# Patient Record
Sex: Female | Born: 1950 | Race: Black or African American | Hispanic: No | Smoking: Never smoker
Health system: Southern US, Community
[De-identification: ages and names within clinical notes are randomized; demographics above are authoritative.]

## PROBLEM LIST (undated history)

## (undated) DIAGNOSIS — I639 Cerebral infarction, unspecified: Secondary | ICD-10-CM

## (undated) DIAGNOSIS — G4730 Sleep apnea, unspecified: Secondary | ICD-10-CM

## (undated) DIAGNOSIS — E119 Type 2 diabetes mellitus without complications: Secondary | ICD-10-CM

## (undated) DIAGNOSIS — L0390 Cellulitis, unspecified: Secondary | ICD-10-CM

## (undated) DIAGNOSIS — I10 Essential (primary) hypertension: Secondary | ICD-10-CM

## (undated) DIAGNOSIS — W19XXXA Unspecified fall, initial encounter: Secondary | ICD-10-CM

## (undated) DIAGNOSIS — N6019 Diffuse cystic mastopathy of unspecified breast: Secondary | ICD-10-CM

## (undated) DIAGNOSIS — I6529 Occlusion and stenosis of unspecified carotid artery: Secondary | ICD-10-CM

## (undated) HISTORY — DX: Sleep apnea, unspecified: G47.30

## (undated) HISTORY — DX: Unspecified fall, initial encounter: W19.XXXA

## (undated) HISTORY — PX: CATARACT EXTRACTION, BILATERAL: SHX1313

## (undated) HISTORY — PX: APPENDECTOMY: SHX54

## (undated) HISTORY — PX: EYE SURGERY: SHX253

## (undated) HISTORY — DX: Essential (primary) hypertension: I10

## (undated) HISTORY — DX: Diffuse cystic mastopathy of unspecified breast: N60.19

## (undated) HISTORY — PX: REFRACTIVE SURGERY: SHX103

## (undated) HISTORY — DX: Cellulitis, unspecified: L03.90

## (undated) HISTORY — PX: OTHER SURGICAL HISTORY: SHX169

## (undated) HISTORY — PX: TUBAL LIGATION: SHX77

## (undated) HISTORY — DX: Occlusion and stenosis of unspecified carotid artery: I65.29

## (undated) HISTORY — DX: Type 2 diabetes mellitus without complications: E11.9

## (undated) HISTORY — PX: BREAST EXCISIONAL BIOPSY: SUR124

---

## 2002-10-08 DIAGNOSIS — I10 Essential (primary) hypertension: Secondary | ICD-10-CM

## 2002-10-08 HISTORY — DX: Essential (primary) hypertension: I10

## 2004-10-08 HISTORY — PX: BIOPSY THYROID: PRO38

## 2005-02-20 ENCOUNTER — Ambulatory Visit: Payer: Self-pay | Admitting: Otolaryngology

## 2005-05-02 ENCOUNTER — Ambulatory Visit: Payer: Self-pay | Admitting: Otolaryngology

## 2005-11-07 ENCOUNTER — Other Ambulatory Visit: Payer: Self-pay

## 2005-11-07 ENCOUNTER — Inpatient Hospital Stay: Payer: Self-pay | Admitting: Internal Medicine

## 2005-11-19 ENCOUNTER — Ambulatory Visit: Payer: Self-pay | Admitting: Family Medicine

## 2005-12-19 ENCOUNTER — Encounter: Payer: Self-pay | Admitting: Family Medicine

## 2006-01-06 ENCOUNTER — Encounter: Payer: Self-pay | Admitting: Family Medicine

## 2006-02-05 ENCOUNTER — Encounter: Payer: Self-pay | Admitting: Family Medicine

## 2006-07-18 ENCOUNTER — Ambulatory Visit: Payer: Self-pay | Admitting: *Deleted

## 2006-10-08 HISTORY — PX: ROTATOR CUFF REPAIR: SHX139

## 2006-11-11 ENCOUNTER — Ambulatory Visit: Payer: Self-pay | Admitting: General Surgery

## 2006-12-19 ENCOUNTER — Ambulatory Visit: Payer: Self-pay | Admitting: Family Medicine

## 2007-01-02 ENCOUNTER — Inpatient Hospital Stay: Payer: Self-pay | Admitting: Internal Medicine

## 2007-01-02 ENCOUNTER — Ambulatory Visit: Payer: Self-pay | Admitting: *Deleted

## 2007-01-13 ENCOUNTER — Ambulatory Visit: Payer: Self-pay | Admitting: *Deleted

## 2007-03-09 LAB — HM COLONOSCOPY: HM Colonoscopy: NORMAL

## 2007-04-17 ENCOUNTER — Ambulatory Visit: Payer: Self-pay | Admitting: Specialist

## 2007-04-24 ENCOUNTER — Ambulatory Visit: Payer: Self-pay | Admitting: Specialist

## 2007-11-13 ENCOUNTER — Ambulatory Visit: Payer: Self-pay | Admitting: General Surgery

## 2008-02-04 ENCOUNTER — Ambulatory Visit: Payer: Self-pay | Admitting: Obstetrics and Gynecology

## 2008-03-10 ENCOUNTER — Ambulatory Visit: Payer: Self-pay | Admitting: Gastroenterology

## 2008-05-14 ENCOUNTER — Emergency Department: Payer: Self-pay | Admitting: Emergency Medicine

## 2008-07-18 ENCOUNTER — Encounter: Admission: RE | Admit: 2008-07-18 | Discharge: 2008-07-18 | Payer: Self-pay | Admitting: Orthopedic Surgery

## 2008-08-16 ENCOUNTER — Ambulatory Visit (HOSPITAL_COMMUNITY): Admission: RE | Admit: 2008-08-16 | Discharge: 2008-08-16 | Payer: Self-pay | Admitting: Orthopedic Surgery

## 2008-12-14 ENCOUNTER — Ambulatory Visit: Payer: Self-pay | Admitting: General Surgery

## 2009-04-21 ENCOUNTER — Ambulatory Visit (HOSPITAL_COMMUNITY): Admission: RE | Admit: 2009-04-21 | Discharge: 2009-04-21 | Payer: Self-pay | Admitting: Orthopedic Surgery

## 2009-06-30 ENCOUNTER — Inpatient Hospital Stay: Payer: Self-pay | Admitting: Internal Medicine

## 2009-07-12 ENCOUNTER — Encounter: Payer: Self-pay | Admitting: Internal Medicine

## 2009-07-29 ENCOUNTER — Emergency Department: Payer: Self-pay | Admitting: Emergency Medicine

## 2009-08-08 HISTORY — PX: KNEE SURGERY: SHX244

## 2009-10-08 HISTORY — PX: COLONOSCOPY: SHX174

## 2009-12-19 ENCOUNTER — Ambulatory Visit: Payer: Self-pay | Admitting: General Surgery

## 2010-04-07 HISTORY — PX: KNEE SURGERY: SHX244

## 2010-12-26 ENCOUNTER — Ambulatory Visit: Payer: Self-pay | Admitting: General Surgery

## 2011-01-14 LAB — URINALYSIS, ROUTINE W REFLEX MICROSCOPIC
Bilirubin Urine: NEGATIVE
Glucose, UA: NEGATIVE mg/dL
Nitrite: NEGATIVE
Protein, ur: NEGATIVE mg/dL
Specific Gravity, Urine: 1.012 (ref 1.005–1.030)
pH: 5 (ref 5.0–8.0)

## 2011-01-14 LAB — PROTIME-INR
INR: 1.1 (ref 0.00–1.49)
Prothrombin Time: 14.1 seconds (ref 11.6–15.2)

## 2011-01-14 LAB — GLUCOSE, CAPILLARY
Glucose-Capillary: 116 mg/dL — ABNORMAL HIGH (ref 70–99)
Glucose-Capillary: 117 mg/dL — ABNORMAL HIGH (ref 70–99)
Glucose-Capillary: 163 mg/dL — ABNORMAL HIGH (ref 70–99)

## 2011-01-14 LAB — CBC
Hemoglobin: 13.1 g/dL (ref 12.0–15.0)
MCV: 91.2 fL (ref 78.0–100.0)
RDW: 13 % (ref 11.5–15.5)

## 2011-01-14 LAB — DIFFERENTIAL
Basophils Relative: 1 % (ref 0–1)
Monocytes Relative: 7 % (ref 3–12)
Neutro Abs: 5.2 10*3/uL (ref 1.7–7.7)

## 2011-01-14 LAB — COMPREHENSIVE METABOLIC PANEL
ALT: 19 U/L (ref 0–35)
BUN: 12 mg/dL (ref 6–23)
Creatinine, Ser: 0.81 mg/dL (ref 0.4–1.2)
GFR calc Af Amer: >60 mL/min (ref >60)
GFR calc non Af Amer: >60 mL/min (ref >60)
Sodium: 139 mEq/L (ref 135–145)
Total Bilirubin: 0.9 mg/dL (ref 0.3–1.2)

## 2011-01-14 LAB — APTT: aPTT: 28 seconds (ref 24–37)

## 2011-02-20 NOTE — Op Note (Signed)
NAMECYAN, CLIPPINGER                 ACCOUNT NO.:  [id number]   MEDICAL RECORD NO.:  [id number]          PATIENT TYPE:  AMB   LOCATION:  SDS                          FACILITY:  [facility]   PHYSICIAN:  Mila Homer. Sherlean Foot, M.D. DATE OF BIRTH:  1951/02/13   DATE OF PROCEDURE:  08/16/2008  DATE OF DISCHARGE:  08/16/2008                               OPERATIVE REPORT   SURGEON:  Mila Homer. Sherlean Foot, MD   ASSISTANT:  Altamese Cabal, PA-C   ANESTHESIA:  MAC.   PREOPERATIVE DIAGNOSIS:  Left knee meniscus tear and osteoarthritis.   POSTOPERATIVE DIAGNOSIS:  Left knee meniscus tear and osteoarthritis.   PROCEDURE:  Left knee partial medial meniscectomy and chondroplasty in  the patellofemoral compartment.   INDICATIONS FOR PROCEDURE:  The patient is 60 year old white female with  mechanical symptoms MRI evidence of meniscus tearing.  Informed consent  was obtained.   DESCRIPTION OF PROCEDURE:  The patient was laid supine, administered MAC  anesthesia, and left leg prepped and draped in the usual sterile  fashion.  Inferolateral and inferomedial portals were created with #11  blade, blunt trocar, and cannula.  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  Chondroplasty was performed with a motorized  shaver.  I then went to flexion.  The medial femoral condyle had some  grade 1 chondromalacia.  There was a complex tear at the posterior horn  of the medial meniscus.  Straight basket forceps and [place] shaver  were used to perform partial medial meniscectomy.  ACL and PCL were  normal.  In the figure-of-4 position we investigated the lateral  compartment, it was normal except for just some fraying degenerative  tearing of the lateral meniscus, which was removed.  I then lavaged and  closed with 4-0 nylon sutures.  Dressed with Xeroform dressing sponges,  sterile Webril, and Ace wrap.   COMPLICATIONS:  None.   DRAINS:  None.           ______________________________  Mila Homer. Sherlean Foot, M.D.     SDL/MEDQ  D:  08/16/2008  T:  08/17/2008  Job:  [id number]

## 2011-02-20 NOTE — Op Note (Signed)
Meagan [name], Meagan [name]                 ACCOUNT NO.:  [id number]   MEDICAL RECORD NO.:  [id number]          PATIENT TYPE:  AMB   LOCATION:  SDS                          FACILITY:  [facility]   PHYSICIAN:  Mila Homer. Sherlean Foot, M.D. DATE OF BIRTH:  1951-04-15   DATE OF PROCEDURE:  04/21/2009  DATE OF DISCHARGE:                               OPERATIVE REPORT   SURGEON:  Mila Homer. Sherlean Foot, MD   ASSISTANT:  None.   ANESTHESIA:  MAC.   PREOPERATIVE DIAGNOSES:  Right knee osteoarthritis and medial and  lateral meniscus tears.   POSTOPERATIVE DIAGNOSES:  Right knee osteoarthritis and medial and  lateral meniscus tears.   INDICATIONS FOR PROCEDURE:  The patient is a 60 year old black female  with mechanical symptoms.  MRI evidence of meniscal tear and  radiographic evidence of OA.  Informed consent obtained.   DESCRIPTION OF PROCEDURE:  The patient was laid supine, administered MAC  anesthesia, right leg prepped and draped in usual sterile fashion.  Inferolateral and inferomedial portals were created with #11 blade,  blunt trocar, and cannula.  Diagnostic arthroscopy revealed  chondromalacia of the patella grade 1 went into the medial compartment,  she had 1 area in the weightbearing surface of grade 3 and 4  chondromalacia with unstable articular cartilage.  An aggressive  chondroplasty was performed with Gator Large shaver.  I then performed a  microfracture with 45-degree microfracture awl.  The bone was very very  soft, and I could actually put it an half of the drill holes just  pushing by hand.  There was tearing of the posterior horn of the  meniscus.  Straight basket forceps and Gator Large shaver were used to  perform an aggressive partial medial meniscectomy.  I then went to the  notch, ACL and PCL were normal.  Then went to the lateral compartment,  there was only some grade 1 chondromalacia on the tibia.  There was  degenerative tearing throughout the entirety of the lateral meniscus.   I  cleaned this up by performing a partial lateral meniscectomy with  straight basket forceps and Gator Large  shaver.  I then lavaged and  closed with 4-0 nylon sutures, dressed with Xeroform dressing, sponges,  sterile Webril, and Ace wrap.   COMPLICATIONS:  None.   DRAINS:  None.           ______________________________  Mila Homer. Sherlean Foot, M.D.     SDL/MEDQ  D:  04/21/2009  T:  04/22/2009  Job:  [id number]

## 2011-02-23 NOTE — Op Note (Signed)
NAMETIFANY, HIRSCH                 ACCOUNT NO.:  [id number]   MEDICAL RECORD NO.:  [id number]          PATIENT TYPE:  AMB   LOCATION:  SDS                          FACILITY:  [facility]   PHYSICIAN:  Mila Homer. Sherlean Foot, M.D. DATE OF BIRTH:  Feb 01, 1951   DATE OF PROCEDURE:  DATE OF DISCHARGE:  08/16/2008                               OPERATIVE REPORT   ADDENDUM:  In the operative report, it states in the indications for procedure that  this is a 60 year old white female and in fact this is a 60 year old  black female patient, wanted this change.  Please go into the record and  change it and everything else is same.           ______________________________  Mila Homer Sherlean Foot, M.D.     SDL/MEDQ  D:  11/09/2008  T:  11/10/2008  Job:  [id number]

## 2011-07-10 LAB — COMPREHENSIVE METABOLIC PANEL
Alkaline Phosphatase: 83
GFR calc Af Amer: >60
GFR calc non Af Amer: >60
Potassium: 3.9

## 2011-07-10 LAB — URINALYSIS, ROUTINE W REFLEX MICROSCOPIC
Bilirubin Urine: NEGATIVE
Nitrite: NEGATIVE
Protein, ur: NEGATIVE
Urobilinogen, UA: 1

## 2011-07-10 LAB — DIFFERENTIAL
Basophils Relative: 1
Eosinophils Absolute: 0.2
Eosinophils Relative: 2
Lymphocytes Relative: 26
Monocytes Relative: 7
Neutro Abs: 6.7
Neutrophils Relative %: 64

## 2011-07-10 LAB — GLUCOSE, CAPILLARY: Glucose-Capillary: 119 — ABNORMAL HIGH

## 2011-07-10 LAB — CBC
HCT: 37.5
Platelets: 384
RDW: 13.6

## 2011-10-09 DIAGNOSIS — W19XXXA Unspecified fall, initial encounter: Secondary | ICD-10-CM

## 2011-10-09 HISTORY — DX: Unspecified fall, initial encounter: W19.XXXA

## 2011-12-27 ENCOUNTER — Ambulatory Visit: Payer: Self-pay | Admitting: General Surgery

## 2012-01-02 ENCOUNTER — Ambulatory Visit: Payer: Self-pay | Admitting: Family Medicine

## 2012-04-28 LAB — HM PAP SMEAR: HM Pap smear: NORMAL

## 2012-09-28 ENCOUNTER — Emergency Department: Payer: Self-pay | Admitting: Emergency Medicine

## 2012-11-16 ENCOUNTER — Encounter: Payer: Self-pay | Admitting: *Deleted

## 2012-11-16 DIAGNOSIS — I10 Essential (primary) hypertension: Secondary | ICD-10-CM | POA: Insufficient documentation

## 2012-11-16 DIAGNOSIS — I779 Disorder of arteries and arterioles, unspecified: Secondary | ICD-10-CM | POA: Insufficient documentation

## 2012-11-16 DIAGNOSIS — I6529 Occlusion and stenosis of unspecified carotid artery: Secondary | ICD-10-CM | POA: Insufficient documentation

## 2012-11-16 DIAGNOSIS — L0390 Cellulitis, unspecified: Secondary | ICD-10-CM | POA: Insufficient documentation

## 2013-01-15 ENCOUNTER — Ambulatory Visit: Payer: Self-pay | Admitting: General Surgery

## 2013-01-22 ENCOUNTER — Ambulatory Visit: Payer: Self-pay | Admitting: General Surgery

## 2013-01-26 ENCOUNTER — Encounter: Payer: Self-pay | Admitting: *Deleted

## 2013-01-26 ENCOUNTER — Ambulatory Visit: Payer: Self-pay | Admitting: General Surgery

## 2013-01-27 ENCOUNTER — Encounter: Payer: Self-pay | Admitting: General Surgery

## 2013-01-27 NOTE — Progress Notes (Signed)
Quick Note:  Make sure the additional views are done prior to her office visit ______ 

## 2013-01-28 NOTE — Progress Notes (Signed)
Patient had additional views completed at [facility] on 01-26-13.

## 2013-01-29 ENCOUNTER — Ambulatory Visit (INDEPENDENT_AMBULATORY_CARE_PROVIDER_SITE_OTHER): Payer: BC Managed Care – PPO | Admitting: General Surgery

## 2013-01-29 ENCOUNTER — Encounter: Payer: Self-pay | Admitting: General Surgery

## 2013-01-29 ENCOUNTER — Other Ambulatory Visit: Payer: Self-pay | Admitting: *Deleted

## 2013-01-29 VITALS — BP 122/82 | HR 76 | Resp 12 | Ht 64.0 in | Wt 234.0 lb

## 2013-01-29 DIAGNOSIS — Z1231 Encounter for screening mammogram for malignant neoplasm of breast: Secondary | ICD-10-CM

## 2013-01-29 DIAGNOSIS — N6019 Diffuse cystic mastopathy of unspecified breast: Secondary | ICD-10-CM

## 2013-01-29 DIAGNOSIS — I6529 Occlusion and stenosis of unspecified carotid artery: Secondary | ICD-10-CM

## 2013-01-29 NOTE — Patient Instructions (Addendum)
Return for here carotid u/s tomorrow

## 2013-01-29 NOTE — Progress Notes (Signed)
Patient will be asked to return to the office in one year for a bilateral screening mammogram. 

## 2013-01-29 NOTE — Progress Notes (Signed)
Patient ID: Meagan [name], female   DOB: 02-Mar-1951, 62 y.o.   MRN: [id number]  Chief Complaint  Patient presents with  . Other    mammogram    HP Meagan [name] is a 62 y.o. female who presents for follow up mammogram on 01/07/13 cat 2..Patient states no new breast problems. She also has mild asymptomatic carotid stenosis. HPI  Past Medical History  Diagnosis Date  . Occlusion and stenosis of carotid artery without mention of cerebral infarction   . Hypertension 2004  . Diffuse cystic mastopathy   . Cellulitis     left leg  . 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 Knee surgery Right 2010  . Rotator cuff repair  2008  . Breast biopsy    . Carpal tunnel release    . Tubal ligation    . Ganglion cyst removal     . Appendectomy    . Colonoscopy  2011    DR.WOHL    History reviewed. No pertinent family history.  Social History History  Substance Use Topics  . Smoking status: Never Smoker   . Smokeless tobacco: Never Used  . Alcohol Use: No    Allergies  Allergen Reactions  . Keflex (Cephalexin) Rash    Current Outpatient Prescriptions  Medication Sig Dispense Refill  . aspirin 81 MG tablet Take 81 mg by mouth daily.      . pravastatin (PRAVACHOL) 20 MG tablet Take 20 mg by mouth daily.      Marland Kitchen telmisartan (MICARDIS) 20 MG tablet Take 20 mg by mouth daily.      Marland Kitchen MICARDIS HCT 80-25 MG per tablet Take 80 tablets by mouth daily.       No current facility-administered medications for this visit.    Review of Systems Review of Systems  Constitutional: Negative.   Respiratory: Negative.   Cardiovascular: Negative.   Gastrointestinal: Negative.     Blood pressure 122/82, pulse 76, resp. rate 12, height 5\' 4"  (1.626 m), weight 234 lb (106.142 kg).  Physical Exam Physical Exam  Constitutional: She is oriented to person, place, and time. She appears well-developed and well-nourished.   Neck: Normal range of motion. Neck supple. Carotid bruit is not present.  Cardiovascular: Normal rate, regular rhythm and normal heart sounds.   Pulses:      Carotid pulses are 2+ on the right side, and 2+ on the left side.      Dorsalis pedis pulses are 2+ on the right side, and 2+ on the left side.       Posterior tibial pulses are 2+ on the right side, and 2+ on the left side.  No leg edema  Pulmonary/Chest: Effort normal and breath sounds normal. Right breast exhibits no inverted nipple, no mass, no nipple discharge, no skin change and no tenderness. Left breast exhibits no inverted nipple, no mass, no nipple discharge, no skin change and no tenderness.  Abdominal: Soft. Bowel sounds are normal.  Lymphadenopathy:    She has no cervical adenopathy.    She has no axillary adenopathy.  Neurological: She is alert and oriented to person, place, and time.    Data Reviewed Mammogram reviewed-BIRADS 2  Assessment    Stable exam.     Plan    2yr f/u with mammogram.        Meagan [name] G 01/30/2013, 6:34 AM

## 2013-01-30 ENCOUNTER — Encounter: Payer: Self-pay | Admitting: General Surgery

## 2013-01-30 ENCOUNTER — Ambulatory Visit (INDEPENDENT_AMBULATORY_CARE_PROVIDER_SITE_OTHER): Payer: BC Managed Care – PPO | Admitting: General Surgery

## 2013-01-30 DIAGNOSIS — I6529 Occlusion and stenosis of unspecified carotid artery: Secondary | ICD-10-CM

## 2013-01-30 NOTE — Patient Instructions (Addendum)
Patient to return in 1 year for follow up carotid ultrasound.  

## 2013-01-30 NOTE — Progress Notes (Signed)
Patient ID: Meagan [name], female   DOB: 03-03-1951, 61 y.o.   MRN: [id number]  The patient presents today for a carotid ultrasound. Performed and reported.  Bilateral carotid Duplex study was performed.  On right side there is focal mild palquing noted in ICA origin, 1.71mm thick. No flow alterations noted.  There is also a similar plaque in origin of ICA. Left side with no abnormal findings. Both bifurcations are high. Right vertebral has antegrade flow. Left side was not clearly seen. ICA/CCA ratio less than 1 on both sides. Im[ression_ stable mild plaquing in right carotid bifurcation, no stenosis by doppler criteria, 25% or less anatomical.

## 2013-01-31 ENCOUNTER — Encounter: Payer: Self-pay | Admitting: General Surgery

## 2013-02-11 ENCOUNTER — Encounter: Payer: Self-pay | Admitting: General Surgery

## 2013-12-06 HISTORY — PX: CARPAL TUNNEL RELEASE: SHX101

## 2014-01-25 ENCOUNTER — Ambulatory Visit: Payer: Self-pay | Admitting: General Surgery

## 2014-01-25 LAB — HM MAMMOGRAPHY: HM MAMMO: NORMAL

## 2014-02-02 ENCOUNTER — Other Ambulatory Visit: Payer: BC Managed Care – PPO

## 2014-02-02 ENCOUNTER — Ambulatory Visit (INDEPENDENT_AMBULATORY_CARE_PROVIDER_SITE_OTHER): Payer: BC Managed Care – PPO | Admitting: General Surgery

## 2014-02-02 ENCOUNTER — Encounter: Payer: Self-pay | Admitting: General Surgery

## 2014-02-02 VITALS — BP 112/64 | HR 80 | Resp 12 | Ht 64.5 in | Wt 220.0 lb

## 2014-02-02 DIAGNOSIS — I6529 Occlusion and stenosis of unspecified carotid artery: Secondary | ICD-10-CM

## 2014-02-02 DIAGNOSIS — Z9889 Other specified postprocedural states: Secondary | ICD-10-CM

## 2014-02-02 NOTE — Patient Instructions (Addendum)
Continue self breast exams. Call office for any new breast issues or concerns. Follow up in one year with bilateral screening mammogram and office visit with an office carotid ultrasound.

## 2014-02-02 NOTE — Progress Notes (Signed)
Patient ID: Meagan [name], female   DOB: 12/09/1950, 63 y.o.   MRN: [id number]  Chief Complaint  Patient presents with  . Follow-up    mammogram and carotid ultrasound     HPI Meagan [name] is a 63 y.o. female.  who presents for her follow up mammogram and breast evaluation. The most recent mammogram was done on 01-25-14.  Patient does perform regular self breast checks and gets regular mammograms done.   She is also here to have a carotid ultrasound.  HPI  Past Medical History  Diagnosis Date  . Occlusion and stenosis of carotid artery without mention of cerebral infarction   . Hypertension 2004  . Diffuse cystic mastopathy   . Cellulitis     left leg  . 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 Fall 2013    Past Surgical History  Procedure Laterality Date  . Knee surgery Right 2010  . Rotator cuff repair  2008  . Breast biopsy    . Carpal tunnel release Left March 2015  . Tubal ligation    . Ganglion cyst removal     . Appendectomy    . Colonoscopy  2011    DR.WOHL  . Elbow surgery  March 2015    History reviewed. No pertinent family history.  Social History History  Substance Use Topics  . Smoking status: Never Smoker   . Smokeless tobacco: Never Used  . Alcohol Use: No    Allergies  Allergen Reactions  . Keflex [Cephalexin] Rash    Current Outpatient Prescriptions  Medication Sig Dispense Refill  . aspirin 81 MG tablet Take 81 mg by mouth daily.      Marland Kitchen FARXIGA 10 MG TABS daily.       Marland Kitchen KOMBIGLYZE XR 2.02-999 MG TB24 daily.       Marland Kitchen LEVEMIR FLEXTOUCH 100 UNIT/ML Pen Inject 15 Units into the skin daily at 10 pm.       . ONE TOUCH ULTRA TEST test strip       . oxyCODONE (OXY IR/ROXICODONE) 5 MG immediate release tablet Take 5 mg by mouth.       . pravastatin (PRAVACHOL) 20 MG tablet Take 20 mg by mouth daily.      Marland Kitchen telmisartan (MICARDIS) 20 MG tablet Take 20 mg by mouth daily.       No current facility-administered medications  for this visit.    Review of Systems Review of Systems  Constitutional: Negative.   Respiratory: Negative.   Cardiovascular: Negative.   Neurological: Negative for dizziness and light-headedness.    Blood pressure 112/64, pulse 80, resp. rate 12, height 5' 4.5" (1.638 m), weight 220 lb (99.791 kg).  Physical Exam Physical Exam  Constitutional: She is oriented to person, place, and time. She appears well-developed and well-nourished.  Eyes: Conjunctivae are normal.  Neck: Neck supple. Carotid bruit is not present.  Cardiovascular: Normal rate, regular rhythm and normal heart sounds.   Pulmonary/Chest: Effort normal and breath sounds normal. Right breast exhibits no inverted nipple, no mass, no nipple discharge, no skin change and no tenderness. Left breast exhibits no inverted nipple, no mass, no nipple discharge, no skin change and no tenderness.  Abdominal: Soft. Bowel sounds are normal. There is no hepatosplenomegaly. There is no tenderness. No hernia.  Lymphadenopathy:    She has no cervical adenopathy.    She has no axillary adenopathy.  Neurological: She is alert and oriented to person, place, and time.  Skin: Skin is warm and dry.    Data Reviewed Mammogram reviewed and stable. Carotid doppler performed today- no new findings, small plaque in right carotid.  Assessment    Stable physical exam.    Plan    Follow up in one year with bilateral screening mammogram and office visit with an office carotid ultrasound.       Meagan [name] Meagan [name] 02/03/2014, 6:24 AM

## 2014-02-03 ENCOUNTER — Encounter: Payer: Self-pay | Admitting: General Surgery

## 2014-08-09 ENCOUNTER — Encounter: Payer: Self-pay | Admitting: General Surgery

## 2015-02-02 ENCOUNTER — Encounter: Payer: Self-pay | Admitting: General Surgery

## 2015-02-09 ENCOUNTER — Ambulatory Visit (INDEPENDENT_AMBULATORY_CARE_PROVIDER_SITE_OTHER): Payer: BC Managed Care – PPO | Admitting: General Surgery

## 2015-02-09 ENCOUNTER — Encounter: Payer: Self-pay | Admitting: General Surgery

## 2015-02-09 ENCOUNTER — Ambulatory Visit: Payer: BC Managed Care – PPO

## 2015-02-09 VITALS — BP 130/72 | HR 70 | Resp 14 | Ht 64.5 in | Wt 217.0 lb

## 2015-02-09 DIAGNOSIS — Z9889 Other specified postprocedural states: Secondary | ICD-10-CM

## 2015-02-09 DIAGNOSIS — I6529 Occlusion and stenosis of unspecified carotid artery: Secondary | ICD-10-CM | POA: Diagnosis not present

## 2015-02-09 NOTE — Patient Instructions (Signed)
Patient will be asked to return to the office in one year with a bilateral screening mammogram.  Continue self breast exams. Call office for any new breast issues or concerns.  

## 2015-02-09 NOTE — Progress Notes (Signed)
Patient ID: Meagan [name], female   DOB: 1951-02-03, 64 y.o.   MRN: [id number]  Chief Complaint  Patient presents with  . Follow-up    HPI Meagan [name] is a 64 y.o. female.  who presents for her follow up mammogram, breast evaluation and carotid ultrasound . The most recent mammogram was done on 02-02-15.  Patient does perform regular self breast checks and gets regular mammograms done.  No new breast issues. Denies any dizziness.   HPI  Past Medical History  Diagnosis Date  . Occlusion and stenosis of carotid artery without mention of cerebral infarction   . Hypertension 2004  . Diffuse cystic mastopathy   . Cellulitis     left leg  . 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 Fall 2013    Past Surgical History  Procedure Laterality Date  . Knee surgery Right 2010  . Rotator cuff repair  2008  . Breast biopsy    . Carpal tunnel release Left March 2015  . Tubal ligation    . Ganglion cyst removal     . Appendectomy    . Colonoscopy  2011    DR.WOHL  . Elbow surgery  March 2015    History reviewed. No pertinent family history.  Social History History  Substance Use Topics  . Smoking status: Never Smoker   . Smokeless tobacco: Never Used  . Alcohol Use: No    Allergies  Allergen Reactions  . Keflex [Cephalexin] Rash    Current Outpatient Prescriptions  Medication Sig Dispense Refill  . aspirin 81 MG tablet Take 81 mg by mouth daily.    Marland Kitchen FARXIGA 10 MG TABS daily.     Marland Kitchen KOMBIGLYZE XR 2.02-999 MG TB24 daily.     Marland Kitchen LEVEMIR FLEXTOUCH 100 UNIT/ML Pen Inject 15 Units into the skin daily at 10 pm.     . ONE TOUCH ULTRA TEST test strip     . pravastatin (PRAVACHOL) 20 MG tablet Take 20 mg by mouth daily.    Marland Kitchen telmisartan (MICARDIS) 20 MG tablet Take 20 mg by mouth daily.     No current facility-administered medications for this visit.    Review of Systems Review of Systems  Constitutional: Negative.   Respiratory: Negative.    Cardiovascular: Negative.   Neurological: Negative for light-headedness.    Blood pressure 130/72, pulse 70, resp. rate 14, height 5' 4.5" (1.638 m), weight 98.431 kg (217 lb).  Physical Exam Physical Exam  Constitutional: She is oriented to person, place, and time. She appears well-developed and well-nourished.  Eyes: Conjunctivae are normal. No scleral icterus.  Neck: Neck supple. Carotid bruit is not present.  Cardiovascular: Normal rate, regular rhythm and normal heart sounds.   Pulmonary/Chest: Effort normal and breath sounds normal. Right breast exhibits no inverted nipple, no mass, no nipple discharge, no skin change and no tenderness. Left breast exhibits no inverted nipple, no mass, no nipple discharge, no skin change and no tenderness.  Abdominal: Soft. There is no tenderness.  Lymphadenopathy:    She has no cervical adenopathy.    She has no axillary adenopathy.  Neurological: She is alert and oriented to person, place, and time.  Skin: Skin is warm and dry.    Data Reviewed Mammogram reviewed and stable. Carotid doppler was performed and showed stable mild to moderate plaquing, right>left Assessment    Stable physical exam.    Plan    Patient will be asked to return to the  office in one year with a bilateral screening mammogram and office carotid ultrasound. Continue self breast exams. Call office for any new breast issues or concerns.      PCP:  Terance Hart 02/14/2015, 5:34 PM

## 2015-02-14 ENCOUNTER — Encounter: Payer: Self-pay | Admitting: General Surgery

## 2015-03-03 LAB — LIPID PANEL
Cholesterol: 201 mg/dL — AB (ref 0–200)
HDL: 32 mg/dL — AB (ref 35–70)
LDL Cholesterol: 131 mg/dL
Triglycerides: 191 mg/dL — AB (ref 40–160)

## 2015-03-31 ENCOUNTER — Telehealth: Payer: Self-pay | Admitting: Family Medicine

## 2015-03-31 NOTE — Telephone Encounter (Signed)
PT SAID THAT HER LAST VISIT THE DR WAS PUTTING HER ON METFORMIN BUT WHEN SHE WENT TO THE PHARM IT IS NOTHING THERE. [facility] IN [place].

## 2015-04-01 MED ORDER — METFORMIN HCL 1000 MG PO TABS: 1000.0000 mg | ORAL_TABLET | Freq: Two times a day (BID) | ORAL | Status: DC

## 2015-04-01 NOTE — Telephone Encounter (Signed)
I am sorry, sent now for 1000mg  twice daily, please remind her not to take [facility]

## 2015-09-02 ENCOUNTER — Other Ambulatory Visit: Payer: Self-pay | Admitting: Family Medicine

## 2015-09-05 NOTE — Telephone Encounter (Signed)
Appointment made for December and patient was informed of prescription being called in to pharmacy.

## 2015-09-19 ENCOUNTER — Other Ambulatory Visit: Payer: Self-pay | Admitting: Family Medicine

## 2015-09-19 NOTE — Telephone Encounter (Signed)
Patient requesting refill. 

## 2015-09-30 ENCOUNTER — Ambulatory Visit: Payer: Self-pay | Admitting: Family Medicine

## 2015-10-03 ENCOUNTER — Other Ambulatory Visit: Payer: Self-pay | Admitting: Family Medicine

## 2015-10-06 ENCOUNTER — Ambulatory Visit (INDEPENDENT_AMBULATORY_CARE_PROVIDER_SITE_OTHER): Payer: BC Managed Care – PPO | Admitting: Family Medicine

## 2015-10-06 ENCOUNTER — Encounter: Payer: Self-pay | Admitting: Family Medicine

## 2015-10-06 VITALS — BP 144/96 | HR 96 | Temp 98.1°F | Resp 18 | Ht 65.0 in | Wt 219.2 lb

## 2015-10-06 DIAGNOSIS — H6981 Other specified disorders of Eustachian tube, right ear: Secondary | ICD-10-CM | POA: Diagnosis not present

## 2015-10-06 DIAGNOSIS — E785 Hyperlipidemia, unspecified: Secondary | ICD-10-CM | POA: Insufficient documentation

## 2015-10-06 DIAGNOSIS — Z794 Long term (current) use of insulin: Secondary | ICD-10-CM

## 2015-10-06 DIAGNOSIS — E1140 Type 2 diabetes mellitus with diabetic neuropathy, unspecified: Secondary | ICD-10-CM | POA: Insufficient documentation

## 2015-10-06 DIAGNOSIS — E1121 Type 2 diabetes mellitus with diabetic nephropathy: Secondary | ICD-10-CM | POA: Diagnosis not present

## 2015-10-06 DIAGNOSIS — E041 Nontoxic single thyroid nodule: Secondary | ICD-10-CM | POA: Insufficient documentation

## 2015-10-06 DIAGNOSIS — E1165 Type 2 diabetes mellitus with hyperglycemia: Secondary | ICD-10-CM | POA: Diagnosis not present

## 2015-10-06 DIAGNOSIS — IMO0002 Reserved for concepts with insufficient information to code with codable children: Secondary | ICD-10-CM | POA: Insufficient documentation

## 2015-10-06 DIAGNOSIS — Z23 Encounter for immunization: Secondary | ICD-10-CM | POA: Diagnosis not present

## 2015-10-06 DIAGNOSIS — G4733 Obstructive sleep apnea (adult) (pediatric): Secondary | ICD-10-CM | POA: Diagnosis not present

## 2015-10-06 DIAGNOSIS — E119 Type 2 diabetes mellitus without complications: Secondary | ICD-10-CM

## 2015-10-06 DIAGNOSIS — I10 Essential (primary) hypertension: Secondary | ICD-10-CM

## 2015-10-06 DIAGNOSIS — H35039 Hypertensive retinopathy, unspecified eye: Secondary | ICD-10-CM | POA: Insufficient documentation

## 2015-10-06 DIAGNOSIS — G8929 Other chronic pain: Secondary | ICD-10-CM | POA: Insufficient documentation

## 2015-10-06 DIAGNOSIS — M542 Cervicalgia: Secondary | ICD-10-CM

## 2015-10-06 DIAGNOSIS — Z8701 Personal history of pneumonia (recurrent): Secondary | ICD-10-CM | POA: Insufficient documentation

## 2015-10-06 LAB — POCT UA - MICROALBUMIN: MICROALBUMIN (UR) POC: 100 mg/L

## 2015-10-06 LAB — POCT GLYCOSYLATED HEMOGLOBIN (HGB A1C): HEMOGLOBIN A1C: 10.9

## 2015-10-06 MED ORDER — PRAVASTATIN SODIUM 20 MG PO TABS: 20.0000 mg | ORAL_TABLET | Freq: Every day | ORAL | Status: DC

## 2015-10-06 MED ORDER — CANAGLIFLOZIN 300 MG PO TABS
300.0000 mg | ORAL_TABLET | Freq: Every day | ORAL | Status: DC
Start: 2015-10-06 — End: 2015-12-06

## 2015-10-06 MED ORDER — KOMBIGLYZE XR 2.5-1000 MG PO TB24: 2.0000 | ORAL_TABLET | Freq: Every day | ORAL | Status: DC

## 2015-10-06 MED ORDER — FLUTICASONE PROPIONATE 50 MCG/ACT NA SUSP: 2.0000 | Freq: Every day | NASAL | Status: DC

## 2015-10-06 MED ORDER — LOSARTAN POTASSIUM-HCTZ 100-25 MG PO TABS: 1.0000 | ORAL_TABLET | Freq: Every day | ORAL | Status: DC

## 2015-10-06 MED ORDER — INSULIN DETEMIR 100 UNIT/ML FLEXPEN: 15.0000 [IU] | PEN_INJECTOR | Freq: Every day | SUBCUTANEOUS | Status: DC

## 2015-10-06 NOTE — Progress Notes (Signed)
Name: Meagan [name]   MRN: [id number]    DOB: 1951-02-27   Date:10/06/2015       Progress Note  Subjective  Chief Complaint  Chief Complaint  Patient presents with  . Medication Refill    follow-up  . Diabetes    checks glucose 1-2 x day low-147, high-279  . Hypertension  . Hyperlipidemia    HPI  DMII: she has not been following a diabetic diet. She is taking her medications. She has not gained weight. Her hgbA1C is even higher than before. She has neuropathy and also proteinuria. She is on ARB but we need to change medication because of cost. Taking gabapentin for neuropathy and she states symptoms are better controlled. She denies recent yeast infections. She is due for eye exam and she will schedule it herself. She denies polyphagia, polydipsia or polyuria or nocturia.   HTN: she did not take her medications today, bp is elevated, insurance will no longer cover Telmisartan so we will change it to Losartan. No chest pain, no palpation  Hyperlipidemia: she has been taking Pravastatin and denies myalgia  Obesity: she states her daughter will have a sleeve procedure soon, and she will follow her daughter's diet, she is willing to lose weight .   OSA: she is wearing her CPAP machine every night, she denies morning headaches but still feels tired during the day, but not daily    Patient Active Problem List   Diagnosis Date Noted  . OSA (obstructive sleep apnea) 10/06/2015  . Hypertensive retinopathy 10/06/2015  . Thyroid cyst 10/06/2015  . Dyslipidemia 10/06/2015  . Type 2 diabetes, uncontrolled, with neuropathy ([place]) 10/06/2015  . Type 2 diabetes with nephropathy ([place]) 10/06/2015  . History of pneumonia 10/06/2015  . Chronic neck pain 10/06/2015  . Occlusion and stenosis of carotid artery without mention of cerebral infarction   . Hypertension, benign     Past Surgical History  Procedure Laterality Date  . Knee surgery Right 2010  . Rotator cuff repair  2008  . Breast  biopsy    . Carpal tunnel release Left March 2015  . Tubal ligation    . Ganglion cyst removal     . Appendectomy    . Colonoscopy  2011    DR.[facility]  . Elbow surgery  March 2015    History reviewed. No pertinent family history.  Social History   Social History  . Marital Status: Widowed    Spouse Name: N/A  . Number of Children: N/A  . Years of Education: N/A   Occupational History  . Not on file.   Social History Main Topics  . Smoking status: Never Smoker   . Smokeless tobacco: Never Used  . Alcohol Use: No  . Drug Use: No  . Sexual Activity: Not on file   Other Topics Concern  . Not on file   Social History Narrative     Current outpatient prescriptions:  .  aspirin 81 MG tablet, Take 81 mg by mouth daily., Disp: , Rfl:  .  canagliflozin (INVOKANA) 300 MG TABS tablet, Take 300 mg by mouth daily before breakfast., Disp: 30 tablet, Rfl: 2 .  FARXIGA 10 MG TABS tablet, TAKE 1 TABLET EVERY DAY, Disp: 30 tablet, Rfl: 0 .  Insulin Detemir (LEVEMIR FLEXTOUCH) 100 UNIT/ML Pen, Inject 15 Units into the skin daily at 10 pm., Disp: 15 mL, Rfl: 2 .  KOMBIGLYZE XR 2.02-999 MG TB24, Take 2 tablets by mouth daily., Disp: 60 tablet, Rfl: 2 .  losartan-hydrochlorothiazide (HYZAAR) 100-25 MG tablet, Take 1 tablet by mouth daily., Disp: 30 tablet, Rfl: 2 .  ONE TOUCH ULTRA TEST test strip, , Disp: , Rfl:  .  pravastatin (PRAVACHOL) 20 MG tablet, Take 1 tablet (20 mg total) by mouth daily., Disp: 30 tablet, Rfl: 2  Allergies  Allergen Reactions  . Keflex [Cephalexin] Rash     ROS  Constitutional: Negative for fever or weight change.  Respiratory: Negative for cough and shortness of breath.   Cardiovascular: Negative for chest pain or palpitations.  Gastrointestinal: Negative for abdominal pain, no bowel changes.  Musculoskeletal: Negative for gait problem or joint swelling.  Skin: Negative for rash.  Neurological: Negative for dizziness or headache.  No other specific  complaints in a complete review of systems (except as listed in HPI above).  Objective  Filed Vitals:   10/06/15 1512  BP: 144/96  Pulse: 96  Temp: 98.1 F (36.7 C)  TempSrc: Oral  Resp: 18  Height: 5\' 5"  (1.651 m)  Weight: 219 lb 3.2 oz (99.428 kg)  SpO2: 96%    Body mass index is 36.48 kg/(m^2).  Physical Exam  Constitutional: Patient appears well-developed and well-nourished. Obese  No distress.  HEENT: head atraumatic, normocephalic, pupils equal and reactive to light, neck supple, throat within normal limits Cardiovascular: Normal rate, regular rhythm and normal heart sounds.  No murmur heard. No BLE edema. Pulmonary/Chest: Effort normal and breath sounds normal. No respiratory distress. Abdominal: Soft.  There is no tenderness. Psychiatric: Patient has a normal mood and affect. behavior is normal. Judgment and thought content normal.  Recent Results (from the past 2160 hour(s))  POCT HgB A1C     Status: Abnormal   Collection Time: 10/06/15  3:18 PM  Result Value Ref Range   Hemoglobin A1C 10.9     Diabetic Foot Exam: Diabetic Foot Exam - Simple   Simple Foot Form  Diabetic Foot exam was performed with the following findings:  Yes 10/06/2015  3:45 PM  Visual Inspection  No deformities, no ulcerations, no other skin breakdown bilaterally:  Yes  Sensation Testing  Intact to touch and monofilament testing bilaterally:  Yes  Pulse Check  Posterior Tibialis and Dorsalis pulse intact bilaterally:  Yes  Comments  Some dryness      PHQ2/9: Depression screen PHQ 2/9 10/06/2015  Decreased Interest 0  Down, Depressed, Hopeless 0  PHQ - 2 Score 0     Fall Risk: Fall Risk  10/06/2015  Falls in the past year? No     Functional Status Survey: Is the patient deaf or have difficulty hearing?: No Does the patient have difficulty seeing, even when wearing glasses/contacts?: Yes (glasses) Does the patient have difficulty concentrating, remembering, or making  decisions?: No Does the patient have difficulty walking or climbing stairs?: No Does the patient have difficulty dressing or bathing?: No Does the patient have difficulty doing errands alone such as visiting a doctor's office or shopping?: No    Assessment & Plan  1. Type 2 diabetes mellitus without complication, with long-term current use of insulin ([place])  She refuses to change medication at this time, she will try diet again, advised to at least increase levemir to 30 units daily but she refuses also - POCT UA - Microalbuminemir to 30 units - POCT HgB A1C - Insulin Detemir (LEVEMIR FLEXTOUCH) 100 UNIT/ML Pen; Inject 15 Units into the skin daily at 10 pm.  Dispense: 15 mL; Refill: 2 - KOMBIGLYZE XR 2.02-999 MG TB24; Take 2 tablets  by mouth daily.  Dispense: 60 tablet; Refill: 2 - canagliflozin (INVOKANA) 300 MG TABS tablet; Take 300 mg by mouth daily before breakfast.  Dispense: 30 tablet; Refill: 2  2. Needs flu shot  - Flu Vaccine QUAD 36+ mos PF IM (Fluarix & Fluzone Quad PF) -refused  3. Need for zoster vaccination  - Varicella-zoster vaccine subcutaneous -refuses  4. OSA (obstructive sleep apnea)  Continue wearing CPAP daily   5. Dyslipidemia  - pravastatin (PRAVACHOL) 20 MG tablet; Take 1 tablet (20 mg total) by mouth daily.  Dispense: 30 tablet; Refill: 2  6. Type 2 diabetes, uncontrolled, with neuropathy ([facility])  - Insulin Detemir (LEVEMIR FLEXTOUCH) 100 UNIT/ML Pen; Inject 15 Units into the skin daily at 10 pm.  Dispense: 15 mL; Refill: 2 - KOMBIGLYZE XR 2.02-999 MG TB24; Take 2 tablets by mouth daily.  Dispense: 60 tablet; Refill: 2 - canagliflozin (INVOKANA) 300 MG TABS tablet; Take 300 mg by mouth daily before breakfast.  Dispense: 30 tablet; Refill: 2  7. Type 2 diabetes with nephropathy ([facility])  - Insulin Detemir (LEVEMIR FLEXTOUCH) 100 UNIT/ML Pen; Inject 15 Units into the skin daily at 10 pm.  Dispense: 15 mL; Refill: 2 - KOMBIGLYZE XR 2.02-999 MG TB24; Take 2  tablets by mouth daily.  Dispense: 60 tablet; Refill: 2 - canagliflozin (INVOKANA) 300 MG TABS tablet; Take 300 mg by mouth daily before breakfast.  Dispense: 30 tablet; Refill: 2  8. Hypertension, benign  - losartan-hydrochlorothiazide (HYZAAR) 100-25 MG tablet; Take 1 tablet by mouth daily.  Dispense: 30 tablet; Refill: 2  9. Need for vaccination for Strep pneumoniae  - Pneumococcal conjugate vaccine 13-valent IM -refused   10. Eustachian tube dysfunction, right  - fluticasone (FLONASE) 50 MCG/ACT nasal spray; Place 2 sprays into both nostrils daily.  Dispense: 16 g; Refill: 0

## 2015-10-14 ENCOUNTER — Telehealth: Payer: Self-pay

## 2015-10-14 DIAGNOSIS — E1165 Type 2 diabetes mellitus with hyperglycemia: Principal | ICD-10-CM

## 2015-10-14 DIAGNOSIS — E1140 Type 2 diabetes mellitus with diabetic neuropathy, unspecified: Secondary | ICD-10-CM

## 2015-10-14 DIAGNOSIS — IMO0002 Reserved for concepts with insufficient information to code with codable children: Secondary | ICD-10-CM

## 2015-10-14 MED ORDER — SITAGLIPTIN PHOS-METFORMIN HCL 50-1000 MG PO TABS: 1.0000 | ORAL_TABLET | Freq: Two times a day (BID) | ORAL | Status: DC

## 2015-10-14 MED ORDER — EMPAGLIFLOZIN 25 MG PO TABS: 25.0000 mg | ORAL_TABLET | Freq: Every day | ORAL | Status: DC

## 2015-10-14 NOTE — Telephone Encounter (Signed)
Got a fax from [facility]) stating that this patient's meds (Kombiglyze XR 2.5-1,000mg  & Invokana 300mg ) is not covered by her insurance. They recommend:  Janumet, Janumet XR,  Jentadueto  &   Minus Liberty or Jardiance  After consulting with Dr. Steele Sizer, new meds (Jardiance 25mg  & Janument 50/1000mg ) were submitted.

## 2015-11-25 ENCOUNTER — Ambulatory Visit: Payer: BC Managed Care – PPO | Admitting: Family Medicine

## 2015-11-29 ENCOUNTER — Encounter: Payer: Self-pay | Admitting: *Deleted

## 2015-12-06 ENCOUNTER — Encounter: Payer: Self-pay | Admitting: Family Medicine

## 2015-12-06 ENCOUNTER — Ambulatory Visit (INDEPENDENT_AMBULATORY_CARE_PROVIDER_SITE_OTHER): Payer: BC Managed Care – PPO | Admitting: Family Medicine

## 2015-12-06 VITALS — BP 142/88 | HR 87 | Temp 98.8°F | Resp 18 | Ht 65.0 in | Wt 222.1 lb

## 2015-12-06 DIAGNOSIS — E785 Hyperlipidemia, unspecified: Secondary | ICD-10-CM | POA: Diagnosis not present

## 2015-12-06 DIAGNOSIS — R519 Headache, unspecified: Secondary | ICD-10-CM

## 2015-12-06 DIAGNOSIS — R51 Headache: Secondary | ICD-10-CM

## 2015-12-06 DIAGNOSIS — Z9114 Patient's other noncompliance with medication regimen: Secondary | ICD-10-CM | POA: Diagnosis not present

## 2015-12-06 DIAGNOSIS — I10 Essential (primary) hypertension: Secondary | ICD-10-CM

## 2015-12-06 DIAGNOSIS — IMO0002 Reserved for concepts with insufficient information to code with codable children: Secondary | ICD-10-CM

## 2015-12-06 DIAGNOSIS — E1165 Type 2 diabetes mellitus with hyperglycemia: Secondary | ICD-10-CM

## 2015-12-06 DIAGNOSIS — E1140 Type 2 diabetes mellitus with diabetic neuropathy, unspecified: Secondary | ICD-10-CM

## 2015-12-06 NOTE — Progress Notes (Signed)
Name: Meagan [name]   MRN: [id number]    DOB: 10-Dec-1950   Date:12/06/2015       Progress Note  Subjective  Chief Complaint  Chief Complaint  Patient presents with  . Follow-up    6 week F/U   . Diabetes    Increased Levemir and patient has been checking her sugar twice daily-once in the am once in the evening, Low-123 High-196 in the evening. Patient states since starting the Jardiance it is giving her a dull headache all the time.    HPI  DMII: she has changed her diet, daughter had bariatric surgery and she has been following her diet. Eat lean protein, no sugars.  She is taking her medications. She has  gained weight since last visit.Marland Kitchen Her hgbA1C was over 10 since last visit. She has neuropathy and also proteinuria. She is on ARB. Not taking gabapentin for neuropathy as prescribed.  She states symptoms are better controlled. She denies recent yeast infections. She is due for eye exam and she will schedule it herself. She denies polyphagia, polydipsia or polyuria or nocturia.   HTN: she did not take her medications today, bp is elevated.  No chest pain, no palpation. Explained importance of compliance.   Chronic Headache: she states she has headache, dull like almost daily, different times of the day, she has taking medication intermittent and it helps with symptoms. Headache is described as frontal and dull. She skips meals, not taking bp medication daily, does not drink enough water. Explained importance of not skipping meals, drinking at least 64 ounces of water daily, and resume Gabapentin at night.     Patient Active Problem List   Diagnosis Date Noted  . OSA (obstructive sleep apnea) 10/06/2015  . Hypertensive retinopathy 10/06/2015  . Thyroid cyst 10/06/2015  . Dyslipidemia 10/06/2015  . Type 2 diabetes, uncontrolled, with neuropathy ([place]) 10/06/2015  . History of pneumonia 10/06/2015  . Chronic neck pain 10/06/2015  . Occlusion and stenosis of carotid artery without mention  of cerebral infarction   . Hypertension, benign     Past Surgical History  Procedure Laterality Date  . Knee surgery Right 2010  . Rotator cuff repair  2008  . Breast biopsy    . Carpal tunnel release Left March 2015  . Tubal ligation    . Ganglion cyst removal     . Appendectomy    . Colonoscopy  2011    DR.[facility]  . Elbow surgery  March 2015    History reviewed. No pertinent family history.  Social History   Social History  . Marital Status: Widowed    Spouse Name: N/A  . Number of Children: N/A  . Years of Education: N/A   Occupational History  . Not on file.   Social History Main Topics  . Smoking status: Never Smoker   . Smokeless tobacco: Never Used  . Alcohol Use: No  . Drug Use: No  . Sexual Activity: Not on file   Other Topics Concern  . Not on file   Social History Narrative     Current outpatient prescriptions:  .  aspirin 81 MG tablet, Take 81 mg by mouth daily., Disp: , Rfl:  .  empagliflozin (JARDIANCE) 25 MG TABS tablet, Take 25 mg by mouth daily., Disp: 30 tablet, Rfl: 2 .  fluticasone (FLONASE) 50 MCG/ACT nasal spray, Place 2 sprays into both nostrils daily., Disp: 16 g, Rfl: 0 .  Insulin Detemir (LEVEMIR FLEXTOUCH) 100 UNIT/ML Pen, Inject 15  Units into the skin daily at 10 pm., Disp: 15 mL, Rfl: 2 .  losartan-hydrochlorothiazide (HYZAAR) 100-25 MG tablet, Take 1 tablet by mouth daily., Disp: 30 tablet, Rfl: 2 .  ONE TOUCH ULTRA TEST test strip, , Disp: , Rfl:  .  pravastatin (PRAVACHOL) 20 MG tablet, Take 1 tablet (20 mg total) by mouth daily., Disp: 30 tablet, Rfl: 2 .  sitaGLIPtin-metformin (JANUMET) 50-1000 MG tablet, Take 1 tablet by mouth 2 (two) times daily with a meal., Disp: 60 tablet, Rfl: 2  Allergies  Allergen Reactions  . Keflex [Cephalexin] Rash     ROS  Constitutional: Negative for fever or significant  weight change.  Respiratory: Negative for cough and shortness of breath.   Cardiovascular: Negative for chest pain or  palpitations.  Gastrointestinal: Negative for abdominal pain, no bowel changes.  Musculoskeletal: Negative for gait problem or joint swelling.  Skin: Negative for rash.  Neurological: Negative for dizziness or headache.  No other specific complaints in a complete review of systems (except as listed in HPI above).  Objective  Filed Vitals:   12/06/15 1446 12/06/15 1500  BP: 162/88 142/88  Pulse: 87   Temp: 98.8 F (37.1 C)   TempSrc: Oral   Resp: 18   Height: 5\' 5"  (1.651 m)   Weight: 222 lb 1.6 oz (100.744 kg)   SpO2: 95%     Body mass index is 36.96 kg/(m^2).  Physical Exam  Constitutional: Patient appears well-developed and well-nourished. Obese  No distress.  HEENT: head atraumatic, normocephalic, pupils equal and reactive to light, neck supple, throat within normal limits Cardiovascular: Normal rate, regular rhythm and normal heart sounds.  No murmur heard. No BLE edema. Pulmonary/Chest: Effort normal and breath sounds normal. No respiratory distress. Abdominal: Soft.  There is no tenderness. Psychiatric: Patient has a normal mood and affect. behavior is normal. Judgment and thought content normal. Neuro: no focal findings. Cranial nerve is intact  Recent Results (from the past 2160 hour(s))  POCT HgB A1C     Status: Abnormal   Collection Time: 10/06/15  3:18 PM  Result Value Ref Range   Hemoglobin A1C 10.9   POCT UA - Microalbumin     Status: Abnormal   Collection Time: 10/06/15  4:06 PM  Result Value Ref Range   Microalbumin Ur, POC 100 mg/L   Creatinine, POC  mg/dL   Albumin/Creatinine Ratio, Urine, POC       PHQ2/9: Depression screen [facility] 2/9 12/06/2015 10/06/2015  Decreased Interest 0 0  Down, Depressed, Hopeless 0 0  PHQ - 2 Score 0 0     Fall Risk: Fall Risk  12/06/2015 10/06/2015  Falls in the past year? No No     Functional Status Survey: Is the patient deaf or have difficulty hearing?: No Does the patient have difficulty seeing, even when  wearing glasses/contacts?: No Does the patient have difficulty concentrating, remembering, or making decisions?: No Does the patient have difficulty walking or climbing stairs?: No Does the patient have difficulty dressing or bathing?: No Does the patient have difficulty doing errands alone such as visiting a doctor's office or shopping?: No    Assessment & Plan  1. Type 2 diabetes, uncontrolled, with neuropathy ([place])  Discussed importance of not skipping meals - Hemoglobin A1c  2. Hypertension, uncontrolled  bp is high today, she skipped medication  - Comprehensive metabolic panel  3. Non compliance w medication regimen  Discussed importance of taking medication as prescribed  4. Headache, chronic daily  Discussed  life style modifications for now.   5. Dyslipidemia  - Lipid panel

## 2016-01-10 ENCOUNTER — Ambulatory Visit: Payer: BC Managed Care – PPO | Admitting: Family Medicine

## 2016-02-01 ENCOUNTER — Ambulatory Visit (INDEPENDENT_AMBULATORY_CARE_PROVIDER_SITE_OTHER): Payer: BC Managed Care – PPO | Admitting: Family Medicine

## 2016-02-01 ENCOUNTER — Ambulatory Visit
Admission: RE | Admit: 2016-02-01 | Discharge: 2016-02-01 | Disposition: A | Discharge status: Home / Self Care | Payer: BC Managed Care – PPO | Source: Ambulatory Visit | Attending: Family Medicine | Admitting: Family Medicine

## 2016-02-01 ENCOUNTER — Encounter: Payer: Self-pay | Admitting: Family Medicine

## 2016-02-01 VITALS — BP 128/68 | HR 99 | Temp 98.3°F | Resp 16 | Ht 65.0 in | Wt 220.3 lb

## 2016-02-01 DIAGNOSIS — I10 Essential (primary) hypertension: Secondary | ICD-10-CM | POA: Diagnosis not present

## 2016-02-01 DIAGNOSIS — R05 Cough: Secondary | ICD-10-CM | POA: Diagnosis not present

## 2016-02-01 DIAGNOSIS — R058 Other specified cough: Secondary | ICD-10-CM

## 2016-02-01 DIAGNOSIS — J302 Other seasonal allergic rhinitis: Secondary | ICD-10-CM

## 2016-02-01 DIAGNOSIS — E1140 Type 2 diabetes mellitus with diabetic neuropathy, unspecified: Secondary | ICD-10-CM

## 2016-02-01 DIAGNOSIS — IMO0002 Reserved for concepts with insufficient information to code with codable children: Secondary | ICD-10-CM

## 2016-02-01 DIAGNOSIS — G4733 Obstructive sleep apnea (adult) (pediatric): Secondary | ICD-10-CM | POA: Diagnosis not present

## 2016-02-01 DIAGNOSIS — E785 Hyperlipidemia, unspecified: Secondary | ICD-10-CM

## 2016-02-01 DIAGNOSIS — E1165 Type 2 diabetes mellitus with hyperglycemia: Secondary | ICD-10-CM | POA: Diagnosis not present

## 2016-02-01 DIAGNOSIS — E1121 Type 2 diabetes mellitus with diabetic nephropathy: Secondary | ICD-10-CM

## 2016-02-01 LAB — LIPID PANEL
CHOL/HDL RATIO: 5.9 ratio — AB (ref 0.0–4.4)
Cholesterol, Total: 200 mg/dL — ABNORMAL HIGH (ref 100–199)
HDL: 34 mg/dL — AB (ref >39)
LDL CALC: 141 mg/dL — AB (ref 0–99)
TRIGLYCERIDES: 123 mg/dL (ref 0–149)
VLDL CHOLESTEROL CAL: 25 mg/dL (ref 5–40)

## 2016-02-01 LAB — COMPREHENSIVE METABOLIC PANEL
A/G RATIO: 1.3 (ref 1.2–2.2)
ALK PHOS: 135 IU/L — AB (ref 39–117)
ALT: 16 IU/L (ref 0–32)
AST: 15 IU/L (ref 0–40)
Albumin: 4.1 g/dL (ref 3.6–4.8)
BILIRUBIN TOTAL: 0.5 mg/dL (ref 0.0–1.2)
BUN/Creatinine Ratio: 14 (ref 12–28)
BUN: 10 mg/dL (ref 8–27)
CHLORIDE: 97 mmol/L (ref 96–106)
CO2: 21 mmol/L (ref 18–29)
Calcium: 9.1 mg/dL (ref 8.7–10.3)
Creatinine, Ser: 0.74 mg/dL (ref 0.57–1.00)
GFR calc Af Amer: 100 mL/min/{1.73_m2} (ref >59)
GFR calc non Af Amer: 87 mL/min/{1.73_m2} (ref >59)
GLUCOSE: 257 mg/dL — AB (ref 65–99)
Globulin, Total: 3.2 g/dL (ref 1.5–4.5)
POTASSIUM: 3.7 mmol/L (ref 3.5–5.2)
Sodium: 138 mmol/L (ref 134–144)
Total Protein: 7.3 g/dL (ref 6.0–8.5)

## 2016-02-01 LAB — HEMOGLOBIN A1C
ESTIMATED AVERAGE GLUCOSE: 258 mg/dL
HEMOGLOBIN A1C: 10.6 % — AB (ref 4.8–5.6)

## 2016-02-01 MED ORDER — BENZONATATE 100 MG PO CAPS: 100.0000 mg | ORAL_CAPSULE | Freq: Three times a day (TID) | ORAL | Status: DC | PRN

## 2016-02-01 MED ORDER — LEVOCETIRIZINE DIHYDROCHLORIDE 5 MG PO TABS: 5.0000 mg | ORAL_TABLET | Freq: Every evening | ORAL | Status: DC

## 2016-02-01 MED ORDER — DULAGLUTIDE 1.5 MG/0.5ML ~~LOC~~ SOAJ: 1.5000 mg | Freq: Every day | SUBCUTANEOUS | Status: DC

## 2016-02-01 MED ORDER — LOSARTAN POTASSIUM-HCTZ 100-25 MG PO TABS: 1.0000 | ORAL_TABLET | Freq: Every day | ORAL | Status: DC

## 2016-02-01 MED ORDER — EMPAGLIFLOZIN-METFORMIN HCL ER 12.5-1000 MG PO TB24: 2.0000 | ORAL_TABLET | Freq: Every day | ORAL | Status: DC

## 2016-02-01 MED ORDER — MONTELUKAST SODIUM 10 MG PO TABS: 10.0000 mg | ORAL_TABLET | Freq: Every day | ORAL | Status: DC

## 2016-02-01 MED ORDER — ROSUVASTATIN CALCIUM 10 MG PO TABS
10.0000 mg | ORAL_TABLET | Freq: Every day | ORAL | Status: DC
Start: 2016-02-01 — End: 2016-05-02

## 2016-02-01 NOTE — Progress Notes (Signed)
Name: Meagan [name]   MRN: [id number]    DOB: [birth year]   Date:02/01/2016       Progress Note  Subjective  Chief Complaint  Chief Complaint  Patient presents with  . Follow-up    6 week F/U and had blood work done yesterday morning  . Diabetes    Checking sugar once day, low-120 high-190, patient has lost 2 pounds since last visit  . Allergies    Worsen and having a hacking cough for the past 2 weeks and unable to shake it. Taking generic Claritin daily and cough drops, patient states she has a sore throat, congested.  SOB due to feeling like her glands are swollen, and having a productive green mucus with her cough.     HPI  DMII: she is doing better with a diabetic diet, but she skips oral medication and is not using Levemir.HgbA1C is still above goal. Discussed options and she is willing to start Trulicity.  She is taking her medications.  She has neuropathy and also proteinuria. She is on ARB She stopped taking  gabapentin for neuropathy, but is doing well at this time. She denies recent yeast infections. She is due for eye exam and she will schedule it herself. She denies polyphagia, polydipsia or polyuria or nocturia.   HTN: bp is at goal, taking medication daily and denies chest pain or palpitation   Hyperlipidemia: she has been taking Pravastatin and denies myalgia. Lipid panel is not at goal we will change to Crestor  Obesity: she states her daughter will have a sleeve procedure soon, and she will follow her daughter's diet, she is willing to lose weight .  OSA: she is wearing her CPAP machine every night, she denies morning headaches but still feels tired during the day, but not daily   Seasonal allergic Rhinitis: a few weeks ago she developed some itchy eyes, but denies nasal congestion or sneezing. She has been using Flonase.   Dry cough: started after allergy symptoms, cough is dry, occasionally mucus from post-nasal drainage, some SOB with coughing spells, no fever, no  chills. Normal appetites.   Patient Active Problem List   Diagnosis Date Noted  . Seasonal allergies 02/01/2016  . OSA (obstructive sleep apnea) 10/06/2015  . Hypertensive retinopathy 10/06/2015  . Thyroid cyst 10/06/2015  . Dyslipidemia 10/06/2015  . Type 2 diabetes, uncontrolled, with neuropathy ([place]) 10/06/2015  . History of pneumonia 10/06/2015  . Chronic neck pain 10/06/2015  . Occlusion and stenosis of carotid artery without mention of cerebral infarction   . Hypertension, benign     Past Surgical History  Procedure Laterality Date  . Knee surgery Right 2010  . Rotator cuff repair  2008  . Breast biopsy    . Carpal tunnel release Left March 2015  . Tubal ligation    . Ganglion cyst removal     . Appendectomy    . Colonoscopy  2011    [facility]  . Elbow surgery  March 2015    History reviewed. No pertinent family history.  Social History   Social History  . Marital Status: Widowed    Spouse Name: N/A  . Number of Children: N/A  . Years of Education: N/A   Occupational History  . Not on file.   Social History Main Topics  . Smoking status: Never Smoker   . Smokeless tobacco: Never Used  . Alcohol Use: No  . Drug Use: No  . Sexual Activity: Not on file   Other  Topics Concern  . Not on file   Social History Narrative     Current outpatient prescriptions:  .  aspirin 81 MG tablet, Take 81 mg by mouth daily., Disp: , Rfl:  .  fluticasone (FLONASE) 50 MCG/ACT nasal spray, Place 2 sprays into both nostrils daily., Disp: 16 g, Rfl: 0 .  losartan-hydrochlorothiazide (HYZAAR) 100-25 MG tablet, Take 1 tablet by mouth daily., Disp: 30 tablet, Rfl: 2 .  ONE TOUCH ULTRA TEST test strip, , Disp: , Rfl:  .  benzonatate (TESSALON) 100 MG capsule, Take 1-2 capsules (100-200 mg total) by mouth 3 (three) times daily as needed., Disp: 40 capsule, Rfl: 0 .  Dulaglutide (TRULICITY) 1.5 0000000 SOPN, Inject 1.5 mg into the skin daily., Disp: 2 mL, Rfl: 2 .   Empagliflozin-Metformin HCl ER (SYNJARDY XR) 12.02-999 MG TB24, Take 2 tablets by mouth daily before breakfast., Disp: 60 tablet, Rfl: 2 .  levocetirizine (XYZAL) 5 MG tablet, Take 1 tablet (5 mg total) by mouth every evening., Disp: 30 tablet, Rfl: 2 .  montelukast (SINGULAIR) 10 MG tablet, Take 1 tablet (10 mg total) by mouth at bedtime., Disp: 30 tablet, Rfl: 2 .  rosuvastatin (CRESTOR) 10 MG tablet, Take 1 tablet (10 mg total) by mouth daily., Disp: 30 tablet, Rfl: 2  Allergies  Allergen Reactions  . Keflex [Cephalexin] Rash     ROS  Ten systems reviewed and is negative except as mentioned in HPI   Objective  Filed Vitals:   02/01/16 1053  BP: 128/68  Pulse: 99  Temp: 98.3 F (36.8 C)  TempSrc: Oral  Resp: 16  Height: 5\' 5"  (1.651 m)  Weight: 220 lb 4.8 oz (99.927 kg)  SpO2: 94%    Body mass index is 36.66 kg/(m^2).  Physical Exam  Constitutional: Patient appears well-developed and well-nourished. Obese No distress.  HEENT: head atraumatic, normocephalic, pupils equal and reactive to light, ears normal TM bilaterally, boggy turbinates neck supple, throat within normal limits Cardiovascular: Normal rate, regular rhythm and normal heart sounds.  No murmur heard. No BLE edema. Pulmonary/Chest: Effort normal and breath sounds normal. No respiratory distress. Abdominal: Soft.  There is no tenderness. Psychiatric: Patient has a normal mood and affect. behavior is normal. Judgment and thought content normal.  Recent Results (from the past 2160 hour(s))  Lipid panel     Status: Abnormal   Collection Time: 01/31/16 11:15 AM  Result Value Ref Range   Cholesterol, Total 200 (H) 100 - 199 mg/dL   Triglycerides 123 0 - 149 mg/dL   HDL 34 (L) >39 mg/dL   VLDL Cholesterol Cal 25 5 - 40 mg/dL   LDL Calculated 141 (H) 0 - 99 mg/dL   Chol/HDL Ratio 5.9 (H) 0.0 - 4.4 ratio units    Comment:                                   T. Chol/HDL Ratio                                              Men  Women                               1/2 Avg.Risk  3.4    3.3  Avg.Risk  5.0    4.4                                2X Avg.Risk  9.6    7.1                                3X Avg.Risk 23.4   11.0   Comprehensive metabolic panel     Status: Abnormal   Collection Time: 01/31/16 11:15 AM  Result Value Ref Range   Glucose 257 (H) 65 - 99 mg/dL   BUN 10 8 - 27 mg/dL   Creatinine, Ser 0.74 0.57 - 1.00 mg/dL   GFR calc non Af Amer 87 >59 mL/min/1.73   GFR calc Af Amer 100 >59 mL/min/1.73   BUN/Creatinine Ratio 14 12 - 28   Sodium 138 134 - 144 mmol/L   Potassium 3.7 3.5 - 5.2 mmol/L   Chloride 97 96 - 106 mmol/L   CO2 21 18 - 29 mmol/L   Calcium 9.1 8.7 - 10.3 mg/dL   Total Protein 7.3 6.0 - 8.5 g/dL   Albumin 4.1 3.6 - 4.8 g/dL   Globulin, Total 3.2 1.5 - 4.5 g/dL   Albumin/Globulin Ratio 1.3 1.2 - 2.2   Bilirubin Total 0.5 0.0 - 1.2 mg/dL   Alkaline Phosphatase 135 (H) 39 - 117 IU/L   AST 15 0 - 40 IU/L   ALT 16 0 - 32 IU/L  Hemoglobin A1c     Status: Abnormal   Collection Time: 01/31/16 11:15 AM  Result Value Ref Range   Hgb A1c MFr Bld 10.6 (H) 4.8 - 5.6 %    Comment:          Pre-diabetes: 5.7 - 6.4          Diabetes: >6.4          Glycemic control for adults with diabetes: <7.0    Est. average glucose Bld gHb Est-mCnc 258 mg/dL     PHQ2/9: Depression screen [facility] 2/9 12/06/2015 10/06/2015  Decreased Interest 0 0  Down, Depressed, Hopeless 0 0  PHQ - 2 Score 0 0     Fall Risk: Fall Risk  12/06/2015 10/06/2015  Falls in the past year? No No     Assessment & Plan  1. Type 2 diabetes, uncontrolled, with neuropathy (HCC)  - Empagliflozin-Metformin HCl ER (SYNJARDY XR) 12.02-999 MG TB24; Take 2 tablets by mouth daily before breakfast.  Dispense: 60 tablet; Refill: 2 - Dulaglutide (TRULICITY) 1.5 0000000 SOPN; Inject 1.5 mg into the skin daily.  Dispense: 2 mL; Refill: 2  2. OSA (obstructive sleep apnea)  Wearing CPAP every  night  3. Type 2 diabetes with nephropathy (HCC)  - losartan-hydrochlorothiazide (HYZAAR) 100-25 MG tablet; Take 1 tablet by mouth daily.  Dispense: 30 tablet; Refill: 2 - Empagliflozin-Metformin HCl ER (SYNJARDY XR) 12.02-999 MG TB24; Take 2 tablets by mouth daily before breakfast.  Dispense: 60 tablet; Refill: 2 - Dulaglutide (TRULICITY) 1.5 0000000 SOPN; Inject 1.5 mg into the skin daily.  Dispense: 2 mL; Refill: 2  4. Dyslipidemia  - rosuvastatin (CRESTOR) 10 MG tablet; Take 1 tablet (10 mg total) by mouth daily.  Dispense: 30 tablet; Refill: 2  5. Hypertension, benign  - losartan-hydrochlorothiazide (HYZAAR) 100-25 MG tablet; Take 1 tablet by mouth daily.  Dispense: 30 tablet; Refill: 2  6. Dry cough  - DG Chest  2 View; Future - Spirometry: Pre & Post Eval - normal  - montelukast (SINGULAIR) 10 MG tablet; Take 1 tablet (10 mg total) by mouth at bedtime.  Dispense: 30 tablet; Refill: 2 - benzonatate (TESSALON) 100 MG capsule; Take 1-2 capsules (100-200 mg total) by mouth 3 (three) times daily as needed.  Dispense: 40 capsule; Refill: 0  7. Seasonal allergies  - montelukast (SINGULAIR) 10 MG tablet; Take 1 tablet (10 mg total) by mouth at bedtime.  Dispense: 30 tablet; Refill: 2 - levocetirizine (XYZAL) 5 MG tablet; Take 1 tablet (5 mg total) by mouth every evening.  Dispense: 30 tablet; Refill: 2

## 2016-02-03 ENCOUNTER — Telehealth: Payer: Self-pay

## 2016-02-03 NOTE — Telephone Encounter (Signed)
-----   Message from Steele Sizer, MD sent at 02/01/2016  9:04 PM EDT ----- Normal CXR

## 2016-02-03 NOTE — Telephone Encounter (Signed)
Was unable to leave a message with results on either the home nor the cell phone numbers due to voicemail boxes were full.

## 2016-02-09 ENCOUNTER — Other Ambulatory Visit: Payer: Self-pay

## 2016-02-09 DIAGNOSIS — Z1231 Encounter for screening mammogram for malignant neoplasm of breast: Secondary | ICD-10-CM

## 2016-02-14 ENCOUNTER — Ambulatory Visit: Payer: BC Managed Care – PPO | Admitting: General Surgery

## 2016-02-21 ENCOUNTER — Other Ambulatory Visit: Payer: Self-pay | Admitting: General Surgery

## 2016-02-21 ENCOUNTER — Ambulatory Visit
Admission: RE | Admit: 2016-02-21 | Discharge: 2016-02-21 | Disposition: A | Discharge status: Home / Self Care | Payer: BC Managed Care – PPO | Source: Ambulatory Visit | Attending: General Surgery | Admitting: General Surgery

## 2016-02-21 DIAGNOSIS — Z1231 Encounter for screening mammogram for malignant neoplasm of breast: Secondary | ICD-10-CM

## 2016-02-28 ENCOUNTER — Encounter: Payer: Self-pay | Admitting: General Surgery

## 2016-02-28 ENCOUNTER — Ambulatory Visit (INDEPENDENT_AMBULATORY_CARE_PROVIDER_SITE_OTHER): Payer: BC Managed Care – PPO | Admitting: General Surgery

## 2016-02-28 ENCOUNTER — Ambulatory Visit: Payer: Self-pay

## 2016-02-28 VITALS — BP 142/74 | HR 80 | Resp 14 | Ht 64.5 in | Wt 214.0 lb

## 2016-02-28 DIAGNOSIS — I6529 Occlusion and stenosis of unspecified carotid artery: Secondary | ICD-10-CM

## 2016-02-28 DIAGNOSIS — Z9889 Other specified postprocedural states: Secondary | ICD-10-CM

## 2016-02-28 NOTE — Patient Instructions (Addendum)
The patient is aware to call back for any questions or concerns. If swallowing difficulty continues she will ntoify our office for further evaluation.  Patient will be asked to return to the office in one year with a bilateral screening mammogram and office carotid ultrasound. Continue self breast exams. Call office for any new breast issues or concerns.

## 2016-02-28 NOTE — Progress Notes (Signed)
Patient ID: Meagan [name], female   DOB: 1951-08-18, 65 y.o.   MRN: [id number]  Chief Complaint  Patient presents with  . Follow-up    mammogram and carotid ultrasound    HPI Meagan [name] is a 65 y.o. female.  who presents for a breast evaluation. The most recent mammogram was done on 02-21-16.  Patient does perform regular self breast checks and gets regular mammograms done.   Follow up carotid ultrasound as well. She does admit to occasional difficulty swallowing (coughing) liquids but not with every meal. Denies dizziness or headaches. I have reviewed the history of present illness with the patient. HPI  Past Medical History  Diagnosis Date  . Occlusion and stenosis of carotid artery without mention of cerebral infarction   . Hypertension 2004  . Diffuse cystic mastopathy   . Cellulitis     left leg  . Diabetes mellitus without complication (HCC)     non-insulin dependent  . Fall 2013  . Sleep apnea     Past Surgical History  Procedure Laterality Date  . Knee surgery Right 2010  . Rotator cuff repair  2008  . Carpal tunnel release Left March 2015  . Tubal ligation    . Ganglion cyst removal     . Appendectomy    . Colonoscopy  2011    [facility]  . Elbow surgery  March 2015  . Breast biopsy Left 10+ yrs ago    neg  . Biopsy thyroid  2006    [facility]    Family History  Problem Relation Age of Onset  . Breast cancer Sister 25  . Breast cancer Maternal Aunt     Social History Social History  Substance Use Topics  . Smoking status: Never Smoker   . Smokeless tobacco: Never Used  . Alcohol Use: No    Allergies  Allergen Reactions  . Keflex [Cephalexin] Rash    Current Outpatient Prescriptions  Medication Sig Dispense Refill  . aspirin 81 MG tablet Take 81 mg by mouth daily.    . benzonatate (TESSALON) 100 MG capsule Take 1-2 capsules (100-200 mg total) by mouth 3 (three) times daily as needed. 40 capsule 0  . Dulaglutide (TRULICITY) 1.5 0000000 SOPN Inject 1.5  mg into the skin daily. 2 mL 2  . Empagliflozin-Metformin HCl ER (SYNJARDY XR) 12.02-999 MG TB24 Take 2 tablets by mouth daily before breakfast. 60 tablet 2  . fluticasone (FLONASE) 50 MCG/ACT nasal spray Place 2 sprays into both nostrils daily. 16 g 0  . levocetirizine (XYZAL) 5 MG tablet Take 1 tablet (5 mg total) by mouth every evening. 30 tablet 2  . losartan-hydrochlorothiazide (HYZAAR) 100-25 MG tablet Take 1 tablet by mouth daily. 30 tablet 2  . montelukast (SINGULAIR) 10 MG tablet Take 1 tablet (10 mg total) by mouth at bedtime. 30 tablet 2  . ONE TOUCH ULTRA TEST test strip     . rosuvastatin (CRESTOR) 10 MG tablet Take 1 tablet (10 mg total) by mouth daily. 30 tablet 2   No current facility-administered medications for this visit.    Review of Systems Review of Systems  Constitutional: Negative.   Respiratory: Negative.   Cardiovascular: Negative.     Blood pressure 142/74, pulse 80, resp. rate 14, height 5' 4.5" (1.638 m), weight 214 lb (97.07 kg).  Physical Exam Physical Exam  Constitutional: She is oriented to person, place, and time. She appears well-developed and well-nourished.  HENT:  Mouth/Throat: Oropharynx is clear and moist.  Eyes:  Conjunctivae are normal. No scleral icterus.  Neck: Neck supple.  Cardiovascular: Normal rate, regular rhythm and normal heart sounds.   Pulmonary/Chest: Effort normal and breath sounds normal. Right breast exhibits no inverted nipple, no mass, no nipple discharge, no skin change and no tenderness. Left breast exhibits no inverted nipple, no mass, no nipple discharge, no skin change and no tenderness.  Abdominal: Soft. There is no tenderness.  Lymphadenopathy:    She has no cervical adenopathy.    She has no axillary adenopathy.  Neurological: She is alert and oriented to person, place, and time.  Skin: Skin is warm and dry.  Psychiatric: Her behavior is normal.    Data Reviewed Prior notes. Mammogram reviewed and  stable. Carotid doppler shows mild plaque R.L no stenosis Assessment    Stable physical exam. Carotid doppler was performed and showed stable mild to moderate plaquing.     Plan    If swallowing difficulty continues she will ntoify our office for further evaluation.  Patient will be asked to return to the office in one year with a bilateral screening mammogram and office carotid ultrasound. Continue self breast exams. Call office for any new breast issues or concerns.         PCP:  Steele Sizer This information has been scribed by Karie Fetch RN, BSN,BC.   SANKAR,SEEPLAPUTHUR G 02/29/2016, 10:53 AM

## 2016-02-29 ENCOUNTER — Encounter: Payer: Self-pay | Admitting: General Surgery

## 2016-04-30 ENCOUNTER — Other Ambulatory Visit: Payer: Self-pay | Admitting: Family Medicine

## 2016-04-30 DIAGNOSIS — E1140 Type 2 diabetes mellitus with diabetic neuropathy, unspecified: Secondary | ICD-10-CM

## 2016-04-30 DIAGNOSIS — E1121 Type 2 diabetes mellitus with diabetic nephropathy: Secondary | ICD-10-CM

## 2016-04-30 DIAGNOSIS — E1165 Type 2 diabetes mellitus with hyperglycemia: Principal | ICD-10-CM

## 2016-04-30 DIAGNOSIS — IMO0002 Reserved for concepts with insufficient information to code with codable children: Secondary | ICD-10-CM

## 2016-05-02 ENCOUNTER — Other Ambulatory Visit: Payer: Self-pay | Admitting: Family Medicine

## 2016-05-02 ENCOUNTER — Ambulatory Visit: Payer: BC Managed Care – PPO | Admitting: Family Medicine

## 2016-05-02 DIAGNOSIS — E785 Hyperlipidemia, unspecified: Secondary | ICD-10-CM

## 2016-05-02 NOTE — Telephone Encounter (Signed)
Patient requesting refill. 

## 2016-05-22 ENCOUNTER — Ambulatory Visit: Payer: BC Managed Care – PPO | Admitting: Family Medicine

## 2016-06-07 ENCOUNTER — Ambulatory Visit (INDEPENDENT_AMBULATORY_CARE_PROVIDER_SITE_OTHER): Payer: BC Managed Care – PPO | Admitting: Family Medicine

## 2016-07-06 ENCOUNTER — Ambulatory Visit (INDEPENDENT_AMBULATORY_CARE_PROVIDER_SITE_OTHER): Payer: BC Managed Care – PPO | Admitting: Family Medicine

## 2016-07-06 ENCOUNTER — Encounter: Payer: Self-pay | Admitting: Family Medicine

## 2016-07-06 VITALS — BP 144/82 | HR 86 | Temp 98.3°F | Resp 16 | Ht 65.0 in | Wt 218.2 lb

## 2016-07-06 DIAGNOSIS — E785 Hyperlipidemia, unspecified: Secondary | ICD-10-CM | POA: Diagnosis not present

## 2016-07-06 DIAGNOSIS — E1121 Type 2 diabetes mellitus with diabetic nephropathy: Secondary | ICD-10-CM

## 2016-07-06 DIAGNOSIS — G4733 Obstructive sleep apnea (adult) (pediatric): Secondary | ICD-10-CM | POA: Diagnosis not present

## 2016-07-06 DIAGNOSIS — E1140 Type 2 diabetes mellitus with diabetic neuropathy, unspecified: Secondary | ICD-10-CM

## 2016-07-06 DIAGNOSIS — E1165 Type 2 diabetes mellitus with hyperglycemia: Secondary | ICD-10-CM

## 2016-07-06 DIAGNOSIS — I10 Essential (primary) hypertension: Secondary | ICD-10-CM | POA: Diagnosis not present

## 2016-07-06 DIAGNOSIS — IMO0002 Reserved for concepts with insufficient information to code with codable children: Secondary | ICD-10-CM

## 2016-07-06 LAB — POCT UA - MICROALBUMIN: MICROALBUMIN (UR) POC: 100 mg/L

## 2016-07-06 LAB — POCT GLYCOSYLATED HEMOGLOBIN (HGB A1C): HEMOGLOBIN A1C: 10.8

## 2016-07-06 MED ORDER — DULAGLUTIDE 1.5 MG/0.5ML ~~LOC~~ SOAJ: SUBCUTANEOUS | 2 refills | Status: DC

## 2016-07-06 NOTE — Progress Notes (Signed)
Name: Meagan [name]   MRN: [id number]    DOB: Sep 05, 1951   Date:07/06/2016       Progress Note  Subjective  Chief Complaint  Chief Complaint  Patient presents with  . Diabetes    3 month follow up. lows 150 high 297 avg 156 checked daily  . Hypertension    HPI  DMII: she is has not been compliant with diabetic diet. .HgbA1C is still above 10. She has been taking oral medications at most every other day. She has microalbuminuria. She is taking Losartan/hctz - seldom misses a dose. She used Trulicity for one month since last visit in April but has not filled it since - she states she forgot to pick rx from pharmacy.  She has neuropathy she stopped   gabapentin for neuropathy, but is doing well at this time. She denies recent yeast infections. She is due for eye exam and she will schedule it herself. She denies polyphagia, polydipsia or polyuria or nocturia. Discussed again importance of compliance with medication and life style modification to decrease her risk of heart attack, strokes, amputation, kidney failure or blindness.   HTN: bp is elevated today,  taking medication daily and denies chest pain or palpitation   Hyperlipidemia:she is on Crestor at this time, on our records last rx was written 01/2016 but she states she has been compliant with medication   Obesity: she was losing weight following a healthy diet with her daughter, however she is no longer eating healthy and not exercising.   OSA: she is wearing her CPAP machine every night, she denies morning headaches but still feels tired during the day, but not daily     Patient Active Problem List   Diagnosis Date Noted  . Seasonal allergies 02/01/2016  . OSA (obstructive sleep apnea) 10/06/2015  . Hypertensive retinopathy 10/06/2015  . Thyroid cyst 10/06/2015  . Dyslipidemia 10/06/2015  . Type 2 diabetes, uncontrolled, with neuropathy ([place]) 10/06/2015  . History of pneumonia 10/06/2015  . Chronic neck pain 10/06/2015  .  Occlusion and stenosis of carotid artery without mention of cerebral infarction   . Hypertension, benign     Past Surgical History:  Procedure Laterality Date  . APPENDECTOMY    . BIOPSY THYROID  2006   [facility]  . BREAST BIOPSY Left 10+ yrs ago   neg  . CARPAL TUNNEL RELEASE Left March 2015  . COLONOSCOPY  2011   [facility] [facility]  March 2015  . ganglion cyst removal     . KNEE SURGERY Right 2010  . ROTATOR CUFF REPAIR  2008  . TUBAL LIGATION      Family History  Problem Relation Age of Onset  . Breast cancer Sister 72  . Breast cancer Maternal Aunt     Social History   Social History  . Marital status: Widowed    Spouse name: N/A  . Number of children: N/A  . Years of education: N/A   Occupational History  . Not on file.   Social History Main Topics  . Smoking status: Never Smoker  . Smokeless tobacco: Never Used  . Alcohol use No  . Drug use: No  . Sexual activity: Not on file   Other Topics Concern  . Not on file   Social History Narrative  . No narrative on file     Current Outpatient Prescriptions:  .  aspirin 81 MG tablet, Take 81 mg by mouth daily., Disp: , Rfl:  .  Dulaglutide (  TRULICITY) 1.5 0000000 SOPN, INJECT 1.5 MG ( ONE PEN) INTO THE SKIN EVERY WEEK, Disp: 4 pen, Rfl: 2 .  Empagliflozin-Metformin HCl ER (SYNJARDY XR) 12.02-999 MG TB24, Take 2 tablets by mouth daily before breakfast., Disp: 60 tablet, Rfl: 2 .  fluticasone (FLONASE) 50 MCG/ACT nasal spray, Place 2 sprays into both nostrils daily., Disp: 16 g, Rfl: 0 .  levocetirizine (XYZAL) 5 MG tablet, Take 1 tablet (5 mg total) by mouth every evening., Disp: 30 tablet, Rfl: 2 .  losartan-hydrochlorothiazide (HYZAAR) 100-25 MG tablet, Take 1 tablet by mouth daily., Disp: 30 tablet, Rfl: 2 .  montelukast (SINGULAIR) 10 MG tablet, Take 1 tablet (10 mg total) by mouth at bedtime., Disp: 30 tablet, Rfl: 2 .  ONE TOUCH ULTRA TEST test strip, , Disp: , Rfl:  .  rosuvastatin (CRESTOR) 10 MG  tablet, TAKE 1 TABLET (10 MG TOTAL) BY MOUTH DAILY., Disp: 30 tablet, Rfl: 2  Allergies  Allergen Reactions  . Keflex [Cephalexin] Rash     ROS  Constitutional: Negative for fever or significant  weight change.  Respiratory: Negative for cough and shortness of breath.   Cardiovascular: Negative for chest pain or palpitations.  Gastrointestinal: Negative for abdominal pain, no bowel changes.  Musculoskeletal: Negative for gait problem or joint swelling.  Skin: Negative for rash.  Neurological: Negative for dizziness or headache.  No other specific complaints in a complete review of systems (except as listed in HPI above).  Objective  Vitals:   07/06/16 1053  BP: (!) 144/82  Pulse: 86  Resp: 16  Temp: 98.3 F (36.8 C)  SpO2: 97%  Weight: 218 lb 3 oz (99 kg)  Height: 5\' 5"  (1.651 m)    Body mass index is 36.31 kg/m.  Physical Exam  Constitutional: Patient appears well-developed and well-nourished. Obese No distress.  HEENT: head atraumatic, normocephalic, pupils equal and reactive to light, neck supple, throat within normal limits Cardiovascular: Normal rate, regular rhythm and normal heart sounds.  No murmur heard. No BLE edema. Pulmonary/Chest: Effort normal and breath sounds normal. No respiratory distress. Abdominal: Soft.  There is no tenderness. Psychiatric: Patient has a normal mood and affect. behavior is normal. Judgment and thought content normal.  Recent Results (from the past 2160 hour(s))  POCT UA - Microalbumin     Status: None   Collection Time: 07/06/16 11:07 AM  Result Value Ref Range   Microalbumin Ur, POC 100 mg/L   Creatinine, POC  mg/dL   Albumin/Creatinine Ratio, Urine, POC    POCT HgB A1C     Status: None   Collection Time: 07/06/16 11:13 AM  Result Value Ref Range   Hemoglobin A1C 10.8     Diabetic Foot Exam: Diabetic Foot Exam - Simple   Simple Foot Form Diabetic Foot exam was performed with the following findings:  Yes   Visual  Inspection No deformities, no ulcerations, no other skin breakdown bilaterally:  Yes Sensation Testing Intact to touch and monofilament testing bilaterally:  Yes Pulse Check Posterior Tibialis and Dorsalis pulse intact bilaterally:  Yes Comments      PHQ2/9: Depression screen [facility] 2/9 07/06/2016 12/06/2015 10/06/2015  Decreased Interest 0 0 0  Down, Depressed, Hopeless 0 0 0  PHQ - 2 Score 0 0 0     Fall Risk: Fall Risk  07/06/2016 12/06/2015 10/06/2015  Falls in the past year? No No No    Functional Status Survey: Is the patient deaf or have difficulty hearing?: No Does the patient have difficulty  seeing, even when wearing glasses/contacts?: No Does the patient have difficulty concentrating, remembering, or making decisions?: No Does the patient have difficulty walking or climbing stairs?: No Does the patient have difficulty dressing or bathing?: No Does the patient have difficulty doing errands alone such as visiting a doctor's office or shopping?: No    Assessment & Plan  1. Type 2 diabetes, uncontrolled, with neuropathy (HCC)  - POCT HgB A1C - POCT UA - Microalbumin - Dulaglutide (TRULICITY) 1.5 0000000 SOPN; INJECT 1.5 MG ( ONE PEN) INTO THE SKIN EVERY WEEK  Dispense: 4 pen; Refill: 2  2. OSA (obstructive sleep apnea)  Wearing CPAP every night  3. Dyslipidemia  Continue Crestor, recheck labs next visit   4. Hypertension, benign  bp is elevated today, explained importance of compliance  5. Type 2 diabetes with nephropathy ([place])  Must resume medication  - Dulaglutide (TRULICITY) 1.5 0000000 SOPN; INJECT 1.5 MG ( ONE PEN) INTO THE SKIN EVERY WEEK  Dispense: 4 pen; Refill: 2

## 2016-07-09 ENCOUNTER — Other Ambulatory Visit: Payer: Self-pay | Admitting: Family Medicine

## 2016-07-09 NOTE — Telephone Encounter (Signed)
Patient requesting refill of One Touch Test strips.

## 2016-08-02 ENCOUNTER — Other Ambulatory Visit: Payer: Self-pay | Admitting: Family Medicine

## 2016-08-02 DIAGNOSIS — I10 Essential (primary) hypertension: Secondary | ICD-10-CM

## 2016-08-02 DIAGNOSIS — E1121 Type 2 diabetes mellitus with diabetic nephropathy: Secondary | ICD-10-CM

## 2016-08-02 NOTE — Telephone Encounter (Signed)
Patient requesting refill of Losartan-HCTZ to [facility].  

## 2016-08-16 ENCOUNTER — Ambulatory Visit: Payer: BC Managed Care – PPO | Admitting: Family Medicine

## 2016-09-05 ENCOUNTER — Encounter: Payer: Self-pay | Admitting: Family Medicine

## 2016-09-05 ENCOUNTER — Ambulatory Visit (INDEPENDENT_AMBULATORY_CARE_PROVIDER_SITE_OTHER): Payer: BC Managed Care – PPO | Admitting: Family Medicine

## 2016-09-05 VITALS — BP 136/86 | HR 86 | Temp 98.1°F | Resp 16 | Ht 65.0 in | Wt 217.0 lb

## 2016-09-05 DIAGNOSIS — E1165 Type 2 diabetes mellitus with hyperglycemia: Secondary | ICD-10-CM

## 2016-09-05 DIAGNOSIS — E1140 Type 2 diabetes mellitus with diabetic neuropathy, unspecified: Secondary | ICD-10-CM

## 2016-09-05 DIAGNOSIS — IMO0002 Reserved for concepts with insufficient information to code with codable children: Secondary | ICD-10-CM

## 2016-09-05 NOTE — Progress Notes (Signed)
Name: Meagan [name]   MRN: [id number]    DOB: 1951-07-05   Date:09/05/2016       Progress Note  Subjective  Chief Complaint  Chief Complaint  Patient presents with  . Diabetes    6 week follow up 272 high 140-150 lowest    HPI  DMII: she forgot to bring her sugar log, she is tolerating medication well and has been compliant since last visit. Fasting glucose is not at goal yet, she states she wants to adjust her diet first. Make better food choices and avoid eating late at night. She denies polyphagia, polyuria or polydipsia. She works two jobs, and eats on the goal. Discussed healthy options. She has been feeling more tired lately, but she has been compliant with CPAP and denies depression   Patient Active Problem List   Diagnosis Date Noted  . Seasonal allergies 02/01/2016  . OSA (obstructive sleep apnea) 10/06/2015  . Hypertensive retinopathy 10/06/2015  . Thyroid cyst 10/06/2015  . Dyslipidemia 10/06/2015  . Type 2 diabetes, uncontrolled, with neuropathy ([place]) 10/06/2015  . History of pneumonia 10/06/2015  . Chronic neck pain 10/06/2015  . Occlusion and stenosis of carotid artery without mention of cerebral infarction   . Hypertension, benign     Past Surgical History:  Procedure Laterality Date  . APPENDECTOMY    . BIOPSY THYROID  2006   [facility]  . BREAST BIOPSY Left 10+ yrs ago   neg  . CARPAL TUNNEL RELEASE Left March 2015  . COLONOSCOPY  2011   [facility] [facility]  March 2015  . ganglion cyst removal     . KNEE SURGERY Right 2010  . ROTATOR CUFF REPAIR  2008  . TUBAL LIGATION      Family History  Problem Relation Age of Onset  . Breast cancer Sister 77  . Breast cancer Maternal Aunt     Social History   Social History  . Marital status: Widowed    Spouse name: N/A  . Number of children: N/A  . Years of education: N/A   Occupational History  . Not on file.   Social History Main Topics  . Smoking status: Never Smoker  . Smokeless tobacco: Never  Used  . Alcohol use No  . Drug use: No  . Sexual activity: Not on file   Other Topics Concern  . Not on file   Social History Narrative  . No narrative on file     Current Outpatient Prescriptions:  .  aspirin 81 MG tablet, Take 81 mg by mouth daily., Disp: , Rfl:  .  Dulaglutide (TRULICITY) 1.5 0000000 SOPN, INJECT 1.5 MG ( ONE PEN) INTO THE SKIN EVERY WEEK, Disp: 4 pen, Rfl: 2 .  Empagliflozin-Metformin HCl ER (SYNJARDY XR) 12.02-999 MG TB24, Take 2 tablets by mouth daily before breakfast., Disp: 60 tablet, Rfl: 2 .  fluticasone (FLONASE) 50 MCG/ACT nasal spray, Place 2 sprays into both nostrils daily., Disp: 16 g, Rfl: 0 .  levocetirizine (XYZAL) 5 MG tablet, Take 1 tablet (5 mg total) by mouth every evening., Disp: 30 tablet, Rfl: 2 .  losartan-hydrochlorothiazide (HYZAAR) 100-25 MG tablet, TAKE 1 TABLET BY MOUTH DAILY., Disp: 30 tablet, Rfl: 2 .  montelukast (SINGULAIR) 10 MG tablet, Take 1 tablet (10 mg total) by mouth at bedtime., Disp: 30 tablet, Rfl: 2 .  ONE TOUCH ULTRA TEST test strip, USE AS DIRECTED TWICE A DAY, Disp: 50 each, Rfl: 5 .  rosuvastatin (CRESTOR) 10 MG tablet, TAKE  1 TABLET (10 MG TOTAL) BY MOUTH DAILY., Disp: 30 tablet, Rfl: 2  Allergies  Allergen Reactions  . Keflex [Cephalexin] Rash     ROS  Ten systems reviewed and is negative except as mentioned in HPI   Objective  Vitals:   09/05/16 1137  BP: 136/86  Pulse: 86  Resp: 16  Temp: 98.1 F (36.7 C)  TempSrc: Oral  SpO2: 96%  Weight: 217 lb (98.4 kg)  Height: 5\' 5"  (1.651 m)    Body mass index is 36.11 kg/m.  Physical Exam  Constitutional: Patient appears well-developed and well-nourished. Obese No distress.  HEENT: head atraumatic, normocephalic, pupils equal and reactive to light, neck supple, throat within normal limits Cardiovascular: Normal rate, regular rhythm and normal heart sounds.  No murmur heard. No BLE edema. Pulmonary/Chest: Effort normal and breath sounds normal. No  respiratory distress. Abdominal: Soft.  There is no tenderness. Psychiatric: Patient has a normal mood and affect. behavior is normal. Judgment and thought content normal.  Recent Results (from the past 2160 hour(s))  POCT UA - Microalbumin     Status: None   Collection Time: 07/06/16 11:07 AM  Result Value Ref Range   Microalbumin Ur, POC 100 mg/L   Creatinine, POC  mg/dL   Albumin/Creatinine Ratio, Urine, POC    POCT HgB A1C     Status: None   Collection Time: 07/06/16 11:13 AM  Result Value Ref Range   Hemoglobin A1C 10.8     PHQ2/9: Depression screen [facility] 2/9 07/06/2016 12/06/2015 10/06/2015  Decreased Interest 0 0 0  Down, Depressed, Hopeless 0 0 0  PHQ - 2 Score 0 0 0     Fall Risk: Fall Risk  07/06/2016 12/06/2015 10/06/2015  Falls in the past year? No No No    Assessment & Plan  1. Type 2 diabetes, uncontrolled, with neuropathy ([place])  Discussed importance of packing her dinner, to avoid eating out. Glucose is not at goal yet. We may need to change medication if not at goal by next visit.

## 2016-09-27 ENCOUNTER — Ambulatory Visit: Payer: BC Managed Care – PPO | Admitting: Family Medicine

## 2016-10-15 ENCOUNTER — Telehealth: Payer: Self-pay | Admitting: Family Medicine

## 2016-10-15 ENCOUNTER — Other Ambulatory Visit: Payer: Self-pay | Admitting: Family Medicine

## 2016-10-15 ENCOUNTER — Other Ambulatory Visit: Payer: Self-pay

## 2016-10-15 DIAGNOSIS — E785 Hyperlipidemia, unspecified: Secondary | ICD-10-CM

## 2016-10-15 DIAGNOSIS — E041 Nontoxic single thyroid nodule: Secondary | ICD-10-CM

## 2016-10-15 DIAGNOSIS — E1140 Type 2 diabetes mellitus with diabetic neuropathy, unspecified: Secondary | ICD-10-CM

## 2016-10-15 DIAGNOSIS — IMO0002 Reserved for concepts with insufficient information to code with codable children: Secondary | ICD-10-CM

## 2016-10-15 DIAGNOSIS — I10 Essential (primary) hypertension: Secondary | ICD-10-CM

## 2016-10-15 DIAGNOSIS — E1165 Type 2 diabetes mellitus with hyperglycemia: Secondary | ICD-10-CM

## 2016-10-15 NOTE — Telephone Encounter (Signed)
Patient notified and paperwork is upfront ready for her to pick up and have done.

## 2016-10-15 NOTE — Telephone Encounter (Signed)
Pt states she is suppose to come in to get her labs done before her appt tomorrow. She R/S her appt because she has not gotten labs done as of yet. Pt is requesting her lab order to be ready so she can come by the office to pick up. Please advise as to when this will be ready.

## 2016-10-15 NOTE — Telephone Encounter (Signed)
Ready

## 2016-10-16 ENCOUNTER — Ambulatory Visit: Payer: BC Managed Care – PPO | Admitting: Family Medicine

## 2016-11-06 ENCOUNTER — Other Ambulatory Visit: Payer: Self-pay | Admitting: Family Medicine

## 2016-11-06 LAB — LIPID PANEL
Cholesterol: 198 mg/dL (ref <200)
HDL: 32 mg/dL — AB (ref >50)
LDL CALC: 139 mg/dL — AB (ref <100)
TRIGLYCERIDES: 133 mg/dL (ref <150)
Total CHOL/HDL Ratio: 6.2 Ratio — ABNORMAL HIGH (ref <5.0)
VLDL: 27 mg/dL (ref <30)

## 2016-11-06 LAB — CBC WITH DIFFERENTIAL/PLATELET
BASOS PCT: 0 %
Basophils Absolute: 0 cells/uL (ref 0–200)
EOS ABS: 168 {cells}/uL (ref 15–500)
Eosinophils Relative: 2 %
HEMATOCRIT: 43.3 % (ref 35.0–45.0)
Hemoglobin: 14.9 g/dL (ref 11.7–15.5)
LYMPHS ABS: 1932 {cells}/uL (ref 850–3900)
LYMPHS PCT: 23 %
MCH: 30.7 pg (ref 27.0–33.0)
MCHC: 34.4 g/dL (ref 32.0–36.0)
MCV: 89.3 fL (ref 80.0–100.0)
MONO ABS: 756 {cells}/uL (ref 200–950)
MPV: 10.1 fL (ref 7.5–12.5)
Monocytes Relative: 9 %
NEUTROS PCT: 66 %
Neutro Abs: 5544 cells/uL (ref 1500–7800)
Platelets: 273 10*3/uL (ref 140–400)
RBC: 4.85 MIL/uL (ref 3.80–5.10)
RDW: 13.4 % (ref 11.0–15.0)
WBC: 8.4 10*3/uL (ref 3.8–10.8)

## 2016-11-06 LAB — COMPLETE METABOLIC PANEL WITH GFR
ALT: 15 U/L (ref 6–29)
AST: 15 U/L (ref 10–35)
Albumin: 3.6 g/dL (ref 3.6–5.1)
Alkaline Phosphatase: 134 U/L — ABNORMAL HIGH (ref 33–130)
BUN: 10 mg/dL (ref 7–25)
CHLORIDE: 103 mmol/L (ref 98–110)
CO2: 24 mmol/L (ref 20–31)
CREATININE: 0.73 mg/dL (ref 0.50–0.99)
Calcium: 9.2 mg/dL (ref 8.6–10.4)
GFR, Est African American: >89 mL/min (ref >=60)
GFR, Est Non African American: 88 mL/min (ref >=60)
Glucose, Bld: 305 mg/dL — ABNORMAL HIGH (ref 65–99)
Potassium: 3.9 mmol/L (ref 3.5–5.3)
SODIUM: 138 mmol/L (ref 135–146)
Total Bilirubin: 0.5 mg/dL (ref 0.2–1.2)
Total Protein: 6.9 g/dL (ref 6.1–8.1)

## 2016-11-06 LAB — TSH: TSH: 0.92 m[IU]/L

## 2016-11-07 ENCOUNTER — Ambulatory Visit: Payer: BC Managed Care – PPO | Admitting: Family Medicine

## 2016-11-07 LAB — HEMOGLOBIN A1C
HEMOGLOBIN A1C: 10.7 % — AB (ref <5.7)
Mean Plasma Glucose: 260 mg/dL

## 2016-11-27 ENCOUNTER — Ambulatory Visit: Payer: BC Managed Care – PPO | Admitting: Family Medicine

## 2016-12-04 ENCOUNTER — Encounter: Payer: Self-pay | Admitting: Family Medicine

## 2016-12-04 ENCOUNTER — Ambulatory Visit (INDEPENDENT_AMBULATORY_CARE_PROVIDER_SITE_OTHER): Payer: BC Managed Care – PPO | Admitting: Family Medicine

## 2016-12-04 VITALS — BP 152/99 | HR 96 | Temp 98.1°F | Resp 16 | Ht 65.0 in | Wt 215.2 lb

## 2016-12-04 DIAGNOSIS — R809 Proteinuria, unspecified: Secondary | ICD-10-CM

## 2016-12-04 DIAGNOSIS — E1165 Type 2 diabetes mellitus with hyperglycemia: Secondary | ICD-10-CM | POA: Diagnosis not present

## 2016-12-04 DIAGNOSIS — E1169 Type 2 diabetes mellitus with other specified complication: Secondary | ICD-10-CM | POA: Diagnosis not present

## 2016-12-04 DIAGNOSIS — L298 Other pruritus: Secondary | ICD-10-CM | POA: Diagnosis not present

## 2016-12-04 DIAGNOSIS — I10 Essential (primary) hypertension: Secondary | ICD-10-CM | POA: Diagnosis not present

## 2016-12-04 DIAGNOSIS — E1140 Type 2 diabetes mellitus with diabetic neuropathy, unspecified: Secondary | ICD-10-CM

## 2016-12-04 DIAGNOSIS — G4733 Obstructive sleep apnea (adult) (pediatric): Secondary | ICD-10-CM | POA: Diagnosis not present

## 2016-12-04 DIAGNOSIS — IMO0002 Reserved for concepts with insufficient information to code with codable children: Secondary | ICD-10-CM

## 2016-12-04 DIAGNOSIS — E1129 Type 2 diabetes mellitus with other diabetic kidney complication: Secondary | ICD-10-CM | POA: Diagnosis not present

## 2016-12-04 DIAGNOSIS — N898 Other specified noninflammatory disorders of vagina: Secondary | ICD-10-CM

## 2016-12-04 DIAGNOSIS — E785 Hyperlipidemia, unspecified: Secondary | ICD-10-CM

## 2016-12-04 MED ORDER — FLUCONAZOLE 150 MG PO TABS: 150.0000 mg | ORAL_TABLET | ORAL | 0 refills | Status: DC

## 2016-12-04 MED ORDER — METFORMIN HCL ER (OSM) 1000 MG PO TB24: 1000.0000 mg | ORAL_TABLET | Freq: Every day | ORAL | 1 refills | Status: DC

## 2016-12-04 NOTE — Progress Notes (Signed)
Name: Meagan [name]   MRN: [id number]    DOB: 1951/04/25   Date:12/04/2016       Progress Note  Subjective  Chief Complaint  Chief Complaint  Patient presents with  . Diabetes    1 month follow up and blood work  . Hyperlipidemia  . Vaginal Itching    pt believes it was caused by medication also drying of her skin she believes is a side effect of the medication she was on. Pt has not taken medication    HPI  DMII: she is is not compliant with diet, exercise or medication. Her prescriptions usually last over 6 months. HgbA1C is still above 10. She stopped taking Synjardi because it made her skin dry.  She has microalbuminuria and is taking ARB but skips medications on a regular basis. She also has neuropathy but stopped Gabapentin because she does not like taking medication.  She has vaginal itching at this time, but no discharge.  She is due for eye exam and she will schedule it herself. She denies polyphagia, polydipsia or polyuria or nocturia. Discussed again importance of compliance with medication and life style modification to decrease her risk of heart attack, strokes, amputation, kidney failure or blindness.She agrees in seeing Endocrinologist at this time.  HTN: bp is elevated today, she is skipping medications and did not take it today, she denies chest pain or palpitation   Hyperlipidemia:she is on Crestor at this time, on our records last rx was written 01/2016 but she states she has been compliant with medication   Obesity: she was losing weight following a healthy diet with her daughter, however she is no longer eating healthy and not exercising.   OSA: she is wearing her CPAP machine every night, she denies morning headaches but still feels tired during the day, but not daily   Dyslipidemia: she has low HDL, high LDL and at one point high triglycerides. She is taking Crestor occasionally, and needs to change her diet  Patient Active Problem List   Diagnosis Date Noted   . Seasonal allergies 02/01/2016  . OSA (obstructive sleep apnea) 10/06/2015  . Hypertensive retinopathy 10/06/2015  . Thyroid cyst 10/06/2015  . Dyslipidemia 10/06/2015  . Type 2 diabetes, uncontrolled, with neuropathy ([place]) 10/06/2015  . History of pneumonia 10/06/2015  . Chronic neck pain 10/06/2015  . Occlusion and stenosis of carotid artery without mention of cerebral infarction   . Hypertension, benign     Past Surgical History:  Procedure Laterality Date  . APPENDECTOMY    . BIOPSY THYROID  2006   [facility]  . BREAST BIOPSY Left 10+ yrs ago   neg  . CARPAL TUNNEL RELEASE Left March 2015  . COLONOSCOPY  2011   [facility] [facility]  March 2015  . ganglion cyst removal     . KNEE SURGERY Right 2010  . ROTATOR CUFF REPAIR  2008  . TUBAL LIGATION      Family History  Problem Relation Age of Onset  . Breast cancer Sister 50  . Breast cancer Maternal Aunt     Social History   Social History  . Marital status: Widowed    Spouse name: N/A  . Number of children: N/A  . Years of education: N/A   Occupational History  . Not on file.   Social History Main Topics  . Smoking status: Never Smoker  . Smokeless tobacco: Never Used  . Alcohol use No  . Drug use: No  . Sexual  activity: Not on file   Other Topics Concern  . Not on file   Social History Narrative  . No narrative on file     Current Outpatient Prescriptions:  .  aspirin 81 MG tablet, Take 81 mg by mouth daily., Disp: , Rfl:  .  Dulaglutide (TRULICITY) 1.5 QJ/3.3LK SOPN, INJECT 1.5 MG ( ONE PEN) INTO THE SKIN EVERY WEEK, Disp: 4 pen, Rfl: 2 .  fluticasone (FLONASE) 50 MCG/ACT nasal spray, Place 2 sprays into both nostrils daily., Disp: 16 g, Rfl: 0 .  levocetirizine (XYZAL) 5 MG tablet, Take 1 tablet (5 mg total) by mouth every evening., Disp: 30 tablet, Rfl: 2 .  losartan-hydrochlorothiazide (HYZAAR) 100-25 MG tablet, TAKE 1 TABLET BY MOUTH DAILY., Disp: 30 tablet, Rfl: 2 .  montelukast (SINGULAIR)  10 MG tablet, Take 1 tablet (10 mg total) by mouth at bedtime., Disp: 30 tablet, Rfl: 2 .  ONE TOUCH ULTRA TEST test strip, USE AS DIRECTED TWICE A DAY, Disp: 50 each, Rfl: 5 .  rosuvastatin (CRESTOR) 10 MG tablet, TAKE 1 TABLET (10 MG TOTAL) BY MOUTH DAILY., Disp: 30 tablet, Rfl: 2  Allergies  Allergen Reactions  . Keflex [Cephalexin] Rash     ROS  Constitutional: Negative for fever or significant  weight change.  Respiratory: Negative for cough and shortness of breath.   Cardiovascular: Negative for chest pain or palpitations.  Gastrointestinal: Negative for abdominal pain, no bowel changes.  Musculoskeletal: Negative for gait problem or joint swelling.  Skin: Negative for rash. Dry skin Neurological: Negative for dizziness, positive for mild headache.  No other specific complaints in a complete review of systems (except as listed in HPI above).  Objective  Vitals:   12/04/16 1512 12/04/16 1526  BP: (!) 152/82 (!) 152/99  Pulse: 96   Resp: 16   Temp: 98.1 F (36.7 C)   SpO2: 97%   Weight: 215 lb 4 oz (97.6 kg)   Height: '5\' 5"'  (1.651 m)     Body mass index is 35.82 kg/m.  Physical Exam  Constitutional: Patient appears well-developed and well-nourished. Obese  No distress.  HEENT: head atraumatic, normocephalic, pupils equal and reactive to light,  neck supple, throat within normal limits Cardiovascular: Normal rate, regular rhythm and normal heart sounds.  No murmur heard. No BLE edema. Pulmonary/Chest: Effort normal and breath sounds normal. No respiratory distress. Abdominal: Soft.  There is no tenderness. Psychiatric: Patient has a normal mood and affect. behavior is normal. Judgment and thought content normal.  Recent Results (from the past 2160 hour(s))  COMPLETE METABOLIC PANEL WITH GFR     Status: Abnormal   Collection Time: 11/06/16 11:00 AM  Result Value Ref Range   Sodium 138 135 - 146 mmol/L   Potassium 3.9 3.5 - 5.3 mmol/L   Chloride 103 98 - 110  mmol/L   CO2 24 20 - 31 mmol/L   Glucose, Bld 305 (H) 65 - 99 mg/dL   BUN 10 7 - 25 mg/dL   Creat 0.73 0.50 - 0.99 mg/dL    Comment:   For patients > or = 64 years of age: The upper reference limit for Creatinine is approximately 13% higher for people identified as African-American.      Total Bilirubin 0.5 0.2 - 1.2 mg/dL   Alkaline Phosphatase 134 (H) 33 - 130 U/L   AST 15 10 - 35 U/L   ALT 15 6 - 29 U/L   Total Protein 6.9 6.1 - 8.1 g/dL   Albumin  3.6 3.6 - 5.1 g/dL   Calcium 9.2 8.6 - 10.4 mg/dL   GFR, Est African American >89 >=60 mL/min   GFR, Est Non African American 88 >=60 mL/min  CBC with Differential/Platelet     Status: None   Collection Time: 11/06/16 11:00 AM  Result Value Ref Range   WBC 8.4 3.8 - 10.8 K/uL   RBC 4.85 3.80 - 5.10 MIL/uL   Hemoglobin 14.9 11.7 - 15.5 g/dL   HCT 43.3 35.0 - 45.0 %   MCV 89.3 80.0 - 100.0 fL   MCH 30.7 27.0 - 33.0 pg   MCHC 34.4 32.0 - 36.0 g/dL   RDW 13.4 11.0 - 15.0 %   Platelets 273 140 - 400 K/uL   MPV 10.1 7.5 - 12.5 fL   Neutro Abs 5,544 1,500 - 7,800 cells/uL   Lymphs Abs 1,932 850 - 3,900 cells/uL   Monocytes Absolute 756 200 - 950 cells/uL   Eosinophils Absolute 168 15 - 500 cells/uL   Basophils Absolute 0 0 - 200 cells/uL   Neutrophils Relative % 66 %   Lymphocytes Relative 23 %   Monocytes Relative 9 %   Eosinophils Relative 2 %   Basophils Relative 0 %   Smear Review Criteria for review not met   Lipid panel     Status: Abnormal   Collection Time: 11/06/16 11:00 AM  Result Value Ref Range   Cholesterol 198 <200 mg/dL   Triglycerides 133 <150 mg/dL   HDL 32 (L) >50 mg/dL   Total CHOL/HDL Ratio 6.2 (H) <5.0 Ratio   VLDL 27 <30 mg/dL   LDL Cholesterol 139 (H) <100 mg/dL  TSH     Status: None   Collection Time: 11/06/16 11:00 AM  Result Value Ref Range   TSH 0.92 mIU/L    Comment:   Reference Range   > or = 20 Years  0.40-4.50   Pregnancy Range First trimester  0.26-2.66 Second trimester  0.55-2.73 Third trimester  0.43-2.91     Hemoglobin A1c     Status: Abnormal   Collection Time: 11/06/16 11:00 AM  Result Value Ref Range   Hgb A1c MFr Bld 10.7 (H) <5.7 %    Comment:   For someone without known diabetes, a hemoglobin A1c value of 6.5% or greater indicates that they may have diabetes and this should be confirmed with a follow-up test.   For someone with known diabetes, a value <7% indicates that their diabetes is well controlled and a value greater than or equal to 7% indicates suboptimal control. A1c targets should be individualized based on duration of diabetes, age, comorbid conditions, and other considerations.   Currently, no consensus exists for use of hemoglobin A1c for diagnosis of diabetes for children.      Mean Plasma Glucose 260 mg/dL     PHQ2/9: Depression screen [facility] 2/9 12/04/2016 07/06/2016 12/06/2015 10/06/2015  Decreased Interest 0 0 0 0  Down, Depressed, Hopeless 0 0 0 0  PHQ - 2 Score 0 0 0 0    Fall Risk: Fall Risk  12/04/2016 07/06/2016 12/06/2015 10/06/2015  Falls in the past year? No No No No    Functional Status Survey: Is the patient deaf or have difficulty hearing?: No Does the patient have difficulty seeing, even when wearing glasses/contacts?: No Does the patient have difficulty concentrating, remembering, or making decisions?: No Does the patient have difficulty walking or climbing stairs?: No Does the patient have difficulty dressing or bathing?: No Does the patient have  difficulty doing errands alone such as visiting a doctor's office or shopping?: No    Assessment & Plan  1. Type 2 diabetes, uncontrolled, with neuropathy ([facility])  - metformin (FORTAMET) 1000 MG (OSM) 24 hr tablet; Take 1 tablet (1,000 mg total) by mouth daily with breakfast.  Dispense: 30 tablet; Refill: 1 - Ambulatory referral to Endocrinology  Advised referral to dietician since she does not understand a diabetic diet but she wants to hold off for  now  2. Hypertension, uncontrolled  She needs to resume taking medication   3. Vaginal pruritus  - fluconazole (DIFLUCAN) 150 MG tablet; Take 1 tablet (150 mg total) by mouth every other day.  Dispense: 3 tablet; Refill: 0  4. OSA (obstructive sleep apnea)  She has been compliant with CPAP   5. Dyslipidemia associated with type 2 diabetes mellitus ([facility])  Lipid panel shows low HDL : advised her to eat tree nuts ( pecans/pistachios/almonds ) four times weekly, eat fish two times weekly  and exercise  at least 150 minutes per week She needs to take medication daily    6. Uncontrolled type 2 diabetes mellitus with microalbuminuria, without long-term current use of insulin ([facility])  Continue ARB, needs to increase compliance with medication

## 2016-12-05 ENCOUNTER — Other Ambulatory Visit: Payer: Self-pay | Admitting: Family Medicine

## 2016-12-05 NOTE — Telephone Encounter (Signed)
Was prescribed metformin 1000 extended release and it will cost her $400. The pharmacy suggested taking metformin two 500 extended release. Please send new script to [facility]

## 2016-12-06 ENCOUNTER — Other Ambulatory Visit: Payer: Self-pay | Admitting: Family Medicine

## 2016-12-06 ENCOUNTER — Other Ambulatory Visit: Payer: Self-pay

## 2016-12-06 MED ORDER — METFORMIN HCL ER 750 MG PO TB24: 1500.0000 mg | ORAL_TABLET | Freq: Every day | ORAL | 0 refills | Status: DC

## 2016-12-07 ENCOUNTER — Other Ambulatory Visit: Payer: Self-pay

## 2016-12-07 DIAGNOSIS — Z1231 Encounter for screening mammogram for malignant neoplasm of breast: Secondary | ICD-10-CM

## 2016-12-11 ENCOUNTER — Other Ambulatory Visit: Payer: Self-pay

## 2016-12-11 DIAGNOSIS — E785 Hyperlipidemia, unspecified: Secondary | ICD-10-CM

## 2016-12-11 MED ORDER — ROSUVASTATIN CALCIUM 10 MG PO TABS: 10.0000 mg | ORAL_TABLET | Freq: Every day | ORAL | 1 refills | Status: DC

## 2016-12-11 NOTE — Telephone Encounter (Signed)
Patient requesting refill of Crestor for a 90 day supply to [facility].

## 2017-02-12 ENCOUNTER — Other Ambulatory Visit: Payer: Self-pay | Admitting: Family Medicine

## 2017-02-12 DIAGNOSIS — I10 Essential (primary) hypertension: Secondary | ICD-10-CM

## 2017-02-12 DIAGNOSIS — E1121 Type 2 diabetes mellitus with diabetic nephropathy: Secondary | ICD-10-CM

## 2017-02-13 NOTE — Telephone Encounter (Signed)
Patient requesting refill if Hyzaar to [facility].

## 2017-02-18 ENCOUNTER — Ambulatory Visit: Payer: BC Managed Care – PPO | Admitting: Family Medicine

## 2017-02-22 ENCOUNTER — Ambulatory Visit
Admission: RE | Admit: 2017-02-22 | Discharge: 2017-02-22 | Disposition: A | Discharge status: Home / Self Care | Payer: BC Managed Care – PPO | Source: Ambulatory Visit | Attending: General Surgery | Admitting: General Surgery

## 2017-02-22 DIAGNOSIS — Z1231 Encounter for screening mammogram for malignant neoplasm of breast: Secondary | ICD-10-CM | POA: Insufficient documentation

## 2017-03-05 ENCOUNTER — Ambulatory Visit (INDEPENDENT_AMBULATORY_CARE_PROVIDER_SITE_OTHER): Payer: BC Managed Care – PPO | Admitting: General Surgery

## 2017-03-05 ENCOUNTER — Encounter: Payer: Self-pay | Admitting: General Surgery

## 2017-03-05 ENCOUNTER — Other Ambulatory Visit: Payer: Self-pay

## 2017-03-05 VITALS — BP 144/84 | HR 80 | Resp 12 | Ht 64.5 in | Wt 212.0 lb

## 2017-03-05 DIAGNOSIS — Z9889 Other specified postprocedural states: Secondary | ICD-10-CM

## 2017-03-05 DIAGNOSIS — I6521 Occlusion and stenosis of right carotid artery: Secondary | ICD-10-CM

## 2017-03-05 NOTE — Progress Notes (Signed)
Patient ID: Meagan Meagan [name], female   DOB: 12-15-1950, 66 y.o.   MRN: [id number]  Chief Complaint  Patient presents with  . Follow-up    HPI Meagan Meagan [name] is a 66 y.o. female.  who presents for a breast evaluation. The most recent mammogram was done on . Follow up carotid ultrasound as well. She does admit to occasional difficulty swallowing (coughing) liquids on left side but not with every meal. Denies dizziness or headaches. She denies any new breast issues. Patient does perform regular self breast checks and gets regular mammograms done.     HPI  Past Medical History:  Diagnosis Date  . Cellulitis    left leg  . 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 Diffuse cystic mastopathy   . Fall 2013  . Hypertension 2004  . Occlusion and stenosis of carotid artery without mention of cerebral infarction   . Sleep apnea     Past Surgical History:  Procedure Laterality Date  . APPENDECTOMY    . BIOPSY THYROID  2006   [facility]  . BREAST BIOPSY Left 10+ yrs ago   neg  . CARPAL TUNNEL RELEASE Left March 2015  . COLONOSCOPY  2011   [facility] [facility]  March 2015  . ganglion cyst removal     . KNEE SURGERY Right 2010  . ROTATOR CUFF REPAIR  2008  . TUBAL LIGATION      Family History  Problem Relation Age of Onset  . Breast cancer Sister 30  . Breast cancer Maternal Aunt     Social History Social History  Substance Use Topics  . Smoking status: Never Smoker  . Smokeless tobacco: Never Used  . Alcohol use No    Allergies  Allergen Reactions  . Keflex [Cephalexin] Rash    Current Outpatient Prescriptions  Medication Sig Dispense Refill  . aspirin 81 MG tablet Take 81 mg by mouth daily.    . Dulaglutide (TRULICITY) 1.5 PF/7.9KW SOPN INJECT 1.5 MG ( ONE PEN) INTO THE SKIN EVERY WEEK 4 pen 2  . fluticasone (FLONASE) 50 MCG/ACT nasal spray Place 2 sprays into both nostrils daily. 16 g 0  . levocetirizine (XYZAL) 5 MG tablet Take 5 mg by  mouth as needed for allergies.    Marland Kitchen losartan-hydrochlorothiazide (HYZAAR) 100-25 MG tablet TAKE 1 TABLET BY MOUTH DAILY. 30 tablet 0  . metFORMIN (GLUCOPHAGE XR) 750 MG 24 hr tablet Take 2 tablets (1,500 mg total) by mouth daily with breakfast. 180 tablet 0  . montelukast (SINGULAIR) 10 MG tablet Take 1 tablet (10 mg total) by mouth at bedtime. 30 tablet 2  . ONE TOUCH ULTRA TEST test strip USE AS DIRECTED TWICE A DAY 50 each 5  . rosuvastatin (CRESTOR) 10 MG tablet Take 1 tablet (10 mg total) by mouth daily. 90 tablet 1   No current facility-administered medications for this visit.     Review of Systems Review of Systems  Constitutional: Negative.   Respiratory: Negative.   Cardiovascular: Negative.     Blood pressure (!) 144/84, pulse 80, resp. rate 12, height 5' 4.5" (1.638 m), weight 212 lb (96.2 kg).  Physical Exam Physical Exam  Constitutional: She is oriented to person, place, and time. She appears well-developed and well-nourished.  HENT:  Mouth/Throat: Oropharynx is clear and moist.  Eyes: Conjunctivae are normal. No scleral icterus.  Neck: Neck supple.  Cardiovascular: Normal rate, regular rhythm and normal heart sounds.   Pulmonary/Chest: Effort normal  and breath sounds normal. Right breast exhibits no inverted nipple, no mass, no nipple discharge, no skin change and no tenderness. Left breast exhibits no inverted nipple, no mass, no nipple discharge, no skin change and no tenderness.  Abdominal: Soft. Bowel sounds are normal. There is no tenderness.  Lymphadenopathy:    She has no cervical adenopathy.    She has no axillary adenopathy.  Neurological: She is alert and oriented to person, place, and time.  Skin: Skin is warm and Meagan [name].  Psychiatric: Her behavior is normal.    Data Reviewed Mammogram reviewed and stable. Prior notes and carotid doppler  Assessment    Stable physical exam Carotid doppler, right carotid stenosis-stable for several yrs. No associated  symptoms    Plan    Patient will be asked to follow up in one year with a bilateral screening mammogram with her PCP, Dr Ancil Boozer. Follow up carotid stenosis with [facility] in 2020.      HPI, Physical Exam, Assessment and Plan have been scribed under the direction and in the presence of Mckinley Jewel, MD  Karie Fetch, RN  I have completed the exam and reviewed the above documentation for accuracy and completeness.  I agree with the above.  Haematologist has been used and any errors in dictation or transcription are unintentional.  Andrell Tallman G. Jamal Collin, M.D., F.A.C.S.  Junie Panning G 03/05/2017, 4:03 PM

## 2017-03-05 NOTE — Patient Instructions (Addendum)
The patient is aware to call back for any questions or new concerns.    Patient will be asked to follow up in one year with a bilateral screening mammogram with her PCP, Dr Ancil Boozer. Follow up carotid stenosis with [facility] in 2020.

## 2017-03-08 ENCOUNTER — Encounter: Payer: Self-pay | Admitting: Family Medicine

## 2017-03-08 ENCOUNTER — Telehealth: Payer: Self-pay | Admitting: Family Medicine

## 2017-03-08 DIAGNOSIS — IMO0002 Reserved for concepts with insufficient information to code with codable children: Secondary | ICD-10-CM

## 2017-03-08 DIAGNOSIS — E1140 Type 2 diabetes mellitus with diabetic neuropathy, unspecified: Secondary | ICD-10-CM

## 2017-03-08 DIAGNOSIS — E1165 Type 2 diabetes mellitus with hyperglycemia: Principal | ICD-10-CM

## 2017-03-08 NOTE — Telephone Encounter (Signed)
PT MADE APPT April 02 2017 AND WAS INFORMED OF HER MEDICATION REFILL

## 2017-03-08 NOTE — Telephone Encounter (Signed)
Please call pt and schedule 84mo f/u in the next 30 days. I have provided a 30 day supply of Metformin.  Thank you!

## 2017-03-29 ENCOUNTER — Other Ambulatory Visit: Payer: Self-pay | Admitting: Family Medicine

## 2017-03-29 DIAGNOSIS — E1121 Type 2 diabetes mellitus with diabetic nephropathy: Secondary | ICD-10-CM

## 2017-03-29 DIAGNOSIS — I10 Essential (primary) hypertension: Secondary | ICD-10-CM

## 2017-03-29 NOTE — Telephone Encounter (Signed)
Patient requesting refill of Losartan-HCTZ to [facility].  

## 2017-04-02 ENCOUNTER — Ambulatory Visit: Payer: BC Managed Care – PPO | Admitting: Family Medicine

## 2017-04-03 ENCOUNTER — Ambulatory Visit: Payer: BC Managed Care – PPO | Admitting: Family Medicine

## 2017-04-08 ENCOUNTER — Encounter: Payer: Self-pay | Admitting: Family Medicine

## 2017-04-08 ENCOUNTER — Other Ambulatory Visit: Payer: Self-pay | Admitting: Family Medicine

## 2017-04-08 DIAGNOSIS — E1165 Type 2 diabetes mellitus with hyperglycemia: Principal | ICD-10-CM

## 2017-04-08 DIAGNOSIS — E1140 Type 2 diabetes mellitus with diabetic neuropathy, unspecified: Secondary | ICD-10-CM

## 2017-04-08 DIAGNOSIS — IMO0002 Reserved for concepts with insufficient information to code with codable children: Secondary | ICD-10-CM

## 2017-04-08 NOTE — Telephone Encounter (Signed)
Please inform patient she needs to make and keep a follow up appt with Dr. Ancil Boozer to obtain further refills. I provided a 30-day supply to allow for this on 03/08/2017 and she canceled her appointment on 04/02/2017 and 04/03/2017.

## 2017-04-09 NOTE — Telephone Encounter (Signed)
Please schedule patient an appt

## 2017-04-09 NOTE — Telephone Encounter (Signed)
LMOM to inform pt °

## 2017-04-20 ENCOUNTER — Telehealth: Payer: Self-pay | Admitting: Family Medicine

## 2017-04-20 DIAGNOSIS — IMO0002 Reserved for concepts with insufficient information to code with codable children: Secondary | ICD-10-CM

## 2017-04-20 DIAGNOSIS — E1165 Type 2 diabetes mellitus with hyperglycemia: Principal | ICD-10-CM

## 2017-04-20 DIAGNOSIS — E1140 Type 2 diabetes mellitus with diabetic neuropathy, unspecified: Secondary | ICD-10-CM

## 2017-04-23 NOTE — Telephone Encounter (Signed)
Have tried contacting patient twice and each time it says mailbox full. Not able to leave message

## 2017-05-11 ENCOUNTER — Other Ambulatory Visit: Payer: Self-pay | Admitting: Family Medicine

## 2017-05-11 DIAGNOSIS — I10 Essential (primary) hypertension: Secondary | ICD-10-CM

## 2017-05-11 DIAGNOSIS — E1121 Type 2 diabetes mellitus with diabetic nephropathy: Secondary | ICD-10-CM

## 2017-05-13 NOTE — Telephone Encounter (Signed)
Patient requesting refill of Losartan-HCTZ to [facility].  

## 2017-06-05 ENCOUNTER — Ambulatory Visit: Payer: BC Managed Care – PPO | Admitting: Family Medicine

## 2017-06-11 ENCOUNTER — Ambulatory Visit: Payer: BC Managed Care – PPO | Admitting: Family Medicine

## 2017-06-12 ENCOUNTER — Ambulatory Visit (INDEPENDENT_AMBULATORY_CARE_PROVIDER_SITE_OTHER): Payer: BC Managed Care – PPO | Admitting: Family Medicine

## 2017-06-12 ENCOUNTER — Encounter: Payer: Self-pay | Admitting: Family Medicine

## 2017-06-12 VITALS — HR 98 | Temp 97.9°F | Resp 16 | Ht 65.0 in | Wt 208.0 lb

## 2017-06-12 DIAGNOSIS — Z1211 Encounter for screening for malignant neoplasm of colon: Secondary | ICD-10-CM

## 2017-06-12 DIAGNOSIS — R809 Proteinuria, unspecified: Secondary | ICD-10-CM

## 2017-06-12 DIAGNOSIS — IMO0002 Reserved for concepts with insufficient information to code with codable children: Secondary | ICD-10-CM

## 2017-06-12 DIAGNOSIS — E1140 Type 2 diabetes mellitus with diabetic neuropathy, unspecified: Secondary | ICD-10-CM | POA: Diagnosis not present

## 2017-06-12 DIAGNOSIS — I10 Essential (primary) hypertension: Secondary | ICD-10-CM

## 2017-06-12 DIAGNOSIS — G4733 Obstructive sleep apnea (adult) (pediatric): Secondary | ICD-10-CM | POA: Diagnosis not present

## 2017-06-12 DIAGNOSIS — Z23 Encounter for immunization: Secondary | ICD-10-CM | POA: Diagnosis not present

## 2017-06-12 DIAGNOSIS — E1169 Type 2 diabetes mellitus with other specified complication: Secondary | ICD-10-CM

## 2017-06-12 DIAGNOSIS — E1165 Type 2 diabetes mellitus with hyperglycemia: Secondary | ICD-10-CM | POA: Diagnosis not present

## 2017-06-12 DIAGNOSIS — E785 Hyperlipidemia, unspecified: Secondary | ICD-10-CM

## 2017-06-12 DIAGNOSIS — E1129 Type 2 diabetes mellitus with other diabetic kidney complication: Secondary | ICD-10-CM | POA: Diagnosis not present

## 2017-06-12 LAB — POCT UA - MICROALBUMIN: Microalbumin Ur, POC: 100 mg/L

## 2017-06-12 LAB — POCT GLYCOSYLATED HEMOGLOBIN (HGB A1C): Hemoglobin A1C: 12

## 2017-06-12 MED ORDER — METFORMIN HCL ER 750 MG PO TB24: 1500.0000 mg | ORAL_TABLET | Freq: Every day | ORAL | 0 refills | Status: DC

## 2017-06-12 MED ORDER — OLMESARTAN MEDOXOMIL-HCTZ 40-25 MG PO TABS: 1.0000 | ORAL_TABLET | Freq: Every day | ORAL | 1 refills | Status: DC

## 2017-06-12 MED ORDER — DULAGLUTIDE 1.5 MG/0.5ML ~~LOC~~ SOAJ: SUBCUTANEOUS | 0 refills | Status: DC

## 2017-06-12 NOTE — Progress Notes (Signed)
Name: Meagan [name]   MRN: [id number]    DOB: 06/05/1951   Date:06/12/2017       Progress Note  Subjective  Chief Complaint  Chief Complaint  Patient presents with  . Medication Refill    6 month F/U  . Diabetes    Checks daily, Lowest-170 is trying to actively lose weight has lost 4 pounds since last visit.  Marland Kitchen Hypertension    Denies any symptoms  . Hyperlipidemia  . Obesity    Patient is walking daily and trying to eat healthier.    HPI  DMII: she is has been more compliant with diet and exercise but not with medication. Out of Trulicity for months and takes Metformin occasionally. HgbA1C went from 10.7 to 12 today.  She has microalbuminuria and is taking ARB- states no longer skipping bp medication. . She also has a history of  neuropathy but stopped Gabapentin because symptoms improved. She is due for eye exam and tells me she will schedule an appointment every time she comes in. She denies polyphagia, polydipsia or polyuria or nocturia. Discussed again importance of compliance with medication and life style modification to decrease her risk of heart attack, strokes, amputation, kidney failure or blindness.She agrees in seeing Endocrinologist at this time, referral will be made  HTN: bp is elevated today, she states she has been taking medication daily, but bp is still elevated  Hyperlipidemia:she is on Crestor at this time, she should be out of medication based on refill request, but she states she is taking it daly   Obesity: she has been losing weight, but likely from uncontrolled DM  OSA: she is wearing her CPAP machine every night, she denies morning headaches but still feels tired during the day, but not daily     Patient Active Problem List   Diagnosis Date Noted  . Dyslipidemia associated with type 2 diabetes mellitus ([place]) 12/04/2016  . Uncontrolled type 2 diabetes mellitus with microalbuminuria, without long-term current use of insulin ([place]) 12/04/2016  . Seasonal  allergies 02/01/2016  . OSA (obstructive sleep apnea) 10/06/2015  . Hypertensive retinopathy 10/06/2015  . Thyroid cyst 10/06/2015  . Dyslipidemia 10/06/2015  . Type 2 diabetes, uncontrolled, with neuropathy ([place]) 10/06/2015  . History of pneumonia 10/06/2015  . Chronic neck pain 10/06/2015  . Occlusion and stenosis of carotid artery without mention of cerebral infarction   . Hypertension, benign     Past Surgical History:  Procedure Laterality Date  . APPENDECTOMY    . BIOPSY THYROID  2006   [facility]  . BREAST BIOPSY Left 10+ yrs ago   neg  . CARPAL TUNNEL RELEASE Left March 2015  . COLONOSCOPY  2011   [facility] [facility]GERY  March 2015  . ganglion cyst removal     . KNEE SURGERY Right 2010  . ROTATOR CUFF REPAIR  2008  . TUBAL LIGATION      Family History  Problem Relation Age of Onset  . Breast cancer Sister 60  . Breast cancer Maternal Aunt     Social History   Social History  . Marital status: Widowed    Spouse name: N/A  . Number of children: N/A  . Years of education: N/A   Occupational History  . Not on file.   Social History Main Topics  . Smoking status: Never Smoker  . Smokeless tobacco: Never Used  . Alcohol use No  . Drug use: No  . Sexual activity: Not on file  Other Topics Concern  . Not on file   Social History Narrative  . No narrative on file     Current Outpatient Prescriptions:  .  aspirin 81 MG tablet, Take 81 mg by mouth daily., Disp: , Rfl:  .  Dulaglutide (TRULICITY) 1.5 EU/2.3NT SOPN, INJECT 1.5 MG ( ONE PEN) INTO THE SKIN EVERY WEEK, Disp: 4 pen, Rfl: 0 .  fluticasone (FLONASE) 50 MCG/ACT nasal spray, Place 2 sprays into both nostrils daily., Disp: 16 g, Rfl: 0 .  levocetirizine (XYZAL) 5 MG tablet, Take 5 mg by mouth as needed for allergies., Disp: , Rfl:  .  metFORMIN (GLUCOPHAGE-XR) 750 MG 24 hr tablet, Take 2 tablets (1,500 mg total) by mouth daily with breakfast., Disp: 60 tablet, Rfl: 0 .  montelukast (SINGULAIR) 10 MG  tablet, Take 1 tablet (10 mg total) by mouth at bedtime., Disp: 30 tablet, Rfl: 2 .  ONE TOUCH ULTRA TEST test strip, USE AS DIRECTED TWICE A DAY, Disp: 50 each, Rfl: 5 .  rosuvastatin (CRESTOR) 10 MG tablet, Take 1 tablet (10 mg total) by mouth daily., Disp: 90 tablet, Rfl: 1 .  olmesartan-hydrochlorothiazide (BENICAR HCT) 40-25 MG tablet, Take 1 tablet by mouth daily., Disp: 90 tablet, Rfl: 1  Allergies  Allergen Reactions  . Keflex [Cephalexin] Rash     ROS  Constitutional: Negative for fever, positive for  weight change.  Respiratory: Negative for cough and shortness of breath.   Cardiovascular: Negative for chest pain or palpitations.  Gastrointestinal: Negative for abdominal pain, no bowel changes.  Musculoskeletal: Negative for gait problem or joint swelling.  Skin: Negative for rash.  Neurological: Negative for dizziness or headache.  No other specific complaints in a complete review of systems (except as listed in HPI above).  Objective  Vitals:   06/12/17 1537  Pulse: 98  Resp: 16  Temp: 97.9 F (36.6 C)  TempSrc: Oral  SpO2: 98%  Weight: 208 lb (94.3 kg)  Height: 5\' 5"  (1.651 m)    Body mass index is 34.61 kg/m.  Physical Exam  Constitutional: Patient appears well-developed and well-nourished. Obese No distress.  HEENT: head atraumatic, normocephalic, pupils equal and reactive to light, neck supple, throat within normal limits Cardiovascular: Normal rate, regular rhythm and normal heart sounds.  No murmur heard. No BLE edema. Pulmonary/Chest: Effort normal and breath sounds normal. No respiratory distress. Abdominal: Soft.  There is no tenderness. Psychiatric: Patient has a normal mood and affect. behavior is normal. Judgment and thought content normal.  Recent Results (from the past 2160 hour(s))  POCT HgB A1C     Status: None   Collection Time: 06/12/17  3:48 PM  Result Value Ref Range   Hemoglobin A1C 12.0   POCT UA - Microalbumin     Status: None    Collection Time: 06/12/17  3:48 PM  Result Value Ref Range   Microalbumin Ur, POC 100 mg/L   Creatinine, POC  mg/dL   Albumin/Creatinine Ratio, Urine, POC      Diabetic Foot Exam: Diabetic Foot Exam - Simple   Simple Foot Form Diabetic Foot exam was performed with the following findings:  Yes 06/12/2017  4:16 PM  Visual Inspection See comments:  Yes Sensation Testing Intact to touch and monofilament testing bilaterally:  Yes Pulse Check Posterior Tibialis and Dorsalis pulse intact bilaterally:  Yes Comments She has bunion on both feet      PHQ2/9: Depression screen [facility] 2/9 06/12/2017 12/04/2016 07/06/2016 12/06/2015 10/06/2015  Decreased Interest 0 0 0  0 0  Down, Depressed, Hopeless 0 0 0 0 0  PHQ - 2 Score 0 0 0 0 0     Fall Risk: Fall Risk  06/12/2017 12/04/2016 07/06/2016 12/06/2015 10/06/2015  Falls in the past year? No No No No No     Functional Status Survey: Is the patient deaf or have difficulty hearing?: No Does the patient have difficulty seeing, even when wearing glasses/contacts?: No Does the patient have difficulty concentrating, remembering, or making decisions?: No Does the patient have difficulty walking or climbing stairs?: No Does the patient have difficulty dressing or bathing?: No Does the patient have difficulty doing errands alone such as visiting a doctor's office or shopping?: No    Assessment & Plan  1. Type 2 diabetes, uncontrolled, with neuropathy (HCC)  - POCT HgB A1C - POCT UA - Microalbumin - Ambulatory referral to Endocrinology - Dulaglutide (TRULICITY) 1.5 ZJ/6.9CV SOPN; INJECT 1.5 MG ( ONE PEN) INTO THE SKIN EVERY WEEK  Dispense: 4 pen; Refill: 0 - metFORMIN (GLUCOPHAGE-XR) 750 MG 24 hr tablet; Take 2 tablets (1,500 mg total) by mouth daily with breakfast.  Dispense: 60 tablet; Refill: 0 She refuses to take insulin, discussed glucose toxicity, but she wants to continue Metformin ( but will try to take it daily) and will resume Trulicity  weekly   2. Hypertension, uncontrolled  - olmesartan-hydrochlorothiazide (BENICAR HCT) 40-25 MG tablet; Take 1 tablet by mouth daily.  Dispense: 90 tablet; Refill: 1 - COMPLETE METABOLIC PANEL WITH GFR  3. OSA (obstructive sleep apnea)  She has been wearing CPAP every night   4. Dyslipidemia associated with type 2 diabetes mellitus ([place])  - Ambulatory referral to Endocrinology - Lipid panel  5. Uncontrolled type 2 diabetes mellitus with microalbuminuria, without long-term current use of insulin ([place])  - Ambulatory referral to Endocrinology - Dulaglutide (TRULICITY) 1.5 EL/3.8BO SOPN; INJECT 1.5 MG ( ONE PEN) INTO THE SKIN EVERY WEEK  Dispense: 4 pen; Refill: 0  6. Dyslipidemia  Make sure takes medication daily   7. Needs flu shot  refused  8. Need for vaccination for Strep pneumoniae   refused  9. Screen for colon cancer  - Ambulatory referral to Gastroenterology

## 2017-06-21 ENCOUNTER — Telehealth: Payer: Self-pay

## 2017-06-21 NOTE — Telephone Encounter (Signed)
Gastroenterology Pre-Procedure Review  Request Date: 07/11/17 Requesting Physician: Dr. Vicente Males  PATIENT REVIEW QUESTIONS: The patient responded to the following health history questions as indicated:    1. Are you having any GI issues? no 2. Do you have a personal history of Polyps? no 3. Do you have a family history of Colon Cancer or Polyps? no 4. Diabetes Mellitus? Yes  5. Joint replacements in the past 12 months?no 6. Major health problems in the past 3 months?no 7. Any artificial heart valves, MVP, or defibrillator?no    MEDICATIONS & ALLERGIES:    Patient reports the following regarding taking any anticoagulation/antiplatelet therapy:   Plavix, Coumadin, Eliquis, Xarelto, Lovenox, Pradaxa, Brilinta, or Effient? no Aspirin? yes (81 mg)  Patient confirms/reports the following medications:  Current Outpatient Prescriptions  Medication Sig Dispense Refill  . aspirin 81 MG tablet Take 81 mg by mouth daily.    . Dulaglutide (TRULICITY) 1.5 IA/1.6PV SOPN INJECT 1.5 MG ( ONE PEN) INTO THE SKIN EVERY WEEK 4 pen 0  . fluticasone (FLONASE) 50 MCG/ACT nasal spray Place 2 sprays into both nostrils daily. 16 g 0  . levocetirizine (XYZAL) 5 MG tablet Take 5 mg by mouth as needed for allergies.    . metFORMIN (GLUCOPHAGE-XR) 750 MG 24 hr tablet Take 2 tablets (1,500 mg total) by mouth daily with breakfast. 60 tablet 0  . montelukast (SINGULAIR) 10 MG tablet Take 1 tablet (10 mg total) by mouth at bedtime. 30 tablet 2  . olmesartan-hydrochlorothiazide (BENICAR HCT) 40-25 MG tablet Take 1 tablet by mouth daily. 90 tablet 1  . ONE TOUCH ULTRA TEST test strip USE AS DIRECTED TWICE A DAY 50 each 5  . rosuvastatin (CRESTOR) 10 MG tablet Take 1 tablet (10 mg total) by mouth daily. 90 tablet 1   No current facility-administered medications for this visit.     Patient confirms/reports the following allergies:  Allergies  Allergen Reactions  . Keflex [Cephalexin] Rash    No orders of the defined  types were placed in this encounter.   AUTHORIZATION INFORMATION Primary Insurance: 1D#: Group #:  Secondary Insurance: 1D#: Group #:  SCHEDULE INFORMATION: Date: 07/11/17 Time: Location:[facility]

## 2017-06-24 ENCOUNTER — Other Ambulatory Visit: Payer: Self-pay

## 2017-06-24 DIAGNOSIS — Z1211 Encounter for screening for malignant neoplasm of colon: Secondary | ICD-10-CM

## 2017-07-10 ENCOUNTER — Other Ambulatory Visit: Payer: Self-pay | Admitting: Family Medicine

## 2017-07-11 ENCOUNTER — Ambulatory Visit: Payer: BC Managed Care – PPO | Admitting: Anesthesiology

## 2017-07-11 ENCOUNTER — Encounter
Admission: RE | Disposition: A | Discharge status: Home / Self Care | Payer: Self-pay | Source: Ambulatory Visit | Attending: Gastroenterology

## 2017-07-11 ENCOUNTER — Ambulatory Visit
Admission: RE | Admit: 2017-07-11 | Discharge: 2017-07-11 | Disposition: A | Discharge status: Home / Self Care | Payer: BC Managed Care – PPO | Source: Ambulatory Visit | Attending: Gastroenterology | Admitting: Gastroenterology

## 2017-07-11 ENCOUNTER — Encounter: Payer: Self-pay | Admitting: *Deleted

## 2017-07-11 DIAGNOSIS — Z9989 Dependence on other enabling machines and devices: Secondary | ICD-10-CM | POA: Insufficient documentation

## 2017-07-11 DIAGNOSIS — G4730 Sleep apnea, unspecified: Secondary | ICD-10-CM | POA: Diagnosis not present

## 2017-07-11 DIAGNOSIS — K640 First degree hemorrhoids: Secondary | ICD-10-CM | POA: Diagnosis not present

## 2017-07-11 DIAGNOSIS — Z79899 Other long term (current) drug therapy: Secondary | ICD-10-CM | POA: Insufficient documentation

## 2017-07-11 DIAGNOSIS — I10 Essential (primary) hypertension: Secondary | ICD-10-CM | POA: Insufficient documentation

## 2017-07-11 DIAGNOSIS — E119 Type 2 diabetes mellitus without complications: Secondary | ICD-10-CM | POA: Insufficient documentation

## 2017-07-11 DIAGNOSIS — Z7982 Long term (current) use of aspirin: Secondary | ICD-10-CM | POA: Diagnosis not present

## 2017-07-11 DIAGNOSIS — D124 Benign neoplasm of descending colon: Secondary | ICD-10-CM | POA: Diagnosis not present

## 2017-07-11 DIAGNOSIS — Z7951 Long term (current) use of inhaled steroids: Secondary | ICD-10-CM | POA: Diagnosis not present

## 2017-07-11 DIAGNOSIS — Z1211 Encounter for screening for malignant neoplasm of colon: Secondary | ICD-10-CM | POA: Diagnosis present

## 2017-07-11 DIAGNOSIS — Z7984 Long term (current) use of oral hypoglycemic drugs: Secondary | ICD-10-CM | POA: Insufficient documentation

## 2017-07-11 HISTORY — PX: COLONOSCOPY WITH PROPOFOL: SHX5780

## 2017-07-11 LAB — GLUCOSE, CAPILLARY: Glucose-Capillary: 214 mg/dL — ABNORMAL HIGH (ref 65–99)

## 2017-07-11 SURGERY — COLONOSCOPY WITH PROPOFOL
Anesthesia: General

## 2017-07-11 MED ORDER — PROPOFOL 500 MG/50ML IV EMUL
INTRAVENOUS | Status: DC | PRN
  Administered 2017-07-11: 120 ug/kg/min via INTRAVENOUS

## 2017-07-11 MED ORDER — PROPOFOL 500 MG/50ML IV EMUL
INTRAVENOUS | Status: AC
  Filled 2017-07-11: qty 50

## 2017-07-11 MED ORDER — PHENYLEPHRINE HCL 10 MG/ML IJ SOLN
INTRAMUSCULAR | Status: AC
  Filled 2017-07-11: qty 1

## 2017-07-11 MED ORDER — MIDAZOLAM HCL 2 MG/2ML IJ SOLN
INTRAMUSCULAR | Status: AC
  Filled 2017-07-11: qty 2

## 2017-07-11 MED ORDER — SODIUM CHLORIDE 0.9 % IV SOLN
INTRAVENOUS | Status: DC
  Administered 2017-07-11: 10:00:00 via INTRAVENOUS

## 2017-07-11 MED ORDER — FENTANYL CITRATE (PF) 100 MCG/2ML IJ SOLN
INTRAMUSCULAR | Status: DC | PRN
  Administered 2017-07-11 (×2): 25 ug via INTRAVENOUS
  Administered 2017-07-11: 50 ug via INTRAVENOUS

## 2017-07-11 MED ORDER — LIDOCAINE HCL (PF) 1 % IJ SOLN
INTRAMUSCULAR | Status: AC
  Administered 2017-07-11: 10:00:00
  Filled 2017-07-11: qty 2

## 2017-07-11 MED ORDER — FENTANYL CITRATE (PF) 100 MCG/2ML IJ SOLN
INTRAMUSCULAR | Status: AC
  Filled 2017-07-11: qty 2

## 2017-07-11 MED ORDER — MIDAZOLAM HCL 2 MG/2ML IJ SOLN
INTRAMUSCULAR | Status: DC | PRN
  Administered 2017-07-11: 2 mg via INTRAVENOUS

## 2017-07-11 MED ORDER — PHENYLEPHRINE HCL 10 MG/ML IJ SOLN
INTRAMUSCULAR | Status: DC | PRN
  Administered 2017-07-11: 50 ug via INTRAVENOUS

## 2017-07-11 NOTE — Anesthesia Postprocedure Evaluation (Signed)
Anesthesia Post Note  Patient: Meagan [name]  Procedure(s) Performed: COLONOSCOPY WITH PROPOFOL (N/A )  Patient location during evaluation: Endoscopy Anesthesia Type: General Level of consciousness: awake and alert and oriented Pain management: pain level controlled Vital Signs Assessment: post-procedure vital signs reviewed and stable Respiratory status: spontaneous breathing, nonlabored ventilation and respiratory function stable Cardiovascular status: blood pressure returned to baseline and stable Postop Assessment: no signs of nausea or vomiting Anesthetic complications: no     Last Vitals:  Vitals:   07/11/17 1052 07/11/17 1102  BP: (!) 80/57 96/64  Pulse: 75 82  Resp: 13 18  Temp:    SpO2: 100% 96%    Last Pain:  Vitals:   07/11/17 1042  TempSrc: Tympanic                 Meagan [name]

## 2017-07-11 NOTE — Anesthesia Post-op Follow-up Note (Signed)
Anesthesia QCDR form completed.        

## 2017-07-11 NOTE — Op Note (Signed)
[facility] Gastroenterology Patient Name: Meagan [name] Procedure Date: 07/11/2017 10:00 AM MRN: [id number] Account #: [id number] Date of Birth: [birth year] Admit Type: Outpatient Age: 66 Room: [facility] ENDO ROOM 3 Gender: Female Note Status: Finalized Procedure:            Colonoscopy Indications:          Screening for colorectal malignant neoplasm Providers:            Jonathon Bellows MD, MD Referring MD:         Bethena Roys. Sowles, MD (Referring MD) Medicines:            Monitored Anesthesia Care Complications:        No immediate complications. Procedure:            Pre-Anesthesia Assessment:                       - Prior to the procedure, a History and Physical was                        performed, and patient medications, allergies and                        sensitivities were reviewed. The patient's tolerance of                        previous anesthesia was reviewed.                       - The risks and benefits of the procedure and the                        sedation options and risks were discussed with the                        patient. All questions were answered and informed                        consent was obtained.                       - ASA Grade Assessment: III - A patient with severe                        systemic disease.                       After obtaining informed consent, the colonoscope was                        passed under direct vision. Throughout the procedure,                        the patient's blood pressure, pulse, and oxygen                        saturations were monitored continuously. The                        Colonoscope was introduced through the anus and  advanced to the the cecum, identified by the                        appendiceal orifice, IC valve and transillumination.                        The colonoscopy was performed with ease. The patient                        tolerated the procedure well. The  quality of the bowel                        preparation was good. Findings:      The perianal and digital rectal examinations were normal.      Non-bleeding internal hemorrhoids were found during retroflexion. The       hemorrhoids were small and Grade I (internal hemorrhoids that do not       prolapse).      A 3 mm polyp was found in the descending colon. The polyp was sessile.       The polyp was removed with a cold biopsy forceps. Resection and       retrieval were complete.      The exam was otherwise without abnormality on direct and retroflexion       views. Impression:           - Non-bleeding internal hemorrhoids.                       - One 3 mm polyp in the descending colon, removed with                        a cold biopsy forceps. Resected and retrieved.                       - The examination was otherwise normal on direct and                        retroflexion views. Recommendation:       - Discharge patient to home (with escort).                       - Resume previous diet.                       - Continue present medications.                       - Await pathology results.                       - Repeat colonoscopy in 5-10 years for surveillance                        based on pathology results. Procedure Code(s):    --- Professional ---                       [id number], Colonoscopy, flexible; with biopsy, single or                        multiple Diagnosis Code(s):    --- Professional ---  Z12.11, Encounter for screening for malignant neoplasm                        of colon                       D12.4, Benign neoplasm of descending colon                       K64.0, First degree hemorrhoids CPT copyright 2016 American Medical Association. All rights reserved. The codes documented in this report are preliminary and upon coder review may  be revised to meet current compliance requirements. Jonathon Bellows, MD Jonathon Bellows MD, MD 07/11/2017 10:39:16  AM This report has been signed electronically. Number of Addenda: 0 Note Initiated On: 07/11/2017 10:00 AM Scope Withdrawal Time: 0 hours 13 minutes 13 seconds  Total Procedure Duration: 0 hours 22 minutes 47 seconds       [facility]

## 2017-07-11 NOTE — H&P (Signed)
Meagan Bellows MD [address] [place], [place] [address] Phone: [phone] Fax : [phone]  Primary Care Physician:  Steele Sizer, MD Primary Gastroenterologist:  Dr. Jonathon [name]   Pre-Procedure History & Physical: HPI:  Meagan [name] is a 66 y.o. female is here for an colonoscopy.   Past Medical History:  Diagnosis Date  . Cellulitis    left leg  . 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 Diffuse cystic mastopathy   . Fall 2013  . Hypertension 2004  . Occlusion and stenosis of carotid artery without mention of cerebral infarction   . Sleep apnea     Past Surgical History:  Procedure Laterality Date  . APPENDECTOMY    . BIOPSY THYROID  2006   [facility]  . BREAST BIOPSY Left 10+ yrs ago   neg  . CARPAL TUNNEL RELEASE Left March 2015  . COLONOSCOPY  2011   [facility] SURGERY  March 2015  . ganglion cyst removal     . KNEE SURGERY Right 2010  . ROTATOR CUFF REPAIR  2008  . TUBAL LIGATION      Prior to Admission medications   Medication Sig Start Date End Date Taking? Authorizing Provider  aspirin 81 MG tablet Take 81 mg by mouth daily.   Yes [provider]  Dulaglutide (TRULICITY) 1.5 BP/1.0CH SOPN INJECT 1.5 MG ( ONE PEN) INTO THE SKIN EVERY WEEK 06/12/17  Yes Sowles, Drue Stager, MD  metFORMIN (GLUCOPHAGE-XR) 750 MG 24 hr tablet Take 2 tablets (1,500 mg total) by mouth daily with breakfast. 06/12/17  Yes Sowles, Drue Stager, MD  olmesartan (BENICAR) 40 MG tablet Take 40 mg by mouth daily.   Yes [provider]  rosuvastatin (CRESTOR) 10 MG tablet Take 1 tablet (10 mg total) by mouth daily. 12/11/16  Yes Sowles, Drue Stager, MD  fluticasone (FLONASE) 50 MCG/ACT nasal spray Place 2 sprays into both nostrils daily. 10/06/15   Steele Sizer, MD  levocetirizine (XYZAL) 5 MG tablet Take 5 mg by mouth as needed for allergies.    [provider]  montelukast (SINGULAIR) 10 MG tablet Take 1 tablet (10 mg total) by  mouth at bedtime. Patient not taking: Reported on 07/11/2017 02/01/16   Steele Sizer, MD  ONE [facility] ULTRA TEST test strip USE AS DIRECTED TWICE A DAY 07/10/17   Steele Sizer, MD    Allergies as of 06/24/2017 - Review Complete 06/12/2017  Allergen Reaction Noted  . Keflex [cephalexin] Rash 11/16/2012    Family History  Problem Relation Age of Onset  . Breast cancer Sister 109  . Breast cancer Maternal Aunt     Social History   Social History  . Marital status: Widowed    Spouse name: N/A  . Number of children: N/A  . Years of education: N/A   Occupational History  . Not on file.   Social History Main Topics  . Smoking status: Never Smoker  . Smokeless tobacco: Never Used  . Alcohol use No  . Drug use: No  . Sexual activity: Not on file   Other Topics Concern  . Not on file   Social History Narrative  . No narrative on file    Review of Systems: See HPI, otherwise negative ROS  Physical Exam: BP (!) 146/96   Pulse 91   Temp (!) 96.6 F (35.9 C) (Tympanic)   Resp 18   Ht 5' 4.5" (1.638 m)   Wt 208 lb (94.3 kg)  SpO2 97%   BMI 35.15 kg/m  General:   Alert,  pleasant and cooperative in NAD Head:  Normocephalic and atraumatic. Neck:  Supple; no masses or thyromegaly. Lungs:  Clear throughout to auscultation.    Heart:  Regular rate and rhythm. Abdomen:  Soft, nontender and nondistended. Normal bowel sounds, without guarding, and without rebound.   Neurologic:  Alert and  oriented x4;  grossly normal neurologically.  Impression/Plan: [name] is here for an colonoscopy to be performed for Screening colonoscopy average risk    Risks, benefits, limitations, and alternatives regarding  colonoscopy have been reviewed with the patient.  Questions have been answered.  All parties agreeable.   Meagan Bellows, MD  07/11/2017, 10:01 AM

## 2017-07-11 NOTE — Transfer of Care (Signed)
Immediate Anesthesia Transfer of Care Note  Patient: Meagan [name]  Procedure(s) Performed: COLONOSCOPY WITH PROPOFOL (N/A )  Patient Location: PACU  Anesthesia Type:General  Level of Consciousness: awake and sedated  Airway & Oxygen Therapy: Patient Spontanous Breathing and Patient connected to nasal cannula oxygen  Post-op Assessment: Report given to RN and Post -op Vital signs reviewed and stable  Post vital signs: Reviewed and stable  Last Vitals:  Vitals:   07/11/17 0915  BP: (!) 146/96  Pulse: 91  Resp: 18  Temp: (!) 35.9 C  SpO2: 97%    Last Pain:  Vitals:   07/11/17 0915  TempSrc: Tympanic         Complications: No apparent anesthesia complications

## 2017-07-11 NOTE — Anesthesia Preprocedure Evaluation (Signed)
Anesthesia Evaluation  Patient identified by MRN, date of birth, ID band Patient awake    Reviewed: Allergy & Precautions, NPO status , Patient's Chart, lab work & pertinent test results  History of Anesthesia Complications Negative for: history of anesthetic complications  Airway Mallampati: II  TM Distance: >3 FB Neck ROM: Full    Dental  (+) Missing   Pulmonary sleep apnea and Continuous Positive Airway Pressure Ventilation , neg COPD,    breath sounds clear to auscultation- rhonchi (-) wheezing      Cardiovascular hypertension, Pt. on medications (-) CAD, (-) Past MI and (-) Cardiac Stents  Rhythm:Regular Rate:Normal - Systolic murmurs and - Diastolic murmurs    Neuro/Psych negative neurological ROS  negative psych ROS   GI/Hepatic negative GI ROS, Neg liver ROS,   Endo/Other  diabetes  Renal/GU negative Renal ROS     Musculoskeletal negative musculoskeletal ROS (+)   Abdominal (+) + obese,   Peds  Hematology negative hematology ROS (+)   Anesthesia Other Findings Past Medical History: No date: Cellulitis     Comment:  left leg No date: Diabetes mellitus without complication (HCC)     Comment:  non-insulin dependent No date: Diffuse cystic mastopathy 2013: Fall 2004: Hypertension No date: Occlusion and stenosis of carotid artery without mention of  cerebral infarction No date: Sleep apnea   Reproductive/Obstetrics                             Anesthesia Physical Anesthesia Plan  ASA: III  Anesthesia Plan: General   Post-op Pain Management:    Induction: Intravenous  PONV Risk Score and Plan: 2 and Propofol infusion  Airway Management Planned: Natural Airway  Additional Equipment:   Intra-op Plan:   Post-operative Plan:   Informed Consent: I have reviewed the patients History and Physical, chart, labs and discussed the procedure including the risks, benefits and  alternatives for the proposed anesthesia with the patient or authorized representative who has indicated his/her understanding and acceptance.   Dental advisory given  Plan Discussed with: CRNA and Anesthesiologist  Anesthesia Plan Comments:         Anesthesia Quick Evaluation

## 2017-07-12 ENCOUNTER — Other Ambulatory Visit: Payer: Self-pay | Admitting: Family Medicine

## 2017-07-12 DIAGNOSIS — E1165 Type 2 diabetes mellitus with hyperglycemia: Principal | ICD-10-CM

## 2017-07-12 DIAGNOSIS — IMO0002 Reserved for concepts with insufficient information to code with codable children: Secondary | ICD-10-CM

## 2017-07-12 DIAGNOSIS — E1129 Type 2 diabetes mellitus with other diabetic kidney complication: Secondary | ICD-10-CM

## 2017-07-12 DIAGNOSIS — R809 Proteinuria, unspecified: Secondary | ICD-10-CM

## 2017-07-12 DIAGNOSIS — E1140 Type 2 diabetes mellitus with diabetic neuropathy, unspecified: Secondary | ICD-10-CM

## 2017-07-12 LAB — SURGICAL PATHOLOGY

## 2017-07-12 NOTE — Telephone Encounter (Signed)
Patient requesting refill of Trulicity and Metformin to [facility].

## 2017-07-14 ENCOUNTER — Encounter: Payer: Self-pay | Admitting: Gastroenterology

## 2017-07-15 ENCOUNTER — Encounter: Payer: Self-pay | Admitting: Gastroenterology

## 2017-07-16 ENCOUNTER — Other Ambulatory Visit: Payer: Self-pay

## 2017-08-08 ENCOUNTER — Other Ambulatory Visit: Payer: Self-pay | Admitting: Family Medicine

## 2017-08-08 NOTE — Telephone Encounter (Addendum)
Refill request for diabetic medication:  One touch ultra test strips  Last office visit pertaining to diabetes: 06/12/2017  Lab Results  Component Value Date   HGBA1C 12.0 06/12/2017    Follow up visit: 12/11/2017

## 2017-08-08 NOTE — Telephone Encounter (Signed)
Request for diabetes medication. One Touch Test Strips to [facility]  Last office visit pertaining to diabetes: 09/05/20018  Lab Results  Component Value Date   HGBA1C 12.0 06/12/2017     Next visit:  12/11/2017.

## 2017-09-06 ENCOUNTER — Other Ambulatory Visit: Payer: Self-pay | Admitting: Family Medicine

## 2017-09-06 DIAGNOSIS — E1129 Type 2 diabetes mellitus with other diabetic kidney complication: Secondary | ICD-10-CM

## 2017-09-06 DIAGNOSIS — E1140 Type 2 diabetes mellitus with diabetic neuropathy, unspecified: Secondary | ICD-10-CM

## 2017-09-06 DIAGNOSIS — R809 Proteinuria, unspecified: Secondary | ICD-10-CM

## 2017-09-06 DIAGNOSIS — IMO0002 Reserved for concepts with insufficient information to code with codable children: Secondary | ICD-10-CM

## 2017-09-06 DIAGNOSIS — E1165 Type 2 diabetes mellitus with hyperglycemia: Principal | ICD-10-CM

## 2017-09-06 NOTE — Telephone Encounter (Signed)
Please call Endocrinology and check on referral status. Thanks!

## 2017-09-18 ENCOUNTER — Other Ambulatory Visit: Payer: Self-pay | Admitting: Family Medicine

## 2017-09-18 NOTE — Telephone Encounter (Signed)
Refill request for diabetic medication:   One touch ultra test strips  Last office visit pertaining to diabetes: 06/12/2017   Follow up visit:  12/11/2017  Lab Results  Component Value Date   HGBA1C 12.0 06/12/2017

## 2017-10-01 ENCOUNTER — Other Ambulatory Visit: Payer: Self-pay | Admitting: Family Medicine

## 2017-10-01 DIAGNOSIS — IMO0002 Reserved for concepts with insufficient information to code with codable children: Secondary | ICD-10-CM

## 2017-10-01 DIAGNOSIS — E1129 Type 2 diabetes mellitus with other diabetic kidney complication: Secondary | ICD-10-CM

## 2017-10-01 DIAGNOSIS — E1140 Type 2 diabetes mellitus with diabetic neuropathy, unspecified: Secondary | ICD-10-CM

## 2017-10-01 DIAGNOSIS — R809 Proteinuria, unspecified: Secondary | ICD-10-CM

## 2017-10-01 DIAGNOSIS — E1165 Type 2 diabetes mellitus with hyperglycemia: Principal | ICD-10-CM

## 2017-10-02 NOTE — Telephone Encounter (Signed)
Please check on endocrinology referral. If referral has been approved and they have attempted to contact patient, please call patient to ensure she follows up with them to schedule. Thanks!

## 2017-11-01 ENCOUNTER — Telehealth: Payer: Self-pay | Admitting: Family Medicine

## 2017-11-01 NOTE — Telephone Encounter (Signed)
Called patient to informed them we received a letter from [facility] Endocrinology, Dr. Lucilla Lame. Patient was a no show for her new consultation appointment. Left a message for her to call them back to schedule appointment on [phone].

## 2017-12-11 ENCOUNTER — Encounter: Payer: BC Managed Care – PPO | Admitting: Family Medicine

## 2018-01-08 ENCOUNTER — Other Ambulatory Visit: Payer: Self-pay | Admitting: Family Medicine

## 2018-01-08 ENCOUNTER — Telehealth: Payer: Self-pay | Admitting: Nurse Practitioner

## 2018-01-08 DIAGNOSIS — E1140 Type 2 diabetes mellitus with diabetic neuropathy, unspecified: Secondary | ICD-10-CM

## 2018-01-08 DIAGNOSIS — E1129 Type 2 diabetes mellitus with other diabetic kidney complication: Secondary | ICD-10-CM

## 2018-01-08 DIAGNOSIS — IMO0002 Reserved for concepts with insufficient information to code with codable children: Secondary | ICD-10-CM

## 2018-01-08 DIAGNOSIS — R809 Proteinuria, unspecified: Secondary | ICD-10-CM

## 2018-01-08 DIAGNOSIS — E1165 Type 2 diabetes mellitus with hyperglycemia: Principal | ICD-10-CM

## 2018-01-08 NOTE — Telephone Encounter (Signed)
Left message for patient regarding refill and to make appointments

## 2018-01-10 ENCOUNTER — Ambulatory Visit
Admission: EM | Admit: 2018-01-10 | Discharge: 2018-01-10 | Disposition: A | Discharge status: Home / Self Care | Payer: BC Managed Care – PPO | Attending: Emergency Medicine | Admitting: Emergency Medicine

## 2018-01-10 ENCOUNTER — Other Ambulatory Visit: Payer: Self-pay

## 2018-01-10 DIAGNOSIS — R51 Headache: Secondary | ICD-10-CM

## 2018-01-10 DIAGNOSIS — S161XXA Strain of muscle, fascia and tendon at neck level, initial encounter: Secondary | ICD-10-CM

## 2018-01-10 DIAGNOSIS — R519 Headache, unspecified: Secondary | ICD-10-CM

## 2018-01-10 MED ORDER — HYDROCODONE-ACETAMINOPHEN 5-325 MG PO TABS: 1.0000 | ORAL_TABLET | Freq: Four times a day (QID) | ORAL | 0 refills | Status: DC | PRN

## 2018-01-10 MED ORDER — METHOCARBAMOL 750 MG PO TABS: 750.0000 mg | ORAL_TABLET | ORAL | 0 refills | Status: DC

## 2018-01-10 MED ORDER — IBUPROFEN 600 MG PO TABS: 600.0000 mg | ORAL_TABLET | Freq: Four times a day (QID) | ORAL | 0 refills | Status: DC | PRN

## 2018-01-10 MED ORDER — ACETAMINOPHEN 500 MG PO TABS
1000.0000 mg | ORAL_TABLET | Freq: Once | ORAL | Status: AC
  Administered 2018-01-10: 1000 mg via ORAL

## 2018-01-10 MED ORDER — KETOROLAC TROMETHAMINE 60 MG/2ML IM SOLN
30.0000 mg | Freq: Once | INTRAMUSCULAR | Status: AC
  Administered 2018-01-10: 30 mg via INTRAMUSCULAR
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
## 2018-01-10 NOTE — Discharge Instructions (Addendum)
People tend to feel worse over the next several days, but most people are back to normal in 1 week. A small number of people will have persistent pain for up to six weeks. Take the 600 mg of ibuprofen / 1 gram of tylenol 3-4 times a day. This is an extremely effective combination for pain. Do not take the norco if you are taking the tylenol, as they both have tylenol in them, and too much can hurt your liver. Do not exceed 4 grams of tylenol per day from all sources.  Take the Robaxin on a regular basis at least for the next several days.  Go to www.goodrx.com to look up your medications. This will give you a list of where you can find your prescriptions at the most affordable prices. Or ask the pharmacist what the cash price is, or if they have any other discount programs available to help make your medication more affordable. This can be less expensive than what you would pay with insurance.

## 2018-01-21 ENCOUNTER — Other Ambulatory Visit: Payer: Self-pay | Admitting: Family Medicine

## 2018-01-21 DIAGNOSIS — Z1231 Encounter for screening mammogram for malignant neoplasm of breast: Secondary | ICD-10-CM

## 2018-02-02 ENCOUNTER — Other Ambulatory Visit: Payer: Self-pay | Admitting: Family Medicine

## 2018-02-02 DIAGNOSIS — I10 Essential (primary) hypertension: Secondary | ICD-10-CM

## 2018-02-06 LAB — HM DIABETES EYE EXAM

## 2018-02-13 ENCOUNTER — Encounter: Payer: Self-pay | Admitting: Family Medicine

## 2018-02-15 ENCOUNTER — Other Ambulatory Visit: Payer: Self-pay | Admitting: Nurse Practitioner

## 2018-02-15 DIAGNOSIS — E1165 Type 2 diabetes mellitus with hyperglycemia: Principal | ICD-10-CM

## 2018-02-15 DIAGNOSIS — R809 Proteinuria, unspecified: Secondary | ICD-10-CM

## 2018-02-15 DIAGNOSIS — E1140 Type 2 diabetes mellitus with diabetic neuropathy, unspecified: Secondary | ICD-10-CM

## 2018-02-15 DIAGNOSIS — IMO0002 Reserved for concepts with insufficient information to code with codable children: Secondary | ICD-10-CM

## 2018-02-15 DIAGNOSIS — E1129 Type 2 diabetes mellitus with other diabetic kidney complication: Secondary | ICD-10-CM

## 2018-02-26 ENCOUNTER — Ambulatory Visit
Admission: RE | Admit: 2018-02-26 | Discharge: 2018-02-26 | Disposition: A | Discharge status: Home / Self Care | Payer: BC Managed Care – PPO | Source: Ambulatory Visit | Attending: Family Medicine | Admitting: Family Medicine

## 2018-02-26 DIAGNOSIS — Z1231 Encounter for screening mammogram for malignant neoplasm of breast: Secondary | ICD-10-CM | POA: Insufficient documentation

## 2018-03-26 ENCOUNTER — Encounter: Payer: Self-pay | Admitting: Family Medicine

## 2018-03-26 ENCOUNTER — Ambulatory Visit (INDEPENDENT_AMBULATORY_CARE_PROVIDER_SITE_OTHER): Payer: BC Managed Care – PPO | Admitting: Family Medicine

## 2018-03-26 VITALS — BP 146/86 | HR 96 | Temp 97.7°F | Resp 18 | Ht 65.0 in | Wt 209.1 lb

## 2018-03-26 DIAGNOSIS — I10 Essential (primary) hypertension: Secondary | ICD-10-CM | POA: Diagnosis not present

## 2018-03-26 DIAGNOSIS — Z9119 Patient's noncompliance with other medical treatment and regimen: Secondary | ICD-10-CM

## 2018-03-26 DIAGNOSIS — Z01419 Encounter for gynecological examination (general) (routine) without abnormal findings: Secondary | ICD-10-CM

## 2018-03-26 DIAGNOSIS — E1129 Type 2 diabetes mellitus with other diabetic kidney complication: Secondary | ICD-10-CM

## 2018-03-26 DIAGNOSIS — E2839 Other primary ovarian failure: Secondary | ICD-10-CM

## 2018-03-26 DIAGNOSIS — Z124 Encounter for screening for malignant neoplasm of cervix: Secondary | ICD-10-CM

## 2018-03-26 DIAGNOSIS — Z23 Encounter for immunization: Secondary | ICD-10-CM | POA: Diagnosis not present

## 2018-03-26 DIAGNOSIS — E785 Hyperlipidemia, unspecified: Secondary | ICD-10-CM | POA: Diagnosis not present

## 2018-03-26 DIAGNOSIS — R809 Proteinuria, unspecified: Secondary | ICD-10-CM

## 2018-03-26 DIAGNOSIS — E1140 Type 2 diabetes mellitus with diabetic neuropathy, unspecified: Secondary | ICD-10-CM | POA: Diagnosis not present

## 2018-03-26 DIAGNOSIS — Z91199 Patient's noncompliance with other medical treatment and regimen due to unspecified reason: Secondary | ICD-10-CM | POA: Insufficient documentation

## 2018-03-26 DIAGNOSIS — E1169 Type 2 diabetes mellitus with other specified complication: Secondary | ICD-10-CM | POA: Diagnosis not present

## 2018-03-26 DIAGNOSIS — E1165 Type 2 diabetes mellitus with hyperglycemia: Secondary | ICD-10-CM

## 2018-03-26 DIAGNOSIS — IMO0002 Reserved for concepts with insufficient information to code with codable children: Secondary | ICD-10-CM

## 2018-03-26 DIAGNOSIS — G4733 Obstructive sleep apnea (adult) (pediatric): Secondary | ICD-10-CM | POA: Diagnosis not present

## 2018-03-26 LAB — POCT GLYCOSYLATED HEMOGLOBIN (HGB A1C): Hemoglobin A1C: 10.9 % — AB (ref 4.0–5.6)

## 2018-03-26 LAB — POCT UA - MICROALBUMIN: MICROALBUMIN (UR) POC: 20 mg/L

## 2018-03-26 MED ORDER — METFORMIN HCL ER 750 MG PO TB24: 1500.0000 mg | ORAL_TABLET | Freq: Every day | ORAL | 0 refills | Status: DC

## 2018-03-26 MED ORDER — ROSUVASTATIN CALCIUM 20 MG PO TABS: 20.0000 mg | ORAL_TABLET | Freq: Every day | ORAL | 0 refills | Status: DC

## 2018-03-26 MED ORDER — INSULIN DEGLUDEC-LIRAGLUTIDE 100-3.6 UNIT-MG/ML ~~LOC~~ SOPN: 10.0000 [IU] | PEN_INJECTOR | Freq: Every day | SUBCUTANEOUS | 0 refills | Status: DC

## 2018-03-26 MED ORDER — DULAGLUTIDE 1.5 MG/0.5ML ~~LOC~~ SOAJ: 1.5000 mg | SUBCUTANEOUS | 0 refills | Status: DC

## 2018-03-26 MED ORDER — OLMESARTAN MEDOXOMIL-HCTZ 40-25 MG PO TABS: 1.0000 | ORAL_TABLET | Freq: Every day | ORAL | 0 refills | Status: DC

## 2018-03-26 NOTE — Patient Instructions (Signed)
Xultophy at 10 units and titrate up by 2 units every 3 days to a max of 50 units or when glucose in the morning is between 100-140   Preventive Care 40-64 Years, Female Preventive care refers to lifestyle choices and visits with your health care provider that can promote health and wellness. What does preventive care include?  A yearly physical exam. This is also called an annual well check.  Dental exams once or twice a year.  Routine eye exams. Ask your health care provider how often you should have your eyes checked.  Personal lifestyle choices, including: ? Daily care of your teeth and gums. ? Regular physical activity. ? Eating a healthy diet. ? Avoiding tobacco and drug use. ? Limiting alcohol use. ? Practicing safe sex. ? Taking low-dose aspirin daily starting at age 82. ? Taking vitamin and mineral supplements as recommended by your health care provider. What happens during an annual well check? The services and screenings done by your health care provider during your annual well check will depend on your age, overall health, lifestyle risk factors, and family history of disease. Counseling Your health care provider may ask you questions about your:  Alcohol use.  Tobacco use.  Drug use.  Emotional well-being.  Home and relationship well-being.  Sexual activity.  Eating habits.  Work and work Statistician.  Method of birth control.  Menstrual cycle.  Pregnancy history.  Screening You may have the following tests or measurements:  Height, weight, and BMI.  Blood pressure.  Lipid and cholesterol levels. These may be checked every 5 years, or more frequently if you are over 8 years old.  Skin check.  Lung cancer screening. You may have this screening every year starting at age 98 if you have a 30-pack-year history of smoking and currently smoke or have quit within the past 15 years.  Fecal occult blood test (FOBT) of the stool. You may have this test  every year starting at age 39.  Flexible sigmoidoscopy or colonoscopy. You may have a sigmoidoscopy every 5 years or a colonoscopy every 10 years starting at age 52.  Hepatitis C blood test.  Hepatitis B blood test.  Sexually transmitted disease (STD) testing.  Diabetes screening. This is done by checking your blood sugar (glucose) after you have not eaten for a while (fasting). You may have this done every 1-3 years.  Mammogram. This may be done every 1-2 years. Talk to your health care provider about when you should start having regular mammograms. This may depend on whether you have a family history of breast cancer.  BRCA-related cancer screening. This may be done if you have a family history of breast, ovarian, tubal, or peritoneal cancers.  Pelvic exam and Pap test. This may be done every 3 years starting at age 69. Starting at age 49, this may be done every 5 years if you have a Pap test in combination with an HPV test.  Bone density scan. This is done to screen for osteoporosis. You may have this scan if you are at high risk for osteoporosis.  Discuss your test results, treatment options, and if necessary, the need for more tests with your health care provider. Vaccines Your health care provider may recommend certain vaccines, such as:  Influenza vaccine. This is recommended every year.  Tetanus, diphtheria, and acellular pertussis (Tdap, Td) vaccine. You may need a Td booster every 10 years.  Varicella vaccine. You may need this if you have not been vaccinated.  Zoster vaccine. You may need this after age 23.  Measles, mumps, and rubella (MMR) vaccine. You may need at least one dose of MMR if you were born in 1957 or later. You may also need a second dose.  Pneumococcal 13-valent conjugate (PCV13) vaccine. You may need this if you have certain conditions and were not previously vaccinated.  Pneumococcal polysaccharide (PPSV23) vaccine. You may need one or two doses if you  smoke cigarettes or if you have certain conditions.  Meningococcal vaccine. You may need this if you have certain conditions.  Hepatitis A vaccine. You may need this if you have certain conditions or if you travel or work in places where you may be exposed to hepatitis A.  Hepatitis B vaccine. You may need this if you have certain conditions or if you travel or work in places where you may be exposed to hepatitis B.  Haemophilus influenzae type b (Hib) vaccine. You may need this if you have certain conditions.  Talk to your health care provider about which screenings and vaccines you need and how often you need them. This information is not intended to replace advice given to you by your health care provider. Make sure you discuss any questions you have with your health care provider. Document Released: 10/21/2015 Document Revised: 06/13/2016 Document Reviewed: 07/26/2015 Elsevier Interactive Patient Education  [employer].

## 2018-03-26 NOTE — Progress Notes (Signed)
Name: Meagan [name]   MRN: [id number]    DOB: 10/20/1950   Date:03/26/2018       Progress Note  Subjective  Chief Complaint  Chief Complaint  Patient presents with  . Medication Refill  . Annual Exam  . Diabetes  . Hyperlipidemia  . Hypertension  . Sleep Apnea    HPI   Patient presents for annual CPE and follow up   DMII: she lost to follow up and ran out of all her medications about 2 months ago. She states she did not see Endo because she knows that she has not been compliant with medical recommendation. She states she does not believe in taking medications daily and prefers the natural route. We had a long conversation about medications that can be cured or can be controlled. The risk of uncontrolled DM complications , she even has a sister that is currently blind from complications of DM. She states she will try this time, willing to try xultophy. Discussed risk of medication. She denies polyphagia, polydipsia or polyuria.   HTN: bp is elevated today, it has been above 140 for a while, she states she is taking medication but it is in her car. We will continue the dose for now. She denies chest pain or palpitation   Hyperlipidemia:she is has not been taking Crestor or aspirin, but advised to resume medications and recheck labs.   Obesity: weight is unchanged, she has multiple co-morbidities. Discussed importance of weight loss with patient again.   OSA: she is wearing her CPAP machine every night, she denies morning headaches but still feels tired during the day, but not daily. Unchanged.    USPSTF grade A and B recommendations  Depression:  Depression screen [facility] 2/9 03/26/2018 06/12/2017 12/04/2016 07/06/2016 12/06/2015  Decreased Interest 0 0 0 0 0  Down, Depressed, Hopeless 0 0 0 0 0  PHQ - 2 Score 0 0 0 0 0  Altered sleeping 0 - - - -  Tired, decreased energy 1 - - - -  Change in appetite 0 - - - -  Feeling bad or failure about yourself  0 - - - -  Trouble  concentrating 0 - - - -  Moving slowly or fidgety/restless 0 - - - -  Suicidal thoughts 0 - - - -  PHQ-9 Score 1 - - - -  Difficult doing work/chores Not difficult at all - - - -   Hypertension: BP Readings from Last 3 Encounters:  03/26/18 (!) 146/86  01/10/18 (!) 169/95  07/11/17 120/77   Obesity: Wt Readings from Last 3 Encounters:  03/26/18 209 lb 1.6 oz (94.8 kg)  01/10/18 208 lb (94.3 kg)  07/11/17 208 lb (94.3 kg)   BMI Readings from Last 3 Encounters:  03/26/18 34.80 kg/m  01/10/18 35.15 kg/m  07/11/17 35.15 kg/m     HIV, hep B, hep C: N/A STD testing and prevention (chl/gon/syphilis): N/A Intimate partner violence: negative screen Sexual History/Pain during Intercourse: not in the past 14 years  Menstrual History/LMP/Abnormal Bleeding: post-menopausal  Incontinence Symptoms: occasionally has stress incontinence   Advanced Care Planning: A voluntary discussion about advance care planning including the explanation and discussion of advance directives.  Discussed health care proxy and Living will, and the patient was able to identify a health care proxy as daughter/Meagan [name] Patient does have a living will at present time.   Breast cancer:  HM Mammogram  Date Value Ref Range Status  01/25/2014 normal  Final  BRCA gene screening: one sister had breast cancer ( one of eight) not other cancers in the family  Cervical cancer screening: today   Osteoporosis: discussed bone density   Fall prevention/vitamin D: discussed high calcium and vitamin D diet Lipids:  Lab Results  Component Value Date   CHOL 198 11/06/2016   CHOL 200 (H) 01/31/2016   CHOL 201 (A) 03/03/2015   Lab Results  Component Value Date   HDL 32 (L) 11/06/2016   HDL 34 (L) 01/31/2016   HDL 32 (A) 03/03/2015   Lab Results  Component Value Date   LDLCALC 139 (H) 11/06/2016   LDLCALC 141 (H) 01/31/2016   LDLCALC 131 03/03/2015   Lab Results  Component Value Date   TRIG 133 11/06/2016    TRIG 123 01/31/2016   TRIG 191 (A) 03/03/2015   Lab Results  Component Value Date   CHOLHDL 6.2 (H) 11/06/2016   CHOLHDL 5.9 (H) 01/31/2016   No results found for: LDLDIRECT  Glucose:  Glucose  Date Value Ref Range Status  01/31/2016 257 (H) 65 - 99 mg/dL Final   Glucose, Bld  Date Value Ref Range Status  11/06/2016 305 (H) 65 - 99 mg/dL Final  04/21/2009 163 (H) 70 - 99 mg/dL Final  08/13/2008 117 (H)  Final   Glucose-Capillary  Date Value Ref Range Status  07/11/2017 214 (H) 65 - 99 mg/dL Final  04/21/2009 117 (H) 70 - 99 mg/dL Final  04/21/2009 116 (H) 70 - 99 mg/dL Final     Colorectal cancer: is up to date 2018  Lung cancer:   Low Dose CT Chest recommended if Age 68-80 years, 30 pack-year currently smoking OR have quit w/in 15years. Patient does not qualify.   Aspirin: resume aspirin 81 mg  ECG: today    Patient Active Problem List   Diagnosis Date Noted  . Non compliance with medical treatment 03/26/2018  . Dyslipidemia associated with type 2 diabetes mellitus ([place]) 12/04/2016  . Uncontrolled type 2 diabetes mellitus with microalbuminuria, without long-term current use of insulin ([place]) 12/04/2016  . Seasonal allergies 02/01/2016  . OSA (obstructive sleep apnea) 10/06/2015  . Hypertensive retinopathy 10/06/2015  . Thyroid cyst 10/06/2015  . Dyslipidemia 10/06/2015  . Type 2 diabetes, uncontrolled, with neuropathy ([place]) 10/06/2015  . History of pneumonia 10/06/2015  . Chronic neck pain 10/06/2015  . Occlusion and stenosis of carotid artery without mention of cerebral infarction   . Hypertension, benign     Past Surgical History:  Procedure Laterality Date  . APPENDECTOMY  2000s  . BIOPSY THYROID  2006   [facility]  . BREAST EXCISIONAL BIOPSY Left 2000s   neg  . CARPAL TUNNEL RELEASE Left March 2015  . COLONOSCOPY  2011   DR.WOHL  . COLONOSCOPY WITH PROPOFOL N/A 07/11/2017   Procedure: COLONOSCOPY WITH PROPOFOL;  Surgeon: Jonathon Bellows, MD;  Location: [facility]  ENDOSCOPY;  Service: Gastroenterology;  Laterality: N/A;  . ganglion cyst removal     . KNEE SURGERY Left 08/2009  . KNEE SURGERY Right 04/07/2010  . ROTATOR CUFF REPAIR Right 2008  . TUBAL LIGATION      Family History  Problem Relation Age of Onset  . Breast cancer Maternal Aunt   . Hypertension Mother   . Kidney Stones Mother   . Hypertension Father   . Congestive Heart Failure Father   . Hypertension Paternal Grandfather   . Diabetes Sister   . Vision loss Sister   . Varicose Veins Sister   . Hypertension  Sister   . Breast cancer Sister 46       Double Mastectomy  . Hypertension Sister     Social History   Socioeconomic History  . Marital status: Widowed    Spouse name: Not on file  . Number of children: 4  . Years of education: Not on file  . Highest education level: Master's degree (e.g., MA, MS, MEng, MEd, MSW, MBA)  Occupational History  . Not on file  Social Needs  . Financial resource strain: Not hard at all  . Food insecurity:    Worry: Never true    Inability: Never true  . Transportation needs:    Medical: No    Non-medical: No  Tobacco Use  . Smoking status: Never Smoker  . Smokeless tobacco: Never Used  Substance and Sexual Activity  . Alcohol use: No  . Drug use: No  . Sexual activity: Not Currently    Partners: Male    Comment: Widow for 14 years  Lifestyle  . Physical activity:    Days per week: 7 days    Minutes per session: 20 min  . Stress: Not at all  Relationships  . Social connections:    Talks on phone: More than three times a week    Gets together: More than three times a week    Attends religious service: More than 4 times per year    Active member of club or organization: Yes    Attends meetings of clubs or organizations: More than 4 times per year    Relationship status: Widowed  . Intimate partner violence:    Fear of current or ex partner: No    Emotionally abused: No    Physically abused: No    Forced sexual activity:  No  Other Topics Concern  . Not on file  Social History Narrative  . Not on file     Current Outpatient Medications:  .  fluticasone (FLONASE) 50 MCG/ACT nasal spray, Place 2 sprays into both nostrils daily., Disp: 16 g, Rfl: 0 .  levocetirizine (XYZAL) 5 MG tablet, Take 5 mg by mouth as needed for allergies., Disp: , Rfl:  .  metFORMIN (GLUCOPHAGE-XR) 750 MG 24 hr tablet, Take 2 tablets (1,500 mg total) by mouth daily with breakfast., Disp: 180 tablet, Rfl: 0 .  montelukast (SINGULAIR) 10 MG tablet, Take 1 tablet (10 mg total) by mouth at bedtime., Disp: 30 tablet, Rfl: 2 .  ONE TOUCH ULTRA TEST test strip, USE AS DIRECTED TWICE A DAY, Disp: 100 each, Rfl: 1 .  aspirin 81 MG tablet, Take 81 mg by mouth daily., Disp: , Rfl:  .  Insulin Degludec-Liraglutide (XULTOPHY) 100-3.6 UNIT-MG/ML SOPN, Inject 10-50 Units into the skin daily., Disp: 15 mL, Rfl: 0 .  olmesartan-hydrochlorothiazide (BENICAR HCT) 40-25 MG tablet, Take 1 tablet by mouth daily., Disp: 90 tablet, Rfl: 0 .  rosuvastatin (CRESTOR) 20 MG tablet, Take 1 tablet (20 mg total) by mouth daily., Disp: 90 tablet, Rfl: 0  Allergies  Allergen Reactions  . Keflex [Cephalexin] Rash     ROS   Constitutional: Negative for fever or weight change.  Respiratory: Negative for cough and shortness of breath.   Cardiovascular: Negative for chest pain or palpitations.  Gastrointestinal: Negative for abdominal pain, no bowel changes.  Musculoskeletal: Negative for gait problem or joint swelling.  Skin: Negative for rash.  Neurological: Negative for dizziness or headache.  No other specific complaints in a complete review of systems (except as listed in  HPI above).   Objective  Vitals:   03/26/18 1411 03/26/18 1433  BP: (!) 146/86   Pulse: (!) 112 96  Resp: 18   Temp: 97.7 F (36.5 C)   TempSrc: Oral   SpO2: 98%   Weight: 209 lb 1.6 oz (94.8 kg)   Height: '5\' 5"'  (1.651 m)     Body mass index is 34.8 kg/m.  Physical  Exam  Constitutional: Patient appears well-developed and obese  No distress.  HENT: Head: Normocephalic and atraumatic. Ears: B TMs ok, no erythema or effusion; Nose: Nose normal. Mouth/Throat: Oropharynx is clear and moist. No oropharyngeal exudate.  Eyes: Conjunctivae and EOM are normal. Pupils are equal, round, and reactive to light. No scleral icterus.  Neck: Normal range of motion. Neck supple. No JVD present. No thyromegaly present.  Cardiovascular: Normal rate, regular rhythm and normal heart sounds.  No murmur heard. No BLE edema. Pulmonary/Chest: Effort normal and breath sounds normal. No respiratory distress. Abdominal: Soft. Bowel sounds are normal, no distension. There is no tenderness. no masses Breast: no lumps or masses, no nipple discharge or rashes FEMALE GENITALIA:  External genitalia normal  External urethra normal Vaginal vault normal without discharge or lesions Cervix normal without discharge or lesions Bimanual exam normal without masses RECTAL: not done  Musculoskeletal: Normal range of motion, no joint effusions. No gross deformities Neurological: he is alert and oriented to person, place, and time. No cranial nerve deficit. Coordination, balance, strength, speech and gait are normal.  Skin: Skin is warm and dry. No rash noted. No erythema.  Psychiatric: Patient has a normal mood and affect. behavior is normal. Judgment and thought content normal.   Recent Results (from the past 2160 hour(s))  HM DIABETES EYE EXAM     Status: Abnormal   Collection Time: 02/06/18 12:00 AM  Result Value Ref Range   HM Diabetic Eye Exam Retinopathy (A) No Retinopathy  HM DIABETES EYE EXAM     Status: Abnormal   Collection Time: 02/06/18 12:00 AM  Result Value Ref Range   HM Diabetic Eye Exam Retinopathy (A) No Retinopathy    Comment: [facility], Dr. Matilde Sprang  POCT UA - Microalbumin     Status: Abnormal   Collection Time: 03/26/18  2:05 PM  Result Value Ref Range    Microalbumin Ur, POC 20 mg/L   Creatinine, POC  mg/dL   Albumin/Creatinine Ratio, Urine, POC    POCT HgB A1C     Status: Abnormal   Collection Time: 03/26/18  2:06 PM  Result Value Ref Range   Hemoglobin A1C 10.9 (A) 4.0 - 5.6 %   HbA1c, POC (prediabetic range)  5.7 - 6.4 %   HbA1c, POC (controlled diabetic range)  0.0 - 7.0 %      PHQ2/9: Depression screen [facility] 2/9 03/26/2018 06/12/2017 12/04/2016 07/06/2016 12/06/2015  Decreased Interest 0 0 0 0 0  Down, Depressed, Hopeless 0 0 0 0 0  PHQ - 2 Score 0 0 0 0 0  Altered sleeping 0 - - - -  Tired, decreased energy 1 - - - -  Change in appetite 0 - - - -  Feeling bad or failure about yourself  0 - - - -  Trouble concentrating 0 - - - -  Moving slowly or fidgety/restless 0 - - - -  Suicidal thoughts 0 - - - -  PHQ-9 Score 1 - - - -  Difficult doing work/chores Not difficult at all - - - -  Fall Risk: Fall Risk  03/26/2018 06/12/2017 12/04/2016 07/06/2016 12/06/2015  Falls in the past year? No No No No No    Functional Status Survey: Is the patient deaf or have difficulty hearing?: No Does the patient have difficulty seeing, even when wearing glasses/contacts?: Yes Does the patient have difficulty concentrating, remembering, or making decisions?: No Does the patient have difficulty walking or climbing stairs?: No Does the patient have difficulty dressing or bathing?: No Does the patient have difficulty doing errands alone such as visiting a doctor's office or shopping?: No   Assessment & Plan  1. Well woman exam  Discussed importance of 150 minutes of physical activity weekly, eat two servings of fish weekly, eat one serving of tree nuts ( cashews, pistachios, pecans, almonds.Marland Kitchen) every other day, eat 6 servings of fruit/vegetables daily and drink plenty of water and avoid sweet beverages.  - EKG  2. Type 2 diabetes, uncontrolled, with neuropathy (HCC)  - POCT HgB A1C - POCT UA - Microalbumin - metFORMIN (GLUCOPHAGE-XR) 750 MG  24 hr tablet; Take 2 tablets (1,500 mg total) by mouth daily with breakfast.  Dispense: 180 tablet; Refill: 0 - Insulin Degludec-Liraglutide (XULTOPHY) 100-3.6 UNIT-MG/ML SOPN; Inject 10-50 Units into the skin daily.  Dispense: 15 mL; Refill: 0  3. Dyslipidemia  Start statin therapy   4. Hypertension, uncontrolled  - olmesartan-hydrochlorothiazide (BENICAR HCT) 40-25 MG tablet; Take 1 tablet by mouth daily.  Dispense: 90 tablet; Refill: 0 - EKG  5. OSA (obstructive sleep apnea)   6. Dyslipidemia associated with type 2 diabetes mellitus (HCC)  - metFORMIN (GLUCOPHAGE-XR) 750 MG 24 hr tablet; Take 2 tablets (1,500 mg total) by mouth daily with breakfast.  Dispense: 180 tablet; Refill: 0 - Insulin Degludec-Liraglutide (XULTOPHY) 100-3.6 UNIT-MG/ML SOPN; Inject 10-50 Units into the skin daily.  Dispense: 15 mL; Refill: 0  7. Uncontrolled type 2 diabetes mellitus with microalbuminuria, without long-term current use of insulin (HCC)  - metFORMIN (GLUCOPHAGE-XR) 750 MG 24 hr tablet; Take 2 tablets (1,500 mg total) by mouth daily with breakfast.  Dispense: 180 tablet; Refill: 0 - Insulin Degludec-Liraglutide (XULTOPHY) 100-3.6 UNIT-MG/ML SOPN; Inject 10-50 Units into the skin daily.  Dispense: 15 mL; Refill: 0  8. Need for Tdap vaccination  - Tdap vaccine greater than or equal to 7yo IM  9. Cervical cancer screening  - Pap IG and HPV (high risk) DNA detection  10. Non compliance with medical treatment  She states did not see Endo because not compliant with diet and medication, explained that I will give her 6 months to get glucose close to goal otherwise I will no longer be able to take care of her  11. Morbid obesity ([place])  Discussed with the patient the risk posed by an increased BMI. Discussed importance of portion control, calorie counting and at least 150 minutes of physical activity weekly. Avoid sweet beverages and drink more water. Eat at least 6 servings of fruit and  vegetables daily

## 2018-03-27 LAB — COMPLETE METABOLIC PANEL WITH GFR
AG RATIO: 1.3 (calc) (ref 1.0–2.5)
ALKALINE PHOSPHATASE (APISO): 134 U/L — AB (ref 33–130)
ALT: 14 U/L (ref 6–29)
AST: 13 U/L (ref 10–35)
Albumin: 3.8 g/dL (ref 3.6–5.1)
BILIRUBIN TOTAL: 0.3 mg/dL (ref 0.2–1.2)
BUN: 16 mg/dL (ref 7–25)
CHLORIDE: 98 mmol/L (ref 98–110)
CO2: 31 mmol/L (ref 20–32)
CREATININE: 0.84 mg/dL (ref 0.50–0.99)
Calcium: 9.7 mg/dL (ref 8.6–10.4)
GFR, Est African American: 85 mL/min/{1.73_m2} (ref >=60)
GFR, Est Non African American: 73 mL/min/{1.73_m2} (ref >=60)
GLOBULIN: 3 g/dL (ref 1.9–3.7)
Glucose, Bld: 295 mg/dL — ABNORMAL HIGH (ref 65–139)
POTASSIUM: 3.6 mmol/L (ref 3.5–5.3)
SODIUM: 137 mmol/L (ref 135–146)
Total Protein: 6.8 g/dL (ref 6.1–8.1)

## 2018-03-27 LAB — LIPID PANEL
CHOL/HDL RATIO: 6.1 (calc) — AB (ref <5.0)
CHOLESTEROL: 196 mg/dL (ref <200)
HDL: 32 mg/dL — ABNORMAL LOW (ref >50)
LDL Cholesterol (Calc): 132 mg/dL (calc) — ABNORMAL HIGH
Non-HDL Cholesterol (Calc): 164 mg/dL (calc) — ABNORMAL HIGH (ref <130)
Triglycerides: 187 mg/dL — ABNORMAL HIGH (ref <150)

## 2018-03-27 LAB — CBC WITH DIFFERENTIAL/PLATELET
BASOS ABS: 39 {cells}/uL (ref 0–200)
BASOS PCT: 0.4 %
EOS ABS: 126 {cells}/uL (ref 15–500)
Eosinophils Relative: 1.3 %
HCT: 41.7 % (ref 35.0–45.0)
Hemoglobin: 14.5 g/dL (ref 11.7–15.5)
Lymphs Abs: 2280 cells/uL (ref 850–3900)
MCH: 29.9 pg (ref 27.0–33.0)
MCHC: 34.8 g/dL (ref 32.0–36.0)
MCV: 86 fL (ref 80.0–100.0)
MONOS PCT: 7.1 %
MPV: 10.7 fL (ref 7.5–12.5)
Neutro Abs: 6567 cells/uL (ref 1500–7800)
Neutrophils Relative %: 67.7 %
PLATELETS: 305 10*3/uL (ref 140–400)
RBC: 4.85 10*6/uL (ref 3.80–5.10)
RDW: 12.2 % (ref 11.0–15.0)
TOTAL LYMPHOCYTE: 23.5 %
WBC mixed population: 689 cells/uL (ref 200–950)
WBC: 9.7 10*3/uL (ref 3.8–10.8)

## 2018-03-31 LAB — PAP IG AND HPV HIGH-RISK: HPV DNA High Risk: NOT DETECTED

## 2018-06-27 ENCOUNTER — Other Ambulatory Visit: Payer: Self-pay | Admitting: Family Medicine

## 2018-06-27 DIAGNOSIS — IMO0002 Reserved for concepts with insufficient information to code with codable children: Secondary | ICD-10-CM

## 2018-06-27 DIAGNOSIS — E1165 Type 2 diabetes mellitus with hyperglycemia: Principal | ICD-10-CM

## 2018-06-27 DIAGNOSIS — E785 Hyperlipidemia, unspecified: Secondary | ICD-10-CM

## 2018-06-27 DIAGNOSIS — E1129 Type 2 diabetes mellitus with other diabetic kidney complication: Secondary | ICD-10-CM

## 2018-06-27 DIAGNOSIS — R809 Proteinuria, unspecified: Secondary | ICD-10-CM

## 2018-06-27 DIAGNOSIS — E1169 Type 2 diabetes mellitus with other specified complication: Secondary | ICD-10-CM

## 2018-06-27 DIAGNOSIS — E1140 Type 2 diabetes mellitus with diabetic neuropathy, unspecified: Secondary | ICD-10-CM

## 2018-06-28 NOTE — Telephone Encounter (Signed)
Please verify if she has enough until follow up

## 2018-07-04 ENCOUNTER — Ambulatory Visit: Payer: BC Managed Care – PPO | Admitting: Family Medicine

## 2018-07-09 NOTE — Telephone Encounter (Signed)
Left a message to inquire if patient had enough medication until her appt on November 04. If not to call us back and let us know.

## 2018-08-11 ENCOUNTER — Ambulatory Visit: Payer: BC Managed Care – PPO | Admitting: Family Medicine

## 2018-08-16 ENCOUNTER — Other Ambulatory Visit: Payer: Self-pay | Admitting: Family Medicine

## 2018-08-26 ENCOUNTER — Ambulatory Visit: Payer: BC Managed Care – PPO | Admitting: Family Medicine

## 2018-10-22 ENCOUNTER — Ambulatory Visit: Payer: BC Managed Care – PPO | Admitting: Family Medicine

## 2018-10-22 ENCOUNTER — Encounter: Payer: Self-pay | Admitting: Family Medicine

## 2018-10-22 VITALS — BP 166/98 | HR 99 | Temp 98.2°F | Resp 16 | Ht 65.0 in | Wt 206.6 lb

## 2018-10-22 DIAGNOSIS — J302 Other seasonal allergic rhinitis: Secondary | ICD-10-CM

## 2018-10-22 DIAGNOSIS — E1140 Type 2 diabetes mellitus with diabetic neuropathy, unspecified: Secondary | ICD-10-CM

## 2018-10-22 DIAGNOSIS — Z23 Encounter for immunization: Secondary | ICD-10-CM | POA: Diagnosis not present

## 2018-10-22 DIAGNOSIS — E1165 Type 2 diabetes mellitus with hyperglycemia: Secondary | ICD-10-CM

## 2018-10-22 DIAGNOSIS — E1169 Type 2 diabetes mellitus with other specified complication: Secondary | ICD-10-CM

## 2018-10-22 DIAGNOSIS — E785 Hyperlipidemia, unspecified: Secondary | ICD-10-CM

## 2018-10-22 DIAGNOSIS — I10 Essential (primary) hypertension: Secondary | ICD-10-CM | POA: Diagnosis not present

## 2018-10-22 DIAGNOSIS — IMO0002 Reserved for concepts with insufficient information to code with codable children: Secondary | ICD-10-CM

## 2018-10-22 LAB — POCT GLYCOSYLATED HEMOGLOBIN (HGB A1C): HEMOGLOBIN A1C: 14 % — AB (ref 4.0–5.6)

## 2018-10-22 LAB — POCT GLUCOSE (DEVICE FOR HOME USE): Glucose Fasting, POC: 322 mg/dL — AB (ref 70–99)

## 2018-10-22 MED ORDER — INSULIN DEGLUDEC 200 UNIT/ML ~~LOC~~ SOPN: 16.0000 [IU] | PEN_INJECTOR | Freq: Every day | SUBCUTANEOUS | 0 refills | Status: DC

## 2018-10-22 MED ORDER — SEMAGLUTIDE(0.25 OR 0.5MG/DOS) 2 MG/1.5ML ~~LOC~~ SOPN: 0.5000 mg | PEN_INJECTOR | SUBCUTANEOUS | 2 refills | Status: DC

## 2018-10-22 MED ORDER — METFORMIN HCL ER 750 MG PO TB24: 1500.0000 mg | ORAL_TABLET | Freq: Every day | ORAL | 0 refills | Status: DC

## 2018-10-22 MED ORDER — OLMESARTAN MEDOXOMIL-HCTZ 40-25 MG PO TABS: 1.0000 | ORAL_TABLET | Freq: Every day | ORAL | 0 refills | Status: DC

## 2018-10-22 MED ORDER — FLUTICASONE PROPIONATE 50 MCG/ACT NA SUSP: 2.0000 | Freq: Every day | NASAL | 1 refills | Status: DC

## 2018-10-22 MED ORDER — INSULIN PEN NEEDLE 32G X 6 MM MISC: 1.0000 | Freq: Every day | 0 refills | Status: DC

## 2018-10-22 MED ORDER — ROSUVASTATIN CALCIUM 20 MG PO TABS: 20.0000 mg | ORAL_TABLET | Freq: Every day | ORAL | 0 refills | Status: DC

## 2018-10-22 MED ORDER — MONTELUKAST SODIUM 10 MG PO TABS: 10.0000 mg | ORAL_TABLET | Freq: Every day | ORAL | 2 refills | Status: DC

## 2018-10-22 MED ORDER — GLUCOSE BLOOD VI STRP: ORAL_STRIP | 1 refills | Status: DC

## 2018-10-22 NOTE — Patient Instructions (Signed)
Take Tyler Aas starting at 16 units and go up by 2 units every 2 days to get fasting sugar between 100-140

## 2018-10-22 NOTE — Progress Notes (Signed)
Name: Meagan [name]   MRN: [id number]    DOB: May 29, 1951   Date:10/22/2018       Progress Note  Subjective  Chief Complaint  Chief Complaint  Patient presents with  . Medication Refill    Has been without medication for 2 months  . Diabetes  . Hypertension    Extreme headaches but with out medications  . Hyperlipidemia    HPI  DMII: her last visit was June 2019, she has been out of medication for the past 2 months. Her  HgbA1C went from 10.7, 12 today and today is 14 %  She has microalbuminuria and has been out of ARB for months.  She also has a history of  neuropathy but stopped Gabapentin because symptoms improved.She states eye exam up to date.  She denies polyphagia, polydipsia or polyuria or nocturia. Discussed again importance of compliance with medication and life style modification to decrease her risk of heart attack, strokes, amputation, kidney failure or blindness. I made referral to endocrinologist last year but she did not keep appointment . Sent rx of Xultophy on her last visit but she did not get it because of cost. We will try [place] and Ozempic now Resume Metformin   HTN: bp out of control but she has been out of her medication. She denies chest pain or palpitation   Hyperlipidemia:she has been off her medication for the past 2 months, we will resume medication and recheck next visit   Obesity: weight is stable, she states eats a lot of chicken, states eating salads and soups, avoiding eating fast food, but when she does she is eating chili and salad, she continues to drink sodas 2 cans daily  OSA: she is wearing her CPAP machine every night, she denies morning headaches but still feels tired during the day. Unchanged    Patient Active Problem List   Diagnosis Date Noted  . Morbid obesity ([place]) 10/22/2018  . Non compliance with medical treatment 03/26/2018  . Dyslipidemia associated with type 2 diabetes mellitus ([place]) 12/04/2016  . Uncontrolled type 2 diabetes  mellitus with microalbuminuria, without long-term current use of insulin ([place]) 12/04/2016  . Seasonal allergies 02/01/2016  . OSA (obstructive sleep apnea) 10/06/2015  . Hypertensive retinopathy 10/06/2015  . Thyroid cyst 10/06/2015  . Dyslipidemia 10/06/2015  . Type 2 diabetes, uncontrolled, with neuropathy ([place]) 10/06/2015  . History of pneumonia 10/06/2015  . Chronic neck pain 10/06/2015  . Occlusion and stenosis of carotid artery without mention of cerebral infarction   . Hypertension, benign     Past Surgical History:  Procedure Laterality Date  . APPENDECTOMY  2000s  . BIOPSY THYROID  2006   [facility]  . BREAST EXCISIONAL BIOPSY Left 2000s   neg  . CARPAL TUNNEL RELEASE Left March 2015  . COLONOSCOPY  2011   DR.WOHL  . COLONOSCOPY WITH PROPOFOL N/A 07/11/2017   Procedure: COLONOSCOPY WITH PROPOFOL;  Surgeon: Jonathon Bellows, MD;  Location: [facility] ENDOSCOPY;  Service: Gastroenterology;  Laterality: N/A;  . ganglion cyst removal     . KNEE SURGERY Left 08/2009  . KNEE SURGERY Right 04/07/2010  . ROTATOR CUFF REPAIR Right 2008  . TUBAL LIGATION      Family History  Problem Relation Age of Onset  . Breast cancer Maternal Aunt   . Hypertension Mother   . Kidney Stones Mother   . Hypertension Father   . Congestive Heart Failure Father   . Hypertension Paternal Grandfather   . Diabetes Sister   .  Vision loss Sister   . Varicose Veins Sister   . Hypertension Sister   . Breast cancer Sister 22       Double Mastectomy  . Hypertension Sister     Social History   Socioeconomic History  . Marital status: Widowed    Spouse name: Not on file  . Number of children: 4  . Years of education: Not on file  . Highest education level: Master's degree (e.g., MA, MS, MEng, MEd, MSW, MBA)  Occupational History  . Not on file  Social Needs  . Financial resource strain: Not hard at all  . Food insecurity:    Worry: Never true    Inability: Never true  . Transportation needs:     Medical: No    Non-medical: No  Tobacco Use  . Smoking status: Never Smoker  . Smokeless tobacco: Never Used  Substance and Sexual Activity  . Alcohol use: No  . Drug use: No  . Sexual activity: Not Currently    Partners: Male    Comment: Widow for 14 years  Lifestyle  . Physical activity:    Days per week: 7 days    Minutes per session: 20 min  . Stress: Not at all  Relationships  . Social connections:    Talks on phone: More than three times a week    Gets together: More than three times a week    Attends religious service: More than 4 times per year    Active member of club or organization: Yes    Attends meetings of clubs or organizations: More than 4 times per year    Relationship status: Widowed  . Intimate partner violence:    Fear of current or ex partner: No    Emotionally abused: No    Physically abused: No    Forced sexual activity: No  Other Topics Concern  . Not on file  Social History Narrative  . Not on file     Current Outpatient Medications:  .  aspirin 81 MG tablet, Take 81 mg by mouth daily., Disp: , Rfl:  .  fluticasone (FLONASE) 50 MCG/ACT nasal spray, Place 2 sprays into both nostrils daily., Disp: 48 g, Rfl: 1 .  glucose blood (ONE TOUCH ULTRA TEST) test strip, USE AS DIRECTED TWICE A DAY, Disp: 100 each, Rfl: 1 .  Insulin Degludec (TRESIBA FLEXTOUCH) 200 UNIT/ML SOPN, Inject 16-50 Units into the skin daily., Disp: 9 mL, Rfl: 0 .  Insulin Pen Needle 32G X 6 MM MISC, 1 each by Does not apply route daily., Disp: 100 each, Rfl: 0 .  levocetirizine (XYZAL) 5 MG tablet, Take 5 mg by mouth as needed for allergies., Disp: , Rfl:  .  metFORMIN (GLUCOPHAGE-XR) 750 MG 24 hr tablet, Take 2 tablets (1,500 mg total) by mouth daily with breakfast., Disp: 180 tablet, Rfl: 0 .  montelukast (SINGULAIR) 10 MG tablet, Take 1 tablet (10 mg total) by mouth at bedtime., Disp: 30 tablet, Rfl: 2 .  olmesartan-hydrochlorothiazide (BENICAR HCT) 40-25 MG tablet, Take 1 tablet  by mouth daily., Disp: 90 tablet, Rfl: 0 .  rosuvastatin (CRESTOR) 20 MG tablet, Take 1 tablet (20 mg total) by mouth daily., Disp: 90 tablet, Rfl: 0 .  Semaglutide,0.25 or 0.5MG /DOS, (OZEMPIC, 0.25 OR 0.5 MG/DOSE,) 2 MG/1.5ML SOPN, Inject 0.5 mg into the skin once a week., Disp: 2 pen, Rfl: 2  Allergies  Allergen Reactions  . Keflex [Cephalexin] Rash    I personally reviewed active problem list, medication  list, allergies, family history, social history with the patient/caregiver today.   ROS  Constitutional: Negative for fever or  weight change.  Respiratory: Negative for cough and shortness of breath.   Cardiovascular: Negative for chest pain or palpitations.  Gastrointestinal: Negative for abdominal pain, no bowel changes.  Musculoskeletal: Negative for gait problem or joint swelling.  Skin: Negative for rash.  Neurological: Negative for dizziness or headache.  No other specific complaints in a complete review of systems (except as listed in HPI above).  Objective  Vitals:   10/22/18 1154  BP: (!) 208/110  Pulse: 99  Resp: 16  Temp: 98.2 F (36.8 C)  TempSrc: Oral  SpO2: 93%  Weight: 206 lb 9.6 oz (93.7 kg)  Height: 5\' 5"  (1.651 m)    Body mass index is 34.38 kg/m.  Physical Exam  Constitutional: Patient appears well-developed and well-nourished. Obese No distress.  HEENT: head atraumatic, normocephalic, pupils equal and reactive to light, ears neck supple, throat within normal limits Cardiovascular: Normal rate, regular rhythm and normal heart sounds.  No murmur heard. Trace  BLE edema. Pulmonary/Chest: Effort normal and breath sounds normal. No respiratory distress. Abdominal: Soft.  There is no tenderness. Psychiatric: Patient has a normal mood and affect. behavior is normal. Judgment and thought content normal.  Recent Results (from the past 2160 hour(s))  POCT HgB A1C     Status: Abnormal   Collection Time: 10/22/18 12:04 PM  Result Value Ref Range    Hemoglobin A1C 14.0 (A) 4.0 - 5.6 %    Comment: Unable to read. > 14.0%   HbA1c POC (<> result, manual entry)     HbA1c, POC (prediabetic range)     HbA1c, POC (controlled diabetic range)    POCT Glucose (Device for Home Use)     Status: Abnormal   Collection Time: 10/22/18 12:06 PM  Result Value Ref Range   Glucose Fasting, POC 322 (A) 70 - 99 mg/dL   POC Glucose      Diabetic Foot Exam: Diabetic Foot Exam - Simple   Simple Foot Form Diabetic Foot exam was performed with the following findings:  Yes 10/22/2018 12:27 PM  Visual Inspection No deformities, no ulcerations, no other skin breakdown bilaterally:  Yes Sensation Testing Intact to touch and monofilament testing bilaterally:  Yes Pulse Check Posterior Tibialis and Dorsalis pulse intact bilaterally:  Yes Comments      PHQ2/9: Depression screen [facility] 2/9 03/26/2018 06/12/2017 12/04/2016 07/06/2016 12/06/2015  Decreased Interest 0 0 0 0 0  Down, Depressed, Hopeless 0 0 0 0 0  PHQ - 2 Score 0 0 0 0 0  Altered sleeping 0 - - - -  Tired, decreased energy 1 - - - -  Change in appetite 0 - - - -  Feeling bad or failure about yourself  0 - - - -  Trouble concentrating 0 - - - -  Moving slowly or fidgety/restless 0 - - - -  Suicidal thoughts 0 - - - -  PHQ-9 Score 1 - - - -  Difficult doing work/chores Not difficult at all - - - -     Fall Risk: Fall Risk  10/22/2018 03/26/2018 06/12/2017 12/04/2016 07/06/2016  Falls in the past year? 0 No No No No      Functional Status Survey: Is the patient deaf or have difficulty hearing?: No Does the patient have difficulty seeing, even when wearing glasses/contacts?: Yes Does the patient have difficulty concentrating, remembering, or making decisions?: No Does the  patient have difficulty walking or climbing stairs?: No Does the patient have difficulty dressing or bathing?: No Does the patient have difficulty doing errands alone such as visiting a doctor's office or shopping?:  No   Assessment & Plan  1. Type 2 diabetes, uncontrolled, with neuropathy ([place])  Discussed importance of regular follow ups, compliance with diet and also medication  - POCT HgB A1C - POCT Glucose (Device for Home Use)  2. Flu vaccine need  Refused   3. Hypertension, uncontrolled  - olmesartan-hydrochlorothiazide (BENICAR HCT) 40-25 MG tablet; Take 1 tablet by mouth daily.  Dispense: 90 tablet; Refill: 0  4. Dyslipidemia  Resume Crestor   5. Morbid obesity ([place])  Discussed with the patient the risk posed by an increased BMI. Discussed importance of portion control, calorie counting and at least 150 minutes of physical activity weekly. Avoid sweet beverages and drink more water. Eat at least 6 servings of fruit and vegetables daily   6. Need for pneumococcal vaccine  - Pneumococcal conjugate vaccine 13-valent IM  7. Seasonal allergies  - montelukast (SINGULAIR) 10 MG tablet; Take 1 tablet (10 mg total) by mouth at bedtime.  Dispense: 30 tablet; Refill: 2 - fluticasone (FLONASE) 50 MCG/ACT nasal spray; Place 2 sprays into both nostrils daily.  Dispense: 48 g; Refill: 1  8. Dyslipidemia associated with type 2 diabetes mellitus (HCC)  - rosuvastatin (CRESTOR) 20 MG tablet; Take 1 tablet (20 mg total) by mouth daily.  Dispense: 90 tablet; Refill: 0 - glucose blood (ONE TOUCH ULTRA TEST) test strip; USE AS DIRECTED TWICE A DAY  Dispense: 100 each; Refill: 1 - metFORMIN (GLUCOPHAGE-XR) 750 MG 24 hr tablet; Take 2 tablets (1,500 mg total) by mouth daily with breakfast.  Dispense: 180 tablet; Refill: 0 - Semaglutide,0.25 or 0.5MG /DOS, (OZEMPIC, 0.25 OR 0.5 MG/DOSE,) 2 MG/1.5ML SOPN; Inject 0.5 mg into the skin once a week.  Dispense: 2 pen; Refill: 2 - Insulin Degludec (TRESIBA FLEXTOUCH) 200 UNIT/ML SOPN; Inject 16-50 Units into the skin daily.  Dispense: 9 mL; Refill: 0 - Insulin Pen Needle 32G X 6 MM MISC; 1 each by Does not apply route daily.  Dispense: 100 each; Refill: 0

## 2018-10-24 ENCOUNTER — Ambulatory Visit: Payer: Self-pay

## 2018-10-24 DIAGNOSIS — IMO0002 Reserved for concepts with insufficient information to code with codable children: Secondary | ICD-10-CM

## 2018-10-24 DIAGNOSIS — E785 Hyperlipidemia, unspecified: Secondary | ICD-10-CM

## 2018-10-24 DIAGNOSIS — E1165 Type 2 diabetes mellitus with hyperglycemia: Principal | ICD-10-CM

## 2018-10-24 DIAGNOSIS — E1140 Type 2 diabetes mellitus with diabetic neuropathy, unspecified: Secondary | ICD-10-CM

## 2018-10-24 NOTE — Chronic Care Management (AMB) (Signed)
  Care Management   Note  10/24/2018 Name: YEIMI DEBNAM MRN: [id number] DOB: [birth year]  [name]. Huq is a 68 year old female who sees Dr. Steele Sizer for primary care. Dr. Ancil Boozer asked the CCM team to consult the patient for assistance with chronic disease management related to her uncontrolled DM and diabetic medication titration. Referral was placed 10/22/2018. Telephone outreach to patient today to introduce CCM services.   Was unable to reach patient via telephone. Unfortunately voice mailbox has not been set up on patient's given number (unsuccessful outreach #1).  Plan: Will follow-up within 3-5  business days via telephone.     Stella Bortle E. Rollene Rotunda, RN, BSN Nurse Care Coordinator [facility] / [facility] Care Management  [phone]

## 2018-10-27 ENCOUNTER — Ambulatory Visit: Payer: Self-pay

## 2018-10-27 DIAGNOSIS — I10 Essential (primary) hypertension: Secondary | ICD-10-CM

## 2018-10-27 DIAGNOSIS — E785 Hyperlipidemia, unspecified: Secondary | ICD-10-CM

## 2018-10-27 DIAGNOSIS — IMO0002 Reserved for concepts with insufficient information to code with codable children: Secondary | ICD-10-CM

## 2018-10-27 DIAGNOSIS — E1165 Type 2 diabetes mellitus with hyperglycemia: Principal | ICD-10-CM

## 2018-10-27 DIAGNOSIS — E1140 Type 2 diabetes mellitus with diabetic neuropathy, unspecified: Secondary | ICD-10-CM

## 2018-10-27 NOTE — Chronic Care Management (AMB) (Signed)
  Care Management   Note  10/27/2018 Name: Meagan [name] MRN: [id number] DOB: 10-25-1950    [name] is a 68 year old female who sees Dr. Laure Kidney primary care. Dr. Ancil Boozer asked the CCM team to consult the patient for assistance with chronic disease management related toher uncontrolled DM and diabetic medication titration. Referral was placed1/15/2020. Telephone outreach to patient today to introduce CCM services.  Was unable to reach patient via telephone. Unfortunately voice mailbox has not been set up on patient's given number (unsuccessful outreach #2).  Plan: Will follow-up within 3-5  business days via telephone.    Dailan Pfalzgraf E. Rollene Rotunda, RN, BSN Nurse Care Coordinator [facility] / [facility] Care Management  [phone]

## 2018-10-28 ENCOUNTER — Encounter: Payer: Self-pay | Admitting: Nurse Practitioner

## 2018-10-28 ENCOUNTER — Telehealth: Payer: Self-pay

## 2018-10-28 ENCOUNTER — Ambulatory Visit: Payer: BC Managed Care – PPO | Admitting: Nurse Practitioner

## 2018-10-28 VITALS — BP 180/110 | HR 93 | Temp 97.3°F | Resp 14 | Ht 65.0 in | Wt 202.9 lb

## 2018-10-28 DIAGNOSIS — T50Z95A Adverse effect of other vaccines and biological substances, initial encounter: Secondary | ICD-10-CM

## 2018-10-28 DIAGNOSIS — I10 Essential (primary) hypertension: Secondary | ICD-10-CM | POA: Diagnosis not present

## 2018-10-28 MED ORDER — DIPHENHYDRAMINE HCL 12.5 MG/5ML PO ELIX: 25.0000 mg | ORAL_SOLUTION | Freq: Every day | ORAL | 0 refills | Status: DC

## 2018-10-28 MED ORDER — FAMOTIDINE 20 MG PO TABS: 20.0000 mg | ORAL_TABLET | Freq: Every day | ORAL | 0 refills | Status: DC

## 2018-10-28 MED ORDER — DIPHENHYDRAMINE HCL 50 MG/ML IJ SOLN
25.0000 mg | Freq: Once | INTRAMUSCULAR | Status: AC
  Administered 2018-10-28: 25 mg via INTRAMUSCULAR

## 2018-10-28 NOTE — Addendum Note (Signed)
Addended by: Fredderick Severance on: 10/28/2018 05:20 PM   Modules accepted: Orders

## 2018-10-28 NOTE — Patient Instructions (Addendum)
FOR YOUR ARM: Please take famotidine 20mg  starting tonight with benadryl 25mg  starting tomorrow night for the next 3 days. If at any point your redness increases past the line please let us know so I can send in an antibiotic. Follow up in one week if not resolved.   Please call in your blood pressure reading tomorrow at school to let us know if it has improved.  Your goal blood pressure is less than 140 mmHg on top. Try to follow the DASH guidelines (DASH stands for Dietary Approaches to Stop Hypertension) Try to limit the sodium in your diet.  Ideally, consume less than 1.5 grams (less than 1,500mg ) per day. Do not add salt when cooking or at the table.  Check the sodium amount on labels when shopping, and choose items lower in sodium when given a choice. Avoid or limit foods that already contain a lot of sodium. Eat a diet rich in fruits and vegetables and whole grains.   Please call chronic care management team to get further assist managing blood pressure and diabetes   Portia E. Rollene Rotunda, RN, BSN Nurse Care Coordinator [facility] / [facility] Care Management  [phone]

## 2018-10-28 NOTE — Telephone Encounter (Signed)
Copied from [place]. Topic: General - Other >> Oct 28, 2018  2:01 PM Carolyn Stare wrote:  Pt said she had a pneumonia shot last week and now has a rash and would like to know if this is a side effect

## 2018-10-28 NOTE — Progress Notes (Signed)
Name: Meagan [name]   MRN: [id number]    DOB: 06-05-1951   Date:10/28/2018       Progress Note  Subjective  Chief Complaint  Chief Complaint  Patient presents with  . Allergic Reaction    pcv 13    HPI  Patient got pneumonia vaccines last week noted normal arm pain after the shot- knot appeared on arm and redness started to spread. Continuing to spread on arm. Tried warm compress without relief.   No fevers, chills, shortness breath, nausea, vomiting.  Blood pressure elevated; takes olmesatran-hctz 40-25mg  every day with no missed doses this past week; states rushed to make appointment. Denies headaches, chest pain. States it is up because she is hard headed and does not like to take her medicine but is planning on doing better.  BP Readings from Last 3 Encounters:  10/28/18 (!) 160/68  10/22/18 (!) 166/98  03/26/18 (!) 146/86     Patient Active Problem List   Diagnosis Date Noted  . Morbid obesity ([place]) 10/22/2018  . Non compliance with medical treatment 03/26/2018  . Dyslipidemia associated with type 2 diabetes mellitus ([place]) 12/04/2016  . Uncontrolled type 2 diabetes mellitus with microalbuminuria, without long-term current use of insulin ([place]) 12/04/2016  . Seasonal allergies 02/01/2016  . OSA (obstructive sleep apnea) 10/06/2015  . Hypertensive retinopathy 10/06/2015  . Thyroid cyst 10/06/2015  . Dyslipidemia 10/06/2015  . Type 2 diabetes, uncontrolled, with neuropathy ([place]) 10/06/2015  . History of pneumonia 10/06/2015  . Chronic neck pain 10/06/2015  . Occlusion and stenosis of carotid artery without mention of cerebral infarction   . Hypertension, benign     Past Medical History:  Diagnosis Date  . Cellulitis    left leg  . 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 Diffuse cystic mastopathy   . Fall 2013  . Hypertension 2004  . Occlusion and stenosis of carotid artery without mention of cerebral infarction   . Sleep apnea      Past Surgical History:  Procedure Laterality Date  . APPENDECTOMY  2000s  . BIOPSY THYROID  2006   [facility]  . BREAST EXCISIONAL BIOPSY Left 2000s   neg  . CARPAL TUNNEL RELEASE Left March 2015  . COLONOSCOPY  2011   DR[facility]WOHL  . COLONOSCOPY WITH PROPOFOL N/A 07/11/2017   Procedure: COLONOSCOPY WITH PROPOFOL;  Surgeon: Jonathon Bellows, MD;  Location[facility] ENDOSCOPY;  Service: Gastroenterology;  Laterality: N/A;  . ganglion cyst removal     . KNEE SURGERY Left 08/2009  . KNEE SURGERY Right 04/07/2010  . ROTATOR CUFF REPAIR Right 2008  . TUBAL LIGATION      Social History   Tobacco Use  . Smoking status: Never Smoker  . Smokeless tobacco: Never Used  Substance Use Topics  . Alcohol use: No     Current Outpatient Medications:  .  aspirin 81 MG tablet, Take 81 mg by mouth daily., Disp: , Rfl:  .  fluticasone (FLONASE) 50 MCG/ACT nasal spray, Place 2 sprays into both nostrils daily., Disp: 48 g, Rfl: 1 .  glucose blood (ONE TOUCH ULTRA TEST) test strip, USE AS DIRECTED TWICE A DAY, Disp: 100 each, Rfl: 1 .  Insulin Degludec (TRESIBA FLEXTOUCH) 200 UNIT/ML SOPN, Inject 16-50 Units into the skin daily., Disp: 9 mL, Rfl: 0 .  Insulin Pen Needle 32G X 6 MM MISC, 1 each by Does not apply route daily., Disp: 100 each, Rfl: 0 .  levocetirizine (XYZAL) 5 MG tablet,  Take 5 mg by mouth as needed for allergies., Disp: , Rfl:  .  metFORMIN (GLUCOPHAGE-XR) 750 MG 24 hr tablet, Take 2 tablets (1,500 mg total) by mouth daily with breakfast., Disp: 180 tablet, Rfl: 0 .  montelukast (SINGULAIR) 10 MG tablet, Take 1 tablet (10 mg total) by mouth at bedtime., Disp: 30 tablet, Rfl: 2 .  olmesartan-hydrochlorothiazide (BENICAR HCT) 40-25 MG tablet, Take 1 tablet by mouth daily., Disp: 90 tablet, Rfl: 0 .  rosuvastatin (CRESTOR) 20 MG tablet, Take 1 tablet (20 mg total) by mouth daily., Disp: 90 tablet, Rfl: 0 .  Semaglutide,0.25 or 0.5MG /DOS, (OZEMPIC, 0.25 OR 0.5 MG/DOSE,) 2 MG/1.5ML SOPN, Inject 0.5 mg  into the skin once a week., Disp: 2 pen, Rfl: 2  Allergies  Allergen Reactions  . Keflex [Cephalexin] Rash    ROS   No other specific complaints in a complete review of systems (except as listed in HPI above).  Objective  Vitals:   10/28/18 1618  BP: (!) 160/68  Pulse: 93  Resp: 14  Temp: (!) 97.3 F (36.3 C)  TempSrc: Oral  SpO2: 94%  Weight: 202 lb 14.4 oz (92 kg)  Height: 5\' 5"  (1.651 m)    Body mass index is 33.76 kg/m.  Nursing Note and Vital Signs reviewed.  Physical Exam Constitutional:      Appearance: Normal appearance.  HENT:     Head: Normocephalic and atraumatic.  Eyes:     Conjunctiva/sclera: Conjunctivae normal.  Neck:     Musculoskeletal: Normal range of motion and neck supple.  Cardiovascular:     Rate and Rhythm: Normal rate and regular rhythm.  Pulmonary:     Effort: Pulmonary effort is normal.     Breath sounds: Normal breath sounds. No wheezing.  Musculoskeletal: Normal range of motion.  Skin:      Neurological:     General: No focal deficit present.     Mental Status: She is alert and oriented to person, place, and time.       No results found for this or any previous visit (from the past 48 hour(s)).  Assessment & Plan 1. Adverse effect of vaccine, initial encounter Take famotidine and benadryl PO daily for up to one week- follow up if not resolved by then. If redness increased past line drawn please call immediately will send antibiotic- not likely cellulitis at this point discussed S&S for ROC and ER follow-up - diphenhydrAMINE (BENADRYL) injection 25 mg  2. Essential hypertension Elevated- discussed DASH, importance of medication adherence in detail, follow up with PCP

## 2018-10-29 ENCOUNTER — Other Ambulatory Visit: Payer: Self-pay | Admitting: Nurse Practitioner

## 2018-10-29 DIAGNOSIS — T50Z95A Adverse effect of other vaccines and biological substances, initial encounter: Secondary | ICD-10-CM

## 2018-11-03 ENCOUNTER — Ambulatory Visit: Payer: Self-pay

## 2018-11-03 DIAGNOSIS — E1165 Type 2 diabetes mellitus with hyperglycemia: Secondary | ICD-10-CM

## 2018-11-03 DIAGNOSIS — IMO0002 Reserved for concepts with insufficient information to code with codable children: Secondary | ICD-10-CM

## 2018-11-03 DIAGNOSIS — E1140 Type 2 diabetes mellitus with diabetic neuropathy, unspecified: Secondary | ICD-10-CM

## 2018-11-03 DIAGNOSIS — I10 Essential (primary) hypertension: Secondary | ICD-10-CM

## 2018-11-03 NOTE — Chronic Care Management (AMB) (Signed)
  Care Management Note    Meagan [name]. Alstonis a 68year old femalewho sees Dr. Laure Kidney primary care.Dr. Lenox Ahr the CCM team to consult the patient for assistance with chronic disease management related toher uncontrolled DM and diabetic medication titration. Referral was placed1/15/2020. Telephone outreach to patient today to introduce CCM services.  Was unable to reach patient via telephone today. I have left HIPAA compliant voicemail asking patient to return my call. (unsuccessful outreach #3).  Plan: Will follow-up within 7  business days via telephone.     Arrian Manson E. Rollene Rotunda, RN, BSN Nurse Care Coordinator [facility] / [facility] Care Management  [phone]

## 2018-11-20 ENCOUNTER — Other Ambulatory Visit: Payer: Self-pay | Admitting: Family Medicine

## 2018-11-20 ENCOUNTER — Other Ambulatory Visit: Payer: Self-pay | Admitting: Nurse Practitioner

## 2018-11-20 DIAGNOSIS — E1169 Type 2 diabetes mellitus with other specified complication: Secondary | ICD-10-CM

## 2018-11-20 DIAGNOSIS — E785 Hyperlipidemia, unspecified: Principal | ICD-10-CM

## 2018-11-20 DIAGNOSIS — T50Z95A Adverse effect of other vaccines and biological substances, initial encounter: Secondary | ICD-10-CM

## 2018-11-21 ENCOUNTER — Ambulatory Visit: Payer: Self-pay

## 2018-11-21 DIAGNOSIS — I10 Essential (primary) hypertension: Secondary | ICD-10-CM

## 2018-11-21 DIAGNOSIS — E1165 Type 2 diabetes mellitus with hyperglycemia: Secondary | ICD-10-CM

## 2018-11-21 DIAGNOSIS — E1140 Type 2 diabetes mellitus with diabetic neuropathy, unspecified: Secondary | ICD-10-CM

## 2018-11-21 DIAGNOSIS — IMO0002 Reserved for concepts with insufficient information to code with codable children: Secondary | ICD-10-CM

## 2018-11-21 NOTE — Chronic Care Management (AMB) (Signed)
  Chronic Care Management   Note  11/21/2018 Name: Meagan [name] MRN: [id number] DOB: Feb 25, 1951  Care Coordination: [name]. Alstonis a 68year old femalewho sees Dr. Laure Kidney primary care.Dr. Lenox Ahr the CCM team to consult the patient for assistance with chronic disease management related toher uncontrolled DM and diabetic medication titration. Referral was placed1/15/2020.   Three unsuccessful telephone outreach have been made with message left to please return call.  We will discontinue outreach calls but will gladly be available at any time to provide care management services to [facility]. [place] if she wishes to engage with the CCM Team in the future. Referral will be closed.    Kiptyn Rafuse E. Rollene Rotunda, RN, BSN Nurse Care Coordinator [facility] / [facility] Care Management  [phone]

## 2019-01-14 ENCOUNTER — Other Ambulatory Visit: Payer: Self-pay | Admitting: Family Medicine

## 2019-01-14 DIAGNOSIS — E1169 Type 2 diabetes mellitus with other specified complication: Secondary | ICD-10-CM

## 2019-01-14 DIAGNOSIS — E785 Hyperlipidemia, unspecified: Principal | ICD-10-CM

## 2019-01-21 ENCOUNTER — Other Ambulatory Visit: Payer: Self-pay

## 2019-01-21 DIAGNOSIS — E785 Hyperlipidemia, unspecified: Principal | ICD-10-CM

## 2019-01-21 DIAGNOSIS — E1169 Type 2 diabetes mellitus with other specified complication: Secondary | ICD-10-CM

## 2019-01-21 NOTE — Telephone Encounter (Signed)
Request for diabetes medication. Metformin to [facility]   Last office visit pertaining to diabetes: 10/28/2018   Lab Results  Component Value Date   HGBA1C 14.0 (A) 10/22/2018      Follow up on 01/28/2019

## 2019-01-21 NOTE — Telephone Encounter (Signed)
Lvm for pt to move her appt from next week to this Friday. It will be a virtual visit and pt will have to come by the office to do her A1C in which the nurse will come to the patient car to do this

## 2019-01-26 ENCOUNTER — Other Ambulatory Visit: Payer: Self-pay | Admitting: Family Medicine

## 2019-01-26 DIAGNOSIS — Z1231 Encounter for screening mammogram for malignant neoplasm of breast: Secondary | ICD-10-CM

## 2019-01-28 ENCOUNTER — Other Ambulatory Visit: Payer: Self-pay

## 2019-01-28 ENCOUNTER — Encounter: Payer: Self-pay | Admitting: Family Medicine

## 2019-01-28 ENCOUNTER — Ambulatory Visit (INDEPENDENT_AMBULATORY_CARE_PROVIDER_SITE_OTHER): Payer: BC Managed Care – PPO | Admitting: Family Medicine

## 2019-01-28 VITALS — BP 200/118

## 2019-01-28 DIAGNOSIS — E1165 Type 2 diabetes mellitus with hyperglycemia: Secondary | ICD-10-CM

## 2019-01-28 DIAGNOSIS — E1140 Type 2 diabetes mellitus with diabetic neuropathy, unspecified: Secondary | ICD-10-CM

## 2019-01-28 DIAGNOSIS — E1159 Type 2 diabetes mellitus with other circulatory complications: Secondary | ICD-10-CM | POA: Diagnosis not present

## 2019-01-28 DIAGNOSIS — E785 Hyperlipidemia, unspecified: Secondary | ICD-10-CM

## 2019-01-28 DIAGNOSIS — I10 Essential (primary) hypertension: Secondary | ICD-10-CM

## 2019-01-28 DIAGNOSIS — E1169 Type 2 diabetes mellitus with other specified complication: Secondary | ICD-10-CM

## 2019-01-28 DIAGNOSIS — Z9229 Personal history of other drug therapy: Secondary | ICD-10-CM

## 2019-01-28 DIAGNOSIS — I152 Hypertension secondary to endocrine disorders: Secondary | ICD-10-CM

## 2019-01-28 DIAGNOSIS — IMO0002 Reserved for concepts with insufficient information to code with codable children: Secondary | ICD-10-CM

## 2019-01-28 DIAGNOSIS — G4733 Obstructive sleep apnea (adult) (pediatric): Secondary | ICD-10-CM

## 2019-01-28 LAB — POCT GLYCOSYLATED HEMOGLOBIN (HGB A1C)
HbA1c POC (<> result, manual entry): 10.5 % (ref 4.0–5.6)
HbA1c, POC (controlled diabetic range): 10.5 % — AB (ref 0.0–7.0)
HbA1c, POC (prediabetic range): 10.5 % — AB (ref 5.7–6.4)
Hemoglobin A1C: 10.5 % — AB (ref 4.0–5.6)

## 2019-01-28 LAB — GLUCOSE, POCT (MANUAL RESULT ENTRY): POC Glucose: 211 mg/dl — AB (ref 70–99)

## 2019-01-28 MED ORDER — FLUCONAZOLE 150 MG PO TABS: 150.0000 mg | ORAL_TABLET | ORAL | 0 refills | Status: DC

## 2019-01-28 MED ORDER — OLMESARTAN MEDOXOMIL-HCTZ 40-25 MG PO TABS: 1.0000 | ORAL_TABLET | Freq: Every day | ORAL | 0 refills | Status: DC

## 2019-01-28 MED ORDER — INSULIN DEGLUDEC 100 UNIT/ML ~~LOC~~ SOPN: 20.0000 [IU] | PEN_INJECTOR | Freq: Every day | SUBCUTANEOUS | 0 refills | Status: DC

## 2019-01-28 MED ORDER — METFORMIN HCL ER 750 MG PO TB24: 1500.0000 mg | ORAL_TABLET | Freq: Every day | ORAL | 0 refills | Status: DC

## 2019-01-28 MED ORDER — SEMAGLUTIDE (1 MG/DOSE) 2 MG/1.5ML ~~LOC~~ SOPN: 1.0000 mg | PEN_INJECTOR | SUBCUTANEOUS | 0 refills | Status: DC

## 2019-01-28 MED ORDER — AMLODIPINE BESYLATE 5 MG PO TABS: 5.0000 mg | ORAL_TABLET | Freq: Every day | ORAL | 0 refills | Status: DC

## 2019-01-28 MED ORDER — DAPAGLIFLOZIN PROPANEDIOL 10 MG PO TABS: 10.0000 mg | ORAL_TABLET | Freq: Every day | ORAL | 0 refills | Status: DC

## 2019-01-28 NOTE — Progress Notes (Addendum)
Name: Meagan [name]   MRN: [id number]    DOB: [birth year]   Date:01/28/2019       Progress Note  Subjective  Chief Complaint  Chief Complaint  Patient presents with  . Follow-up  . Diabetes  . Hypertension  . Hyperlipidemia  . Obesity  . Allergies  . Medication Refill    I connected with  Meagan [name]  on 01/28/19 at  3:20 PM EDT by a video enabled telemedicine application and verified that I am speaking with the correct person using two identifiers.  I discussed the limitations of evaluation and management by telemedicine and the availability of in person appointments. The patient expressed understanding and agreed to proceed. Staff also discussed with the patient that there may be a patient responsible charge related to this service. Patient Location: at work  Provider Location: [facility]   HPI  DMII: she was seen in June 2019 and lost to follow up and when she came in 10/2018 her A1C was over 14 % , she had been out of her medications for the previous 2 months. She has microalbuminuria and was also out of ARB. We spent most of her visit discussing importance of compliance and the risk of uncontrolled DM, also explained that she may need to see Endo if we can not improve her levels. Today she stopped by just for bp and A1C. A1C is down at 10.5 %. She is using Ozempic, [place] but only at 16 units, metformin, skips medication only occasionally, we will increase dose of [place] and add Farxiga, explained that she needs to get labs done to check kidney function.  Uncontrolled HTN: bp out of control she states she had just finished a heated conversation with a  Co-worker and thinks that is why the bp was very high.  She denies headache, dizziness  or neuro deficit. We will add Norvasc today and she will return tomorrow for CMA visit and bp check   Hyperlipidemia:she has been taking statins every other day, however because of COVID-19 , she will return for bp check and  labs on Friday   Obesity: she has not been as active but has been eating more than usual because of COVID-19   OSA: she is wearing her CPAP machine every night, she denies morning headaches but still feels tired during the day.  Unchanged   Patient Active Problem List   Diagnosis Date Noted  . Morbid obesity ([place]) 10/22/2018  . Non compliance with medical treatment 03/26/2018  . Dyslipidemia associated with type 2 diabetes mellitus ([place]) 12/04/2016  . Uncontrolled type 2 diabetes mellitus with microalbuminuria, without long-term current use of insulin ([place]) 12/04/2016  . Seasonal allergies 02/01/2016  . OSA (obstructive sleep apnea) 10/06/2015  . Hypertensive retinopathy 10/06/2015  . Thyroid cyst 10/06/2015  . Dyslipidemia 10/06/2015  . Type 2 diabetes, uncontrolled, with neuropathy ([place]) 10/06/2015  . History of pneumonia 10/06/2015  . Chronic neck pain 10/06/2015  . Occlusion and stenosis of carotid artery without mention of cerebral infarction   . Hypertension, benign     Past Surgical History:  Procedure Laterality Date  . APPENDECTOMY  2000s  . BIOPSY THYROID  2006   [facility]  . BREAST EXCISIONAL BIOPSY Left 2000s   neg  . CARPAL TUNNEL RELEASE Left March 2015  . COLONOSCOPY  2011   DR.WOHL  . COLONOSCOPY WITH PROPOFOL N/A 07/11/2017   Procedure: COLONOSCOPY WITH PROPOFOL;  Surgeon: Jonathon Bellows, MD;  Location: [place];  Service: Gastroenterology;  Laterality: N/A;  . ganglion cyst removal     . KNEE SURGERY Left 08/2009  . KNEE SURGERY Right 04/07/2010  . ROTATOR CUFF REPAIR Right 2008  . TUBAL LIGATION      Family History  Problem Relation Age of Onset  . Breast cancer Maternal Aunt   . Hypertension Mother   . Kidney Stones Mother   . Hypertension Father   . Congestive Heart Failure Father   . Hypertension Paternal Grandfather   . Diabetes Sister   . Vision loss Sister   . Varicose Veins Sister   . Hypertension Sister   . Breast cancer Sister 73        Double Mastectomy  . Hypertension Sister     Social History   Socioeconomic History  . Marital status: Widowed    Spouse name: Not on file  . Number of children: 4  . Years of education: Not on file  . Highest education level: Master's degree (e.g., MA, MS, MEng, MEd, MSW, MBA)  Occupational History  . Not on file  Social Needs  . Financial resource strain: Not hard at all  . Food insecurity:    Worry: Never true    Inability: Never true  . Transportation needs:    Medical: No    Non-medical: No  Tobacco Use  . Smoking status: Never Smoker  . Smokeless tobacco: Never Used  Substance and Sexual Activity  . Alcohol use: No  . Drug use: No  . Sexual activity: Not Currently    Partners: Male    Comment: Widow for 14 years  Lifestyle  . Physical activity:    Days per week: 7 days    Minutes per session: 20 min  . Stress: Not at all  Relationships  . Social connections:    Talks on phone: More than three times a week    Gets together: More than three times a week    Attends religious service: More than 4 times per year    Active member of club or organization: Yes    Attends meetings of clubs or organizations: More than 4 times per year    Relationship status: Widowed  . Intimate partner violence:    Fear of current or ex partner: No    Emotionally abused: No    Physically abused: No    Forced sexual activity: No  Other Topics Concern  . Not on file  Social History Narrative  . Not on file     Current Outpatient Medications:  .  aspirin 81 MG tablet, Take 81 mg by mouth daily., Disp: , Rfl:  .  diphenhydrAMINE (BENADRYL) 12.5 MG/5ML elixir, Take 10 mLs (25 mg total) by mouth at bedtime., Disp: 118 mL, Rfl: 0 .  fluticasone (FLONASE) 50 MCG/ACT nasal spray, Place 2 sprays into both nostrils daily., Disp: 48 g, Rfl: 1 .  glucose blood (ONE TOUCH ULTRA TEST) test strip, USE AS DIRECTED TWICE A DAY, Disp: 100 each, Rfl: 1 .  Insulin Degludec 200 UNIT/ML SOPN,  INJECT 16-50 UNITS INTO THE SKIN DAILY., Disp: 9 mL, Rfl: 0 .  Insulin Pen Needle 32G X 6 MM MISC, 1 each by Does not apply route daily., Disp: 100 each, Rfl: 0 .  metFORMIN (GLUCOPHAGE-XR) 750 MG 24 hr tablet, Take 2 tablets (1,500 mg total) by mouth daily with breakfast., Disp: 180 tablet, Rfl: 0 .  olmesartan-hydrochlorothiazide (BENICAR HCT) 40-25 MG tablet, Take 1 tablet by mouth daily., Disp: 90 tablet,  Rfl: 0 .  rosuvastatin (CRESTOR) 20 MG tablet, Take 1 tablet (20 mg total) by mouth daily., Disp: 90 tablet, Rfl: 0 .  Semaglutide,0.25 or 0.5MG /DOS, (OZEMPIC, 0.25 OR 0.5 MG/DOSE,) 2 MG/1.5ML SOPN, Inject 0.5 mg into the skin once a week., Disp: 2 pen, Rfl: 2 .  levocetirizine (XYZAL) 5 MG tablet, Take 5 mg by mouth as needed for allergies., Disp: , Rfl:  .  montelukast (SINGULAIR) 10 MG tablet, Take 1 tablet (10 mg total) by mouth at bedtime. (Patient not taking: Reported on 01/28/2019), Disp: 30 tablet, Rfl: 2  Allergies  Allergen Reactions  . Keflex [Cephalexin] Rash    I personally reviewed active problem list, medication list, allergies, family history, social history with the patient/caregiver today.   ROS  Ten systems reviewed and is negative except as mentioned in HPI    Objective  Virtual encounter, vitals not obtained.  Physical Exam  Awake, alert and oriented  Results for orders placed or performed in visit on 01/28/19 (from the past 72 hour(s))  POCT Glucose (CBG)     Status: Abnormal   Collection Time: 01/28/19  3:57 PM  Result Value Ref Range   POC Glucose 211 (A) 70 - 99 mg/dl  POCT HgB A1C     Status: Abnormal   Collection Time: 01/28/19  4:04 PM  Result Value Ref Range   Hemoglobin A1C 10.5 (A) 4.0 - 5.6 %   HbA1c POC (<> result, manual entry) 10.5 4.0 - 5.6 %   HbA1c, POC (prediabetic range) 10.5 (A) 5.7 - 6.4 %   HbA1c, POC (controlled diabetic range) 10.5 (A) 0.0 - 7.0 %    PHQ2/9: Depression screen [facility] 2/9 01/28/2019 03/26/2018 06/12/2017 12/04/2016  07/06/2016  Decreased Interest 0 0 0 0 0  Down, Depressed, Hopeless 0 0 0 0 0  PHQ - 2 Score 0 0 0 0 0  Altered sleeping 0 0 - - -  Tired, decreased energy 0 1 - - -  Change in appetite 0 0 - - -  Feeling bad or failure about yourself  0 0 - - -  Trouble concentrating 0 0 - - -  Moving slowly or fidgety/restless 0 0 - - -  Suicidal thoughts 0 0 - - -  PHQ-9 Score 0 1 - - -  Difficult doing work/chores Not difficult at all Not difficult at all - - -   PHQ-2/9 Result is negative.    Fall Risk: Fall Risk  01/28/2019 10/28/2018 10/22/2018 03/26/2018 06/12/2017  Falls in the past year? 0 0 0 No No  Number falls in past yr: 0 0 - - -  Injury with Fall? 0 0 - - -     Assessment & Plan   1. Type 2 diabetes, uncontrolled, with neuropathy (HCC)  - POCT HgB A1C - POCT Glucose (CBG) - Microalbumin / creatinine urine ratio - dapagliflozin propanediol (FARXIGA) 10 MG TABS tablet; Take 10 mg by mouth daily.  Dispense: 90 tablet; Refill: 0 - insulin degludec (TRESIBA FLEXTOUCH) 100 UNIT/ML SOPN FlexTouch Pen; Inject 0.2-0.5 mLs (20-50 Units total) into the skin daily.  Dispense: 9 mL; Refill: 0  2. Hypertension, uncontrolled  - olmesartan-hydrochlorothiazide (BENICAR HCT) 40-25 MG tablet; Take 1 tablet by mouth daily.  Dispense: 90 tablet; Refill: 0 - amLODipine (NORVASC) 5 MG tablet; Take 1-2 tablets (5-10 mg total) by mouth daily.  Dispense: 60 tablet; Refill: 0 - COMPLETE METABOLIC PANEL WITH GFR - TSH - Microalbumin / creatinine urine ratio  3. Dyslipidemia  associated with type 2 diabetes mellitus (HCC)  - metFORMIN (GLUCOPHAGE-XR) 750 MG 24 hr tablet; Take 2 tablets (1,500 mg total) by mouth daily with breakfast.  Dispense: 180 tablet; Refill: 0 - Semaglutide, 1 MG/DOSE, (OZEMPIC, 1 MG/DOSE,) 2 MG/1.5ML SOPN; Inject 1 mg into the skin once a week.  Dispense: 6 pen; Refill: 0 - dapagliflozin propanediol (FARXIGA) 10 MG TABS tablet; Take 10 mg by mouth daily.  Dispense: 90 tablet; Refill:  0 - insulin degludec (TRESIBA FLEXTOUCH) 100 UNIT/ML SOPN FlexTouch Pen; Inject 0.2-0.5 mLs (20-50 Units total) into the skin daily.  Dispense: 9 mL; Refill: 0  4. Hypertension associated with diabetes ([place])  - amLODipine (NORVASC) 5 MG tablet; Take 1-2 tablets (5-10 mg total) by mouth daily.  Dispense: 60 tablet; Refill: 0 - COMPLETE METABOLIC PANEL WITH GFR - CBC with Differential/Platelet  5. OSA (obstructive sleep apnea)  Continue CPAP   6. Dyslipidemia  - Lipid panel  7. Hx of high risk medication treatment  - fluconazole (DIFLUCAN) 150 MG tablet; Take 1 tablet (150 mg total) by mouth every other day.  Dispense: 3 tablet; Refill: 0  I discussed the assessment and treatment plan with the patient. The patient was provided an opportunity to ask questions and all were answered. The patient agreed with the plan and demonstrated an understanding of the instructions.  The patient was advised to call back or seek an in-person evaluation if the symptoms worsen or if the condition fails to improve as anticipated.  I provided 31 minutes of non-face-to-face time during this encounter.

## 2019-01-29 ENCOUNTER — Telehealth: Payer: Self-pay | Admitting: Family Medicine

## 2019-01-29 NOTE — Telephone Encounter (Signed)
LVM for patient call our office to schedule a nurse visit for blood pressure check for  Friday, 4/244/2020.

## 2019-01-30 ENCOUNTER — Ambulatory Visit: Payer: Self-pay

## 2019-01-30 ENCOUNTER — Ambulatory Visit (INDEPENDENT_AMBULATORY_CARE_PROVIDER_SITE_OTHER): Payer: BC Managed Care – PPO

## 2019-01-30 VITALS — BP 160/100 | HR 95

## 2019-01-30 DIAGNOSIS — I10 Essential (primary) hypertension: Secondary | ICD-10-CM

## 2019-01-30 DIAGNOSIS — Z0130 Encounter for examination of blood pressure without abnormal findings: Secondary | ICD-10-CM

## 2019-01-30 DIAGNOSIS — E1165 Type 2 diabetes mellitus with hyperglycemia: Principal | ICD-10-CM

## 2019-01-30 DIAGNOSIS — E1140 Type 2 diabetes mellitus with diabetic neuropathy, unspecified: Secondary | ICD-10-CM

## 2019-01-30 DIAGNOSIS — IMO0002 Reserved for concepts with insufficient information to code with codable children: Secondary | ICD-10-CM

## 2019-01-30 DIAGNOSIS — E1159 Type 2 diabetes mellitus with other circulatory complications: Secondary | ICD-10-CM

## 2019-01-30 NOTE — Chronic Care Management (AMB) (Signed)
  Care Management   Note  01/30/2019 Name: JAMARIS BIERNAT MRN: [id number] DOB: Mar 09, 1951  [name]. Aitken is a 68 year old female who sees Dr. Steele Sizer for primary care. Dr. Ancil Boozer asked the CCM team to consult the patient for chronic care management secondary to DM and HTN education and titration of Tresiba. Patient has a history of but not limited to HTN, OSA, DM, and Dyslipidemia. Referral was placed 01/28/2019 during last office visit.  Telephone outreach to patient today to introduce CCM services. Patient was initially referred 10/22/2018 but referral closed secondary to inability to contact and engage.  SDOH (Social Determinants of Health) screening performed today. See Care Plan Entry related to challenges with: None  Ms. Parillo was given information about Care Management services today including:  1. Case Management services include personalized support from designated clinical staff supervised by a physician, including individualized plan of care and coordination with other care providers 2. 24/7 contact phone numbers for assistance for urgent and routine care needs. 3. The patient may stop CCM services at any time (effective at the end of the month) by phone call to the office staff.   Patient agreed to services and verbal consent obtained.     Plan: Initial assessment with CCM RN CM scheduled for 02/05/2019 at 11:30 and [facility] Pharmacist 02/10/2019    Joud Pettinato E. Rollene Rotunda, RN, BSN Nurse Care Coordinator [facility] / [facility] Care Management  [phone]

## 2019-01-30 NOTE — Patient Instructions (Signed)
1. Thank You for allowing the CCM (Chronic Care Management) Team to assist you with your healthcare goals!! CCM RN CM looks forward to speaking with you on 02/05/2019 2. Please bring ALL medications to your appointment! If you have a blood sugar meter or a blood pressure monitor at home, bring those as well.  3.  Contact the CCM Team if you have any question or need to reschedule your initial visit.  CCM (Chronic Care Management) Team   [name] RN, BSN Nurse Care Coordinator  [phone]  Ruben Reason PharmD  Clinical Pharmacist  [phone]   Elliot Gurney, LCSW Clinical Social Worker [phone]  Ms. Adami was given information about Care Management services today including:  1. Case Management services includes personalized support from designated clinical staff supervised by her physician, including individualized plan of care and coordination with other care providers 2. 24/7 contact phone numbers for assistance for urgent and routine care needs. 3. The patient may stop case management services at any time by phone call to the office staff.  Patient agreed to services and verbal consent obtained.

## 2019-01-30 NOTE — Progress Notes (Signed)
Patient is here for a blood pressure check. Patient denies chest pain, palpitations, shortness of breath or visual disturbances. At previous visit blood pressure was 200/118. Today during nurse visit first check blood pressure was 160/150 with a heart rate of 95. Blood pressure was checked in her right arm and it was 180/116. She does blood pressure medications, but had not taking them doing her visit. She was on her way to pick up her medication.  PCP advised patient to take her medication daily and follow up in one week.

## 2019-02-05 ENCOUNTER — Ambulatory Visit: Payer: Self-pay

## 2019-02-05 DIAGNOSIS — E1165 Type 2 diabetes mellitus with hyperglycemia: Principal | ICD-10-CM

## 2019-02-05 DIAGNOSIS — I10 Essential (primary) hypertension: Secondary | ICD-10-CM

## 2019-02-05 DIAGNOSIS — E1140 Type 2 diabetes mellitus with diabetic neuropathy, unspecified: Secondary | ICD-10-CM

## 2019-02-05 DIAGNOSIS — IMO0002 Reserved for concepts with insufficient information to code with codable children: Secondary | ICD-10-CM

## 2019-02-05 NOTE — Chronic Care Management (AMB) (Signed)
   Care Management   Note  02/05/2019 Name: Meagan [name] MRN: [id number] DOB: [birth year]  Care Coordination: Incoming call from Meagan [name], 68 year old female patient of Dr. Steele Sizer, who was referred to [facility] RN CM for DM and HTN education. Meagan [name] was scheduled for an initial telephone assessment and goal establishing appointment today at 11:30. Meagan [name] calls to report she is unable to make todays appointment secondary to a work obligation. Meagan [name] wishes to reschedule her appointment for next week.   Telephone appointment with CCM RN CM scheduled for: 02/12/2019 at 11:30    [name] E. Rollene Rotunda, RN, BSN Nurse Care Coordinator [facility] / [facility] Care Management  [phone]

## 2019-02-10 ENCOUNTER — Ambulatory Visit: Payer: Self-pay | Admitting: Pharmacist

## 2019-02-10 ENCOUNTER — Telehealth: Payer: Self-pay

## 2019-02-10 DIAGNOSIS — E1169 Type 2 diabetes mellitus with other specified complication: Secondary | ICD-10-CM

## 2019-02-10 DIAGNOSIS — IMO0002 Reserved for concepts with insufficient information to code with codable children: Secondary | ICD-10-CM

## 2019-02-10 DIAGNOSIS — I10 Essential (primary) hypertension: Secondary | ICD-10-CM

## 2019-02-10 DIAGNOSIS — E1140 Type 2 diabetes mellitus with diabetic neuropathy, unspecified: Secondary | ICD-10-CM

## 2019-02-12 ENCOUNTER — Other Ambulatory Visit: Payer: Self-pay

## 2019-02-12 ENCOUNTER — Ambulatory Visit: Payer: Self-pay

## 2019-02-12 DIAGNOSIS — E1140 Type 2 diabetes mellitus with diabetic neuropathy, unspecified: Secondary | ICD-10-CM

## 2019-02-12 DIAGNOSIS — E1165 Type 2 diabetes mellitus with hyperglycemia: Principal | ICD-10-CM

## 2019-02-12 DIAGNOSIS — I10 Essential (primary) hypertension: Secondary | ICD-10-CM

## 2019-02-12 DIAGNOSIS — IMO0002 Reserved for concepts with insufficient information to code with codable children: Secondary | ICD-10-CM

## 2019-02-12 NOTE — Chronic Care Management (AMB) (Signed)
Care Management   Initial Visit Note  02/15/2019 Name: Meagan [name] MRN: [id number] DOB: 08/20/1951   Subjective: "I am trying to do my best to manage my health but I don't like to take medications and I like to eat good food"  Objective:  BP Readings from Last 3 Encounters:  01/30/19 (!) 160/100  01/28/19 (!) 200/118  10/28/18 (!) 180/110   Lab Results  Component Value Date   HGBA1C 10.5 (A) 01/28/2019   HGBA1C 10.5 01/28/2019   HGBA1C 10.5 (A) 01/28/2019   HGBA1C 10.5 (A) 01/28/2019   Lab Results  Component Value Date   MICROALBUR 20 03/26/2018   LDLCALC 132 (H) 03/26/2018   CREATININE 0.84 03/26/2018    Assessment: Meagan [name]. Meagan [name] is a 68 year old femalewho sees Dr. Steele Sizer for primary care. Dr. Lenox Ahr the CCM team to consult the patient for chronic care management secondary to DM and HTN education and titration of Tresiba. Patient has a history of but not limited to HTN, OSA, DM, and Dyslipidemia. Referral was placed4/22/2020 during last office visit. Telephone outreach to patient today for initial assessment and goal setting. Patient was initially referred 10/22/2018 but referral closed secondary to inability to contact and engage.  Review of patient status, including review of consultants reports, relevant laboratory and other test results, and collaboration with appropriate care team members and the patient's provider was performed as part of comprehensive patient evaluation and provision of chronic care management services.     Goals Addressed            This Visit's Progress    I have brought my A1C down from 14 but Dr. Ancil Boozer wants to be aggressive (pt-stated)       Meagan [name] has been diagnoses with diabetes for many years. She admits to not enjoying taking medications. In 10/2018 her A1C was 14.0 and she has been able to bring it down with medications and "a few" dietary monifications. She does not exercise daily or count her carbohydrates. 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 Meagan [name] understands the complications that could occur as a result of her not lowering her A1C and is appreciative of any education CCM RN CM can provide. She is currently not checking her blood sugars as she is not sure she has everything needed but was unable to discuss during todays visit as patient was in her office and did not bring her meter with her to work.   Current Barriers:   Knowledge Deficits related to basic Diabetes pathophysiology and self care/management  Lack of adequate personal glucometer to monitor blood sugar (patient states she is unsure if she has everything needed for glucometer-not available to discuss today as patient did not bring with her to work)    Nurse Case Manager Clinical Goal(s):   Over the next 14 days, patient will demonstrate improved adherence to prescribed treatment plan for diabetes self care/management as evidenced by checking blood glucose levels as prescribed, adhering to ADA/low carb diet, daily exercise, and medication adherence  Interventions:   Provided education to patient re: diabetes  Reviewed medications with patient and discussed importance of medication adherence  Discussed plans with patient for ongoing care management follow up and provided patient with direct contact information for care management team  Provided patient with written educational materials related to hypo and hyperglycemia and importance of correct treatment  Reviewed scheduled/upcoming provider appointments including:   Advised patient, when  glucometer is available, to check cbg twice daily and record, calling Dr. Ancil Boozer for findings outside established parameters.    Patient Self Care Activities:   Self administers medications as prescribed  Attends all scheduled provider appointments  Checks blood sugars as prescribed and utilize hyper and hypoglycemia protocol as  needed  Adhere to ADA diet as discussed  Plan:   RNCM will email educational materials discussed today and follow up in 2 weeks   Explore patients insurance benefits for additional support for chronic conditions   Initial goal documentation      My blood pressure has really been high lately" (pt-stated)       Meagan [name] has had hypertension for many years. She felt it was well controlled until she had a reaction to the pneumonia vaccine this year and her chronic conditions were found not to be controlled at all. She has had recent significant hypertension without symptoms as indicated above. 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 Since COVID-19, and the beginning or remote learning, she has not had her pressure checked except at her MD office. She is taking her medications as prescribed, but written a range of 1-2 tabs and because of her dislike for medications, chooses to take only 1 tab of Norvasc 5 mg. Upon med review today, she also report not having picked up all her medications and in need to return to the pharmacy to pick up prescription for crestor.   Current Barriers:   Knowledge Deficits related to basic understanding of hypertension pathophysiology and self care management  Non-adherence to prescribed medication regimen  Nurse Case Manager Clinical Goal(s):   Over the next 14 days, patient will verbalize understanding of plan for hypertension management  Over the next 14 days, patient will demonstrate improved health management independence as evidenced by checking blood pressure as directed via BP monitor at local drug stores and notifying PCP if SBP>140s or DBP > 90s, taking all medications as prescribe, and adhering to a low sodium diet as discussed.   Interventions:   Evaluation of current treatment plan related to hypertension self management and patient's adherence  to plan as established by provider.  Provided education to patient re: stroke prevention, s/s of heart attack and stroke, DASH diet, complications of uncontrolled blood pressure  Reviewed medications with patient and discussed importance of compliance  Discussed plans with patient for ongoing care management follow up and provided patient with direct contact information for care management team  Advised patient, providing education and rationale, to monitor blood pressure daily and record, calling PCP for findings outside established parameters.   Reviewed scheduled/upcoming provider appointments including:   Patient Self Care Activities:   Self administers medications as prescribed  Attends all scheduled provider appointments  Calls provider office for new concerns, questions, or BP outside discussed parameters  Checks BP and records as discussed  Follows a low sodium diet/DASH diet  Initial goal documentation         Follow up plan:  Telephone follow up appointment with CCM team member scheduled for: 2 weeks  Ms. Warzecha was given information about Care Management services today including:  1. Case Management services include personalized support from designated clinical staff supervised by a physician, including individualized plan of care and coordination with other care providers 2. 24/7 contact phone numbers for assistance for urgent and routine care needs. 3. The patient may stop  CCM services at any time (effective at the end of the month) by phone call to the office staff.  Patient agreed to services and verbal consent obtained.    Berenis Corter E. Rollene Rotunda, RN, BSN Nurse Care Coordinator [facility] / [facility] Care Management  [phone]

## 2019-02-15 NOTE — Patient Instructions (Signed)
Thank you allowing the Chronic Care Management Team to be a part of your care! It was a pleasure speaking with you today!  1. Please take ALL medications as prescribed. 2. It is important that you know what your blood pressure is daily. Please consider purchasing a BP monitor at any local pharmacy. [facility], [facility], [facility], [facility] 3. I know salt/sodium is a flavor enhancer but consider using things like Ms. Dash, fresh or dried herbs, or any low or no sodium seasonings. Purchase low or no sodium broths. Thing like eating out, canned or processed foods have the most hidden sodium in them. 4. Please try to trouble shoot your glucose monitor. You need strips, lancets, and preferable alcohol pads but you can wash your hands really well prior to testing. If you can't get your monitor to work please let me know. Dr. Ancil Boozer would like for you to check your sugars twice daily. Once before breakfast and once just prior to your evening mail. Record so we can discuss. 5. Please review all the education materials I am sending to you. If you have any questions, please feel free to give me a call. If not I will touch base with you in two weeks. 6. Lastly, you will also receive an email from [facility]. This is an educations site that we utilize.   CCM (Chronic Care Management) Team   [name] RN, BSN Nurse Care Coordinator  [phone]  Ruben Reason PharmD  Clinical Pharmacist  [phone]   Elliot Gurney, LCSW Clinical Social Worker [phone]  Goals Addressed            This Visit's Progress   . I have brought my A1C down from 14 but Dr. Ancil Boozer wants to be aggressive (pt-stated)       Current Barriers:  Marland Kitchen Knowledge Deficits related to basic Diabetes pathophysiology and self care/management . Lack of adequate personal glucometer to monitor blood sugar (patient states she is unsure if she has everything needed for glucometer-not available to discuss today as patient did not bring with  her to work)    Nurse Case Manager Clinical Goal(s):  Marland Kitchen Over the next 14 days, patient will demonstrate improved adherence to prescribed treatment plan for diabetes self care/management as evidenced by checking blood glucose levels as prescribed, adhering to ADA/low carb diet, daily exercise, and medication adherence  Interventions:  . Provided education to patient re: diabetes . Reviewed medications with patient and discussed importance of medication adherence . Discussed plans with patient for ongoing care management follow up and provided patient with direct contact information for care management team . Provided patient with written educational materials related to hypo and hyperglycemia and importance of correct treatment . Reviewed scheduled/upcoming provider appointments including:  . Advised patient, when glucometer is available, to check cbg twice daily and record, calling Dr. Ancil Boozer for findings outside established parameters.    Patient Self Care Activities:  . Self administers medications as prescribed . Attends all scheduled provider appointments . Checks blood sugars as prescribed and utilize hyper and hypoglycemia protocol as needed . Adhere to ADA diet as discussed  Plan:  . RNCM will email educational materials discussed today and follow up in 2 weeks  . Explore patients insurance benefits for additional support for chronic conditions   Initial goal documentation     . My blood pressure has really been high lately" (pt-stated)       Current Barriers:  Marland Kitchen Knowledge Deficits related to basic understanding of  hypertension pathophysiology and self care management . Non-adherence to prescribed medication regimen  Nurse Case Manager Clinical Goal(s):  Marland Kitchen Over the next 14 days, patient will verbalize understanding of plan for hypertension management . Over the next 14 days, patient will demonstrate improved health management independence as evidenced by checking blood  pressure as directed via BP monitor at local drug stores and notifying PCP if SBP>140s or DBP > 90s, taking all medications as prescribe, and adhering to a low sodium diet as discussed.   Interventions:  . Evaluation of current treatment plan related to hypertension self management and patient's adherence to plan as established by provider. . Provided education to patient re: stroke prevention, s/s of heart attack and stroke, DASH diet, complications of uncontrolled blood pressure . Reviewed medications with patient and discussed importance of compliance . Discussed plans with patient for ongoing care management follow up and provided patient with direct contact information for care management team . Advised patient, providing education and rationale, to monitor blood pressure daily and record, calling PCP for findings outside established parameters.  . Reviewed scheduled/upcoming provider appointments including:   Patient Self Care Activities:  . Self administers medications as prescribed . Attends all scheduled provider appointments . Calls provider office for new concerns, questions, or BP outside discussed parameters . Checks BP and records as discussed . Follows a low sodium diet/DASH diet  Initial goal documentation        Print copy of patient instructions provided. via email  Telephone follow up appointment with CCM team member scheduled for: 2 weeks   Stroke Prevention Some medical conditions and lifestyle choices can lead to a higher risk for a stroke. You can help to prevent a stroke by making nutrition, lifestyle, and other changes. What nutrition changes can be made?   Eat healthy foods. ? Choose foods that are high in fiber. These include:  Fresh fruits.  Fresh vegetables.  Whole grains. ? Eat at least 5 or more servings of fruits and vegetables each day. Try to fill half of your plate at each meal with fruits and vegetables. ? Choose lean protein foods. These  include:  Lowfat (lean) cuts of meat.  Chicken without skin.  Fish.  Tofu.  Beans.  Nuts. ? Eat low-fat dairy products. ? Avoid foods that:  Are high in salt (sodium).  Have saturated fat.  Have trans fat.  Have cholesterol.  Are processed.  Are premade.  Follow eating guidelines as told by your doctor. These may include: ? Reducing how many calories you eat and drink each day. ? Limiting how much salt you eat or drink each day to 1,500 milligrams (mg). ? Using only healthy fats for cooking. These include:  Olive oil.  Canola oil.  Sunflower oil. ? Counting how many carbohydrates you eat and drink each day. What lifestyle changes can be made?  Try to stay at a healthy weight. Talk to your doctor about what a good weight is for you.  Get at least 30 minutes of moderate physical activity at least 5 days a week. This can include: ? Fast walking. ? Biking. ? Swimming.  Do not use any products that have nicotine or tobacco. This includes cigarettes and e-cigarettes. If you need help quitting, ask your doctor. Avoid being around tobacco smoke in general.  Limit how much alcohol you drink to no more than 1 drink a day for nonpregnant women and 2 drinks a day for men. One drink equals 12 oz of beer,  5 oz of wine, or 1 oz of hard liquor.  Do not use drugs.  Avoid taking birth control pills. Talk to your doctor about the risks of taking birth control pills if: ? You are over 11 years old. ? You smoke. ? You get migraines. ? You have had a blood clot. What other changes can be made?  Manage your cholesterol. ? It is important to eat a healthy diet. ? If your cholesterol cannot be managed through your diet, you may also need to take medicines. Take medicines as told by your doctor.  Manage your diabetes. ? It is important to eat a healthy diet and to exercise regularly. ? If your blood sugar cannot be managed through diet and exercise, you may need to take  medicines. Take medicines as told by your doctor.  Control your high blood pressure (hypertension). ? Try to keep your blood pressure below 130/80. This can help lower your risk of stroke. ? It is important to eat a healthy diet and to exercise regularly. ? If your blood pressure cannot be managed through diet and exercise, you may need to take medicines. Take medicines as told by your doctor. ? Ask your doctor if you should check your blood pressure at home. ? Have your blood pressure checked every year. Do this even if your blood pressure is normal.  Talk to your doctor about getting checked for a sleep disorder. Signs of this can include: ? Snoring a lot. ? Feeling very tired.  Take over-the-counter and prescription medicines only as told by your doctor. These may include aspirin or blood thinners (antiplatelets or anticoagulants).  Make sure that any other medical conditions you have are managed. Where to find more information  American Stroke Association: www.strokeassociation.org  National Stroke Association: www.stroke.org Get help right away if:  You have any symptoms of stroke. "BE FAST" is an easy way to remember the main warning signs: ? B - Balance. Signs are dizziness, sudden trouble walking, or loss of balance. ? E - Eyes. Signs are trouble seeing or a sudden change in how you see. ? F - Face. Signs are sudden weakness or loss of feeling of the face, or the face or eyelid drooping on one side. ? A - Arms. Signs are weakness or loss of feeling in an arm. This happens suddenly and usually on one side of the body. ? S - Speech. Signs are sudden trouble speaking, slurred speech, or trouble understanding what people say. ? T - Time. Time to call emergency services. Write down what time symptoms started.  You have other signs of stroke, such as: ? A sudden, very bad headache with no known cause. ? Feeling sick to your stomach (nausea). ? Throwing up (vomiting). ? Jerky  movements you cannot control (seizure). These symptoms may represent a serious problem that is an emergency. Do not wait to see if the symptoms will go away. Get medical help right away. Call your local emergency services (911 in the [place] Do not drive yourself to the hospital. Summary  You can prevent a stroke by eating healthy, exercising, not smoking, drinking less alcohol, and treating other health problems, such as diabetes, high blood pressure, or high cholesterol.  Do not use any products that contain nicotine or tobacco, such as cigarettes and e-cigarettes.  Get help right away if you have any signs or symptoms of a stroke. This information is not intended to replace advice given to you by your health care provider.  Make sure you discuss any questions you have with your health care provider. Document Released: 03/25/2012 Document Revised: 12/26/2016 Document Reviewed: 12/26/2016 Elsevier Interactive Patient Education  2019 [employer]  What are the signs or symptoms? Symptoms of this condition include:  Chest pain. It may feel like: ? Crushing or squeezing. ? Tightness, pressure, fullness, or heaviness.  Pain in the arm, neck, jaw, back, or upper body.  Shortness of breath.  Heartburn.  Indigestion.  Nausea.  Cold sweats.  Feeling tired.  Sudden lightheadedness.   DASH Eating Plan DASH stands for "Dietary Approaches to Stop Hypertension." The DASH eating plan is a healthy eating plan that has been shown to reduce high blood pressure (hypertension). It may also reduce your risk for type 2 diabetes, heart disease, and stroke. The DASH eating plan may also help with weight loss. What are tips for following this plan?  General guidelines  Avoid eating more than 2,300 mg (milligrams) of salt (sodium) a day. If you have hypertension, you may need to reduce your sodium intake to 1,500 mg a day.  Limit alcohol intake to no more than 1  drink a day for nonpregnant women and 2 drinks a day for men. One drink equals 12 oz of beer, 5 oz of wine, or 1 oz of hard liquor.  Work with your health care provider to maintain a healthy body weight or to lose weight. Ask what an ideal weight is for you.  Get at least 30 minutes of exercise that causes your heart to beat faster (aerobic exercise) most days of the week. Activities may include walking, swimming, or biking.  Work with your health care provider or diet and nutrition specialist (dietitian) to adjust your eating plan to your individual calorie needs. Reading food labels   Check food labels for the amount of sodium per serving. Choose foods with less than 5 percent of the Daily Value of sodium. Generally, foods with less than 300 mg of sodium per serving fit into this eating plan.  To find whole grains, look for the word "whole" as the first word in the ingredient list. Shopping  Buy products labeled as "low-sodium" or "no salt added."  Buy fresh foods. Avoid canned foods and premade or frozen meals. Cooking  Avoid adding salt when cooking. Use salt-free seasonings or herbs instead of table salt or sea salt. Check with your health care provider or pharmacist before using salt substitutes.  Do not fry foods. Cook foods using healthy methods such as baking, boiling, grilling, and broiling instead.  Cook with heart-healthy oils, such as olive, canola, soybean, or sunflower oil. Meal planning  Eat a balanced diet that includes: ? 5 or more servings of fruits and vegetables each day. At each meal, try to fill half of your plate with fruits and vegetables. ? Up to 6-8 servings of whole grains each day. ? Less than 6 oz of lean meat, poultry, or fish each day. A 3-oz serving of meat is about the same size as a deck of cards. One egg equals 1 oz. ? 2 servings of low-fat dairy each day. ? A serving of nuts, seeds, or beans 5 times each week. ? Heart-healthy fats. Healthy fats  called Omega-3 fatty acids are found in foods such as flaxseeds and coldwater fish, like sardines, salmon, and mackerel.  Limit how much you eat of the following: ? Canned or prepackaged foods. ? Food that is high in trans fat, such  as fried foods. ? Food that is high in saturated fat, such as fatty meat. ? Sweets, desserts, sugary drinks, and other foods with added sugar. ? Full-fat dairy products.  Do not salt foods before eating.  Try to eat at least 2 vegetarian meals each week.  Eat more home-cooked food and less restaurant, buffet, and fast food.  When eating at a restaurant, ask that your food be prepared with less salt or no salt, if possible. What foods are recommended? The items listed may not be a complete list. Talk with your dietitian about what dietary choices are best for you. Grains Whole-grain or whole-wheat bread. Whole-grain or whole-wheat pasta. Brown rice. [place]. Bulgur. Whole-grain and low-sodium cereals. Pita bread. Low-fat, low-sodium crackers. Whole-wheat flour tortillas. Vegetables Fresh or frozen vegetables (raw, steamed, roasted, or grilled). Low-sodium or reduced-sodium tomato and vegetable juice. Low-sodium or reduced-sodium tomato sauce and tomato paste. Low-sodium or reduced-sodium canned vegetables. Fruits All fresh, dried, or frozen fruit. Canned fruit in natural juice (without added sugar). Meat and other protein foods Skinless chicken or [place]. Ground chicken or [place]. Pork with fat trimmed off. Fish and seafood. Egg whites. Dried beans, peas, or lentils. Unsalted nuts, nut butters, and seeds. Unsalted canned beans. Lean cuts of beef with fat trimmed off. Low-sodium, lean deli meat. Dairy Low-fat (1%) or fat-free (skim) milk. Fat-free, low-fat, or reduced-fat cheeses. Nonfat, low-sodium ricotta or cottage cheese. Low-fat or nonfat yogurt. Low-fat, low-sodium cheese. Fats and oils Soft margarine without trans fats. Vegetable oil. Low-fat,  reduced-fat, or light mayonnaise and salad dressings (reduced-sodium). Canola, safflower, olive, soybean, and sunflower oils. Avocado. Seasoning and other foods Herbs. Spices. Seasoning mixes without salt. Unsalted popcorn and pretzels. Fat-free sweets. What foods are not recommended? The items listed may not be a complete list. Talk with your dietitian about what dietary choices are best for you. Grains Baked goods made with fat, such as croissants, muffins, or some breads. Dry pasta or rice meal packs. Vegetables Creamed or fried vegetables. Vegetables in a cheese sauce. Regular canned vegetables (not low-sodium or reduced-sodium). Regular canned tomato sauce and paste (not low-sodium or reduced-sodium). Regular tomato and vegetable juice (not low-sodium or reduced-sodium). Angie Fava. Olives. Fruits Canned fruit in a light or heavy syrup. Fried fruit. Fruit in cream or butter sauce. Meat and other protein foods Fatty cuts of meat. Ribs. Fried meat. Berniece Salines. Sausage. Bologna and other processed lunch meats. Salami. Fatback. Hotdogs. Bratwurst. Salted nuts and seeds. Canned beans with added salt. Canned or smoked fish. Whole eggs or egg yolks. Chicken or [place] with skin. Dairy Whole or 2% milk, cream, and half-and-half. Whole or full-fat cream cheese. Whole-fat or sweetened yogurt. Full-fat cheese. Nondairy creamers. Whipped toppings. Processed cheese and cheese spreads. Fats and oils Butter. Stick margarine. Lard. Shortening. Ghee. Bacon fat. Tropical oils, such as coconut, palm kernel, or palm oil. Seasoning and other foods Salted popcorn and pretzels. Onion salt, garlic salt, seasoned salt, table salt, and sea salt. Worcestershire sauce. Tartar sauce. Barbecue sauce. Teriyaki sauce. Soy sauce, including reduced-sodium. Steak sauce. Canned and packaged gravies. Fish sauce. Oyster sauce. Cocktail sauce. Horseradish that you find on the shelf. Ketchup. Mustard. Meat flavorings and tenderizers.  Bouillon cubes. Hot sauce and Tabasco sauce. Premade or packaged marinades. Premade or packaged taco seasonings. Relishes. Regular salad dressings. Where to find more information:  National Heart, Lung, and [place]: https://wilson-eaton.com/  American Heart Association: www.heart.org Summary  The DASH eating plan is a healthy eating plan that has been shown  to reduce high blood pressure (hypertension). It may also reduce your risk for type 2 diabetes, heart disease, and stroke.  With the DASH eating plan, you should limit salt (sodium) intake to 2,300 mg a day. If you have hypertension, you may need to reduce your sodium intake to 1,500 mg a day.  When on the DASH eating plan, aim to eat more fresh fruits and vegetables, whole grains, lean proteins, low-fat dairy, and heart-healthy fats.  Work with your health care provider or diet and nutrition specialist (dietitian) to adjust your eating plan to your individual calorie needs. This information is not intended to replace advice given to you by your health care provider. Make sure you discuss any questions you have with your health care provider. Document Released: 09/13/2011 Document Revised: 09/17/2016 Document Reviewed: 09/17/2016 Elsevier Interactive Patient Education  2019 [employer].

## 2019-02-19 ENCOUNTER — Other Ambulatory Visit: Payer: Self-pay | Admitting: Family Medicine

## 2019-02-19 DIAGNOSIS — E785 Hyperlipidemia, unspecified: Secondary | ICD-10-CM

## 2019-02-19 DIAGNOSIS — E1169 Type 2 diabetes mellitus with other specified complication: Secondary | ICD-10-CM

## 2019-02-20 NOTE — Chronic Care Management (AMB) (Signed)
  Care Management   Note  02/20/2019 Name: Meagan [name] MRN: [id number] DOB: July 14, 1951  68 y.o. year old female referred to Chronic Care Management by Dr. Steele Sizer for DM education and medication management. Per patient report to [place], "I just don't like taking medications". Last office visit with Steele Sizer, MD was 01/28/19.   Was unable to reach patient via telephone today and have left HIPAA compliant voicemail asking patient to return my call. (unsuccessful outreach #1).  Follow up within 7 days.   Ruben Reason, PharmD Clinical Pharmacist [facility] [phone]

## 2019-02-21 ENCOUNTER — Other Ambulatory Visit: Payer: Self-pay | Admitting: Family Medicine

## 2019-02-21 DIAGNOSIS — E1159 Type 2 diabetes mellitus with other circulatory complications: Secondary | ICD-10-CM

## 2019-02-21 DIAGNOSIS — I10 Essential (primary) hypertension: Secondary | ICD-10-CM

## 2019-03-03 ENCOUNTER — Ambulatory Visit: Payer: Self-pay

## 2019-03-06 ENCOUNTER — Ambulatory Visit: Payer: Self-pay | Admitting: Pharmacist

## 2019-03-06 ENCOUNTER — Ambulatory Visit: Payer: Self-pay

## 2019-03-06 ENCOUNTER — Other Ambulatory Visit: Payer: Self-pay | Admitting: Family Medicine

## 2019-03-06 DIAGNOSIS — E1169 Type 2 diabetes mellitus with other specified complication: Secondary | ICD-10-CM

## 2019-03-06 NOTE — Telephone Encounter (Signed)
Requesting 90 day supply.

## 2019-03-06 NOTE — Chronic Care Management (AMB) (Signed)
  Care Management   Note  03/06/2019 Name: Meagan [name] MRN: [id number] DOB: 1951-07-27  68 y.o. year old female referred to Chronic Care Management by Dr. Steele Sizer for diabetes education and medication managment. Chronic conditions include DM, HTN. Last office visit with Steele Sizer, MD was 01/28/19. Of note, patient is successfully engaged with RNCM [name]   Was unable to reach patient via telephone today and have left HIPAA compliant voicemail asking patient to return my call. (unsuccessful outreach #2).  Ruben Reason, PharmD Clinical Pharmacist [facility] [phone]

## 2019-03-08 ENCOUNTER — Other Ambulatory Visit: Payer: Self-pay | Admitting: Family Medicine

## 2019-03-08 DIAGNOSIS — I10 Essential (primary) hypertension: Secondary | ICD-10-CM

## 2019-03-08 DIAGNOSIS — E1159 Type 2 diabetes mellitus with other circulatory complications: Secondary | ICD-10-CM

## 2019-03-09 NOTE — Telephone Encounter (Signed)
lvm asking pt to come by the office to do a blood pressure check and to schedule future appt due to medication refill request from the pharmacy

## 2019-03-10 ENCOUNTER — Other Ambulatory Visit: Payer: Self-pay | Admitting: Family Medicine

## 2019-03-10 ENCOUNTER — Ambulatory Visit: Payer: Self-pay

## 2019-03-10 DIAGNOSIS — I152 Hypertension secondary to endocrine disorders: Secondary | ICD-10-CM

## 2019-03-10 DIAGNOSIS — E1159 Type 2 diabetes mellitus with other circulatory complications: Secondary | ICD-10-CM

## 2019-03-10 DIAGNOSIS — I10 Essential (primary) hypertension: Secondary | ICD-10-CM

## 2019-03-17 ENCOUNTER — Ambulatory Visit: Payer: Self-pay

## 2019-03-17 ENCOUNTER — Ambulatory Visit: Payer: Self-pay | Admitting: Pharmacist

## 2019-03-17 NOTE — Chronic Care Management (AMB) (Signed)
  Chronic Care Management   Note  03/17/2019 Name: Meagan [name] MRN: [id number] DOB: [birth year]  68 y.o. year old female referred to Chronic Care Management by Dr. Steele Sizer for diabetes education and medication management. Chronic conditions include DM, HTN. Last office visit with Steele Sizer, MD was 01/28/19. Of note, patient is engaged with [facility] [name]   Was unable to reach patient via telephone today and have left HIPAA compliant voicemail asking patient to return my call. (unsuccessful outreach #3).  Follow up plan: A HIPPA compliant phone message was left for the patient providing contact information and requesting a return call.  The patient has been provided with contact information for the care management team and has been advised to call with any health related questions or concerns.  The care management team is available to follow up with the patient after provider conversation with the patient regarding recommendation for care management engagement and subsequent re-referral to the care management team.   Ruben Reason, PharmD Clinical Pharmacist [facility] [phone]

## 2019-03-18 ENCOUNTER — Ambulatory Visit
Admission: RE | Admit: 2019-03-18 | Discharge: 2019-03-18 | Disposition: A | Discharge status: Home / Self Care | Payer: BC Managed Care – PPO | Source: Ambulatory Visit | Attending: Family Medicine | Admitting: Family Medicine

## 2019-03-18 ENCOUNTER — Other Ambulatory Visit: Payer: Self-pay

## 2019-03-18 DIAGNOSIS — Z1231 Encounter for screening mammogram for malignant neoplasm of breast: Secondary | ICD-10-CM | POA: Diagnosis not present

## 2019-04-23 ENCOUNTER — Other Ambulatory Visit: Payer: Self-pay | Admitting: Family Medicine

## 2019-04-23 DIAGNOSIS — IMO0002 Reserved for concepts with insufficient information to code with codable children: Secondary | ICD-10-CM

## 2019-04-23 DIAGNOSIS — E1140 Type 2 diabetes mellitus with diabetic neuropathy, unspecified: Secondary | ICD-10-CM

## 2019-04-23 DIAGNOSIS — E1169 Type 2 diabetes mellitus with other specified complication: Secondary | ICD-10-CM

## 2019-04-27 NOTE — Telephone Encounter (Signed)
lvm for pt to call back to schedule an appt

## 2019-04-28 ENCOUNTER — Ambulatory Visit: Payer: BC Managed Care – PPO | Admitting: Family Medicine

## 2019-04-28 ENCOUNTER — Ambulatory Visit: Payer: BC Managed Care – PPO

## 2019-05-27 ENCOUNTER — Encounter: Payer: Self-pay | Admitting: Family Medicine

## 2019-05-27 LAB — HM DIABETES EYE EXAM

## 2019-07-01 ENCOUNTER — Other Ambulatory Visit: Payer: Self-pay

## 2019-07-01 ENCOUNTER — Inpatient Hospital Stay (HOSPITAL_COMMUNITY)
Admission: EM | Admit: 2019-07-01 | Discharge: 2019-07-08 | DRG: 065 | Disposition: A | Discharge status: Skilled Nursing Facility | Payer: BC Managed Care – PPO | Source: Ambulatory Visit | Attending: Family Medicine | Admitting: Family Medicine

## 2019-07-01 ENCOUNTER — Emergency Department (HOSPITAL_COMMUNITY): Payer: BC Managed Care – PPO

## 2019-07-01 DIAGNOSIS — I6389 Other cerebral infarction: Secondary | ICD-10-CM | POA: Diagnosis not present

## 2019-07-01 DIAGNOSIS — Z7982 Long term (current) use of aspirin: Secondary | ICD-10-CM

## 2019-07-01 DIAGNOSIS — Z9114 Patient's other noncompliance with medication regimen: Secondary | ICD-10-CM | POA: Diagnosis not present

## 2019-07-01 DIAGNOSIS — Z79899 Other long term (current) drug therapy: Secondary | ICD-10-CM | POA: Diagnosis not present

## 2019-07-01 DIAGNOSIS — G4733 Obstructive sleep apnea (adult) (pediatric): Secondary | ICD-10-CM | POA: Diagnosis present

## 2019-07-01 DIAGNOSIS — H53462 Homonymous bilateral field defects, left side: Secondary | ICD-10-CM | POA: Diagnosis present

## 2019-07-01 DIAGNOSIS — Z821 Family history of blindness and visual loss: Secondary | ICD-10-CM

## 2019-07-01 DIAGNOSIS — D751 Secondary polycythemia: Secondary | ICD-10-CM | POA: Diagnosis present

## 2019-07-01 DIAGNOSIS — Z794 Long term (current) use of insulin: Secondary | ICD-10-CM | POA: Diagnosis not present

## 2019-07-01 DIAGNOSIS — I6521 Occlusion and stenosis of right carotid artery: Secondary | ICD-10-CM | POA: Diagnosis present

## 2019-07-01 DIAGNOSIS — D72829 Elevated white blood cell count, unspecified: Secondary | ICD-10-CM

## 2019-07-01 DIAGNOSIS — E669 Obesity, unspecified: Secondary | ICD-10-CM | POA: Diagnosis present

## 2019-07-01 DIAGNOSIS — E876 Hypokalemia: Secondary | ICD-10-CM | POA: Diagnosis not present

## 2019-07-01 DIAGNOSIS — R29705 NIHSS score 5: Secondary | ICD-10-CM | POA: Diagnosis present

## 2019-07-01 DIAGNOSIS — I63531 Cerebral infarction due to unspecified occlusion or stenosis of right posterior cerebral artery: Principal | ICD-10-CM | POA: Diagnosis present

## 2019-07-01 DIAGNOSIS — Z881 Allergy status to other antibiotic agents status: Secondary | ICD-10-CM

## 2019-07-01 DIAGNOSIS — IMO0002 Reserved for concepts with insufficient information to code with codable children: Secondary | ICD-10-CM | POA: Diagnosis present

## 2019-07-01 DIAGNOSIS — E785 Hyperlipidemia, unspecified: Secondary | ICD-10-CM | POA: Diagnosis present

## 2019-07-01 DIAGNOSIS — E1165 Type 2 diabetes mellitus with hyperglycemia: Secondary | ICD-10-CM | POA: Diagnosis present

## 2019-07-01 DIAGNOSIS — E1140 Type 2 diabetes mellitus with diabetic neuropathy, unspecified: Secondary | ICD-10-CM | POA: Diagnosis present

## 2019-07-01 DIAGNOSIS — Z8249 Family history of ischemic heart disease and other diseases of the circulatory system: Secondary | ICD-10-CM | POA: Diagnosis not present

## 2019-07-01 DIAGNOSIS — Z888 Allergy status to other drugs, medicaments and biological substances status: Secondary | ICD-10-CM

## 2019-07-01 DIAGNOSIS — G8191 Hemiplegia, unspecified affecting right dominant side: Secondary | ICD-10-CM | POA: Diagnosis present

## 2019-07-01 DIAGNOSIS — E860 Dehydration: Secondary | ICD-10-CM | POA: Diagnosis present

## 2019-07-01 DIAGNOSIS — I639 Cerebral infarction, unspecified: Secondary | ICD-10-CM | POA: Diagnosis present

## 2019-07-01 DIAGNOSIS — Z20828 Contact with and (suspected) exposure to other viral communicable diseases: Secondary | ICD-10-CM | POA: Diagnosis present

## 2019-07-01 DIAGNOSIS — Z6836 Body mass index (BMI) 36.0-36.9, adult: Secondary | ICD-10-CM

## 2019-07-01 DIAGNOSIS — I10 Essential (primary) hypertension: Secondary | ICD-10-CM | POA: Diagnosis present

## 2019-07-01 DIAGNOSIS — I6930 Unspecified sequelae of cerebral infarction: Secondary | ICD-10-CM | POA: Diagnosis present

## 2019-07-01 LAB — CBG MONITORING, ED: Glucose-Capillary: 327 mg/dL — ABNORMAL HIGH (ref 70–99)

## 2019-07-01 LAB — COMPREHENSIVE METABOLIC PANEL
ALT: 18 U/L (ref 0–44)
AST: 17 U/L (ref 15–41)
Albumin: 3.1 g/dL — ABNORMAL LOW (ref 3.5–5.0)
Alkaline Phosphatase: 126 U/L (ref 38–126)
Anion gap: 14 (ref 5–15)
BUN: 11 mg/dL (ref 8–23)
CO2: 25 mmol/L (ref 22–32)
Calcium: 9 mg/dL (ref 8.9–10.3)
Chloride: 93 mmol/L — ABNORMAL LOW (ref 98–111)
Creatinine, Ser: 0.82 mg/dL (ref 0.44–1.00)
GFR calc Af Amer: >60 mL/min (ref >60)
GFR calc non Af Amer: >60 mL/min (ref >60)
Glucose, Bld: 316 mg/dL — ABNORMAL HIGH (ref 70–99)
Potassium: 3.9 mmol/L (ref 3.5–5.1)
Sodium: 132 mmol/L — ABNORMAL LOW (ref 135–145)
Total Bilirubin: 0.7 mg/dL (ref 0.3–1.2)
Total Protein: 7.7 g/dL (ref 6.5–8.1)

## 2019-07-01 LAB — URINALYSIS, ROUTINE W REFLEX MICROSCOPIC
Bilirubin Urine: NEGATIVE
Glucose, UA: >=500 mg/dL — AB
Hgb urine dipstick: NEGATIVE
Ketones, ur: NEGATIVE mg/dL
Leukocytes,Ua: NEGATIVE
Nitrite: NEGATIVE
Protein, ur: 100 mg/dL — AB
Specific Gravity, Urine: 1.008 (ref 1.005–1.030)
pH: 6 (ref 5.0–8.0)

## 2019-07-01 LAB — CBC
HCT: 46.8 % — ABNORMAL HIGH (ref 36.0–46.0)
Hemoglobin: 16.5 g/dL — ABNORMAL HIGH (ref 12.0–15.0)
MCH: 30.6 pg (ref 26.0–34.0)
MCHC: 35.3 g/dL (ref 30.0–36.0)
MCV: 86.7 fL (ref 80.0–100.0)
Platelets: 297 10*3/uL (ref 150–400)
RBC: 5.4 MIL/uL — ABNORMAL HIGH (ref 3.87–5.11)
RDW: 12.1 % (ref 11.5–15.5)
WBC: 11.9 10*3/uL — ABNORMAL HIGH (ref 4.0–10.5)
nRBC: 0 % (ref 0.0–0.2)

## 2019-07-01 LAB — SEDIMENTATION RATE: Sed Rate: 55 mm/hr — ABNORMAL HIGH (ref 0–22)

## 2019-07-01 LAB — C-REACTIVE PROTEIN: CRP: 1.2 mg/dL — ABNORMAL HIGH (ref <1.0)

## 2019-07-01 LAB — LIPASE, BLOOD: Lipase: 33 U/L (ref 11–51)

## 2019-07-01 MED ORDER — SODIUM CHLORIDE 0.9% FLUSH
3.0000 mL | Freq: Once | INTRAVENOUS | Status: AC
  Administered 2019-07-01: 3 mL via INTRAVENOUS

## 2019-07-01 MED ORDER — INSULIN ASPART 100 UNIT/ML ~~LOC~~ SOLN
0.0000 [IU] | Freq: Every day | SUBCUTANEOUS | Status: DC
  Administered 2019-07-02: 2 [IU] via SUBCUTANEOUS
  Administered 2019-07-02: 4 [IU] via SUBCUTANEOUS
  Administered 2019-07-03: 21:00:00 3 [IU] via SUBCUTANEOUS
  Administered 2019-07-04: 02:00:00 2 [IU] via SUBCUTANEOUS

## 2019-07-01 MED ORDER — SODIUM CHLORIDE 0.9 % IV BOLUS
1000.0000 mL | Freq: Once | INTRAVENOUS | Status: AC
  Administered 2019-07-01: 1000 mL via INTRAVENOUS

## 2019-07-01 MED ORDER — INSULIN ASPART 100 UNIT/ML ~~LOC~~ SOLN
0.0000 [IU] | Freq: Three times a day (TID) | SUBCUTANEOUS | Status: DC
  Administered 2019-07-02: 19:00:00 5 [IU] via SUBCUTANEOUS
  Administered 2019-07-02: 13:00:00 3 [IU] via SUBCUTANEOUS
  Administered 2019-07-02: 5 [IU] via SUBCUTANEOUS
  Administered 2019-07-03 (×2): 3 [IU] via SUBCUTANEOUS
  Administered 2019-07-03: 2 [IU] via SUBCUTANEOUS
  Administered 2019-07-04: 12:00:00 5 [IU] via SUBCUTANEOUS
  Administered 2019-07-04 – 2019-07-05 (×3): 2 [IU] via SUBCUTANEOUS
  Administered 2019-07-05: 13:00:00 3 [IU] via SUBCUTANEOUS
  Administered 2019-07-05: 1 [IU] via SUBCUTANEOUS
  Administered 2019-07-06 (×2): 5 [IU] via SUBCUTANEOUS
  Administered 2019-07-07: 16:00:00 1 [IU] via SUBCUTANEOUS
  Administered 2019-07-07: 3 [IU] via SUBCUTANEOUS
  Administered 2019-07-07: 07:00:00 1 [IU] via SUBCUTANEOUS
  Administered 2019-07-08: 3 [IU] via SUBCUTANEOUS
  Administered 2019-07-08: 2 [IU] via SUBCUTANEOUS

## 2019-07-01 MED ORDER — ENOXAPARIN SODIUM 40 MG/0.4ML ~~LOC~~ SOLN
40.0000 mg | SUBCUTANEOUS | Status: DC
  Administered 2019-07-02 – 2019-07-07 (×7): 40 mg via SUBCUTANEOUS
  Filled 2019-07-01 (×8): qty 0.4

## 2019-07-01 MED ORDER — ATORVASTATIN CALCIUM 80 MG PO TABS
80.0000 mg | ORAL_TABLET | Freq: Every day | ORAL | Status: DC
  Administered 2019-07-01 – 2019-07-08 (×8): 80 mg via ORAL
  Filled 2019-07-01 (×8): qty 1

## 2019-07-01 MED ORDER — AMLODIPINE BESYLATE 5 MG PO TABS
10.0000 mg | ORAL_TABLET | Freq: Once | ORAL | Status: AC
  Administered 2019-07-01: 10 mg via ORAL
  Filled 2019-07-01: qty 2

## 2019-07-01 MED ORDER — STROKE: EARLY STAGES OF RECOVERY BOOK
Freq: Once | Status: AC
  Administered 2019-07-02

## 2019-07-01 MED ORDER — LORATADINE 10 MG PO TABS: 10.0000 mg | ORAL_TABLET | ORAL | Status: DC | PRN

## 2019-07-01 MED ORDER — ASPIRIN 325 MG PO TABS
325.0000 mg | ORAL_TABLET | Freq: Every day | ORAL | Status: DC
  Administered 2019-07-01 – 2019-07-08 (×8): 325 mg via ORAL
  Filled 2019-07-01 (×8): qty 1

## 2019-07-01 MED ORDER — LORAZEPAM 1 MG PO TABS
1.0000 mg | ORAL_TABLET | Freq: Once | ORAL | Status: AC
  Administered 2019-07-01: 1 mg via ORAL
  Filled 2019-07-01: qty 1

## 2019-07-01 MED ORDER — ONDANSETRON 4 MG PO TBDP
4.0000 mg | ORAL_TABLET | Freq: Once | ORAL | Status: AC | PRN
  Administered 2019-07-01: 4 mg via ORAL
  Filled 2019-07-01: qty 1

## 2019-07-01 MED ORDER — ACETAMINOPHEN 160 MG/5ML PO SOLN: 650.0000 mg | ORAL | Status: DC | PRN

## 2019-07-01 MED ORDER — CLOPIDOGREL BISULFATE 75 MG PO TABS
75.0000 mg | ORAL_TABLET | Freq: Every day | ORAL | Status: DC
  Administered 2019-07-01 – 2019-07-08 (×8): 75 mg via ORAL
  Filled 2019-07-01 (×8): qty 1

## 2019-07-01 MED ORDER — ACETAMINOPHEN 650 MG RE SUPP: 650.0000 mg | RECTAL | Status: DC | PRN

## 2019-07-01 MED ORDER — ACETAMINOPHEN 325 MG PO TABS
650.0000 mg | ORAL_TABLET | ORAL | Status: DC | PRN
  Administered 2019-07-02 – 2019-07-03 (×5): 650 mg via ORAL
  Filled 2019-07-01 (×6): qty 2

## 2019-07-01 NOTE — ED Notes (Signed)
Boyce Medici- daughter, would like to be updated on status of pt. [phone]

## 2019-07-01 NOTE — ED Provider Notes (Signed)
Patient assumed in signout.  Briefly 68 year old female presents with chief complaint of hypertension as well as right-sided head and neck pain and visual field loss in left peripheral vision.  Work-up started by previous provider includes labs, MRI brain, MR angiogram neck and head.  Labs remarkable for dehydration, nonspecific leukocytosis of 11.9.  Patient has not taken her home blood pressure medication today, she was given dose as well as Ativan for anxiety.  Imaging is pending.  We will follow-up and dispose accordingly.  If negative will treat for migraine.  Physical Exam  BP (!) 156/114   Pulse 83   Temp 98.7 F (37.1 C)   Resp 10   SpO2 96%   Physical Exam  PE: Constitutional: well-developed, well-nourished, no apparent distress HENT: normocephalic, atraumatic. no cervical adenopathy Cardiovascular: normal rate and rhythm, distal pulses intact Pulmonary/Chest: effort normal; breath sounds clear and equal bilaterally; no wheezes or rales Abdominal: soft and nontender Musculoskeletal: full ROM, no edema Neurological: alert with goal directed thinking Skin: warm and dry, no rash, no diaphoresis Psychiatric: normal mood and affect, normal behavior    ED Course/Procedures   MRI HEAD WITHOUT CONTRAST    MRA HEAD WITHOUT CONTRAST    MRA NECK WITHOUT CONTRAST    TECHNIQUE:  Multiplanar, multiecho pulse sequences of the brain and surrounding  structures were obtained without intravenous contrast. Angiographic  images of the Circle of Willis were obtained using MRA technique  without intravenous contrast. Angiographic images of the neck were  obtained using MRA technique without intravenous contrast. Carotid  stenosis measurements (when applicable) are obtained utilizing  NASCET criteria, using the distal internal carotid diameter as the  denominator.    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 Mild swelling but no visible  hemorrhage. No other acute finding. There chronic small-vessel  ischemic changes of the pons. No focal cerebellar insult. Cerebral  hemispheres show moderate chronic small-vessel ischemic changes  throughout the deep and subcortical hemispheric white matter. No  mass lesion, hydrocephalus or extra-axial collection.    Vascular: Major vessels at the base of the brain show flow.    Skull and upper cervical spine: Negative    Sinuses/Orbits: Clear/normal    Other: None    MRA HEAD FINDINGS    Both internal carotid arteries are widely patent into the brain. No  siphon stenosis. The anterior and middle cerebral vessels are patent  without proximal stenosis, aneurysm or vascular malformation.    Both vertebral arteries are widely patent to the basilar. No basilar  stenosis. Posterior circulation branch vessels show flow. There is  atherosclerotic narrowing and irregularity of the PCA branches, with  a focal stenosis at the right P1 P2 junction.    MRA NECK FINDINGS    The noncontrast exam is technically deficient but the patient could  not tolerate repeat. It can be stated that there is antegrade flow  in both vertebral arteries with the left being dominant. Both  vertebral arteries reach the basilar. No basilar stenosis is seen of  the proximal vessel. Little information can be obtained regarding  the carotid arteries in the neck. Both common carotid arteries do  show antegrade flow and both internal carotid arteries do appear to  be patent.    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 No evidence of  hemorrhage.    Moderate chronic small-vessel ischemic changes elsewhere affecting  the pons in the cerebral hemispheric white matter.     Intracranial MR angiography does not show any large or medium vessel  occlusion. There is distal vessel atherosclerotic irregularity in  both posterior cerebral artery branches, including a focal stenosis  at the right P1 P2 junction.    No significant neck vessel disease suspected, but detail is quite  limited due to technical deficiency of the noncontrast neck MRA. The  patient would not allow repeat imaging.      Electronically Signed  By: Nelson Chimes M.D.  On: 07/01/2019 18:27     MDM   Pt received in sign out from C. Lawyer PA-C pending MRI.  On exam she has equal grip strength in bilateral upper extremities.  She has visual field loss in left peripheral vision. MRI head is concerning for acute infarction of the right parieto-occipital junction affecting region of the brain without evidence of hemorrhage. Findings and plan of care discussed with supervising physician Dr. Tyrone Nine who agrees with plan to admit.  Spoke with Dr. Rory Percy  with neurology who recommends medical admission for stroke work-up.  Case discussed with hospitalist Dr. Marlowe Sax who agrees to assume care of the patient and bring into the hospital for further evaluation and management.       Flint Melter 07/01/19 2133    Deno Etienne, DO 07/01/19 2222

## 2019-07-01 NOTE — ED Notes (Signed)
Pt ambulated to the restroom and back. Pt just needed guidance with which direction to walk due to visual problems.

## 2019-07-01 NOTE — H&P (Addendum)
History and Physical    Meagan [name] UVO:[id number] DOB: Oct 29, 1950 DOA: 07/01/2019  PCP: Steele Sizer, MD Patient coming from: Home  Chief Complaint: High blood pressure, headache  HPI: Meagan [name] is a 68 y.o. female with medical history significant of type 2 diabetes, hypertension, hyperlipidemia, carotid artery stenosis presenting to the hospital for evaluation of high blood pressure and headache.  Patient states her blood pressure has been high recently.  She has not been taking her home blood pressure and diabetes medications as prescribed.  Reports having right-sided and occipital headaches for the past few days.  States she was seen by an eye specialist who told her that the pressure in her eyes was high and he had given her an injection.  She continued to have headaches and about 3 days ago noticed that she had lost her peripheral vision.  She is not able to see anything that is to her left.  Also a few days ago she noticed that her right arm was a little weak when she tried to pick up an object.  Denies history of prior stroke.  States she has never smoked cigarettes.  ED Course: Systolic in the 742V to 956L.  White count 11.9.  Hemoglobin 16.5, previously in the 14 range.  ESR 55, CRP 1.2.  SARS-CoV-2 test pending.  Brain MRI showing acute infarction at the right parietal occipital junction affecting the region of the brain measuring about 5 x 2 x 3 cm.  No evidence of hemorrhage.  MRA head and neck without evidence of large vessel occlusion.  Neurology consulted by ED provider.  Patient received amlodipine 10 mg, Ativan 1 mg, and 1 L normal saline bolus.  Review of Systems:  All systems reviewed and apart from history of presenting illness, are negative.  Past Medical History:  Diagnosis Date   Cellulitis    left leg   Diabetes mellitus without complication ([place])    non-insulin dependent   Diffuse cystic mastopathy    Fall 2013   Hypertension 2004   Occlusion and  stenosis of carotid artery without mention of cerebral infarction    Sleep apnea     Past Surgical History:  Procedure Laterality Date   APPENDECTOMY  2000s   BIOPSY THYROID  2006   [facility]   BREAST EXCISIONAL BIOPSY Left 2000s   neg   CARPAL TUNNEL RELEASE Left March 2015   COLONOSCOPY  2011   DR.WOH[facility]   COLONOSCOPY WITH PROPOFOL N/A 07/11/2017   Procedure: COLONOSCOPY WITH PROPOFOL;  Surgeon: Jonathon Bellows, MD;  Location: [facility] ENDOSCOPY;  Service: Gastroenterology;  Laterality: N/A;   ganglion cyst removal      KNEE SURGERY Left 08/2009   KNEE SURGERY Right 04/07/2010   ROTATOR CUFF REPAIR Right 2008   TUBAL LIGATION       reports that she has never smoked. She has never used smokeless tobacco. She reports that she does not drink alcohol or use drugs.  Allergies  Allergen Reactions   Keflex [Cephalexin] Rash    Family History  Problem Relation Age of Onset   Breast cancer Maternal Aunt    Hypertension Mother    Kidney Stones Mother    Hypertension Father    Congestive Heart Failure Father    Hypertension Paternal Grandfather    Diabetes Sister    Vision loss Sister    Varicose Veins Sister    Hypertension Sister    Breast cancer Sister 41       Double Mastectomy  Hypertension Sister     Prior to Admission medications   Medication Sig Start Date End Date Taking? Authorizing Provider  ibuprofen (ADVIL) 200 MG tablet Take 400 mg by mouth every 6 (six) hours as needed for headache or mild pain.   Yes [provider]  metFORMIN (GLUCOPHAGE-XR) 750 MG 24 hr tablet Take 2 tablets (1,500 mg total) by mouth daily with breakfast. Patient taking differently: Take 750 mg by mouth 2 (two) times daily.  01/28/19  Yes Sowles, Drue Stager, MD  olmesartan-hydrochlorothiazide (BENICAR HCT) 40-25 MG tablet Take 1 tablet by mouth daily. 01/28/19  Yes Sowles, Drue Stager, MD  amLODipine (NORVASC) 5 MG tablet TAKE 1-2 TABLETS (5-10 MG TOTAL) BY MOUTH DAILY. 03/10/19    Steele Sizer, MD  aspirin 81 MG tablet Take 81 mg by mouth daily.    [provider]  dapagliflozin propanediol (FARXIGA) 10 MG TABS tablet Take 10 mg by mouth daily. 01/28/19   Steele Sizer, MD  diphenhydrAMINE (BENADRYL) 12.5 MG/5ML elixir Take 10 mLs (25 mg total) by mouth at bedtime. Patient not taking: Reported on 02/12/2019 10/28/18   Fredderick Severance, NP  fluconazole (DIFLUCAN) 150 MG tablet Take 1 tablet (150 mg total) by mouth every other day. Patient not taking: Reported on 02/12/2019 01/28/19   Steele Sizer, MD  fluticasone [facility]) 50 MCG/ACT nasal spray Place 2 sprays into both nostrils daily. Patient not taking: Reported on 02/12/2019 10/22/18   Steele Sizer, MD  glucose blood (ONE TOUCH ULTRA TEST) test strip USE AS DIRECTED TWICE A DAY Patient not taking: Reported on 02/12/2019 10/22/18   Steele Sizer, MD  insulin degludec (TRESIBA FLEXTOUCH) 100 UNIT/ML SOPN FlexTouch Pen Inject 0.2-0.5 mLs (20-50 Units total) into the skin daily. 01/28/19   Steele Sizer, MD  levocetirizine (XYZAL) 5 MG tablet Take 5 mg by mouth as needed for allergies.    [provider]  OZEMPIC, 1 MG/DOSE, 2 MG/1.5ML SOPN INJECT 1 MG INTO THE SKIN ONCE A WEEK. 03/06/19   Ancil Boozer, Drue Stager, MD  rosuvastatin (CRESTOR) 20 MG tablet Take 1 tablet (20 mg total) by mouth daily. Patient not taking: Reported on 02/12/2019 10/22/18   Steele Sizer, MD    Physical Exam: Vitals:   07/01/19 1400 07/01/19 1430 07/01/19 1530 07/01/19 1900  BP: (!) 154/98 (!) 156/114 (!) 169/106 (!) 145/84  Pulse: 78 83 82 93  Resp: _0 Temp:      SpO2: 97% 96% 98% 95%    Physical Exam  Constitutional: She is oriented to person, place, and time. She appears well-developed and well-nourished. No distress.  HENT:  Head: Normocephalic.  Mouth/Throat: Oropharynx is clear and moist.  Eyes: Right eye exhibits no discharge. Left eye exhibits no discharge.  Neck: Neck supple.  Cardiovascular: Normal  rate, regular rhythm and intact distal pulses.  Pulmonary/Chest: Effort normal and breath sounds normal. No respiratory distress. She has no wheezes. She has no rales.  Abdominal: Soft. Bowel sounds are normal. She exhibits no distension. There is no abdominal tenderness.  Musculoskeletal:        General: No edema.  Neurological: She is alert and oriented to person, place, and time.  Left homonymous hemianopia Photophobia Speech fluent, tongue midline, no facial droop Strength 5 out of 5 in bilateral upper and lower extremities. Sensation to light touch intact throughout.  Skin: Skin is warm and dry. She is not diaphoretic.     Labs on Admission: I have personally reviewed following labs and imaging studies  CBC:  Recent Labs  Lab 07/01/19 1204  WBC 11.9*  HGB 16.5*  HCT 46.8*  MCV 86.7  PLT 662   Basic Metabolic Panel: Recent Labs  Lab 07/01/19 1204  NA 132*  K 3.9  CL 93*  CO2 25  GLUCOSE 316*  BUN 11  CREATININE 0.82  CALCIUM 9.0   GFR: CrCl cannot be calculated (Unknown ideal weight.). Liver Function Tests: Recent Labs  Lab 07/01/19 1204  AST 17  ALT 18  ALKPHOS 126  BILITOT 0.7  PROT 7.7  ALBUMIN 3.1*   Recent Labs  Lab 07/01/19 1204  LIPASE 33   No results for input(s): AMMONIA in the last 168 hours. Coagulation Profile: No results for input(s): INR, PROTIME in the last 168 hours. Cardiac Enzymes: No results for input(s): CKTOTAL, CKMB, CKMBINDEX, TROPONINI in the last 168 hours. BNP (last 3 results) No results for input(s): PROBNP in the last 8760 hours. HbA1C: No results for input(s): HGBA1C in the last 72 hours. CBG: No results for input(s): GLUCAP in the last 168 hours. Lipid Profile: No results for input(s): CHOL, HDL, LDLCALC, TRIG, CHOLHDL, LDLDIRECT in the last 72 hours. Thyroid Function Tests: No results for input(s): TSH, T4TOTAL, FREET4, T3FREE, THYROIDAB in the last 72 hours. Anemia Panel: No results for input(s): VITAMINB12,  FOLATE, FERRITIN, TIBC, IRON, RETICCTPCT in the last 72 hours. Urine analysis:    Component Value Date/Time   COLORURINE YELLOW 07/01/2019 1508   APPEARANCEUR CLEAR 07/01/2019 1508   LABSPEC 1.008 07/01/2019 1508   PHURINE 6.0 07/01/2019 1508   GLUCOSEU >=500 (A) 07/01/2019 1508   HGBUR NEGATIVE 07/01/2019 1508   BILIRUBINUR NEGATIVE 07/01/2019 1508   KETONESUR NEGATIVE 07/01/2019 1508   PROTEINUR 100 (A) 07/01/2019 1508   UROBILINOGEN 0.2 04/21/2009 1144   NITRITE NEGATIVE 07/01/2019 1508   LEUKOCYTESUR NEGATIVE 07/01/2019 1508    Radiological Exams on Admission: Mr Angio Head Wo Contrast  Result Date: 07/01/2019 CLINICAL DATA:  Acute severe headache. Arterial dissection suspected. Possible temporal arteritis. EXAM: MRI HEAD WITHOUT CONTRAST MRA HEAD WITHOUT CONTRAST MRA NECK WITHOUT CONTRAST TECHNIQUE: Multiplanar, multiecho pulse sequences of the brain and surrounding structures were obtained without intravenous contrast. Angiographic images of the Circle of Willis were obtained using MRA technique without intravenous contrast. Angiographic images of the neck were obtained using MRA technique without intravenous contrast. Carotid stenosis measurements (when applicable) are obtained utilizing NASCET criteria, using the distal internal carotid diameter as the denominator. COMPARISON:  12/19/2006 FINDINGS: MRI HEAD FINDINGS Brain: Diffusion imaging shows acute infarction at the parieto-occipital junction region on the right affecting an area measuring approximately 5 x 2 x 3 cm. Mild swelling but no visible hemorrhage. No other acute finding. There chronic small-vessel ischemic changes of the pons. No focal cerebellar insult. Cerebral hemispheres show moderate chronic small-vessel ischemic changes throughout the deep and subcortical hemispheric white matter. No mass lesion, hydrocephalus or extra-axial collection. Vascular: Major vessels at the base of the brain show flow. Skull and upper  cervical spine: Negative Sinuses/Orbits: Clear/normal Other: None MRA HEAD FINDINGS Both internal carotid arteries are widely patent into the brain. No siphon stenosis. The anterior and middle cerebral vessels are patent without proximal stenosis, aneurysm or vascular malformation. Both vertebral arteries are widely patent to the basilar. No basilar stenosis. Posterior circulation branch vessels show flow. There is atherosclerotic narrowing and irregularity of the PCA branches, with a focal stenosis at the right P1 P2 junction. MRA NECK FINDINGS The noncontrast exam is technically deficient but the patient could not  tolerate repeat. It can be stated that there is antegrade flow in both vertebral arteries with the left being dominant. Both vertebral arteries reach the basilar. No basilar stenosis is seen of the proximal vessel. Little information can be obtained regarding the carotid arteries in the neck. Both common carotid arteries do show antegrade flow and both internal carotid arteries do appear to be patent. IMPRESSION: Acute infarction at the right parieto-occipital junction affecting a region of brain measuring about 5 x 2 x 3 cm. No evidence of hemorrhage. Moderate chronic small-vessel ischemic changes elsewhere affecting the pons in the cerebral hemispheric white matter. Intracranial MR angiography does not show any large or medium vessel occlusion. There is distal vessel atherosclerotic irregularity in both posterior cerebral artery branches, including a focal stenosis at the right P1 P2 junction. No significant neck vessel disease suspected, but detail is quite limited due to technical deficiency of the noncontrast neck MRA. The patient would not allow repeat imaging. Electronically Signed   By: Nelson Chimes M.D.   On: 07/01/2019 18:27   Mr Angio Neck Wo Contrast  Result Date: 07/01/2019 CLINICAL DATA:  Acute severe headache. Arterial dissection suspected. Possible temporal arteritis. EXAM: MRI HEAD  WITHOUT CONTRAST MRA HEAD WITHOUT CONTRAST MRA NECK WITHOUT CONTRAST TECHNIQUE: Multiplanar, multiecho pulse sequences of the brain and surrounding structures were obtained without intravenous contrast. Angiographic images of the Circle of Willis were obtained using MRA technique without intravenous contrast. Angiographic images of the neck were obtained using MRA technique without intravenous contrast. Carotid stenosis measurements (when applicable) are obtained utilizing NASCET criteria, using the distal internal carotid diameter as the denominator. COMPARISON:  12/19/2006 FINDINGS: MRI HEAD FINDINGS Brain: Diffusion imaging shows acute infarction at the parieto-occipital junction region on the right affecting an area measuring approximately 5 x 2 x 3 cm. Mild swelling but no visible hemorrhage. No other acute finding. There chronic small-vessel ischemic changes of the pons. No focal cerebellar insult. Cerebral hemispheres show moderate chronic small-vessel ischemic changes throughout the deep and subcortical hemispheric white matter. No mass lesion, hydrocephalus or extra-axial collection. Vascular: Major vessels at the base of the brain show flow. Skull and upper cervical spine: Negative Sinuses/Orbits: Clear/normal Other: None MRA HEAD FINDINGS Both internal carotid arteries are widely patent into the brain. No siphon stenosis. The anterior and middle cerebral vessels are patent without proximal stenosis, aneurysm or vascular malformation. Both vertebral arteries are widely patent to the basilar. No basilar stenosis. Posterior circulation branch vessels show flow. There is atherosclerotic narrowing and irregularity of the PCA branches, with a focal stenosis at the right P1 P2 junction. MRA NECK FINDINGS The noncontrast exam is technically deficient but the patient could not tolerate repeat. It can be stated that there is antegrade flow in both vertebral arteries with the left being dominant. Both vertebral  arteries reach the basilar. No basilar stenosis is seen of the proximal vessel. Little information can be obtained regarding the carotid arteries in the neck. Both common carotid arteries do show antegrade flow and both internal carotid arteries do appear to be patent. IMPRESSION: Acute infarction at the right parieto-occipital junction affecting a region of brain measuring about 5 x 2 x 3 cm. No evidence of hemorrhage. Moderate chronic small-vessel ischemic changes elsewhere affecting the pons in the cerebral hemispheric white matter. Intracranial MR angiography does not show any large or medium vessel occlusion. There is distal vessel atherosclerotic irregularity in both posterior cerebral artery branches, including a focal stenosis at the right P1 P2  junction. No significant neck vessel disease suspected, but detail is quite limited due to technical deficiency of the noncontrast neck MRA. The patient would not allow repeat imaging. Electronically Signed   By: Nelson Chimes M.D.   On: 07/01/2019 18:27   Mr Brain Wo Contrast (neuro Protocol)  Result Date: 07/01/2019 CLINICAL DATA:  Acute severe headache. Arterial dissection suspected. Possible temporal arteritis. EXAM: MRI HEAD WITHOUT CONTRAST MRA HEAD WITHOUT CONTRAST MRA NECK WITHOUT CONTRAST TECHNIQUE: Multiplanar, multiecho pulse sequences of the brain and surrounding structures were obtained without intravenous contrast. Angiographic images of the Circle of Willis were obtained using MRA technique without intravenous contrast. Angiographic images of the neck were obtained using MRA technique without intravenous contrast. Carotid stenosis measurements (when applicable) are obtained utilizing NASCET criteria, using the distal internal carotid diameter as the denominator. COMPARISON:  12/19/2006 FINDINGS: MRI HEAD FINDINGS Brain: Diffusion imaging shows acute infarction at the parieto-occipital junction region on the right affecting an area measuring  approximately 5 x 2 x 3 cm. Mild swelling but no visible hemorrhage. No other acute finding. There chronic small-vessel ischemic changes of the pons. No focal cerebellar insult. Cerebral hemispheres show moderate chronic small-vessel ischemic changes throughout the deep and subcortical hemispheric white matter. No mass lesion, hydrocephalus or extra-axial collection. Vascular: Major vessels at the base of the brain show flow. Skull and upper cervical spine: Negative Sinuses/Orbits: Clear/normal Other: None MRA HEAD FINDINGS Both internal carotid arteries are widely patent into the brain. No siphon stenosis. The anterior and middle cerebral vessels are patent without proximal stenosis, aneurysm or vascular malformation. Both vertebral arteries are widely patent to the basilar. No basilar stenosis. Posterior circulation branch vessels show flow. There is atherosclerotic narrowing and irregularity of the PCA branches, with a focal stenosis at the right P1 P2 junction. MRA NECK FINDINGS The noncontrast exam is technically deficient but the patient could not tolerate repeat. It can be stated that there is antegrade flow in both vertebral arteries with the left being dominant. Both vertebral arteries reach the basilar. No basilar stenosis is seen of the proximal vessel. Little information can be obtained regarding the carotid arteries in the neck. Both common carotid arteries do show antegrade flow and both internal carotid arteries do appear to be patent. IMPRESSION: Acute infarction at the right parieto-occipital junction affecting a region of brain measuring about 5 x 2 x 3 cm. No evidence of hemorrhage. Moderate chronic small-vessel ischemic changes elsewhere affecting the pons in the cerebral hemispheric white matter. Intracranial MR angiography does not show any large or medium vessel occlusion. There is distal vessel atherosclerotic irregularity in both posterior cerebral artery branches, including a focal stenosis  at the right P1 P2 junction. No significant neck vessel disease suspected, but detail is quite limited due to technical deficiency of the noncontrast neck MRA. The patient would not allow repeat imaging. Electronically Signed   By: Nelson Chimes M.D.   On: 07/01/2019 18:27    EKG: Independently reviewed.  Sinus rhythm, nonspecific T wave abnormality.  Assessment/Plan Principal Problem:   Acute CVA (cerebrovascular accident) [facility]) Active Problems:   Hypertension, benign   Type 2 diabetes, uncontrolled, with neuropathy ([place])   Leukocytosis   Polycythemia   Acute CVA Patient reports having right-sided and occipital headaches for the past few days and loss of peripheral vision for the past 3 days.  Noted to have left homonymous hemianopia.  Brain MRI showing acute infarction at the right parietal occipital junction affecting the region of the  brain measuring about 5 x 2 x 3 cm.  No evidence of hemorrhage.  MRA head and neck without evidence of large vessel occlusion.  -Telemetry monitoring -2D echocardiogram -Hemoglobin A1c, fasting lipid panel -Aspirin 325 p.o. now and daily -Atorvastatin 80 mg now and daily -Frequent neurochecks -PT, OT, speech therapy. -N.p.o. until cleared by bedside swallow evaluation or formal speech evaluation -Neurology consulted, appreciate recommendations  Addendum: Neurology recommending DAPT with aspirin 325 mg daily and Plavix 75 mg daily.  Recommending allowing permissive hypertension for the first 24 to 48 hours, treat only if SBP greater than 220.  Blood pressures can be gradually normalized to SBP less than 140 upon discharge.  Hypertension Patient received amlodipine 10 mg in the ED.  Systolic currently in the 140s. -Blood pressure parameters per neurology recommendation  Hyperlipidemia -Check lipid panel -High intensity statin as above  Mild leukocytosis Likely reactive. White count 11.9.  UA not suggestive of infection.  Lungs clear on exam and not  endorsing any symptoms of pneumonia. -Repeat CBC in a.m.  Insulin-dependent diabetes mellitus -Check A1c.  Sliding scale insulin sensitive with bedtime coverage and CBG checks.  Polycythemia Hemoglobin 16.5, previously in the 14 range.  Patient denies history of smoking cigarettes. -Repeat CBC in a.m. to confirm  DVT prophylaxis: Lovenox Code Status: Full code Family Communication: Sister at bedside. Disposition Plan: Anticipate discharge after clinical improvement. Consults called: Neurology (Dr. Rory Percy) Admission status: It is my clinical opinion that admission to INPATIENT is reasonable and necessary in this 68 y.o. female  presenting with symptoms of headaches and loss of peripheral vision.  MRI with evidence of acute stroke.  Needs stroke work-up.  Neurology following.  Given the aforementioned, the predictability of an adverse outcome is felt to be significant. I expect that the patient will require at least 2 midnights in the hospital to treat this condition.   The medical decision making on this patient was of high complexity and the patient is at high risk for clinical deterioration, therefore this is a level 3 visit.  Shela Leff MD [facility] Pager [phone]  If 7PM-7AM, please contact night-coverage [email or web address] Password [email or web address]  07/01/2019, 8:11 PM

## 2019-07-01 NOTE — ED Provider Notes (Signed)
[place] EMERGENCY DEPARTMENT Provider Note   CSN: [id number] Arrival date & time: 07/01/19  1147     History   Chief Complaint Chief Complaint  Patient presents with  . Hypertension    HPI Meagan [name] is a 68 y.o. female.     HPI Patient presents to the emergency department with headache that is been ongoing since Sunday.  Along with right-sided head and neck pain.  Patient she also has a visual field loss in the left peripheral vision.  Patient states she had a procedure done to her retina last Thursday.  She states her symptoms started after that.  Patient states she is also had nausea as well.  The patient denies chest pain, shortness of breath, headache,blurred vision, neck pain, fever, cough, weakness, numbness, dizziness, anorexia, edema, abdominal pain, nausea, vomiting, diarrhea, rash, back pain, dysuria, hematemesis, bloody stool, near syncope, or syncope.  Patient states that light does make her headache worse. Past Medical History:  Diagnosis Date  . Cellulitis    left leg  . 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 Diffuse cystic mastopathy   . Fall 2013  . Hypertension 2004  . Occlusion and stenosis of carotid artery without mention of cerebral infarction   . Sleep apnea     Patient Active Problem List   Diagnosis Date Noted  . Morbid obesity ([place]) 10/22/2018  . Non compliance with medical treatment 03/26/2018  . Dyslipidemia associated with type 2 diabetes mellitus ([place]) 12/04/2016  . Uncontrolled type 2 diabetes mellitus with microalbuminuria, without long-term current use of insulin ([place]) 12/04/2016  . Seasonal allergies 02/01/2016  . OSA (obstructive sleep apnea) 10/06/2015  . Hypertensive retinopathy 10/06/2015  . Thyroid cyst 10/06/2015  . Dyslipidemia 10/06/2015  . Type 2 diabetes, uncontrolled, with neuropathy ([place]) 10/06/2015  . History of pneumonia 10/06/2015  . Chronic neck pain 10/06/2015   . Occlusion and stenosis of carotid artery without mention of cerebral infarction   . Hypertension, benign     Past Surgical History:  Procedure Laterality Date  . APPENDECTOMY  2000s  . BIOPSY THYROID  2006   [facility]  . BREAST EXCISIONAL BIOPSY Left 2000s   neg  . CARPAL TUNNEL RELEASE Left March 2015  . COLONOSCOPY  2011   DR.WOHL  . COLONOSCOPY WITH PROPOFOL N/A 07/11/2017   Procedure: COLONOSCOPY WITH PROPOFOL;  Surgeon: Jonathon Bellows, MD;  Location: [facility] ENDOSCOPY;  Service: Gastroenterology;  Laterality: N/A;  . ganglion cyst removal     . KNEE SURGERY Left 08/2009  . KNEE SURGERY Right 04/07/2010  . ROTATOR CUFF REPAIR Right 2008  . TUBAL LIGATION       OB History    Gravida  4   Para  4   Term      Preterm      AB      Living  4     SAB      TAB      Ectopic      Multiple      Live Births           Obstetric Comments  First pregnancy 39 First menstrual 13         Home Medications    Prior to Admission medications   Medication Sig Start Date End Date Taking? Authorizing Provider  ibuprofen (ADVIL) 200 MG tablet Take 400 mg by mouth every 6 (six) hours as needed for headache or mild pain.  Yes [provider]  metFORMIN (GLUCOPHAGE-XR) 750 MG 24 hr tablet Take 2 tablets (1,500 mg total) by mouth daily with breakfast. Patient taking differently: Take 750 mg by mouth 2 (two) times daily.  01/28/19  Yes Sowles, Drue Stager, MD  olmesartan-hydrochlorothiazide (BENICAR HCT) 40-25 MG tablet Take 1 tablet by mouth daily. 01/28/19  Yes Sowles, Drue Stager, MD  amLODipine (NORVASC) 5 MG tablet TAKE 1-2 TABLETS (5-10 MG TOTAL) BY MOUTH DAILY. 03/10/19   Steele Sizer, MD  aspirin 81 MG tablet Take 81 mg by mouth daily.    [provider]  dapagliflozin propanediol (FARXIGA) 10 MG TABS tablet Take 10 mg by mouth daily. 01/28/19   Steele Sizer, MD  diphenhydrAMINE (BENADRYL) 12.5 MG/5ML elixir Take 10 mLs (25 mg total) by mouth at bedtime. Patient  not taking: Reported on 02/12/2019 10/28/18   Fredderick Severance, NP  fluconazole (DIFLUCAN) 150 MG tablet Take 1 tablet (150 mg total) by mouth every other day. Patient not taking: Reported on 02/12/2019 01/28/19   Steele Sizer, MD  fluticasone [facility]) 50 MCG/ACT nasal spray Place 2 sprays into both nostrils daily. Patient not taking: Reported on 02/12/2019 10/22/18   Steele Sizer, MD  glucose blood (ONE TOUCH ULTRA TEST) test strip USE AS DIRECTED TWICE A DAY Patient not taking: Reported on 02/12/2019 10/22/18   Steele Sizer, MD  insulin degludec (TRESIBA FLEXTOUCH) 100 UNIT/ML SOPN FlexTouch Pen Inject 0.2-0.5 mLs (20-50 Units total) into the skin daily. 01/28/19   Steele Sizer, MD  levocetirizine (XYZAL) 5 MG tablet Take 5 mg by mouth as needed for allergies.    [provider]  OZEMPIC, 1 MG/DOSE, 2 MG/1.5ML SOPN INJECT 1 MG INTO THE SKIN ONCE A WEEK. 03/06/19   Ancil Boozer, Drue Stager, MD  rosuvastatin (CRESTOR) 20 MG tablet Take 1 tablet (20 mg total) by mouth daily. Patient not taking: Reported on 02/12/2019 10/22/18   Steele Sizer, MD    Family History Family History  Problem Relation Age of Onset  . Breast cancer Maternal Aunt   . Hypertension Mother   . Kidney Stones Mother   . Hypertension Father   . Congestive Heart Failure Father   . Hypertension Paternal Grandfather   . Diabetes Sister   . Vision loss Sister   . Varicose Veins Sister   . Hypertension Sister   . Breast cancer Sister 4       Double Mastectomy  . Hypertension Sister     Social History Social History   Tobacco Use  . Smoking status: Never Smoker  . Smokeless tobacco: Never Used  Substance Use Topics  . Alcohol use: No  . Drug use: No     Allergies   Keflex [cephalexin]   Review of Systems Review of Systems All other systems negative except as documented in the HPI. All pertinent positives and negatives as reviewed in the HPI.  Physical Exam Updated Vital Signs BP (!) 154/98    Pulse 78   Temp 98.7 F (37.1 C)   Resp 12   SpO2 97%   Physical Exam Vitals signs and nursing note reviewed.  Constitutional:      General: She is not in acute distress.    Appearance: She is well-developed.  HENT:     Head: Normocephalic and atraumatic.  Eyes:     Pupils: Pupils are equal, round, and reactive to light.  Neck:     Musculoskeletal: Normal range of motion and neck supple.  Cardiovascular:     Rate and Rhythm: Normal  rate and regular rhythm.     Heart sounds: Normal heart sounds. No murmur. No friction rub. No gallop.   Pulmonary:     Effort: Pulmonary effort is normal. No respiratory distress.     Breath sounds: Normal breath sounds. No wheezing.  Abdominal:     General: Bowel sounds are normal. There is no distension.     Palpations: Abdomen is soft.     Tenderness: There is no abdominal tenderness.  Skin:    General: Skin is warm and dry.     Capillary Refill: Capillary refill takes less than 2 seconds.     Findings: No erythema or rash.  Neurological:     Mental Status: She is alert and oriented to person, place, and time.     Motor: No weakness or abnormal muscle tone.     Coordination: Coordination normal.     Gait: Gait normal.  Psychiatric:        Behavior: Behavior normal.      ED Treatments / Results  Labs (all labs ordered are listed, but only abnormal results are displayed) Labs Reviewed  COMPREHENSIVE METABOLIC PANEL - Abnormal; Notable for the following components:      Result Value   Sodium 132 (*)    Chloride 93 (*)    Glucose, Bld 316 (*)    Albumin 3.1 (*)    All other components within normal limits  CBC - Abnormal; Notable for the following components:   WBC 11.9 (*)    RBC 5.40 (*)    Hemoglobin 16.5 (*)    HCT 46.8 (*)    All other components within normal limits  LIPASE, BLOOD  URINALYSIS, ROUTINE W REFLEX MICROSCOPIC  SEDIMENTATION RATE  C-REACTIVE PROTEIN    EKG None  Radiology No results found.  Procedures  Procedures (including critical care time)  Medications Ordered in ED Medications  sodium chloride flush (NS) 0.9 % injection 3 mL (has no administration in time range)  ondansetron (ZOFRAN-ODT) disintegrating tablet 4 mg (4 mg Oral Given 07/01/19 1204)     Initial Impression / Assessment and Plan / ED Course  I have reviewed the triage vital signs and the nursing notes.  Pertinent labs & imaging results that were available during my care of the patient were reviewed by me and considered in my medical decision making (see chart for details).        Patient will need further evaluation with MRI and MRA.  Patient could have a complex migraine will need work-up other issues before arriving to that conclusion.  Feel that the patient is stable otherwise.  Patient's blood pressure is high but she did not take 1 of her medications today.  Final Clinical Impressions(s) / ED Diagnoses   Final diagnoses:  None    ED Discharge Orders    None       Rebeca Allegra 07/01/19 [place], MD 07/02/19 1825

## 2019-07-01 NOTE — Consult Note (Signed)
Neurology Consultation  Reason for Consult: Stroke Referring Physician: Dr. Marlowe Sax  CC: Headache and vision problems  History is obtained from: Patient, chart, patient's sister at bedside  HPI: Meagan [name] is a 68 y.o. female past medical history of non-insulin-dependent diabetes, hypertension, sleep apnea, in her usual state of health till about Sunday, 06/28/2019, when she started having a headache that was persistent and holocephalic.  She also then started noticing that she is having difficulty looking towards the left side of her visual field.  In the week preceding this, she had been to her ophthalmologist for her eye exam and was referred to a retina specialist who had done some injections.  She attributed her vision changes to possible periprocedural sort of events but when the symptoms did not resolve over the next 2 or 3 days, that is when she decided to come to the emergency room.  She reports of some residual right-sided weakness from before-unclear of etiology.  Left leg is also a little weak at baseline because of a cellulitis some years ago. She denies any chest pain or shortness of breath.  Denies fevers chills.  Denies abdominal pain nausea vomiting. She does report some left-sided numbness that has also been present since Sunday or maybe even Saturday.    LKW: 06/28/2019 at 7 AM tpa given?: no, outside the window Premorbid modified Rankin scale (mRS): 1-has some balance issues, does not use a walker or cane  ROS: ROS was performed and is negative except as noted in the HPI.   Past Medical History:  Diagnosis Date  . Cellulitis    left leg  . 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 Diffuse cystic mastopathy   . Fall 2013  . Hypertension 2004  . Occlusion and stenosis of carotid artery without mention of cerebral infarction   . Sleep apnea     Family History  Problem Relation Age of Onset  . Breast cancer Maternal Aunt   .  Hypertension Mother   . Kidney Stones Mother   . Hypertension Father   . Congestive Heart Failure Father   . Hypertension Paternal Grandfather   . Diabetes Sister   . Vision loss Sister   . Varicose Veins Sister   . Hypertension Sister   . Breast cancer Sister 30       Double Mastectomy  . Hypertension Sister    Social History:   reports that she has never smoked. She has never used smokeless tobacco. She reports that she does not drink alcohol or use drugs.  Medications No current facility-administered medications for this encounter.   Current Outpatient Medications:  .  ibuprofen (ADVIL) 200 MG tablet, Take 400 mg by mouth every 6 (six) hours as needed for headache or mild pain., Disp: , Rfl:  .  metFORMIN (GLUCOPHAGE-XR) 750 MG 24 hr tablet, Take 2 tablets (1,500 mg total) by mouth daily with breakfast. (Patient taking differently: Take 750 mg by mouth 2 (two) times daily. ), Disp: 180 tablet, Rfl: 0 .  olmesartan-hydrochlorothiazide (BENICAR HCT) 40-25 MG tablet, Take 1 tablet by mouth daily., Disp: 90 tablet, Rfl: 0 .  amLODipine (NORVASC) 5 MG tablet, TAKE 1-2 TABLETS (5-10 MG TOTAL) BY MOUTH DAILY., Disp: 30 tablet, Rfl: 0 .  aspirin 81 MG tablet, Take 81 mg by mouth daily., Disp: , Rfl:  .  dapagliflozin propanediol (FARXIGA) 10 MG TABS tablet, Take 10 mg by mouth daily., Disp: 90 tablet, Rfl: 0 .  diphenhydrAMINE (  BENADRYL) 12.5 MG/5ML elixir, Take 10 mLs (25 mg total) by mouth at bedtime. (Patient not taking: Reported on 02/12/2019), Disp: 118 mL, Rfl: 0 .  fluconazole (DIFLUCAN) 150 MG tablet, Take 1 tablet (150 mg total) by mouth every other day. (Patient not taking: Reported on 02/12/2019), Disp: 3 tablet, Rfl: 0 .  fluticasone (FLONASE) 50 MCG/ACT nasal spray, Place 2 sprays into both nostrils daily. (Patient not taking: Reported on 02/12/2019), Disp: 48 g, Rfl: 1 .  glucose blood (ONE TOUCH ULTRA TEST) test strip, USE AS DIRECTED TWICE A DAY (Patient not taking: Reported on  02/12/2019), Disp: 100 each, Rfl: 1 .  insulin degludec (TRESIBA FLEXTOUCH) 100 UNIT/ML SOPN FlexTouch Pen, Inject 0.2-0.5 mLs (20-50 Units total) into the skin daily., Disp: 9 mL, Rfl: 0 .  levocetirizine (XYZAL) 5 MG tablet, Take 5 mg by mouth as needed for allergies., Disp: , Rfl:  .  OZEMPIC, 1 MG/DOSE, 2 MG/1.5ML SOPN, INJECT 1 MG INTO THE SKIN ONCE A WEEK., Disp: 9 pen, Rfl: 0 .  rosuvastatin (CRESTOR) 20 MG tablet, Take 1 tablet (20 mg total) by mouth daily. (Patient not taking: Reported on 02/12/2019), Disp: 90 tablet, Rfl: 0   Exam: Current vital signs: BP (!) 145/84   Pulse 93   Temp 98.7 F (37.1 C)   Resp 13   SpO2 95%  Vital signs in last 24 hours: Temp:  [98.7 F (37.1 C)] 98.7 F (37.1 C) (09/23 1158) Pulse Rate:  [74-93] 93 (09/23 1900) Resp:  [10-18] 13 (09/23 1900) BP: (145-186)/(84-114) 145/84 (09/23 1900) SpO2:  [95 %-99 %] 95 % (09/23 1900) General: Awake alert in no distress. HEENT: Normocephalic atraumatic Lungs: Breathing normally and saturating well on room air Abdomen: Soft nondistended nontender Extremities: Warm well perfused with trace edema in both lower extremities Neurological exam Awake alert oriented x3 Speech is not dysarthric Naming repetition comprehension and fluency are all intact with ability to follow commands without a problem. Cranial nerves: Pupils equal round reactive to light, extraocular movements are intact, visual field exam shows a left homonymous hemianopsia, facial sensation is intact bilaterally, face is symmetric, auditory acuity is intact, tongue and palate midline. Motor exam: Both upper extremities are 5/5 without any drift.  Both lower extremities have mild drift with right slightly weaker than the left-that is baseline. Sensory exam: Decreased sensation on left arm and leg compared to the right to light touch.  No extinction. Coordination: Intact finger-nose-finger testing.  Difficult to perform lower extremity coordination  testing due to some weakness and discomfort. Gait testing was deferred due to patient safety NIH stroke scale 1a Level of Conscious.: 0 1b LOC Questions: 0 1c LOC Commands: 0 2 Best Gaze: 0 3 Visual: 2 4 Facial Palsy: 0 5a Motor Arm - left: 0 5b Motor Arm - Right: 0 6a Motor Leg - Left: 1 6b Motor Leg - Right: 1 7 Limb Ataxia: 0 8 Sensory: 1 9 Best Language: 0 10 Dysarthria: 0 11 Extinct. and Inatten.: 0 TOTAL: 5   Labs I have reviewed labs in epic and the results pertinent to this consultation are: Mild leukocytosis, hyponatremia, hyperglycemia.  CBC    Component Value Date/Time   WBC 11.9 (H) 07/01/2019 1204   RBC 5.40 (H) 07/01/2019 1204   HGB 16.5 (H) 07/01/2019 1204   HCT 46.8 (H) 07/01/2019 1204   PLT 297 07/01/2019 1204   MCV 86.7 07/01/2019 1204   MCH 30.6 07/01/2019 1204   MCHC 35.3 07/01/2019 1204   RDW  12.1 07/01/2019 1204   LYMPHSABS 2,280 03/26/2018 1543   MONOABS 756 11/06/2016 1100   EOSABS 126 03/26/2018 1543   BASOSABS 39 03/26/2018 1543    CMP     Component Value Date/Time   NA 132 (L) 07/01/2019 1204   NA 138 01/31/2016 1115   K 3.9 07/01/2019 1204   CL 93 (L) 07/01/2019 1204   CO2 25 07/01/2019 1204   GLUCOSE 316 (H) 07/01/2019 1204   BUN 11 07/01/2019 1204   BUN 10 01/31/2016 1115   CREATININE 0.82 07/01/2019 1204   CREATININE 0.84 03/26/2018 1543   CALCIUM 9.0 07/01/2019 1204   PROT 7.7 07/01/2019 1204   PROT 7.3 01/31/2016 1115   ALBUMIN 3.1 (L) 07/01/2019 1204   ALBUMIN 4.1 01/31/2016 1115   AST 17 07/01/2019 1204   ALT 18 07/01/2019 1204   ALKPHOS 126 07/01/2019 1204   BILITOT 0.7 07/01/2019 1204   BILITOT 0.5 01/31/2016 1115   GFRNONAA >60 07/01/2019 1204   GFRNONAA 73 03/26/2018 1543   GFRAA >60 07/01/2019 1204   GFRAA 85 03/26/2018 1543   Imaging I have reviewed the images obtained: MRI examination of the brain done today in the emergency room reveals an acute infarction in the right parieto-occipital junction  affecting the region measuring about 5 x 2 x 3 cm with no evidence of infarct.  Moderate chronic small vessel ischemic changes. MRA head and neck-technically limited due to not administration of contrast for the cervical region-does not show any large or medium vessel occlusion.  Distal vessel atherosclerotic irregularity in both PCA branches including focal stenosis of the right P1 P2 junction. No significant neck disease suspected but technically limited due to no contrast.  Assessment:  68 year old with diabetes, hypertension, hyperlipidemia presenting for 3-day history of headache and left-sided visual field deficits. MRI of the brain with a right PCA territory infarction that is acute/subacute. MRA head and neck with focal stenosis of the right P1 P2 junction-likely the etiology for the infarction. Needs admission and stroke risk factor work-up at this time.  Impression: Acute ischemic stroke-right PCA territory. Diabetes Hypertension  hyperlipidemia  Recommendations: -Admit to hospitalist or observation -Telemetry monitoring -Allow for permissive hypertension for the first 24-48h - only treat PRN if SBP >220 mmHg. Blood pressures can be gradually normalized to SBP<140 upon discharge. -Echocardiogram -HgbA1c, fasting lipid panel -Frequent neuro checks -Prophylactic therapy-Antiplatelet med: Aspirin - dose 325mg  PO or 300mg  PR plus Plavix 75-due to intracranial atherosclerosis I would recommend the dual antiplatelet therapy. -Atorvastatin 80 mg PO daily -PT consult, OT consult, Speech consult  Please page stroke NP/PA/MD (listed on AMION)  from 8am-4 pm as this patient will be followed by the stroke team at this point.   -- Amie Portland, MD [facility] Pager: [phone] If 7pm to 7am, please call on call as listed on AMION.

## 2019-07-01 NOTE — ED Notes (Signed)
Patient states that her nausea and vomiting better and doesn't need zofran currently

## 2019-07-01 NOTE — ED Triage Notes (Signed)
Patient sent by ophthalmologist for further evaluation of hypertension - BP was 170/100 at office this morning. She states she just received shots for increased eye pressure last week. Endorses headache with nausea since Sunday.

## 2019-07-02 ENCOUNTER — Inpatient Hospital Stay (HOSPITAL_COMMUNITY): Payer: BC Managed Care – PPO

## 2019-07-02 ENCOUNTER — Other Ambulatory Visit: Payer: Self-pay | Admitting: Cardiology

## 2019-07-02 DIAGNOSIS — I6389 Other cerebral infarction: Secondary | ICD-10-CM

## 2019-07-02 DIAGNOSIS — I639 Cerebral infarction, unspecified: Secondary | ICD-10-CM

## 2019-07-02 DIAGNOSIS — I63531 Cerebral infarction due to unspecified occlusion or stenosis of right posterior cerebral artery: Principal | ICD-10-CM

## 2019-07-02 LAB — CBC
HCT: 47.4 % — ABNORMAL HIGH (ref 36.0–46.0)
Hemoglobin: 16.2 g/dL — ABNORMAL HIGH (ref 12.0–15.0)
MCH: 29.9 pg (ref 26.0–34.0)
MCHC: 34.2 g/dL (ref 30.0–36.0)
MCV: 87.6 fL (ref 80.0–100.0)
Platelets: 329 10*3/uL (ref 150–400)
RBC: 5.41 MIL/uL — ABNORMAL HIGH (ref 3.87–5.11)
RDW: 12 % (ref 11.5–15.5)
WBC: 11.1 10*3/uL — ABNORMAL HIGH (ref 4.0–10.5)
nRBC: 0 % (ref 0.0–0.2)

## 2019-07-02 LAB — RAPID URINE DRUG SCREEN, HOSP PERFORMED
Amphetamines: NOT DETECTED
Barbiturates: NOT DETECTED
Benzodiazepines: NOT DETECTED
Cocaine: NOT DETECTED
Opiates: NOT DETECTED
Tetrahydrocannabinol: NOT DETECTED

## 2019-07-02 LAB — CBG MONITORING, ED
Glucose-Capillary: 284 mg/dL — ABNORMAL HIGH (ref 70–99)
Glucose-Capillary: 226 mg/dL — ABNORMAL HIGH (ref 70–99)

## 2019-07-02 LAB — LIPID PANEL
Cholesterol: 267 mg/dL — ABNORMAL HIGH (ref 0–200)
HDL: 32 mg/dL — ABNORMAL LOW (ref >40)
LDL Cholesterol: 199 mg/dL — ABNORMAL HIGH (ref 0–99)
Total CHOL/HDL Ratio: 8.3 RATIO
Triglycerides: 182 mg/dL — ABNORMAL HIGH (ref <150)
VLDL: 36 mg/dL (ref 0–40)

## 2019-07-02 LAB — ECHOCARDIOGRAM COMPLETE

## 2019-07-02 LAB — GLUCOSE, CAPILLARY
Glucose-Capillary: 179 mg/dL — ABNORMAL HIGH (ref 70–99)
Glucose-Capillary: 227 mg/dL — ABNORMAL HIGH (ref 70–99)
Glucose-Capillary: 266 mg/dL — ABNORMAL HIGH (ref 70–99)

## 2019-07-02 LAB — SARS CORONAVIRUS 2 (TAT 6-24 HRS): SARS Coronavirus 2: NEGATIVE

## 2019-07-02 LAB — HIV ANTIBODY (ROUTINE TESTING W REFLEX): HIV Screen 4th Generation wRfx: NONREACTIVE

## 2019-07-02 LAB — HEMOGLOBIN A1C
Hgb A1c MFr Bld: 10.1 % — ABNORMAL HIGH (ref 4.8–5.6)
Mean Plasma Glucose: 243.17 mg/dL

## 2019-07-02 MED ORDER — INSULIN GLARGINE 100 UNIT/ML ~~LOC~~ SOLN
20.0000 [IU] | Freq: Every day | SUBCUTANEOUS | Status: DC
  Administered 2019-07-02 – 2019-07-04 (×3): 20 [IU] via SUBCUTANEOUS
  Filled 2019-07-02 (×3): qty 0.2

## 2019-07-02 MED ORDER — IOHEXOL 350 MG/ML SOLN
75.0000 mL | Freq: Once | INTRAVENOUS | Status: AC | PRN
  Administered 2019-07-02: 09:00:00 75 mL via INTRAVENOUS

## 2019-07-02 MED ORDER — SODIUM CHLORIDE 0.9 % IV SOLN
INTRAVENOUS | Status: DC
  Administered 2019-07-02 (×2): via INTRAVENOUS

## 2019-07-02 NOTE — Progress Notes (Signed)
PROGRESS NOTE    Meagan NEMECHEK  [id number] DOB: Apr 12, 1951 DOA: 07/01/2019 PCP: Steele Sizer, MD   Brief Narrative: 68 year old with past medical history significant for diabetes type 2, hypertension, hyperlipidemia, carotid artery stenosis presenting to the hospital for evaluation of high blood pressure and headache.  Patient has not been taking her medications as prescribed.  She reports having right-sided occipital headache for the past few days.  She was seen by an eye specialist who told her that the pressure in her eyes was high and had given her an injection.  She continued to have headache and about 3 days ago noticed that she lost her peripheral vision.  Evaluation in the ED patient was found to have an acute infarct in the right parietal-occipital junction 5 x 2 x 3 cm.  MRA of the head and neck without evidence of large vessel occlusion.    Assessment & Plan:   Principal Problem:   Acute CVA (cerebrovascular accident) [facility]) Active Problems:   Hypertension, benign   Type 2 diabetes, uncontrolled, with neuropathy (HCC)   Leukocytosis   Polycythemia  1-Acute CVA, right parietal occipital junction; --Continue neuro check. -Started on dual antiplatelet therapy aspirin and Plavix. -CTA neck:45% diameter stenosis proximal right internal carotid artery. 60% diameter stenosis right external carotid artery 30% diameter stenosis distal left common carotid artery. Leftinternal carotid artery mildly disease without significant stenosis. -Follow stroke team recommendation. -Permissive  hypertension -Hydration for concentration. -Follow echo. -Needs better diabetic control hemoglobin A1c at 10. -LDL 199 started on Lipitor  2-hypertension: Permissive hypertension Started blood pressure in the 140s.  Will hold Norvasc.  3-hemoconcentration: Patient report poor oral intake.  Will start IV fluids and follow hemoglobin If persistently elevated will need follow-up as an outpatient  with hematology for polycythemia  4-diabetes, hyperglycemia uncontrolled: Start Lantus, patient used to receive at home Diet education    Estimated body mass index is 33.76 kg/m as calculated from the following:   Height as of 10/28/18: 5\' 5"  (1.651 m).   Weight as of 10/28/18: 92 kg.   DVT prophylaxis: Lovenox Code Status: Full code Family Communication: Discussed with patient Disposition Plan: Remain in the hospital for work-up of his stroke need PT OT Consultants:   Neurology  Procedures:   Echo pending  Antimicrobials:  None  Subjective: Patient is alert and oriented, she still have difficulty seeing, she is seeing double.  She noted some improvement of right arm weakness  Objective: Vitals:   07/02/19 1200 07/02/19 1215 07/02/19 1230 07/02/19 1245  BP: (!) 161/89 (!) 141/84 (!) 146/81 (!) 147/95  Pulse:      Resp: (!) 9 15 10 20   Temp:      SpO2:       No intake or output data in the 24 hours ending 07/02/19 1532 There were no vitals filed for this visit.  Examination:  General exam: Appears calm and comfortable  Respiratory system: Clear to auscultation. Respiratory effort normal. Cardiovascular system: S1 & S2 heard, RRR. No JVD, murmurs, rubs, gallops or clicks. No pedal edema. Gastrointestinal system: Abdomen is nondistended, soft and nontender. No organomegaly or masses felt. Normal bowel sounds heard. Central nervous system: Alert and oriented.  No significant weakness, report double vision Extremities: Symmetric 5 x 5 power. Skin: No rashes, lesions or ulcers Psychiatry: Judgement and insight appear normal. Mood & affect appropriate.     Data Reviewed: I have personally reviewed following labs and imaging studies  CBC: Recent Labs  Lab 07/01/19  1204 07/02/19 0411  WBC 11.9* 11.1*  HGB 16.5* 16.2*  HCT 46.8* 47.4*  MCV 86.7 87.6  PLT 297 [id number]   Basic Metabolic Panel: Recent Labs  Lab 07/01/19 1204  NA 132*  K 3.9  CL 93*  CO2 25    GLUCOSE 316*  BUN 11  CREATININE 0.82  CALCIUM 9.0   GFR: CrCl cannot be calculated (Unknown ideal weight.). Liver Function Tests: Recent Labs  Lab 07/01/19 1204  AST 17  ALT 18  ALKPHOS 126  BILITOT 0.7  PROT 7.7  ALBUMIN 3.1*   Recent Labs  Lab 07/01/19 1204  LIPASE 33   No results for input(s): AMMONIA in the last 168 hours. Coagulation Profile: No results for input(s): INR, PROTIME in the last 168 hours. Cardiac Enzymes: No results for input(s): CKTOTAL, CKMB, CKMBINDEX, TROPONINI in the last 168 hours. BNP (last 3 results) No results for input(s): PROBNP in the last 8760 hours. HbA1C: Recent Labs    07/02/19 0411  HGBA1C 10.1*   CBG: Recent Labs  Lab 07/01/19 2321 07/02/19 0818 07/02/19 1238  GLUCAP 327* 284* 226*   Lipid Profile: Recent Labs    07/02/19 0411  CHOL 267*  HDL 32*  LDLCALC 199*  TRIG 182*  CHOLHDL 8.3   Thyroid Function Tests: No results for input(s): TSH, T4TOTAL, FREET4, T3FREE, THYROIDAB in the last 72 hours. Anemia Panel: No results for input(s): VITAMINB12, FOLATE, FERRITIN, TIBC, IRON, RETICCTPCT in the last 72 hours. Sepsis Labs: No results for input(s): PROCALCITON, LATICACIDVEN in the last 168 hours.  Recent Results (from the past 240 hour(s))  SARS CORONAVIRUS 2 (TAT 6-24 HRS) Nasopharyngeal Nasopharyngeal Swab     Status: None   Collection Time: 07/01/19  6:59 PM   Specimen: Nasopharyngeal Swab  Result Value Ref Range Status   SARS Coronavirus 2 NEGATIVE NEGATIVE Final    Comment: (NOTE) SARS-CoV-2 target nucleic acids are NOT DETECTED. The SARS-CoV-2 RNA is generally detectable in upper and lower respiratory specimens during the acute phase of infection. Negative results do not preclude SARS-CoV-2 infection, do not rule out co-infections with other pathogens, and should not be used as the sole basis for treatment or other patient management decisions. Negative results must be combined with clinical  observations, patient history, and epidemiological information. The expected result is Negative. Fact Sheet for Patients: SugarRoll.be Fact Sheet for Healthcare Providers: https://www.woods-mathews.com/ This test is not yet approved or cleared by the [place] FDA and  has been authorized for detection and/or diagnosis of SARS-CoV-2 by FDA under an Emergency Use Authorization (EUA). This EUA will remain  in effect (meaning this test can be used) for the duration of the COVID-19 declaration under Section 56 4(b)(1) of the Act, 21 U.S.C. section 360bbb-3(b)(1), unless the authorization is terminated or revoked sooner. Performed at [facility], [place] [address] [place], [place] [address]          Radiology Studies: Ct Angio Neck W Or Wo Contrast  Result Date: 07/02/2019 CLINICAL DATA:  Stroke.  Right occipital infarct. EXAM: CT ANGIOGRAPHY NECK TECHNIQUE: Multidetector CT imaging of the neck was performed using the standard protocol during bolus administration of intravenous contrast. Multiplanar CT image reconstructions and MIPs were obtained to evaluate the vascular anatomy. Carotid stenosis measurements (when applicable) are obtained utilizing NASCET criteria, using the distal internal carotid diameter as the denominator. CONTRAST:  77mL OMNIPAQUE IOHEXOL 350 MG/ML SOLN COMPARISON:  MRI head 07/01/2019 FINDINGS: Aortic arch: Standard branching. Imaged portion shows no evidence  of aneurysm or dissection. No significant stenosis of the major arch vessel origins. Right carotid system: Atherosclerotic calcified plaque right carotid bifurcation. Right internal carotid artery narrowed to 2.5 mm corresponding to 45% diameter stenosis. Approximately 60% diameter stenosis proximal right external carotid artery. Left carotid system: Atherosclerotic disease distal left common carotid artery narrowing the lumen by 30% diameter stenosis. Atherosclerotic  calcification proximal left internal carotid artery without significant stenosis. Vertebral arteries: Left vertebral artery dominant. Widely patent to the basilar. Non dominant right vertebral artery widely patent to the basilar without stenosis. Skeleton: Mild cervical spine degenerative change. No acute skeletal abnormality. Other neck: Negative for mass or adenopathy. Upper chest: Negative IMPRESSION: 1. 45% diameter stenosis proximal right internal carotid artery. 60% diameter stenosis right external carotid artery 2. 30% diameter stenosis distal left common carotid artery. Left internal carotid artery mildly disease without significant stenosis 3. Both vertebral arteries are patent to the basilar without stenosis. Electronically Signed   By: Franchot Gallo M.D.   On: 07/02/2019 09:52   Mr Angio Head Wo Contrast  Result Date: 07/01/2019 CLINICAL DATA:  Acute severe headache. Arterial dissection suspected. Possible temporal arteritis. EXAM: MRI HEAD WITHOUT CONTRAST MRA HEAD WITHOUT CONTRAST MRA NECK WITHOUT CONTRAST TECHNIQUE: Multiplanar, multiecho pulse sequences of the brain and surrounding structures were obtained without intravenous contrast. Angiographic images of the Circle of Willis were obtained using MRA technique without intravenous contrast. Angiographic images of the neck were obtained using MRA technique without intravenous contrast. Carotid stenosis measurements (when applicable) are obtained utilizing NASCET criteria, using the distal internal carotid diameter as the denominator. COMPARISON:  12/19/2006 FINDINGS: MRI HEAD FINDINGS Brain: Diffusion imaging shows acute infarction at the parieto-occipital junction region on the right affecting an area measuring approximately 5 x 2 x 3 cm. Mild swelling but no visible hemorrhage. No other acute finding. There chronic small-vessel ischemic changes of the pons. No focal cerebellar insult. Cerebral hemispheres show moderate chronic small-vessel  ischemic changes throughout the deep and subcortical hemispheric white matter. No mass lesion, hydrocephalus or extra-axial collection. Vascular: Major vessels at the base of the brain show flow. Skull and upper cervical spine: Negative Sinuses/Orbits: Clear/normal Other: None MRA HEAD FINDINGS Both internal carotid arteries are widely patent into the brain. No siphon stenosis. The anterior and middle cerebral vessels are patent without proximal stenosis, aneurysm or vascular malformation. Both vertebral arteries are widely patent to the basilar. No basilar stenosis. Posterior circulation branch vessels show flow. There is atherosclerotic narrowing and irregularity of the PCA branches, with a focal stenosis at the right P1 P2 junction. MRA NECK FINDINGS The noncontrast exam is technically deficient but the patient could not tolerate repeat. It can be stated that there is antegrade flow in both vertebral arteries with the left being dominant. Both vertebral arteries reach the basilar. No basilar stenosis is seen of the proximal vessel. Little information can be obtained regarding the carotid arteries in the neck. Both common carotid arteries do show antegrade flow and both internal carotid arteries do appear to be patent. IMPRESSION: Acute infarction at the right parieto-occipital junction affecting a region of brain measuring about 5 x 2 x 3 cm. No evidence of hemorrhage. Moderate chronic small-vessel ischemic changes elsewhere affecting the pons in the cerebral hemispheric white matter. Intracranial MR angiography does not show any large or medium vessel occlusion. There is distal vessel atherosclerotic irregularity in both posterior cerebral artery branches, including a focal stenosis at the right P1 P2 junction. No significant neck vessel disease  suspected, but detail is quite limited due to technical deficiency of the noncontrast neck MRA. The patient would not allow repeat imaging. Electronically Signed   By:  Nelson Chimes M.D.   On: 07/01/2019 18:27   Mr Angio Neck Wo Contrast  Result Date: 07/01/2019 CLINICAL DATA:  Acute severe headache. Arterial dissection suspected. Possible temporal arteritis. EXAM: MRI HEAD WITHOUT CONTRAST MRA HEAD WITHOUT CONTRAST MRA NECK WITHOUT CONTRAST TECHNIQUE: Multiplanar, multiecho pulse sequences of the brain and surrounding structures were obtained without intravenous contrast. Angiographic images of the Circle of Willis were obtained using MRA technique without intravenous contrast. Angiographic images of the neck were obtained using MRA technique without intravenous contrast. Carotid stenosis measurements (when applicable) are obtained utilizing NASCET criteria, using the distal internal carotid diameter as the denominator. COMPARISON:  12/19/2006 FINDINGS: MRI HEAD FINDINGS Brain: Diffusion imaging shows acute infarction at the parieto-occipital junction region on the right affecting an area measuring approximately 5 x 2 x 3 cm. Mild swelling but no visible hemorrhage. No other acute finding. There chronic small-vessel ischemic changes of the pons. No focal cerebellar insult. Cerebral hemispheres show moderate chronic small-vessel ischemic changes throughout the deep and subcortical hemispheric white matter. No mass lesion, hydrocephalus or extra-axial collection. Vascular: Major vessels at the base of the brain show flow. Skull and upper cervical spine: Negative Sinuses/Orbits: Clear/normal Other: None MRA HEAD FINDINGS Both internal carotid arteries are widely patent into the brain. No siphon stenosis. The anterior and middle cerebral vessels are patent without proximal stenosis, aneurysm or vascular malformation. Both vertebral arteries are widely patent to the basilar. No basilar stenosis. Posterior circulation branch vessels show flow. There is atherosclerotic narrowing and irregularity of the PCA branches, with a focal stenosis at the right P1 P2 junction. MRA NECK FINDINGS  The noncontrast exam is technically deficient but the patient could not tolerate repeat. It can be stated that there is antegrade flow in both vertebral arteries with the left being dominant. Both vertebral arteries reach the basilar. No basilar stenosis is seen of the proximal vessel. Little information can be obtained regarding the carotid arteries in the neck. Both common carotid arteries do show antegrade flow and both internal carotid arteries do appear to be patent. IMPRESSION: Acute infarction at the right parieto-occipital junction affecting a region of brain measuring about 5 x 2 x 3 cm. No evidence of hemorrhage. Moderate chronic small-vessel ischemic changes elsewhere affecting the pons in the cerebral hemispheric white matter. Intracranial MR angiography does not show any large or medium vessel occlusion. There is distal vessel atherosclerotic irregularity in both posterior cerebral artery branches, including a focal stenosis at the right P1 P2 junction. No significant neck vessel disease suspected, but detail is quite limited due to technical deficiency of the noncontrast neck MRA. The patient would not allow repeat imaging. Electronically Signed   By: Nelson Chimes M.D.   On: 07/01/2019 18:27   Mr Brain Wo Contrast (neuro Protocol)  Result Date: 07/01/2019 CLINICAL DATA:  Acute severe headache. Arterial dissection suspected. Possible temporal arteritis. EXAM: MRI HEAD WITHOUT CONTRAST MRA HEAD WITHOUT CONTRAST MRA NECK WITHOUT CONTRAST TECHNIQUE: Multiplanar, multiecho pulse sequences of the brain and surrounding structures were obtained without intravenous contrast. Angiographic images of the Circle of Willis were obtained using MRA technique without intravenous contrast. Angiographic images of the neck were obtained using MRA technique without intravenous contrast. Carotid stenosis measurements (when applicable) are obtained utilizing NASCET criteria, using the distal internal carotid diameter as  the denominator. COMPARISON:  12/19/2006 FINDINGS: MRI HEAD FINDINGS Brain: Diffusion imaging shows acute infarction at the parieto-occipital junction region on the right affecting an area measuring approximately 5 x 2 x 3 cm. Mild swelling but no visible hemorrhage. No other acute finding. There chronic small-vessel ischemic changes of the pons. No focal cerebellar insult. Cerebral hemispheres show moderate chronic small-vessel ischemic changes throughout the deep and subcortical hemispheric white matter. No mass lesion, hydrocephalus or extra-axial collection. Vascular: Major vessels at the base of the brain show flow. Skull and upper cervical spine: Negative Sinuses/Orbits: Clear/normal Other: None MRA HEAD FINDINGS Both internal carotid arteries are widely patent into the brain. No siphon stenosis. The anterior and middle cerebral vessels are patent without proximal stenosis, aneurysm or vascular malformation. Both vertebral arteries are widely patent to the basilar. No basilar stenosis. Posterior circulation branch vessels show flow. There is atherosclerotic narrowing and irregularity of the PCA branches, with a focal stenosis at the right P1 P2 junction. MRA NECK FINDINGS The noncontrast exam is technically deficient but the patient could not tolerate repeat. It can be stated that there is antegrade flow in both vertebral arteries with the left being dominant. Both vertebral arteries reach the basilar. No basilar stenosis is seen of the proximal vessel. Little information can be obtained regarding the carotid arteries in the neck. Both common carotid arteries do show antegrade flow and both internal carotid arteries do appear to be patent. IMPRESSION: Acute infarction at the right parieto-occipital junction affecting a region of brain measuring about 5 x 2 x 3 cm. No evidence of hemorrhage. Moderate chronic small-vessel ischemic changes elsewhere affecting the pons in the cerebral hemispheric white matter.  Intracranial MR angiography does not show any large or medium vessel occlusion. There is distal vessel atherosclerotic irregularity in both posterior cerebral artery branches, including a focal stenosis at the right P1 P2 junction. No significant neck vessel disease suspected, but detail is quite limited due to technical deficiency of the noncontrast neck MRA. The patient would not allow repeat imaging. Electronically Signed   By: Nelson Chimes M.D.   On: 07/01/2019 18:27        Scheduled Meds:  aspirin  325 mg Oral Daily   atorvastatin  80 mg Oral q1800   clopidogrel  75 mg Oral Daily   enoxaparin (LOVENOX) injection  40 mg Subcutaneous Q24H   insulin aspart  0-5 Units Subcutaneous QHS   insulin aspart  0-9 Units Subcutaneous TID WC   insulin glargine  20 Units Subcutaneous Daily   Continuous Infusions:  sodium chloride 75 mL/hr at 07/02/19 1046     LOS: 1 day    Time spent: 35 minutes.     Elmarie Shiley, MD [facility] Pager [phone]  If 7PM-7AM, please contact night-coverage [email or web address] Password [facility] 07/02/2019, 3:32 PM

## 2019-07-02 NOTE — ED Notes (Signed)
TELE 

## 2019-07-02 NOTE — Progress Notes (Signed)
  Echocardiogram 2D Echocardiogram has been performed.  Kip Cropp L Androw 07/02/2019, 12:15 PM

## 2019-07-02 NOTE — ED Notes (Signed)
Breakfast Ordered 

## 2019-07-02 NOTE — Progress Notes (Addendum)
STROKE TEAM PROGRESS NOTE   INTERVAL HISTORY Patient sister at the bedside.  Patient lying in bed, awake alert, complaining of left hemianopia.  She admitted her sugar not in good control.  Reviewed neuro imaging with patient and states her at bedside.  Vitals:   07/02/19 1200 07/02/19 1215 07/02/19 1230 07/02/19 1245  BP: (!) 161/89 (!) 141/84 (!) 146/81 (!) 147/95  Pulse:      Resp: (!) 9 15 10 20   Temp:      SpO2:        CBC:  Recent Labs  Lab 07/01/19 1204 07/02/19 0411  WBC 11.9* 11.1*  HGB 16.5* 16.2*  HCT 46.8* 47.4*  MCV 86.7 87.6  PLT 297 [id number]    Basic Metabolic Panel:  Recent Labs  Lab 07/01/19 1204  NA 132*  K 3.9  CL 93*  CO2 25  GLUCOSE 316*  BUN 11  CREATININE 0.82  CALCIUM 9.0   Lipid Panel:     Component Value Date/Time   CHOL 267 (H) 07/02/2019 0411   CHOL 200 (H) 01/31/2016 1115   TRIG 182 (H) 07/02/2019 0411   HDL 32 (L) 07/02/2019 0411   HDL 34 (L) 01/31/2016 1115   CHOLHDL 8.3 07/02/2019 0411   VLDL 36 07/02/2019 0411   LDLCALC 199 (H) 07/02/2019 0411   LDLCALC 132 (H) 03/26/2018 1543   HgbA1c:  Lab Results  Component Value Date   HGBA1C 10.1 (H) 07/02/2019   Urine Drug Screen:     Component Value Date/Time   LABOPIA NONE DETECTED 07/02/2019 0828   COCAINSCRNUR NONE DETECTED 07/02/2019 0828   LABBENZ NONE DETECTED 07/02/2019 0828   AMPHETMU NONE DETECTED 07/02/2019 0828   THCU NONE DETECTED 07/02/2019 0828   LABBARB NONE DETECTED 07/02/2019 0828    Alcohol Level No results found for: ETH  IMAGING Ct Angio Neck W Or Wo Contrast  Result Date: 07/02/2019 CLINICAL DATA:  Stroke.  Right occipital infarct. EXAM: CT ANGIOGRAPHY NECK TECHNIQUE: Multidetector CT imaging of the neck was performed using the standard protocol during bolus administration of intravenous contrast. Multiplanar CT image reconstructions and MIPs were obtained to evaluate the vascular anatomy. Carotid stenosis measurements (when applicable) are obtained  utilizing NASCET criteria, using the distal internal carotid diameter as the denominator. CONTRAST:  64mL OMNIPAQUE IOHEXOL 350 MG/ML SOLN COMPARISON:  MRI head 07/01/2019 FINDINGS: Aortic arch: Standard branching. Imaged portion shows no evidence of aneurysm or dissection. No significant stenosis of the major arch vessel origins. Right carotid system: Atherosclerotic calcified plaque right carotid bifurcation. Right internal carotid artery narrowed to 2.5 mm corresponding to 45% diameter stenosis. Approximately 60% diameter stenosis proximal right external carotid artery. Left carotid system: Atherosclerotic disease distal left common carotid artery narrowing the lumen by 30% diameter stenosis. Atherosclerotic calcification proximal left internal carotid artery without significant stenosis. Vertebral arteries: Left vertebral artery dominant. Widely patent to the basilar. Non dominant right vertebral artery widely patent to the basilar without stenosis. Skeleton: Mild cervical spine degenerative change. No acute skeletal abnormality. Other neck: Negative for mass or adenopathy. Upper chest: Negative IMPRESSION: 1. 45% diameter stenosis proximal right internal carotid artery. 60% diameter stenosis right external carotid artery 2. 30% diameter stenosis distal left common carotid artery. Left internal carotid artery mildly disease without significant stenosis 3. Both vertebral arteries are patent to the basilar without stenosis. Electronically Signed   By: Franchot Gallo M.D.   On: 07/02/2019 09:52   Mr Angio Head Wo Contrast  Result Date: 07/01/2019 CLINICAL DATA:  Acute severe headache. Arterial dissection suspected. Possible temporal arteritis. EXAM: MRI HEAD WITHOUT CONTRAST MRA HEAD WITHOUT CONTRAST MRA NECK WITHOUT CONTRAST TECHNIQUE: Multiplanar, multiecho pulse sequences of the brain and surrounding structures were obtained without intravenous contrast. Angiographic images of the Circle of Willis were  obtained using MRA technique without intravenous contrast. Angiographic images of the neck were obtained using MRA technique without intravenous contrast. Carotid stenosis measurements (when applicable) are obtained utilizing NASCET criteria, using the distal internal carotid diameter as the denominator. COMPARISON:  12/19/2006 FINDINGS: MRI HEAD FINDINGS Brain: Diffusion imaging shows acute infarction at the parieto-occipital junction region on the right affecting an area measuring approximately 5 x 2 x 3 cm. Mild swelling but no visible hemorrhage. No other acute finding. There chronic small-vessel ischemic changes of the pons. No focal cerebellar insult. Cerebral hemispheres show moderate chronic small-vessel ischemic changes throughout the deep and subcortical hemispheric white matter. No mass lesion, hydrocephalus or extra-axial collection. Vascular: Major vessels at the base of the brain show flow. Skull and upper cervical spine: Negative Sinuses/Orbits: Clear/normal Other: None MRA HEAD FINDINGS Both internal carotid arteries are widely patent into the brain. No siphon stenosis. The anterior and middle cerebral vessels are patent without proximal stenosis, aneurysm or vascular malformation. Both vertebral arteries are widely patent to the basilar. No basilar stenosis. Posterior circulation branch vessels show flow. There is atherosclerotic narrowing and irregularity of the PCA branches, with a focal stenosis at the right P1 P2 junction. MRA NECK FINDINGS The noncontrast exam is technically deficient but the patient could not tolerate repeat. It can be stated that there is antegrade flow in both vertebral arteries with the left being dominant. Both vertebral arteries reach the basilar. No basilar stenosis is seen of the proximal vessel. Little information can be obtained regarding the carotid arteries in the neck. Both common carotid arteries do show antegrade flow and both internal carotid arteries do appear  to be patent. IMPRESSION: Acute infarction at the right parieto-occipital junction affecting a region of brain measuring about 5 x 2 x 3 cm. No evidence of hemorrhage. Moderate chronic small-vessel ischemic changes elsewhere affecting the pons in the cerebral hemispheric white matter. Intracranial MR angiography does not show any large or medium vessel occlusion. There is distal vessel atherosclerotic irregularity in both posterior cerebral artery branches, including a focal stenosis at the right P1 P2 junction. No significant neck vessel disease suspected, but detail is quite limited due to technical deficiency of the noncontrast neck MRA. The patient would not allow repeat imaging. Electronically Signed   By: Nelson Chimes M.D.   On: 07/01/2019 18:27   Mr Angio Neck Wo Contrast  Result Date: 07/01/2019 CLINICAL DATA:  Acute severe headache. Arterial dissection suspected. Possible temporal arteritis. EXAM: MRI HEAD WITHOUT CONTRAST MRA HEAD WITHOUT CONTRAST MRA NECK WITHOUT CONTRAST TECHNIQUE: Multiplanar, multiecho pulse sequences of the brain and surrounding structures were obtained without intravenous contrast. Angiographic images of the Circle of Willis were obtained using MRA technique without intravenous contrast. Angiographic images of the neck were obtained using MRA technique without intravenous contrast. Carotid stenosis measurements (when applicable) are obtained utilizing NASCET criteria, using the distal internal carotid diameter as the denominator. COMPARISON:  12/19/2006 FINDINGS: MRI HEAD FINDINGS Brain: Diffusion imaging shows acute infarction at the parieto-occipital junction region on the right affecting an area measuring approximately 5 x 2 x 3 cm. Mild swelling but no visible hemorrhage. No other acute finding. There chronic small-vessel ischemic changes of the pons. No focal cerebellar  insult. Cerebral hemispheres show moderate chronic small-vessel ischemic changes throughout the deep and  subcortical hemispheric white matter. No mass lesion, hydrocephalus or extra-axial collection. Vascular: Major vessels at the base of the brain show flow. Skull and upper cervical spine: Negative Sinuses/Orbits: Clear/normal Other: None MRA HEAD FINDINGS Both internal carotid arteries are widely patent into the brain. No siphon stenosis. The anterior and middle cerebral vessels are patent without proximal stenosis, aneurysm or vascular malformation. Both vertebral arteries are widely patent to the basilar. No basilar stenosis. Posterior circulation branch vessels show flow. There is atherosclerotic narrowing and irregularity of the PCA branches, with a focal stenosis at the right P1 P2 junction. MRA NECK FINDINGS The noncontrast exam is technically deficient but the patient could not tolerate repeat. It can be stated that there is antegrade flow in both vertebral arteries with the left being dominant. Both vertebral arteries reach the basilar. No basilar stenosis is seen of the proximal vessel. Little information can be obtained regarding the carotid arteries in the neck. Both common carotid arteries do show antegrade flow and both internal carotid arteries do appear to be patent. IMPRESSION: Acute infarction at the right parieto-occipital junction affecting a region of brain measuring about 5 x 2 x 3 cm. No evidence of hemorrhage. Moderate chronic small-vessel ischemic changes elsewhere affecting the pons in the cerebral hemispheric white matter. Intracranial MR angiography does not show any large or medium vessel occlusion. There is distal vessel atherosclerotic irregularity in both posterior cerebral artery branches, including a focal stenosis at the right P1 P2 junction. No significant neck vessel disease suspected, but detail is quite limited due to technical deficiency of the noncontrast neck MRA. The patient would not allow repeat imaging. Electronically Signed   By: Nelson Chimes M.D.   On: 07/01/2019 18:27    Mr Brain Wo Contrast (neuro Protocol)  Result Date: 07/01/2019 CLINICAL DATA:  Acute severe headache. Arterial dissection suspected. Possible temporal arteritis. EXAM: MRI HEAD WITHOUT CONTRAST MRA HEAD WITHOUT CONTRAST MRA NECK WITHOUT CONTRAST TECHNIQUE: Multiplanar, multiecho pulse sequences of the brain and surrounding structures were obtained without intravenous contrast. Angiographic images of the Circle of Willis were obtained using MRA technique without intravenous contrast. Angiographic images of the neck were obtained using MRA technique without intravenous contrast. Carotid stenosis measurements (when applicable) are obtained utilizing NASCET criteria, using the distal internal carotid diameter as the denominator. COMPARISON:  12/19/2006 FINDINGS: MRI HEAD FINDINGS Brain: Diffusion imaging shows acute infarction at the parieto-occipital junction region on the right affecting an area measuring approximately 5 x 2 x 3 cm. Mild swelling but no visible hemorrhage. No other acute finding. There chronic small-vessel ischemic changes of the pons. No focal cerebellar insult. Cerebral hemispheres show moderate chronic small-vessel ischemic changes throughout the deep and subcortical hemispheric white matter. No mass lesion, hydrocephalus or extra-axial collection. Vascular: Major vessels at the base of the brain show flow. Skull and upper cervical spine: Negative Sinuses/Orbits: Clear/normal Other: None MRA HEAD FINDINGS Both internal carotid arteries are widely patent into the brain. No siphon stenosis. The anterior and middle cerebral vessels are patent without proximal stenosis, aneurysm or vascular malformation. Both vertebral arteries are widely patent to the basilar. No basilar stenosis. Posterior circulation branch vessels show flow. There is atherosclerotic narrowing and irregularity of the PCA branches, with a focal stenosis at the right P1 P2 junction. MRA NECK FINDINGS The noncontrast exam is  technically deficient but the patient could not tolerate repeat. It can be stated that there  is antegrade flow in both vertebral arteries with the left being dominant. Both vertebral arteries reach the basilar. No basilar stenosis is seen of the proximal vessel. Little information can be obtained regarding the carotid arteries in the neck. Both common carotid arteries do show antegrade flow and both internal carotid arteries do appear to be patent. IMPRESSION: Acute infarction at the right parieto-occipital junction affecting a region of brain measuring about 5 x 2 x 3 cm. No evidence of hemorrhage. Moderate chronic small-vessel ischemic changes elsewhere affecting the pons in the cerebral hemispheric white matter. Intracranial MR angiography does not show any large or medium vessel occlusion. There is distal vessel atherosclerotic irregularity in both posterior cerebral artery branches, including a focal stenosis at the right P1 P2 junction. No significant neck vessel disease suspected, but detail is quite limited due to technical deficiency of the noncontrast neck MRA. The patient would not allow repeat imaging. Electronically Signed   By: Nelson Chimes M.D.   On: 07/01/2019 18:27    PHYSICAL EXAM  Pulse Rate:  [68-94] 68 (09/24 0615) Resp:  [9-21] 20 (09/24 1245) BP: (121-168)/(67-98) 147/95 (09/24 1245) SpO2:  [95 %-100 %] 96 % (09/24 0615)  General - Well nourished, well developed, in no apparent distress.  Ophthalmologic - fundi not visualized due to noncooperation.  Cardiovascular - Regular rhythm and rate.  Mental Status -  Level of arousal and orientation to time, place, and person were intact. Language including expression, naming, repetition, comprehension was assessed and found intact. Fund of Knowledge was assessed and was intact.  Cranial Nerves II - XII - II - left homonymous hemianopia. III, IV, VI - Extraocular movements intact. V - Facial sensation intact bilaterally. VII -  Facial movement intact bilaterally. VIII - Hearing & vestibular intact bilaterally. X - Palate elevates symmetrically. XI - Chin turning & shoulder shrug intact bilaterally. XII - Tongue protrusion intact.  Motor Strength - The patient's strength was normal in all extremities and pronator drift was absent.  Bulk was normal and fasciculations were absent.   Motor Tone - Muscle tone was assessed at the neck and appendages and was normal.  Reflexes - The patient's reflexes were symmetrical in all extremities and she had no pathological reflexes.  Sensory - Light touch, temperature/pinprick were assessed and were symmetrical.    Coordination - The patient had normal movements in the hands and feet with no ataxia or dysmetria.  Tremor was absent.  Gait and Station - deferred.   ASSESSMENT/PLAN Ms. TEMPY ALCINDOR is a 68 y.o. female with history of NIDDM, HTN, OSA who had recent eye injections presenting with HA, difficulty looking to the L that did not resolve and L sided numbness since 9/20.   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 Small vessel disease.   MRA head  atherosclerosis B PCAs w/ focal stenosis R P1/P2 jxn.  MRA neck motion degraded  CTA neck R ICA 45%, R ECA 60%, L CCA 30% stenoses   2D Echo pending  Recommend 30 day monitor as outpatient to rule out A. fib (ordered through [facility])  LDL 199  HgbA1c 10.1  Lovenox 40 mg sq daily for VTE prophylaxis  aspirin 81 mg daily prior to admission, now on aspirin 325 mg daily and clopidogrel 75 mg daily.  Continue aspirin 325 and Plavix 75 for 3 months due to intracranial stenosis and then Plavix alone.  Therapy recommendations:  pending   Disposition:  pending   Patient cannot drive due to left hemianopia until visual deficit improved and cleared by ophthalmology.  Discussed with patient and sister, they expressed  understanding  Diabetes type II Uncontrolled  HgbA1c 10.1, goal < 7.0  CBGs  SSI  Hyperglycemia  Management per primary team  Close PCP follow-up for better DM control  Hypertension  Stable . Permissive hypertension (OK if < 220/120) but gradually normalize in 5-7 days . Long-term BP goal normotensive  Hyperlipidemia  Home meds:  crestor 20  Now on lipitor 80  LDL 199, goal < 70  Continue statin at discharge  Other Stroke Risk Factors  Advanced age  Obesity, recommend weight loss, diet and exercise as appropriate   Obstructive sleep apnea  Other Active Problems  Leukocytosis, WBC 11.9-11.1, afebrile  Left hemianopia not able to drive.  Need ophthalmology close follow-up to clear if visual deficit improved.  Hospital day # 1  Neurology will sign off. Please call with questions. Pt will follow up with stroke clinic NP at [facility] in about 4 weeks. Thanks for the consult.  Rosalin Hawking, MD PhD Stroke Neurology 07/02/2019 4:45 PM    To contact Stroke Continuity provider, please refer to [email or web address] After hours, contact General Neurology

## 2019-07-03 ENCOUNTER — Encounter (HOSPITAL_COMMUNITY): Payer: Self-pay | Admitting: *Deleted

## 2019-07-03 LAB — CBC
HCT: 43 % (ref 36.0–46.0)
Hemoglobin: 14.6 g/dL (ref 12.0–15.0)
MCH: 29.8 pg (ref 26.0–34.0)
MCHC: 34 g/dL (ref 30.0–36.0)
MCV: 87.8 fL (ref 80.0–100.0)
Platelets: 286 10*3/uL (ref 150–400)
RBC: 4.9 MIL/uL (ref 3.87–5.11)
RDW: 12.2 % (ref 11.5–15.5)
WBC: 9.5 10*3/uL (ref 4.0–10.5)
nRBC: 0 % (ref 0.0–0.2)

## 2019-07-03 LAB — GLUCOSE, CAPILLARY
Glucose-Capillary: 156 mg/dL — ABNORMAL HIGH (ref 70–99)
Glucose-Capillary: 224 mg/dL — ABNORMAL HIGH (ref 70–99)
Glucose-Capillary: 225 mg/dL — ABNORMAL HIGH (ref 70–99)

## 2019-07-03 LAB — BASIC METABOLIC PANEL
Anion gap: 10 (ref 5–15)
BUN: 14 mg/dL (ref 8–23)
CO2: 28 mmol/L (ref 22–32)
Calcium: 8.6 mg/dL — ABNORMAL LOW (ref 8.9–10.3)
Chloride: 100 mmol/L (ref 98–111)
Creatinine, Ser: 0.8 mg/dL (ref 0.44–1.00)
GFR calc Af Amer: >60 mL/min (ref >60)
GFR calc non Af Amer: >60 mL/min (ref >60)
Glucose, Bld: 136 mg/dL — ABNORMAL HIGH (ref 70–99)
Potassium: 2.9 mmol/L — ABNORMAL LOW (ref 3.5–5.1)
Sodium: 138 mmol/L (ref 135–145)

## 2019-07-03 MED ORDER — TRAMADOL HCL 50 MG PO TABS: 50.0000 mg | ORAL_TABLET | Freq: Once | ORAL | Status: DC

## 2019-07-03 MED ORDER — TRAMADOL HCL 50 MG PO TABS
50.0000 mg | ORAL_TABLET | Freq: Four times a day (QID) | ORAL | Status: DC
  Administered 2019-07-03 – 2019-07-04 (×3): 50 mg via ORAL
  Filled 2019-07-03 (×3): qty 1

## 2019-07-03 MED ORDER — POTASSIUM CHLORIDE CRYS ER 20 MEQ PO TBCR
40.0000 meq | EXTENDED_RELEASE_TABLET | ORAL | Status: AC
  Administered 2019-07-03 (×2): 40 meq via ORAL
  Filled 2019-07-03 (×2): qty 2

## 2019-07-03 NOTE — Progress Notes (Signed)
  Speech Language Pathology Treatment: Cognitive-Linquistic  Patient Details Name: Meagan [name] MRN: [id number] DOB: [birth year] Today's Date: 07/03/2019 Time: [id number] SLP Time Calculation (min) (ACUTE ONLY): 14 min  Assessment / Plan / Recommendation Clinical Impression  Pt was seen for cognitive-linguistic treatment and was cooperative. She was intermittently tearful throughout the session and indicated that she was frustrated since she believed that her choices had led to her being in her current condition. Pt's level of tearfulness increased with the complexity of tasks completed and she repeatedly stated that she does not like to get things wrong. She was consoled by this SLP and her daughter and was in good spirits upon the SLP's departure. She achieved 100% accuracy with a 3-task sequencing mental manipulation activity when additional processing time was given. She achieved 60% accuracy with time management problems increasing to 100% with repetition and additional processing time. Pt's session was terminated prematurely since her lunch arrived and she stated that she was unable to finish breakfast and was therefore hungry. SLP will continue to follow pt.    HPI HPI: Pt is a 68 y.o. female with medical history significant of type 2 diabetes, hypertension, hyperlipidemia, carotid artery stenosis who presented to the hospital for evaluation of high blood pressure and headache. MRI of the brain revealed acute infarction at the right parieto-occipital junction affecting a region of brain measuring about 5 x 2 x 3 cm.      SLP Plan     Patient needs continued Speech Lanaguage Pathology Services    Recommendations                   Follow up Recommendations: Inpatient Rehab SLP Visit Diagnosis: Cognitive communication deficit (R41.841)       Takiya Belmares I. Hardin Negus, [place], [place] Office number [phone] Pager [place] 07/03/2019, 12:44 PM

## 2019-07-03 NOTE — Progress Notes (Signed)
Nutrition Brief Note  RD consulted for diet education regarding type 2 diabetes mellitus. HbA1c 10.1%. Pt unavailable during attempted time of contact. Handout "Counting Carbohydrates for People with Diabetes" from the [employer] of [employer] Diet[employer] Manual given and placed in pt discharge instructions. RD to follow up with diet education as appropriate.   Corrin Parker, MS, RD, LDN Pager # [phone] After hours/ weekend pager # [phone]

## 2019-07-03 NOTE — Evaluation (Addendum)
Speech Language Pathology Evaluation Patient Details Name: Meagan [name] MRN: [id number] DOB: 1951-07-25 Today's Date: 07/03/2019 Time: [id number] SLP Time Calculation (min) (ACUTE ONLY): 35 min  Problem List:  Patient Active Problem List   Diagnosis Date Noted  . Acute CVA (cerebrovascular accident) ([place]) 07/01/2019  . Leukocytosis 07/01/2019  . Polycythemia 07/01/2019  . Morbid obesity ([place]) 10/22/2018  . Non compliance with medical treatment 03/26/2018  . Dyslipidemia associated with type 2 diabetes mellitus ([place]) 12/04/2016  . Uncontrolled type 2 diabetes mellitus with microalbuminuria, without long-term current use of insulin ([place]) 12/04/2016  . Seasonal allergies 02/01/2016  . OSA (obstructive sleep apnea) 10/06/2015  . Hypertensive retinopathy 10/06/2015  . Thyroid cyst 10/06/2015  . Dyslipidemia 10/06/2015  . Type 2 diabetes, uncontrolled, with neuropathy ([place]) 10/06/2015  . History of pneumonia 10/06/2015  . Chronic neck pain 10/06/2015  . Occlusion and stenosis of carotid artery without mention of cerebral infarction   . Hypertension, benign    Past Medical History:  Past Medical History:  Diagnosis Date  . Cellulitis    left leg  . 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 Diffuse cystic mastopathy   . Fall 2013  . Hypertension 2004  . Occlusion and stenosis of carotid artery without mention of cerebral infarction   . Sleep apnea    Past Surgical History:  Past Surgical History:  Procedure Laterality Date  . APPENDECTOMY  2000s  . BIOPSY THYROID  2006   [facility]  . BREAST EXCISIONAL BIOPSY Left 2000s   neg  . CARPAL TUNNEL RELEASE Left March 2015  . COLONOSCOPY  2011   DR.WOHL  . COLONOSCOPY WITH PROPOFOL N/A 07/11/2017   Procedure: COLONOSCOPY WITH PROPOFOL;  Surgeon: Jonathon Bellows, MD;  Location: [facility] ENDOSCOPY;  Service: Gastroenterology;  Laterality: N/A;  . ganglion cyst removal     . KNEE SURGERY Left 08/2009  . KNEE  SURGERY Right 04/07/2010  . ROTATOR CUFF REPAIR Right 2008  . TUBAL LIGATION     HPI:  Pt is a 68 y.o. female with medical history significant of type 2 diabetes, hypertension, hyperlipidemia, carotid artery stenosis who presented to the hospital for evaluation of high blood pressure and headache. MRI of the brain revealed acute infarction at the right parieto-occipital junction affecting a region of brain measuring about 5 x 2 x 3 cm.   Assessment / Plan / Recommendation Clinical Impression  Pt reported that she was living independently prior to admission and she was employed as a Education officer, [employer]. She denied any baseline deficits in speech, language or cognition. She stated that she has not observed any acute changes in speech but expressed that her processing speed is now slow and that she has been having difficulty with word retrieval. Pt was intermittently tearful throughout the session and was consoled by this daughter who was present.   The [facility] Cognitive Assessment Blind was completed to evaluate the pt's cognitive-linguistic skills. She achieved a score of 20/22 which is within the normal limits of 18 or more out of 22. However, she consistently required additional processing time for completion of all tasks and she repetition was needed for accuracy with complex problem solving and executive function tasks, suggesting a mild impairment. No speech/language deficits were noted during the evaluation but additional processing time was needed for all auditory comprehension tasks. Skilled SLP services are clinically indicated at this time to improve pt's cognitive-linguistic skills. Pt, her daughter, and nursing were educated regarding results and recommendations;  all parties verbalized understanding as well as agreement with plan of care.    SLP Assessment  SLP Recommendation/Assessment: Patient needs continued Speech Lanaguage Pathology Services SLP Visit Diagnosis: Cognitive communication  deficit (R41.841)    Follow Up Recommendations  Inpatient Rehab    Frequency and Duration min 2x/week  2 weeks      SLP Evaluation Cognition  Overall Cognitive Status: Impaired/Different from baseline Arousal/Alertness: Awake/alert Orientation Level: Oriented X4 Attention: Focused;Sustained Focused Attention: Appears intact(Vigilance WNL: 1/1) Sustained Attention: Appears intact(Serial 7s: 3/3) Memory: Impaired Memory Impairment: Storage deficit;Retrieval deficit;Decreased recall of new information(Immediate: 5/5; delayed: 4/5; with cue: 1/1) Awareness: Appears intact Problem Solving: Appears intact Executive Function: Reasoning;Organizing Reasoning: Appears intact(Abstraction: 2/2) Organizing: Appears intact(Backward digit span: 1/1) Comments: Pt's overall processing speed was reduced during the evaluation.        Comprehension  Auditory Comprehension Overall Auditory Comprehension: Appears within functional limits for tasks assessed Yes/No Questions: Within Functional Limits Basic Biographical Questions: (5/5) Complex Questions: (4/5) Paragraph Comprehension (via yes/no questions): (3/4) Two Step Basic Commands: (3/3) Multistep Basic Commands: (2/3) Conversation: Complex Interfering Components: Working Financial planner: Within Function Limits Reading Comprehension Reading Status: Not tested(Due to visual deficits)    Expression Expression Primary Mode of Expression: Verbal Verbal Expression Overall Verbal Expression: Appears within functional limits for tasks assessed Initiation: No impairment Automatic Speech: Counting;Day of week;Month of year(WNL; additional processing time needed) Level of Generative/Spontaneous Verbalization: Conversation Repetition: No impairment Naming: No impairment Responsive: (5/5) Confrontation: Not tested(Due to visual deficits.) Divergent: (1/1) Pragmatics: No  impairment Written Expression Dominant Hand: Right   Oral / Motor  Oral Motor/Sensory Function Overall Oral Motor/Sensory Function: Within functional limits Motor Speech Overall Motor Speech: Appears within functional limits for tasks assessed Respiration: Within functional limits Phonation: Normal Resonance: Within functional limits Articulation: Within functional limitis Intelligibility: Intelligible Motor Planning: Witnin functional limits Motor Speech Errors: Not applicable   Traeson Dusza I. Hardin Negus, [place], [place] Office number [phone] Pager [place] 07/03/2019, 12:28 PM

## 2019-07-03 NOTE — Progress Notes (Signed)
Inpatient Rehab Admissions:  Inpatient Rehab Consult received.  Note pt currently performing mobility with min/min guard up to 90', and ADLs with overall min assist.  Demonstrates mild cognitive deficits, requiring increased time and repeated directions for score of 20/22 on MOCA Blind.  Feel that pt will progress quickly to supervision level, and will not need the intensity or duration of a CIR stay.  Will sign off at this time.   Shann Medal, PT, DPT Admissions Coordinator [phone] 07/03/19  2:34 PM

## 2019-07-03 NOTE — Evaluation (Signed)
Occupational Therapy Evaluation Patient Details Name: Meagan [name] MRN: [id number] DOB: 1950-12-22 Today's Date: 07/03/2019    History of Present Illness 68 y.o. female with medical history significant of type 2 diabetes, hypertension, hyperlipidemia, carotid artery stenosis presented 07/01/19 to the hospital for evaluation of high blood pressure and headache.  She had not been taking her home blood pressure and diabetes medications as prescribed. 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 Left homonymous hemianopsia   Clinical Impression   Pt PTA: pt living alone, working and reports independence with ADL and mobility. Pt currently requiring set-upA to modA for overall ADL. Pt limited by poor vision and constant need to turn head in all angles to see especially on L side. Pt  requiring increased assist for LB ADL due to weakness and poor hand eye coordination and depth perception.  Pt given turning head and focusing on one object to compensate/adapt visual field on L side. Pt performing transfers and functional mobility with minguardA. Pt would benefit from continued OT skilled services for ADL, mobility and safety in CIR setting. Pt is highly motivated to return to PLOF. OT following acutely.     Follow Up Recommendations  CIR    Equipment Recommendations  None recommended by OT    Recommendations for Other Services       Precautions / Restrictions Precautions Precautions: Fall Restrictions Weight Bearing Restrictions: No      Mobility Bed Mobility               General bed mobility comments: up in chair upon arrival  Transfers Overall transfer level: Needs assistance Equipment used: None Transfers: Sit to/from Stand Sit to Stand: Min guard         General transfer comment: pt with significant visual changes potentially impacting her balance    Balance Overall balance assessment: Mild deficits observed, not formally tested;Needs  assistance Sitting-balance support: No upper extremity supported;Feet supported Sitting balance-Leahy Scale: Fair     Standing balance support: No upper extremity supported Standing balance-Leahy Scale: Fair                             ADL either performed or assessed with clinical judgement   ADL Overall ADL's : Needs assistance/impaired Eating/Feeding: Set up;Sitting Eating/Feeding Details (indicate cue type and reason): poor vision, requires increased time Grooming: Min guard;Standing;Cueing for safety Grooming Details (indicate cue type and reason): Moving head in all angles to see objects on L side. Upper Body Bathing: Set up;Sitting   Lower Body Bathing: Moderate assistance;Cueing for safety;Cueing for sequencing;Sitting/lateral leans;Sit to/from stand Lower Body Bathing Details (indicate cue type and reason): requiring assist for LB ADL due to weakness and poor ability from vision deficits Upper Body Dressing : Set up;Sitting   Lower Body Dressing: Moderate assistance;Cueing for safety;Sitting/lateral leans;Sit to/from stand Lower Body Dressing Details (indicate cue type and reason): requiring assist for LB ADL due to weakness and poor ability from vision deficits Toilet Transfer: Supervision/safety;Regular Toilet;Grab bars   Toileting- Clothing Manipulation and Hygiene: Min guard;Sitting/lateral lean;Sit to/from stand Toileting - Clothing Manipulation Details (indicate cue type and reason): RW in front     Functional mobility during ADLs: Min guard;Minimal assistance;Rolling walker;Cueing for safety General ADL Comments: Pt limited by poor vision and constant need to turn head in all angles to see especially on L side. Pt  requiring increased assist for LB ADL due to weakness and poor hand  eye coordination and depth perception     Vision Baseline Vision/History: No visual deficits Patient Visual Report: No change from baseline Vision Assessment?: Vision  impaired- to be further tested in functional context;Yes Eye Alignment: Within Functional Limits Ocular Range of Motion: Restricted on the left Tracking/Visual Pursuits: Left eye does not track laterally Saccades: Additional eye shifts occurred during testing;Additional head turns occurred during testing;Overshoots;Undershoots;Decreased speed of saccadic movement Convergence: Impaired (comment) Visual Fields: Left homonymous hemianopsia Additional Comments: Pt reports "it looks like a old TV fuzziness in my eye"     Perception Perception Perception Tested?: Yes Perception Deficits: Spatial orientation   Praxis      Pertinent Vitals/Pain Pain Assessment: No/denies pain     Hand Dominance Right   Extremity/Trunk Assessment Upper Extremity Assessment Upper Extremity Assessment: Overall WFL for tasks assessed   Lower Extremity Assessment Lower Extremity Assessment: Generalized weakness   Cervical / Trunk Assessment Cervical / Trunk Assessment: Other exceptions Cervical / Trunk Exceptions: obese   Communication Communication Communication: No difficulties   Cognition Arousal/Alertness: Awake/alert Behavior During Therapy: WFL for tasks assessed/performed Overall Cognitive Status: Within Functional Limits for tasks assessed                                 General Comments: pt able to recall recent events to help her problem-solve what day of the week it was   General Comments  142/90 pre-session    Exercises     Shoulder Instructions      Home Living Family/patient expects to be discharged to:: Private residence Living Arrangements: Alone Available Help at Discharge: Family;Available 24 hours/day Type of Home: House Home Access: Stairs to enter [employer] of Steps: 5 Entrance Stairs-Rails: Right;Left Home Layout: One level     Bathroom Shower/Tub: Corporate investment banker: Standard     Home Equipment: Environmental consultant - 2  wheels   Additional Comments: information is for her sister's home; walker 10+yrs old      Prior Functioning/Environment Level of Independence: Independent        Comments: states her bad knees have started to buckle on her; has not fallen to floor but has fallen onto bed or couch; 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         OT Problem List: Decreased strength;Decreased activity tolerance;Impaired balance (sitting and/or standing);Impaired vision/perception      OT Treatment/Interventions: Self-care/ADL training;Therapeutic exercise;Neuromuscular education;Energy conservation;DME and/or AE instruction;Therapeutic activities;Visual/perceptual remediation/compensation;Patient/family education;Balance training    OT Goals(Current goals can be found in the care plan section) Acute Rehab OT Goals Patient Stated Goal: be able to see and read to return to work OT Goal Formulation: With patient Time For Goal Achievement: 07/17/19 Potential to Achieve Goals: Good ADL Goals Pt Will Perform Grooming: with modified independence;standing Pt/caregiver will Perform Home Exercise Program: With written HEP provided Additional ADL Goal #1: Pt will increase to using 3 compensatory or adaptive strategies with mobility and ADL with L sided visual deficits  OT Frequency: Min 2X/week   Barriers to D/C:            Co-evaluation              AM-PAC OT "6 Clicks" Daily Activity     Outcome Measure Help from another person eating meals?: A Little Help from another person taking care of personal grooming?: A Little Help from another person toileting, which includes using  toliet, bedpan, or urinal?: A Little Help from another person bathing (including washing, rinsing, drying)?: A Little Help from another person to put on and taking off regular upper body clothing?: None Help from another person to put on and taking off regular lower body clothing?: A Little 6 Click  Score: 19   End of Session Equipment Utilized During Treatment: Gait belt;Rolling walker Nurse Communication: Mobility status  Activity Tolerance: Patient tolerated treatment well Patient left: in chair;with call bell/phone within reach;with chair alarm set  OT Visit Diagnosis: Unsteadiness on feet (R26.81);Repeated falls (R29.6);Low vision, both eyes (H54.2)                Time: [id number] OT Time Calculation (min): 32 min Charges:  OT General Charges $OT Visit: 1 Visit OT Evaluation $OT Eval Moderate Complexity: 1 Mod OT Treatments $Self Care/Home Management : 8-22 mins  Ebony Hail Harold Hedge) Marsa Aris OTR/L Acute Rehabilitation Services Pager: [phone] Office: [place] 07/03/2019, 10:38 AM

## 2019-07-03 NOTE — Progress Notes (Addendum)
Inpatient Diabetes Program Recommendations  AACE/ADA: New Consensus Statement on Inpatient Glycemic Control   Target Ranges:  Prepandial:   less than 140 mg/dL      Peak postprandial:   less than 180 mg/dL (1-2 hours)      Critically ill patients:  140 - 180 mg/dL   Results for Meagan [name], Meagan [name] (MRN [id number]) as of 07/03/2019 13:58  Ref. Range 07/02/2019 08:18 07/02/2019 12:38 07/02/2019 17:05 07/02/2019 20:23 07/02/2019 23:34 07/03/2019 06:54 07/03/2019 12:33  Glucose-Capillary Latest Ref Range: 70 - 99 mg/dL 284 (H) 226 (H) 266 (H) 227 (H) 179 (H) 156 (H) 224 (H)  Results for Meagan [name], Meagan [name] (MRN [id number]) as of 07/03/2019 16:35  Ref. Range 07/02/2019 04:11  Hemoglobin A1C Latest Ref Range: 4.8 - 5.6 % 10.1 (H)   Review of Glycemic Control  Diabetes history: DM2 Outpatient Diabetes medications: Metformin XR 750 mg BID, Tresiba 25 units daily, Ozempic 1 mg Qweek Current orders for Inpatient glycemic control: Lantus 20 units daily, Novolog 0-9 units TID with meals, Novolog 0-5 units QHS  Inpatient Diabetes Program Recommendations:   Insulin-Meal Coverage: Please consider ordering Novolog 3 units TID with meals for meal coverage if patient eats at least 50% of meals.  HbgA1C: A1C 10.1% on 07/02/19 indicating an average glucose of 243 mg/dl over the past 2-3 months. Patient is out of [place], is allergic to [place], and prefers to take Trulicity instead of Ozempic.  NOTE: Spoke with patient and her daughter about diabetes and home regimen for diabetes control. Patient reports being followed by PCP for diabetes management and currently prescribed Metformin XR 750 mg BID, Tresiba 25 units daily, and Ozempic 1 mg Qweek. Patient has Farxiga 10 mg daily on home medication list and patient reports that she does not take that medication because she developed a rash when she took it and she does not want to take that medication (added as an allergy).  Patient reports that she is out of [place] and has not taken  it in a few weeks. Patient notes that she was started on Ozempic in July or August and she was on taking Trulicity prior to that and her PCP changed her to [facility]. Patient reports that she would prefer to go back on Trulicity. Patient reports that she was not taking medications as prescribed because she "messed up and thought she knew best" but she notes she was wrong and she plans to do exactly as she is told to do.   Patient states that she has not been checking glucose consistently and she reports that she needs a new glucometer, test strips, and lancets.  Inquired about prior A1C and patient reports last A1C value was in the 10% range and prior to that it was 14%. Discussed A1C results (10.1% on 07/02/19 ) and explained that current A1C indicates an average glucose of 243 mg/dl over the past 2-3 months. Discussed glucose and A1C goals. Discussed importance of checking CBGs and maintaining good CBG control to prevent long-term and short-term complications. Stressed to the patient the importance of improving glycemic control to prevent further complications from uncontrolled diabetes. Encouraged patient to check glucose as MD directs. Also discussed FreeStyle Libre (flash glucose monitoring sensor) and explained how it works and how it could provide more information for her and her PCP to make adjustments with DM medications to improve DM control. Encouraged patient to talk with PCP about the FreeStyle [place] if she is interested in using it.  Encouraged patient to be sure  to get needed supplies and medications for DM control, to take medications as prescribed, and to follow up with PCP regarding DM control.  Patient verbalized understanding of information discussed and reports no further questions at this time related to diabetes.  At time of discharge please provide Rx for: glucose monitoring kit, Trulicity (was taking Trulicity and was changed to Ozempic but prefers to use Trulicity), and [place] if those  medications are continued by discharging provider.  Thanks, Barnie Alderman, RN, MSN, CDE Diabetes Coordinator Inpatient Diabetes Program [phone] (Team Pager from 8am to 5pm)

## 2019-07-03 NOTE — Progress Notes (Signed)
Chaplain responded to a referral for prayer.  The family and the patient participated in this prayer service.  Chaplain plans to visit with the patient later.  Brion Aliment Chaplain Resident For questions concerning this note please contact me by pager [phone]

## 2019-07-03 NOTE — Evaluation (Signed)
Physical Therapy Evaluation Patient Details Name: Meagan [name] MRN: [id number] DOB: [birth year] Today's Date: 07/03/2019   History of Present Illness  68 y.o. female with medical history significant of type 2 diabetes, hypertension, hyperlipidemia, carotid artery stenosis presented 07/01/19 to the hospital for evaluation of high blood pressure and headache.  She had not been taking her home blood pressure and diabetes medications as prescribed. 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 Left homonymous hemianopsia  Clinical Impression   Pt admitted with above diagnosis. Patient's mobility significantly impaired due to vision changes (her description is much more complex than a homonymous hemianopsia--seems all quadrants involved, impaired depth perception, impaired distinguishing colors making neutral environments more challenging to negotiate). She can benefit from PT to further assess balance and DME needs that will best provide support when her knees are bothering her (and have had a tendency to buckle) combined with decr vision. Pt currently with functional limitations due to the deficits listed below (see PT Problem List). Pt will benefit from skilled PT to increase their independence and safety with mobility to allow discharge to the venue listed below.       Follow Up Recommendations CIR;Supervision/Assistance - 24 hour(24/7 until familiar with environment (due to decr vision))    Equipment Recommendations  Cane;Rolling walker with 5" wheels(TBA )    Recommendations for Other Services Rehab consult;OT consult;Speech consult     Precautions / Restrictions Precautions Precautions: Fall      Mobility  Bed Mobility               General bed mobility comments: sitting EOB on arrival  Transfers Overall transfer level: Needs assistance Equipment used: None Transfers: Sit to/from Stand Sit to Stand: Min guard         General transfer  comment: pt with significant visual changes potentially impacting her balance  Ambulation/Gait Ambulation/Gait assistance: Min assist Gait Distance (Feet): 90 Feet Assistive device: 1 person hand held assist(reaching with free hand to feel surfaces 2/2 decr vision) Gait Pattern/deviations: Step-to pattern;Step-through pattern;Decreased stride length;Drifts right/left;Wide base of support Gait velocity: very slow due to impaired vision Gait velocity interpretation: <1.31 ft/sec, indicative of household ambulator General Gait Details: dirfits when turning her head to try to scan environment; scans with her head, not using her eyes only to scan; tilts her head sideways/forward/backward to try to get better vision  Stairs            Wheelchair Mobility    Modified Rankin (Stroke Patients Only) Modified Rankin (Stroke Patients Only) Pre-Morbid Rankin Score: No symptoms Modified Rankin: Moderately severe disability     Balance Overall balance assessment: Mild deficits observed, not formally tested;Needs assistance(primarily when drops head sideways for visual field) Sitting-balance support: No upper extremity supported;Feet supported Sitting balance-Leahy Scale: Fair     Standing balance support: No upper extremity supported Standing balance-Leahy Scale: Fair                               Pertinent Vitals/Pain Pain Assessment: No/denies pain    Home Living Family/patient expects to be discharged to:: Private residence Living Arrangements: Alone(however had gone to stay w/sister after eye shots) Available Help at Discharge: Family;Available 24 hours/day Type of Home: House Home Access: Stairs to enter Entrance Stairs-Rails: Psychiatric nurse of Steps: 5 Home Layout: One level Home Equipment: Walker - 2 wheels(is at her house not her sister's; not sure she still  has it ) Additional Comments: information is for her sister's home; walker 37+yrs old     Prior Function Level of Independence: Independent         Comments: states her bad knees have started to buckle on her; has not fallen to floor but has fallen onto bed or couch; 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      Hand Dominance   Dominant Hand: Right    Extremity/Trunk Assessment   Upper Extremity Assessment Upper Extremity Assessment: Defer to OT evaluation    Lower Extremity Assessment Lower Extremity Assessment: Generalized weakness    Cervical / Trunk Assessment Cervical / Trunk Assessment: Other exceptions Cervical / Trunk Exceptions: obese  Communication   Communication: No difficulties  Cognition Arousal/Alertness: Awake/alert Behavior During Therapy: WFL for tasks assessed/performed Overall Cognitive Status: Within Functional Limits for tasks assessed                                 General Comments: pt able to recall recent events to help her problem-solve what day of the week it was      General Comments General comments (skin integrity, edema, etc.): 142/90 pre-session    Exercises     Assessment/Plan    PT Assessment Patient needs continued PT services  PT Problem List Decreased strength;Decreased balance;Decreased mobility;Decreased knowledge of use of DME;Decreased knowledge of precautions;Obesity       PT Treatment Interventions DME instruction;Gait training;Stair training;Functional mobility training;Therapeutic activities;Therapeutic exercise;Balance training;Neuromuscular re-education;Patient/family education    PT Goals (Current goals can be found in the Care Plan section)  Acute Rehab PT Goals Patient Stated Goal: be able to see and read to return to work PT Goal Formulation: With patient Time For Goal Achievement: 07/17/19 Potential to Achieve Goals: Good    Frequency Min 4X/week   Barriers to discharge Decreased caregiver support(sister available, however also assists community  members)      Co-evaluation               AM-PAC PT "6 Clicks" Mobility  Outcome Measure Help needed turning from your back to your side while in a flat bed without using bedrails?: None Help needed moving from lying on your back to sitting on the side of a flat bed without using bedrails?: A Little Help needed moving to and from a bed to a chair (including a wheelchair)?: A Little Help needed standing up from a chair using your arms (e.g., wheelchair or bedside chair)?: A Little Help needed to walk in hospital room?: A Lot Help needed climbing 3-5 steps with a railing? : A Little 6 Click Score: 18    End of Session Equipment Utilized During Treatment: Gait belt Activity Tolerance: Patient tolerated treatment well Patient left: in chair;with call bell/phone within reach Nurse Communication: Mobility status PT Visit Diagnosis: Other abnormalities of gait and mobility (R26.89);Muscle weakness (generalized) (M62.81)    Time: [id number] PT Time Calculation (min) (ACUTE ONLY): 56 min   Charges:   PT Evaluation $PT Eval Moderate Complexity: 1 Mod PT Treatments $Gait Training: 8-22 mins $Therapeutic Activity: 8-22 mins $Self Care/Home Management: 8-22          Barry Brunner, PT      Rexanne Mano 07/03/2019, 9:39 AM

## 2019-07-03 NOTE — Progress Notes (Addendum)
PROGRESS NOTE    Meagan [name]  [id number] DOB: 24-Aug-1951 DOA: 07/01/2019 PCP: Steele Sizer, MD   Brief Narrative: 68 year old with past medical history significant for diabetes type 2, hypertension, hyperlipidemia, carotid artery stenosis presenting to the hospital for evaluation of high blood pressure and headache.  Patient has not been taking her medications as prescribed.  She reports having right-sided occipital headache for the past few days.  She was seen by an eye specialist who told her that the pressure in her eyes was high and had given her an injection.  She continued to have headache and about 3 days ago noticed that she lost her peripheral vision.  Evaluation in the ED patient was found to have an acute infarct in the right parietal-occipital junction 5 x 2 x 3 cm.  MRA of the head and neck without evidence of large vessel occlusion.    Assessment & Plan:   Principal Problem:   Acute CVA (cerebrovascular accident) [facility]) Active Problems:   Hypertension, benign   Type 2 diabetes, uncontrolled, with neuropathy (HCC)   Leukocytosis   Polycythemia     1-Acute CVA, right parietal occipital junction; --Patient with resultant hemianopia and mild right-sided weakness -Started on dual antiplatelet therapy aspirin and Plavix. -CTA neck:45% diameter stenosis proximal right internal carotid artery. 60% diameter stenosis right external carotid artery 30% diameter stenosis distal left common carotid artery. Leftinternal carotid artery mildly disease without significant stenosis. -Follow stroke team recommendation.  Stroke team recommending aspirin and Plavix for 3 months, thereafter Plavix alone -Permissive  hypertension -Hydration for concentration. -echo normal ejection fraction, diastolic dysfunction -Needs better diabetic control hemoglobin A1c at 10. -LDL 199 started on Lipitor  2-Hypertension: Permissive hypertension Started blood pressure in the 140s.  Will hold  Norvasc.  3-Hemoconcentration: Patient report poor oral intake.   Improved with IV fluids.  Vomiting has decreased to 14.  4-diabetes, hyperglycemia uncontrolled: Continue with Lantus.  Diet education  5-hypokalemia: Replete orally.  Estimated body mass index is 33.76 kg/m as calculated from the following:   Height as of 10/28/18: 5\' 5"  (1.651 m).   Weight as of 10/28/18: 92 kg.   DVT prophylaxis: Lovenox Code Status: Full code Family Communication: Discussed with patient Disposition Plan: Patient was evaluated by PT OT who are recommending CIR due to patient neurological deficit Consultants:   Neurology  Procedures:   Echo pending  Antimicrobials:  None  Subjective: Patient is alert and oriented x3 she reports that her vision loss is the same. She is willing to work on her diet and to work with physical therapist and occupational therapist  Objective: Vitals:   07/02/19 1702 07/02/19 2011 07/02/19 2336 07/03/19 0337  BP: (!) 158/99 (!) 157/106 (!) 114/59 (!) 147/90  Pulse: 81 84 73 72  Resp: 18 18 18 18   Temp: 97.7 F (36.5 C) 97.6 F (36.4 C) 97.8 F (36.6 C) (!) 97.5 F (36.4 C)  TempSrc: Oral Oral Oral Oral  SpO2: 99% 99% 100% 99%    Intake/Output Summary (Last 24 hours) at 07/03/2019 [id number] Last data filed at 07/03/2019 0500 Gross per 24 hour  Intake 1422.12 ml  Output 550 ml  Net 872.12 ml   There were no vitals filed for this visit.  Examination:  General exam: No acute distress Respiratory system: Clear to auscultation Cardiovascular system: S1, S2 regular rhythm and rate Gastrointestinal system: Bowel sounds present, soft nontender nondistended Central nervous system: Alert and oriented, left homonymous hemianopia Extremities: No edema  Data Reviewed: I have personally reviewed following labs and imaging studies  CBC: Recent Labs  Lab 07/01/19 1204 07/02/19 0411 07/03/19 0318  WBC 11.9* 11.1* 9.5  HGB 16.5* 16.2* 14.6  HCT 46.8*  47.4* 43.0  MCV 86.7 87.6 87.8  PLT 297 329 [id number]   Basic Metabolic Panel: Recent Labs  Lab 07/01/19 1204 07/03/19 0318  NA 132* 138  K 3.9 2.9*  CL 93* 100  CO2 25 28  GLUCOSE 316* 136*  BUN 11 14  CREATININE 0.82 0.80  CALCIUM 9.0 8.6*   GFR: CrCl cannot be calculated (Unknown ideal weight.). Liver Function Tests: Recent Labs  Lab 07/01/19 1204  AST 17  ALT 18  ALKPHOS 126  BILITOT 0.7  PROT 7.7  ALBUMIN 3.1*   Recent Labs  Lab 07/01/19 1204  LIPASE 33   No results for input(s): AMMONIA in the last 168 hours. Coagulation Profile: No results for input(s): INR, PROTIME in the last 168 hours. Cardiac Enzymes: No results for input(s): CKTOTAL, CKMB, CKMBINDEX, TROPONINI in the last 168 hours. BNP (last 3 results) No results for input(s): PROBNP in the last 8760 hours. HbA1C: Recent Labs    07/02/19 0411  HGBA1C 10.1*   CBG: Recent Labs  Lab 07/02/19 1238 07/02/19 1705 07/02/19 2023 07/02/19 2334 07/03/19 0654  GLUCAP 226* 266* 227* 179* 156*   Lipid Profile: Recent Labs    07/02/19 0411  CHOL 267*  HDL 32*  LDLCALC 199*  TRIG 182*  CHOLHDL 8.3   Thyroid Function Tests: No results for input(s): TSH, T4TOTAL, FREET4, T3FREE, THYROIDAB in the last 72 hours. Anemia Panel: No results for input(s): VITAMINB12, FOLATE, FERRITIN, TIBC, IRON, RETICCTPCT in the last 72 hours. Sepsis Labs: No results for input(s): PROCALCITON, LATICACIDVEN in the last 168 hours.  Recent Results (from the past 240 hour(s))  SARS CORONAVIRUS 2 (TAT 6-24 HRS) Nasopharyngeal Nasopharyngeal Swab     Status: None   Collection Time: 07/01/19  6:59 PM   Specimen: Nasopharyngeal Swab  Result Value Ref Range Status   SARS Coronavirus 2 NEGATIVE NEGATIVE Final    Comment: (NOTE) SARS-CoV-2 target nucleic acids are NOT DETECTED. The SARS-CoV-2 RNA is generally detectable in upper and lower respiratory specimens during the acute phase of infection. Negative results do not  preclude SARS-CoV-2 infection, do not rule out co-infections with other pathogens, and should not be used as the sole basis for treatment or other patient management decisions. Negative results must be combined with clinical observations, patient history, and epidemiological information. The expected result is Negative. Fact Sheet for Patients: SugarRoll.be Fact Sheet for Healthcare Providers: https://www.woods-mathews.com/ This test is not yet approved or cleared by the [place] FDA and  has been authorized for detection and/or diagnosis of SARS-CoV-2 by FDA under an Emergency Use Authorization (EUA). This EUA will remain  in effect (meaning this test can be used) for the duration of the COVID-19 declaration under Section 56 4(b)(1) of the Act, 21 U.S.C. section 360bbb-3(b)(1), unless the authorization is terminated or revoked sooner. Performed at [facility], [place] [address] [place], [place] [address]          Radiology Studies: Ct Angio Neck W Or Wo Contrast  Result Date: 07/02/2019 CLINICAL DATA:  Stroke.  Right occipital infarct. EXAM: CT ANGIOGRAPHY NECK TECHNIQUE: Multidetector CT imaging of the neck was performed using the standard protocol during bolus administration of intravenous contrast. Multiplanar CT image reconstructions and MIPs were obtained to evaluate the vascular anatomy. Carotid stenosis  measurements (when applicable) are obtained utilizing NASCET criteria, using the distal internal carotid diameter as the denominator. CONTRAST:  29mL OMNIPAQUE IOHEXOL 350 MG/ML SOLN COMPARISON:  MRI head 07/01/2019 FINDINGS: Aortic arch: Standard branching. Imaged portion shows no evidence of aneurysm or dissection. No significant stenosis of the major arch vessel origins. Right carotid system: Atherosclerotic calcified plaque right carotid bifurcation. Right internal carotid artery narrowed to 2.5 mm corresponding to 45%  diameter stenosis. Approximately 60% diameter stenosis proximal right external carotid artery. Left carotid system: Atherosclerotic disease distal left common carotid artery narrowing the lumen by 30% diameter stenosis. Atherosclerotic calcification proximal left internal carotid artery without significant stenosis. Vertebral arteries: Left vertebral artery dominant. Widely patent to the basilar. Non dominant right vertebral artery widely patent to the basilar without stenosis. Skeleton: Mild cervical spine degenerative change. No acute skeletal abnormality. Other neck: Negative for mass or adenopathy. Upper chest: Negative IMPRESSION: 1. 45% diameter stenosis proximal right internal carotid artery. 60% diameter stenosis right external carotid artery 2. 30% diameter stenosis distal left common carotid artery. Left internal carotid artery mildly disease without significant stenosis 3. Both vertebral arteries are patent to the basilar without stenosis. Electronically Signed   By: Franchot Gallo M.D.   On: 07/02/2019 09:52   Mr Angio Head Wo Contrast  Result Date: 07/01/2019 CLINICAL DATA:  Acute severe headache. Arterial dissection suspected. Possible temporal arteritis. EXAM: MRI HEAD WITHOUT CONTRAST MRA HEAD WITHOUT CONTRAST MRA NECK WITHOUT CONTRAST TECHNIQUE: Multiplanar, multiecho pulse sequences of the brain and surrounding structures were obtained without intravenous contrast. Angiographic images of the Circle of Willis were obtained using MRA technique without intravenous contrast. Angiographic images of the neck were obtained using MRA technique without intravenous contrast. Carotid stenosis measurements (when applicable) are obtained utilizing NASCET criteria, using the distal internal carotid diameter as the denominator. COMPARISON:  12/19/2006 FINDINGS: MRI HEAD FINDINGS Brain: Diffusion imaging shows acute infarction at the parieto-occipital junction region on the right affecting an area measuring  approximately 5 x 2 x 3 cm. Mild swelling but no visible hemorrhage. No other acute finding. There chronic small-vessel ischemic changes of the pons. No focal cerebellar insult. Cerebral hemispheres show moderate chronic small-vessel ischemic changes throughout the deep and subcortical hemispheric white matter. No mass lesion, hydrocephalus or extra-axial collection. Vascular: Major vessels at the base of the brain show flow. Skull and upper cervical spine: Negative Sinuses/Orbits: Clear/normal Other: None MRA HEAD FINDINGS Both internal carotid arteries are widely patent into the brain. No siphon stenosis. The anterior and middle cerebral vessels are patent without proximal stenosis, aneurysm or vascular malformation. Both vertebral arteries are widely patent to the basilar. No basilar stenosis. Posterior circulation branch vessels show flow. There is atherosclerotic narrowing and irregularity of the PCA branches, with a focal stenosis at the right P1 P2 junction. MRA NECK FINDINGS The noncontrast exam is technically deficient but the patient could not tolerate repeat. It can be stated that there is antegrade flow in both vertebral arteries with the left being dominant. Both vertebral arteries reach the basilar. No basilar stenosis is seen of the proximal vessel. Little information can be obtained regarding the carotid arteries in the neck. Both common carotid arteries do show antegrade flow and both internal carotid arteries do appear to be patent. IMPRESSION: Acute infarction at the right parieto-occipital junction affecting a region of brain measuring about 5 x 2 x 3 cm. No evidence of hemorrhage. Moderate chronic small-vessel ischemic changes elsewhere affecting the pons in the cerebral hemispheric white  matter. Intracranial MR angiography does not show any large or medium vessel occlusion. There is distal vessel atherosclerotic irregularity in both posterior cerebral artery branches, including a focal stenosis  at the right P1 P2 junction. No significant neck vessel disease suspected, but detail is quite limited due to technical deficiency of the noncontrast neck MRA. The patient would not allow repeat imaging. Electronically Signed   By: Nelson Chimes M.D.   On: 07/01/2019 18:27   Mr Angio Neck Wo Contrast  Result Date: 07/01/2019 CLINICAL DATA:  Acute severe headache. Arterial dissection suspected. Possible temporal arteritis. EXAM: MRI HEAD WITHOUT CONTRAST MRA HEAD WITHOUT CONTRAST MRA NECK WITHOUT CONTRAST TECHNIQUE: Multiplanar, multiecho pulse sequences of the brain and surrounding structures were obtained without intravenous contrast. Angiographic images of the Circle of Willis were obtained using MRA technique without intravenous contrast. Angiographic images of the neck were obtained using MRA technique without intravenous contrast. Carotid stenosis measurements (when applicable) are obtained utilizing NASCET criteria, using the distal internal carotid diameter as the denominator. COMPARISON:  12/19/2006 FINDINGS: MRI HEAD FINDINGS Brain: Diffusion imaging shows acute infarction at the parieto-occipital junction region on the right affecting an area measuring approximately 5 x 2 x 3 cm. Mild swelling but no visible hemorrhage. No other acute finding. There chronic small-vessel ischemic changes of the pons. No focal cerebellar insult. Cerebral hemispheres show moderate chronic small-vessel ischemic changes throughout the deep and subcortical hemispheric white matter. No mass lesion, hydrocephalus or extra-axial collection. Vascular: Major vessels at the base of the brain show flow. Skull and upper cervical spine: Negative Sinuses/Orbits: Clear/normal Other: None MRA HEAD FINDINGS Both internal carotid arteries are widely patent into the brain. No siphon stenosis. The anterior and middle cerebral vessels are patent without proximal stenosis, aneurysm or vascular malformation. Both vertebral arteries are widely  patent to the basilar. No basilar stenosis. Posterior circulation branch vessels show flow. There is atherosclerotic narrowing and irregularity of the PCA branches, with a focal stenosis at the right P1 P2 junction. MRA NECK FINDINGS The noncontrast exam is technically deficient but the patient could not tolerate repeat. It can be stated that there is antegrade flow in both vertebral arteries with the left being dominant. Both vertebral arteries reach the basilar. No basilar stenosis is seen of the proximal vessel. Little information can be obtained regarding the carotid arteries in the neck. Both common carotid arteries do show antegrade flow and both internal carotid arteries do appear to be patent. IMPRESSION: Acute infarction at the right parieto-occipital junction affecting a region of brain measuring about 5 x 2 x 3 cm. No evidence of hemorrhage. Moderate chronic small-vessel ischemic changes elsewhere affecting the pons in the cerebral hemispheric white matter. Intracranial MR angiography does not show any large or medium vessel occlusion. There is distal vessel atherosclerotic irregularity in both posterior cerebral artery branches, including a focal stenosis at the right P1 P2 junction. No significant neck vessel disease suspected, but detail is quite limited due to technical deficiency of the noncontrast neck MRA. The patient would not allow repeat imaging. Electronically Signed   By: Nelson Chimes M.D.   On: 07/01/2019 18:27   Mr Brain Wo Contrast (neuro Protocol)  Result Date: 07/01/2019 CLINICAL DATA:  Acute severe headache. Arterial dissection suspected. Possible temporal arteritis. EXAM: MRI HEAD WITHOUT CONTRAST MRA HEAD WITHOUT CONTRAST MRA NECK WITHOUT CONTRAST TECHNIQUE: Multiplanar, multiecho pulse sequences of the brain and surrounding structures were obtained without intravenous contrast. Angiographic images of the Circle of Willis were obtained  using MRA technique without intravenous  contrast. Angiographic images of the neck were obtained using MRA technique without intravenous contrast. Carotid stenosis measurements (when applicable) are obtained utilizing NASCET criteria, using the distal internal carotid diameter as the denominator. COMPARISON:  12/19/2006 FINDINGS: MRI HEAD FINDINGS Brain: Diffusion imaging shows acute infarction at the parieto-occipital junction region on the right affecting an area measuring approximately 5 x 2 x 3 cm. Mild swelling but no visible hemorrhage. No other acute finding. There chronic small-vessel ischemic changes of the pons. No focal cerebellar insult. Cerebral hemispheres show moderate chronic small-vessel ischemic changes throughout the deep and subcortical hemispheric white matter. No mass lesion, hydrocephalus or extra-axial collection. Vascular: Major vessels at the base of the brain show flow. Skull and upper cervical spine: Negative Sinuses/Orbits: Clear/normal Other: None MRA HEAD FINDINGS Both internal carotid arteries are widely patent into the brain. No siphon stenosis. The anterior and middle cerebral vessels are patent without proximal stenosis, aneurysm or vascular malformation. Both vertebral arteries are widely patent to the basilar. No basilar stenosis. Posterior circulation branch vessels show flow. There is atherosclerotic narrowing and irregularity of the PCA branches, with a focal stenosis at the right P1 P2 junction. MRA NECK FINDINGS The noncontrast exam is technically deficient but the patient could not tolerate repeat. It can be stated that there is antegrade flow in both vertebral arteries with the left being dominant. Both vertebral arteries reach the basilar. No basilar stenosis is seen of the proximal vessel. Little information can be obtained regarding the carotid arteries in the neck. Both common carotid arteries do show antegrade flow and both internal carotid arteries do appear to be patent. IMPRESSION: Acute infarction at the  right parieto-occipital junction affecting a region of brain measuring about 5 x 2 x 3 cm. No evidence of hemorrhage. Moderate chronic small-vessel ischemic changes elsewhere affecting the pons in the cerebral hemispheric white matter. Intracranial MR angiography does not show any large or medium vessel occlusion. There is distal vessel atherosclerotic irregularity in both posterior cerebral artery branches, including a focal stenosis at the right P1 P2 junction. No significant neck vessel disease suspected, but detail is quite limited due to technical deficiency of the noncontrast neck MRA. The patient would not allow repeat imaging. Electronically Signed   By: Nelson Chimes M.D.   On: 07/01/2019 18:27        Scheduled Meds:  aspirin  325 mg Oral Daily   atorvastatin  80 mg Oral q1800   clopidogrel  75 mg Oral Daily   enoxaparin (LOVENOX) injection  40 mg Subcutaneous Q24H   insulin aspart  0-5 Units Subcutaneous QHS   insulin aspart  0-9 Units Subcutaneous TID WC   insulin glargine  20 Units Subcutaneous Daily   potassium chloride  40 mEq Oral Q4H   Continuous Infusions:    LOS: 2 days    Time spent: 35 minutes.     Elmarie Shiley, MD [facility] Pager [phone]  If 7PM-7AM, please contact night-coverage [email or web address] Password [facility] 07/03/2019, 9:18 AM

## 2019-07-04 LAB — BASIC METABOLIC PANEL
Anion gap: 7 (ref 5–15)
BUN: 13 mg/dL (ref 8–23)
CO2: 28 mmol/L (ref 22–32)
Calcium: 8.7 mg/dL — ABNORMAL LOW (ref 8.9–10.3)
Chloride: 103 mmol/L (ref 98–111)
Creatinine, Ser: 0.79 mg/dL (ref 0.44–1.00)
GFR calc Af Amer: >60 mL/min (ref >60)
GFR calc non Af Amer: >60 mL/min (ref >60)
Glucose, Bld: 223 mg/dL — ABNORMAL HIGH (ref 70–99)
Potassium: 4.3 mmol/L (ref 3.5–5.1)
Sodium: 138 mmol/L (ref 135–145)

## 2019-07-04 LAB — GLUCOSE, CAPILLARY
Glucose-Capillary: 279 mg/dL — ABNORMAL HIGH (ref 70–99)
Glucose-Capillary: 200 mg/dL — ABNORMAL HIGH (ref 70–99)
Glucose-Capillary: 228 mg/dL — ABNORMAL HIGH (ref 70–99)

## 2019-07-04 MED ORDER — BISACODYL 10 MG RE SUPP
10.0000 mg | Freq: Once | RECTAL | Status: AC
  Administered 2019-07-04: 10 mg via RECTAL
  Filled 2019-07-04: qty 1

## 2019-07-04 MED ORDER — IRBESARTAN 150 MG PO TABS
75.0000 mg | ORAL_TABLET | Freq: Every day | ORAL | Status: DC
  Administered 2019-07-04 – 2019-07-08 (×5): 75 mg via ORAL
  Filled 2019-07-04 (×5): qty 1

## 2019-07-04 MED ORDER — AMLODIPINE BESYLATE 5 MG PO TABS
5.0000 mg | ORAL_TABLET | Freq: Every day | ORAL | Status: DC
  Administered 2019-07-04 – 2019-07-08 (×5): 5 mg via ORAL
  Filled 2019-07-04 (×5): qty 1

## 2019-07-04 MED ORDER — POLYVINYL ALCOHOL 1.4 % OP SOLN
1.0000 [drp] | OPHTHALMIC | Status: DC | PRN
  Administered 2019-07-04 – 2019-07-05 (×2): 1 [drp] via OPHTHALMIC
  Filled 2019-07-04: qty 15

## 2019-07-04 MED ORDER — POLYETHYLENE GLYCOL 3350 17 G PO PACK
17.0000 g | PACK | Freq: Two times a day (BID) | ORAL | Status: DC
  Administered 2019-07-04 – 2019-07-08 (×7): 17 g via ORAL
  Filled 2019-07-04 (×9): qty 1

## 2019-07-04 MED ORDER — TRAMADOL HCL 50 MG PO TABS
50.0000 mg | ORAL_TABLET | Freq: Four times a day (QID) | ORAL | Status: DC | PRN
  Administered 2019-07-04 – 2019-07-08 (×6): 50 mg via ORAL
  Filled 2019-07-04 (×8): qty 1

## 2019-07-04 MED ORDER — ONDANSETRON HCL 4 MG/2ML IJ SOLN
4.0000 mg | Freq: Four times a day (QID) | INTRAMUSCULAR | Status: DC | PRN
  Administered 2019-07-04: 4 mg via INTRAVENOUS
  Filled 2019-07-04: qty 2

## 2019-07-04 MED ORDER — SENNA 8.6 MG PO TABS
1.0000 | ORAL_TABLET | Freq: Every day | ORAL | Status: DC
  Administered 2019-07-04: 8.6 mg via ORAL
  Filled 2019-07-04: qty 1

## 2019-07-04 MED ORDER — INSULIN GLARGINE 100 UNIT/ML ~~LOC~~ SOLN
25.0000 [IU] | Freq: Every day | SUBCUTANEOUS | Status: DC
  Administered 2019-07-05 – 2019-07-08 (×4): 25 [IU] via SUBCUTANEOUS
  Filled 2019-07-04 (×4): qty 0.25

## 2019-07-04 MED ORDER — SENNA 8.6 MG PO TABS
1.0000 | ORAL_TABLET | Freq: Two times a day (BID) | ORAL | Status: DC
  Administered 2019-07-05 – 2019-07-08 (×7): 8.6 mg via ORAL
  Filled 2019-07-04 (×8): qty 1

## 2019-07-04 NOTE — Progress Notes (Addendum)
PROGRESS NOTE    CAMIYAH [name]  [id number] DOB: [birth year] DOA: 07/01/2019 PCP: Steele Sizer, MD   Brief Narrative: 68 year old with past medical history significant for diabetes type 2, hypertension, hyperlipidemia, carotid artery stenosis presenting to the hospital for evaluation of high blood pressure and headache.  Patient has not been taking her medications as prescribed.  She reports having right-sided occipital headache for the past few days.  She was seen by an eye specialist who told her that the pressure in her eyes was high and had given her an injection.  She continued to have headache and about 3 days ago noticed that she lost her peripheral vision.  Evaluation in the ED patient was found to have an acute infarct in the right parietal-occipital junction 5 x 2 x 3 cm.  MRA of the head and neck without evidence of large vessel occlusion.   Assessment & Plan:   Principal Problem:   Acute CVA (cerebrovascular accident) [facility]) Active Problems:   Hypertension, benign   Type 2 diabetes, uncontrolled, with neuropathy (HCC)   Leukocytosis   Polycythemia   1-Acute CVA, right parietal occipital junction; --Patient with resultant hemianopia and mild right-sided weakness -Started on dual antiplatelet therapy aspirin and Plavix. -CTA neck:45% diameter stenosis proximal right internal carotid artery. 60% diameter stenosis right external carotid artery 30% diameter stenosis distal left common carotid artery. Leftinternal carotid artery mildly disease without significant stenosis. -Follow stroke team recommendation.  Stroke team recommending aspirin and Plavix for 3 months, thereafter Plavix alone -Hydration for concentration. -echo normal ejection fraction, diastolic dysfunction -Needs better diabetic control hemoglobin A1c at 10. -LDL 199 started on Lipitor  2-Hypertension: Permissive hypertension BP increasing. Will resume home dose norvasc today.  Will resume omelsartan  tomorrow if BP remain elevated.   3-Hemoconcentration: Patient report poor oral intake.   Improved with IV fluids.  Hb  has decreased to 14.  4-Diabetes, hyperglycemia uncontrolled: Continue with Lantus. Increase dose.  Diet education  5-hypokalemia: Replete orally. 6-headache; tramadol prn.   Estimated body mass index is 33.76 kg/m as calculated from the following:   Height as of 10/28/18: 5\' 5"  (1.651 m).   Weight as of 10/28/18: 92 kg.   DVT prophylaxis: Lovenox Code Status: Full code Family Communication: Discussed with patient Disposition Plan: she will required SNF, start BP medications, adjust lantus.  Consultants:   Neurology  Procedures:   Echo normal EF, diastolic dysfunction.   Antimicrobials:  None  Subjective: She report feeling ok, vision loss stable.   Objective: Vitals:   07/03/19 2344 07/04/19 0315 07/04/19 0803 07/04/19 1220  BP: (!) 156/91 (!) 158/93 (!) 181/95 (!) 167/100  Pulse: 83 80 81 81  Resp: 18 18 17 17   Temp: 97.6 F (36.4 C) 98.4 F (36.9 C) (!) 97.5 F (36.4 C) 97.7 F (36.5 C)  TempSrc:  Oral Oral Oral  SpO2: 94% 100% 97% 100%    Intake/Output Summary (Last 24 hours) at 07/04/2019 1246 Last data filed at 07/04/2019 0300 Gross per 24 hour  Intake 782 ml  Output 700 ml  Net 82 ml   There were no vitals filed for this visit.  Examination:  General exam: NAD Respiratory system; CTA Cardiovascular system: S1, S 2 RRR Gastrointestinal system: BS present, soft, nt Central nervous system: alert and oriented.  left homonymous hemianopia Extremities: No edema    Data Reviewed: I have personally reviewed following labs and imaging studies  CBC: Recent Labs  Lab 07/01/19 1204 07/02/19 0411 07/03/19 [id number]  WBC 11.9* 11.1* 9.5  HGB 16.5* 16.2* 14.6  HCT 46.8* 47.4* 43.0  MCV 86.7 87.6 87.8  PLT 297 329 [id number]   Basic Metabolic Panel: Recent Labs  Lab 07/01/19 1204 07/03/19 0318 07/04/19 0748  NA 132* 138 138  K 3.9  2.9* 4.3  CL 93* 100 103  CO2 25 28 28   GLUCOSE 316* 136* 223*  BUN 11 14 13   CREATININE 0.82 0.80 0.79  CALCIUM 9.0 8.6* 8.7*   GFR: CrCl cannot be calculated (Unknown ideal weight.). Liver Function Tests: Recent Labs  Lab 07/01/19 1204  AST 17  ALT 18  ALKPHOS 126  BILITOT 0.7  PROT 7.7  ALBUMIN 3.1*   Recent Labs  Lab 07/01/19 1204  LIPASE 33   No results for input(s): AMMONIA in the last 168 hours. Coagulation Profile: No results for input(s): INR, PROTIME in the last 168 hours. Cardiac Enzymes: No results for input(s): CKTOTAL, CKMB, CKMBINDEX, TROPONINI in the last 168 hours. BNP (last 3 results) No results for input(s): PROBNP in the last 8760 hours. HbA1C: Recent Labs    07/02/19 0411  HGBA1C 10.1*   CBG: Recent Labs  Lab 07/02/19 2334 07/03/19 0654 07/03/19 1233 07/03/19 1710 07/04/19 1212  GLUCAP 179* 156* 224* 225* 279*   Lipid Profile: Recent Labs    07/02/19 0411  CHOL 267*  HDL 32*  LDLCALC 199*  TRIG 182*  CHOLHDL 8.3   Thyroid Function Tests: No results for input(s): TSH, T4TOTAL, FREET4, T3FREE, THYROIDAB in the last 72 hours. Anemia Panel: No results for input(s): VITAMINB12, FOLATE, FERRITIN, TIBC, IRON, RETICCTPCT in the last 72 hours. Sepsis Labs: No results for input(s): PROCALCITON, LATICACIDVEN in the last 168 hours.  Recent Results (from the past 240 hour(s))  SARS CORONAVIRUS 2 (TAT 6-24 HRS) Nasopharyngeal Nasopharyngeal Swab     Status: None   Collection Time: 07/01/19  6:59 PM   Specimen: Nasopharyngeal Swab  Result Value Ref Range Status   SARS Coronavirus 2 NEGATIVE NEGATIVE Final    Comment: (NOTE) SARS-CoV-2 target nucleic acids are NOT DETECTED. The SARS-CoV-2 RNA is generally detectable in upper and lower respiratory specimens during the acute phase of infection. Negative results do not preclude SARS-CoV-2 infection, do not rule out co-infections with other pathogens, and should not be used as the sole  basis for treatment or other patient management decisions. Negative results must be combined with clinical observations, patient history, and epidemiological information. The expected result is Negative. Fact Sheet for Patients: SugarRoll.be Fact Sheet for Healthcare Providers: https://www.woods-mathews.com/ This test is not yet approved or cleared by the [place] FDA and  has been authorized for detection and/or diagnosis of SARS-CoV-2 by FDA under an Emergency Use Authorization (EUA). This EUA will remain  in effect (meaning this test can be used) for the duration of the COVID-19 declaration under Section 56 4(b)(1) of the Act, 21 U.S.C. section 360bbb-3(b)(1), unless the authorization is terminated or revoked sooner. Performed at [facility], [place] [address], [place], [place] [address]          Radiology Studies: No results found.      Scheduled Meds: . amLODipine  5 mg Oral Daily  . aspirin  325 mg Oral Daily  . atorvastatin  80 mg Oral q1800  . clopidogrel  75 mg Oral Daily  . enoxaparin (LOVENOX) injection  40 mg Subcutaneous Q24H  . insulin aspart  0-5 Units Subcutaneous QHS  . insulin aspart  0-9 Units Subcutaneous TID WC  .  insulin glargine  20 Units Subcutaneous Daily  . polyethylene glycol  17 g Oral BID  . senna  1 tablet Oral Daily   Continuous Infusions:    LOS: 3 days    Time spent: 35 minutes.     Elmarie Shiley, MD [facility] Pager [phone]  If 7PM-7AM, please contact night-coverage [email or web address] Password [facility] 07/04/2019, 12:46 PM

## 2019-07-04 NOTE — Progress Notes (Signed)
PT Cancellation Note  Patient Details Name: Meagan [name] MRN: [id number] DOB: 1951-06-24   Cancelled Treatment:    Reason Eval/Treat Not Completed: Medical issues which prohibited therapy.  Pt is running very high systolic and diastolic BP numbers, and per nsg needs to wait for PT to mobilize.  Retry at another time.   Ramond Dial 07/04/2019, 3:41 PM   Mee Hives, PT MS Acute Rehab Dept. Number: [place]

## 2019-07-05 LAB — GLUCOSE, CAPILLARY
Glucose-Capillary: 126 mg/dL — ABNORMAL HIGH (ref 70–99)
Glucose-Capillary: 217 mg/dL — ABNORMAL HIGH (ref 70–99)
Glucose-Capillary: 183 mg/dL — ABNORMAL HIGH (ref 70–99)
Glucose-Capillary: 226 mg/dL — ABNORMAL HIGH (ref 70–99)

## 2019-07-05 MED ORDER — HYDROCHLOROTHIAZIDE 25 MG PO TABS
25.0000 mg | ORAL_TABLET | Freq: Every day | ORAL | Status: DC
  Administered 2019-07-05 – 2019-07-08 (×4): 25 mg via ORAL
  Filled 2019-07-05 (×4): qty 1

## 2019-07-05 MED ORDER — ONDANSETRON HCL 4 MG PO TABS
4.0000 mg | ORAL_TABLET | Freq: Four times a day (QID) | ORAL | Status: DC | PRN
  Administered 2019-07-05: 20:00:00 4 mg via ORAL
  Filled 2019-07-05: qty 1

## 2019-07-05 MED ORDER — ONDANSETRON HCL 4 MG/2ML IJ SOLN: 4.0000 mg | Freq: Four times a day (QID) | INTRAMUSCULAR | Status: DC | PRN

## 2019-07-05 NOTE — TOC Initial Note (Signed)
Transition of Care [facility]) - Initial/Assessment Note    Patient Details  Name: Meagan [name] MRN: [id number] Date of Birth: Jun 30, 1951  Transition of Care [facility]) CM/SW Contact:    Gelene Mink, [place] Phone Number: 07/05/2019, 3:08 PM  Clinical Narrative:                  CSW met with the patient at bedside. CSW introduced herself and explained her role. CSW asked if she could speak with the patient with her sister present, the patient agreed. The patient is agreeable going to rehab before returning home. The patient's family is in support of her going to rehab. They do not have any preferences; they did provide [employer] and [employer] as places of interest. CSW obtained permission to fax the patient out and provide bed offers.   CSW provided a copy of the CMS List to the patient's sister. CSW will provide bed offers as they come available.   Expected Discharge Plan: Skilled Nursing Facility Barriers to Discharge: Continued Medical Work up, Ship broker   Patient Goals and CMS Choice Patient states their goals for this hospitalization and ongoing recovery are:: Pt is agreeable to rehab CMS Medicare.gov Compare Post Acute Care list provided to:: Patient Choice offered to / list presented to : Patient  Expected Discharge Plan and Services Expected Discharge Plan: [place] In-house Referral: Clinical Social Work Discharge Planning Services: NA Post Acute Care Choice: [place] Living arrangements for the past 2 months: Single Family Home                 DME Arranged: N/A DME Agency: NA       HH Arranged: NA [place] Agency: NA        Prior Living Arrangements/Services Living arrangements for the past 2 months: [place] Lives with:: Self Patient language and need for interpreter reviewed:: No Do you feel safe going back to the place where you live?: No   Pt feels that she needs rehab  Need for Family Participation in  Patient Care: No (Comment) Care giver support system in place?: Yes (comment)   Criminal Activity/Legal Involvement Pertinent to Current Situation/Hospitalization: No - Comment as needed  Activities of Daily Living Home Assistive Devices/Equipment: None ADL Screening (condition at time of admission) Patient's cognitive ability adequate to safely complete daily activities?: Yes Is the patient deaf or have difficulty hearing?: No Does the patient have difficulty seeing, even when wearing glasses/contacts?: Yes Does the patient have difficulty concentrating, remembering, or making decisions?: No Patient able to express need for assistance with ADLs?: Yes Does the patient have difficulty dressing or bathing?: No Independently performs ADLs?: Yes (appropriate for developmental age) Does the patient have difficulty walking or climbing stairs?: No Weakness of Legs: None Weakness of Arms/Hands: None  Permission Sought/Granted Permission sought to share information with : Case Manager Permission granted to share information with : Yes, Verbal Permission Granted  Share Information with NAME: Anderson Malta and Nellie  Permission granted to share info w AGENCY: All SNF  Permission granted to share info w Relationship: Daughter and sister     Emotional Assessment Appearance:: Appears stated age Attitude/Demeanor/Rapport: Engaged Affect (typically observed): Calm Orientation: : Oriented to Self, Oriented to Place, Oriented to  Time, Oriented to Situation Alcohol / Substance Use: Not Applicable Psych Involvement: No (comment)  Admission diagnosis:  Acute CVA (cerebrovascular accident) [facility]) [I63.9] Patient Active Problem List   Diagnosis Date Noted  . Acute CVA (  cerebrovascular accident) ([place]) 07/01/2019  . Leukocytosis 07/01/2019  . Polycythemia 07/01/2019  . Morbid obesity ([place]) 10/22/2018  . Non compliance with medical treatment 03/26/2018  . Dyslipidemia associated with type 2 diabetes  mellitus ([place]) 12/04/2016  . Uncontrolled type 2 diabetes mellitus with microalbuminuria, without long-term current use of insulin ([place]) 12/04/2016  . Seasonal allergies 02/01/2016  . OSA (obstructive sleep apnea) 10/06/2015  . Hypertensive retinopathy 10/06/2015  . Thyroid cyst 10/06/2015  . Dyslipidemia 10/06/2015  . Type 2 diabetes, uncontrolled, with neuropathy ([place]) 10/06/2015  . History of pneumonia 10/06/2015  . Chronic neck pain 10/06/2015  . Occlusion and stenosis of carotid artery without mention of cerebral infarction   . Hypertension, benign    PCP:  Steele Sizer, MD Pharmacy:   [id number] [place], [id number]: [phone]: [phone]    Social Determinants of Health (SDOH) Interventions    Readmission Risk Interventions No flowsheet data found.

## 2019-07-05 NOTE — NC FL2 (Signed)
[place] LEVEL OF CARE SCREENING TOOL     IDENTIFICATION  Patient Name: Meagan [name] Birthdate: [birth year] Sex: female Admission Date (Current Location): 07/01/2019  [facility] and [place] Number:  Herbalist and Address:  [facility] [place], [place] [address] [place], [place] [address]      Provider Number: [id number]  Attending Physician Name and Address:  Elmarie Shiley, MD  Relative Name and Phone Number:  Boyce Medici, Daughter, [phone]    Current Level of Care: Hospital Recommended Level of Care: [place] Prior Approval Number:    Date Approved/Denied:   PASRR Number: [id number]  Discharge Plan: SNF    Current Diagnoses: Patient Active Problem List   Diagnosis Date Noted  . Acute CVA (cerebrovascular accident) ([place]) 07/01/2019  . Leukocytosis 07/01/2019  . Polycythemia 07/01/2019  . Morbid obesity ([place]) 10/22/2018  . Non compliance with medical treatment 03/26/2018  . Dyslipidemia associated with type 2 diabetes mellitus ([place]) 12/04/2016  . Uncontrolled type 2 diabetes mellitus with microalbuminuria, without long-term current use of insulin ([place]) 12/04/2016  . Seasonal allergies 02/01/2016  . OSA (obstructive sleep apnea) 10/06/2015  . Hypertensive retinopathy 10/06/2015  . Thyroid cyst 10/06/2015  . Dyslipidemia 10/06/2015  . Type 2 diabetes, uncontrolled, with neuropathy ([place]) 10/06/2015  . History of pneumonia 10/06/2015  . Chronic neck pain 10/06/2015  . Occlusion and stenosis of carotid artery without mention of cerebral infarction   . Hypertension, benign     Orientation RESPIRATION BLADDER Height & Weight     Self, Time, Situation, Place  Normal Continent, External catheter Weight:   Height:     BEHAVIORAL SYMPTOMS/[place] NEUROLOGICAL BOWEL NUTRITION STATUS      Continent Diet(cardiac/heart healthy diet, thin liquids)  AMBULATORY STATUS COMMUNICATION OF NEEDS Skin   Limited Assist  Verbally Normal(Dry skin)                       Personal Care Assistance Level of Assistance  Bathing, Feeding, Dressing, Total care Bathing Assistance: Limited assistance Feeding assistance: Independent(Needs assistance setting up; able to feed self) Dressing Assistance: Limited assistance Total Care Assistance: Limited assistance   Functional Limitations Info  Sight, Hearing, Speech Sight Info: Impaired Hearing Info: Adequate Speech Info: Adequate    SPECIAL CARE FACTORS FREQUENCY  OT (By licensed OT), PT (By licensed PT), Speech therapy     PT Frequency: 5x/wk OT Frequency: 5x/wk     Speech Therapy Frequency: 2x/wk      Contractures Contractures Info: Not present    Additional Factors Info  Code Status, Allergies, Insulin Sliding Scale Code Status Info: Full Code Allergies Info: Farxiga (Dapagliflozin), Keflex (Cephalexin)   Insulin Sliding Scale Info: insulin aspart novolog 0-9 units 3x daily w/meals & insulin glargine (lantus) 25 units daily       Current Medications (07/05/2019):  This is the current hospital active medication list Current Facility-Administered Medications  Medication Dose Route Frequency Provider Last Rate Last Dose  . acetaminophen (TYLENOL) tablet 650 mg  650 mg Oral Q4H PRN Shela Leff, MD   650 mg at 07/03/19 1227   Or  . acetaminophen (TYLENOL) solution 650 mg  650 mg Per Tube Q4H PRN Shela Leff, MD       Or  . acetaminophen (TYLENOL) suppository 650 mg  650 mg Rectal Q4H PRN Shela Leff, MD      . amLODipine (NORVASC) tablet 5 mg  5 mg Oral Daily Regalado, Belkys A,  MD   5 mg at 07/05/19 0923  . aspirin tablet 325 mg  325 mg Oral Daily Shela Leff, MD   325 mg at 07/05/19 0923  . atorvastatin (LIPITOR) tablet 80 mg  80 mg Oral q1800 Shela Leff, MD   80 mg at 07/04/19 1724  . clopidogrel (PLAVIX) tablet 75 mg  75 mg Oral Daily Shela Leff, MD   75 mg at 07/05/19 0923  . enoxaparin (LOVENOX)  injection 40 mg  40 mg Subcutaneous Q24H Shela Leff, MD   40 mg at 07/04/19 2336  . hydrochlorothiazide (HYDRODIURIL) tablet 25 mg  25 mg Oral Daily Regalado, Belkys A, MD   25 mg at 07/05/19 1315  . insulin aspart (novoLOG) injection 0-9 Units  0-9 Units Subcutaneous TID WC Shela Leff, MD   3 Units at 07/05/19 1315  . insulin glargine (LANTUS) injection 25 Units  25 Units Subcutaneous Daily Regalado, Belkys A, MD   25 Units at 07/05/19 0923  . irbesartan (AVAPRO) tablet 75 mg  75 mg Oral Daily Regalado, Belkys A, MD   75 mg at 07/05/19 0925  . loratadine (CLARITIN) tablet 10 mg  10 mg Oral PRN Shela Leff, MD      . ondansetron [facility]) injection 4 mg  4 mg Intravenous Q6H PRN Regalado, Belkys A, MD   4 mg at 07/04/19 1510  . polyethylene glycol (MIRALAX / GLYCOLAX) packet 17 g  17 g Oral BID Regalado, Belkys A, MD   17 g at 07/05/19 0923  . polyvinyl alcohol (LIQUIFILM TEARS) 1.4 % ophthalmic solution 1 drop  1 drop Both Eyes PRN Regalado, Belkys A, MD   1 drop at 07/05/19 1317  . senna (SENOKOT) tablet 8.6 mg  1 tablet Oral BID Regalado, Belkys A, MD   8.6 mg at 07/05/19 0923  . traMADol (ULTRAM) tablet 50 mg  50 mg Oral Q6H PRN Regalado, Belkys A, MD   50 mg at 07/04/19 1044     Discharge Medications: Please see discharge summary for a list of discharge medications.  Relevant Imaging Results:  Relevant Lab Results:   Additional Information SSN: [id number]; Has newly lost ability to see  [employer], LCSWA

## 2019-07-05 NOTE — Progress Notes (Signed)
Physical Therapy Treatment Patient Details Name: Meagan [name] MRN: [id number] DOB: 1951/06/20 Today's Date: 07/05/2019    History of Present Illness 68 y.o. female with medical history significant of type 2 diabetes, hypertension, hyperlipidemia, carotid artery stenosis presented 07/01/19 to the hospital for evaluation of high blood pressure and headache.  She had not been taking her home blood pressure and diabetes medications as prescribed. 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 Left homonymous hemianopsia    PT Comments    Patient continues to need up to min assist to avoid objects when walking. Using very slow gait and stopping to scan environment. Able to increase her velocity minimally when minguard provided and pt assured PT would not let her run into anything. After ~25 minutes of activity and working to avoid objects in hall and room, she began to have incr headache. Discussed alternating periods of working on her vision and challenging her brain with periods of eyes closed and resting her brain. Continues to need further therapies prior to discharge home.     Follow Up Recommendations  CIR (?will reconsider--if not then will need SNF); Supervision/Assistance - 24 hour (CIR signed off anticipating quick return to supervision however pt is slow to progress due to degree of visual changes)     Equipment Recommendations  Cane    Recommendations for Other Services Rehab consult     Precautions / Restrictions Precautions Precautions: Fall Restrictions Weight Bearing Restrictions: No    Mobility  Bed Mobility Overal bed mobility: Modified Independent             General bed mobility comments: supine to sit using bed rail  Transfers Overall transfer level: Needs assistance Equipment used: Straight cane Transfers: Sit to/from Stand Sit to Stand: Min guard         General transfer comment: no imbalance; closeguarding for  safety  Ambulation/Gait Ambulation/Gait assistance: Min assist Gait Distance (Feet): 130 Feet Assistive device: Straight cane Gait Pattern/deviations: Step-through pattern;Decreased stride length;Drifts right/left;Wide base of support Gait velocity: very slow; able to incr on command when assured she would not run into anything and could feel minguard assist by PT Gait velocity interpretation: <1.8 ft/sec, indicate of risk for recurrent falls General Gait Details: stops to turn her head to scan environment for upcoming obstacles; vc x1 to avoid running into doorway on her left side; vc to rely on cane in right hand and not try to "feel her way" with her hands   Stairs             Wheelchair Mobility    Modified Rankin (Stroke Patients Only) Modified Rankin (Stroke Patients Only) Pre-Morbid Rankin Score: No symptoms Modified Rankin: Moderately severe disability     Balance Overall balance assessment: Mild deficits observed, not formally tested;Needs assistance Sitting-balance support: No upper extremity supported;Feet supported Sitting balance-Leahy Scale: Fair     Standing balance support: No upper extremity supported Standing balance-Leahy Scale: Fair                              Cognition Arousal/Alertness: Awake/alert Behavior During Therapy: WFL for tasks assessed/performed                                   General Comments: unclear if slow processing more cognitive or due to vision; even language is slow  Exercises      General Comments        Pertinent Vitals/Pain Pain Assessment: 0-10 Pain Score: 5  Pain Location: headache Pain Descriptors / Indicators: Aching Pain Intervention(s): Limited activity within patient's tolerance;Monitored during session;Repositioned;Other (comment)(educated on taking breaks from trying to focus on things)    Home Living                      Prior Function            PT  Goals (current goals can now be found in the care plan section) Acute Rehab PT Goals Patient Stated Goal: be able to see and read to return to work Time For Goal Achievement: 07/17/19 Potential to Achieve Goals: Good Progress towards PT goals: Progressing toward goals    Frequency    Min 4X/week(maintain 4x/week hopeful for CIR)      PT Plan Discharge plan needs to be updated    Co-evaluation              AM-PAC PT "6 Clicks" Mobility   Outcome Measure  Help needed turning from your back to your side while in a flat bed without using bedrails?: None Help needed moving from lying on your back to sitting on the side of a flat bed without using bedrails?: A Little Help needed moving to and from a bed to a chair (including a wheelchair)?: A Little Help needed standing up from a chair using your arms (e.g., wheelchair or bedside chair)?: A Little Help needed to walk in hospital room?: A Little Help needed climbing 3-5 steps with a railing? : A Little 6 Click Score: 19    End of Session Equipment Utilized During Treatment: Gait belt Activity Tolerance: Patient limited by pain(increasing headache) Patient left: with call bell/phone within reach;in bed;with bed alarm set;with family/visitor present Nurse Communication: Mobility status;Other (comment)(incr headache) PT Visit Diagnosis: Other abnormalities of gait and mobility (R26.89);Muscle weakness (generalized) (M62.81)     Time: [id number] PT Time Calculation (min) (ACUTE ONLY): 40 min  Charges:  $Gait Training: 23-37 mins $Self Care/Home Management: 8-22                       Barry Brunner, PT       Rexanne Mano 07/05/2019, 5:28 PM

## 2019-07-05 NOTE — Progress Notes (Signed)
PROGRESS NOTE    Meagan [name]  [id number] DOB: 07-09-1951 DOA: 07/01/2019 PCP: Steele Sizer, MD   Brief Narrative: 68 year old with past medical history significant for diabetes type 2, hypertension, hyperlipidemia, carotid artery stenosis presenting to the hospital for evaluation of high blood pressure and headache.  Patient has not been taking her medications as prescribed.  She reports having right-sided occipital headache for the past few days.  She was seen by an eye specialist who told her that the pressure in her eyes was high and had given her an injection.  She continued to have headache and about 3 days ago noticed that she lost her peripheral vision.  Evaluation in the ED patient was found to have an acute infarct in the right parietal-occipital junction 5 x 2 x 3 cm.  MRA of the head and neck without evidence of large vessel occlusion.   Assessment & Plan:   Principal Problem:   Acute CVA (cerebrovascular accident) [facility]) Active Problems:   Hypertension, benign   Type 2 diabetes, uncontrolled, with neuropathy (HCC)   Leukocytosis   Polycythemia   1-Acute CVA, Right parietal occipital junction; --Patient with resultant hemianopia and mild right-sided weakness -Started on dual antiplatelet therapy aspirin and Plavix. -CTA neck:45% diameter stenosis proximal right internal carotid artery. 60% diameter stenosis right external carotid artery 30% diameter stenosis distal left common carotid artery. Leftinternal carotid artery mildly disease without significant stenosis. -Follow stroke team recommendation.  Stroke team recommending aspirin and Plavix for 3 months, thereafter Plavix alone -Hydration for concentration. -echo normal ejection fraction, diastolic dysfunction -Needs better diabetic control hemoglobin A1c at 10. -LDL 199 started on Lipitor -stroke team refer patient to cardiology for Holter monitor.   2-Hypertension: Permissive hypertension Continue with  Norvasc and irbesartan. resume HCTZ today.  Check Renal function tomorrow.   3-Hemoconcentration: Patient report poor oral intake.   Improved with IV fluids.  Hb  has decreased to 14.  4-Diabetes, hyperglycemia uncontrolled: Continue with Lantus. Increase dose.  Diet education Better controlled   5-Hypokalemia: Replete orally. 6-headache; tramadol prn.   Estimated body mass index is 33.76 kg/m as calculated from the following:   Height as of 10/28/18: 5\' 5"  (1.651 m).   Weight as of 10/28/18: 92 kg.   DVT prophylaxis: Lovenox Code Status: Full code Family Communication: Discussed with patient Disposition Plan: she will required SNF, start BP medications, adjust lantus.  Consultants:   Neurology  Procedures:   Echo normal EF, diastolic dysfunction.   Antimicrobials:  None  Subjective: Vision stable. Difficulty seeing and focus. Headaches controlled with pain medications.  Had BM. No more nausea.     Objective: Vitals:   07/05/19 0003 07/05/19 0348 07/05/19 0757 07/05/19 1134  BP: (!) 161/83 (!) 166/88 (!) 142/80 (!) 157/93  Pulse: 86 81 87 80  Resp: 18 18 14 14   Temp: 98 F (36.7 C) 98.3 F (36.8 C) 98.6 F (37 C) 98.4 F (36.9 C)  TempSrc: Oral Oral Oral Oral  SpO2: 98% 99% 99% 100%    Intake/Output Summary (Last 24 hours) at 07/05/2019 1203 Last data filed at 07/05/2019 0349 Gross per 24 hour  Intake 200 ml  Output 300 ml  Net -100 ml   There were no vitals filed for this visit.  Examination:  General exam: NAD Respiratory system; CTA Cardiovascular system: S 1, S 2 RRR Gastrointestinal system: BS present, soft nt Central nervous system: Alert and orient.  left homonymous hemianopia Extremities: No edema    Data Reviewed:  I have personally reviewed following labs and imaging studies  CBC: Recent Labs  Lab 07/01/19 1204 07/02/19 0411 07/03/19 0318  WBC 11.9* 11.1* 9.5  HGB 16.5* 16.2* 14.6  HCT 46.8* 47.4* 43.0  MCV 86.7 87.6 87.8   PLT 297 329 [id number]   Basic Metabolic Panel: Recent Labs  Lab 07/01/19 1204 07/03/19 0318 07/04/19 0748  NA 132* 138 138  K 3.9 2.9* 4.3  CL 93* 100 103  CO2 25 28 28   GLUCOSE 316* 136* 223*  BUN 11 14 13   CREATININE 0.82 0.80 0.79  CALCIUM 9.0 8.6* 8.7*   GFR: CrCl cannot be calculated (Unknown ideal weight.). Liver Function Tests: Recent Labs  Lab 07/01/19 1204  AST 17  ALT 18  ALKPHOS 126  BILITOT 0.7  PROT 7.7  ALBUMIN 3.1*   Recent Labs  Lab 07/01/19 1204  LIPASE 33   No results for input(s): AMMONIA in the last 168 hours. Coagulation Profile: No results for input(s): INR, PROTIME in the last 168 hours. Cardiac Enzymes: No results for input(s): CKTOTAL, CKMB, CKMBINDEX, TROPONINI in the last 168 hours. BNP (last 3 results) No results for input(s): PROBNP in the last 8760 hours. HbA1C: No results for input(s): HGBA1C in the last 72 hours. CBG: Recent Labs  Lab 07/04/19 1212 07/04/19 1652 07/04/19 2126 07/05/19 0610 07/05/19 1136  GLUCAP 279* 200* 228* 126* 217*   Lipid Profile: No results for input(s): CHOL, HDL, LDLCALC, TRIG, CHOLHDL, LDLDIRECT in the last 72 hours. Thyroid Function Tests: No results for input(s): TSH, T4TOTAL, FREET4, T3FREE, THYROIDAB in the last 72 hours. Anemia Panel: No results for input(s): VITAMINB12, FOLATE, FERRITIN, TIBC, IRON, RETICCTPCT in the last 72 hours. Sepsis Labs: No results for input(s): PROCALCITON, LATICACIDVEN in the last 168 hours.  Recent Results (from the past 240 hour(s))  SARS CORONAVIRUS 2 (TAT 6-24 HRS) Nasopharyngeal Nasopharyngeal Swab     Status: None   Collection Time: 07/01/19  6:59 PM   Specimen: Nasopharyngeal Swab  Result Value Ref Range Status   SARS Coronavirus 2 NEGATIVE NEGATIVE Final    Comment: (NOTE) SARS-CoV-2 target nucleic acids are NOT DETECTED. The SARS-CoV-2 RNA is generally detectable in upper and lower respiratory specimens during the acute phase of infection. Negative  results do not preclude SARS-CoV-2 infection, do not rule out co-infections with other pathogens, and should not be used as the sole basis for treatment or other patient management decisions. Negative results must be combined with clinical observations, patient history, and epidemiological information. The expected result is Negative. Fact Sheet for Patients: SugarRoll.be Fact Sheet for Healthcare Providers: https://www.woods-mathews.com/ This test is not yet approved or cleared by the [place] FDA and  has been authorized for detection and/or diagnosis of SARS-CoV-2 by FDA under an Emergency Use Authorization (EUA). This EUA will remain  in effect (meaning this test can be used) for the duration of the COVID-19 declaration under Section 56 4(b)(1) of the Act, 21 U.S.C. section 360bbb-3(b)(1), unless the authorization is terminated or revoked sooner. Performed at [facility], [place] [address] [place], [place] [address]          Radiology Studies: No results found.      Scheduled Meds: . amLODipine  5 mg Oral Daily  . aspirin  325 mg Oral Daily  . atorvastatin  80 mg Oral q1800  . clopidogrel  75 mg Oral Daily  . enoxaparin (LOVENOX) injection  40 mg Subcutaneous Q24H  . hydrochlorothiazide  25 mg Oral Daily  .  insulin aspart  0-9 Units Subcutaneous TID WC  . insulin glargine  25 Units Subcutaneous Daily  . irbesartan  75 mg Oral Daily  . polyethylene glycol  17 g Oral BID  . senna  1 tablet Oral BID   Continuous Infusions:    LOS: 4 days    Time spent: 35 minutes.     Elmarie Shiley, MD [facility] Pager [phone]  If 7PM-7AM, please contact night-coverage [email or web address] Password [facility] 07/05/2019, 12:03 PM

## 2019-07-06 LAB — CBC
HCT: 38.4 % (ref 36.0–46.0)
Hemoglobin: 13.7 g/dL (ref 12.0–15.0)
MCH: 31.1 pg (ref 26.0–34.0)
MCHC: 35.7 g/dL (ref 30.0–36.0)
MCV: 87.1 fL (ref 80.0–100.0)
Platelets: 245 10*3/uL (ref 150–400)
RBC: 4.41 MIL/uL (ref 3.87–5.11)
RDW: 12.1 % (ref 11.5–15.5)
WBC: 10.2 10*3/uL (ref 4.0–10.5)
nRBC: 0 % (ref 0.0–0.2)

## 2019-07-06 LAB — GLUCOSE, CAPILLARY
Glucose-Capillary: 268 mg/dL — ABNORMAL HIGH (ref 70–99)
Glucose-Capillary: 184 mg/dL — ABNORMAL HIGH (ref 70–99)
Glucose-Capillary: 120 mg/dL — ABNORMAL HIGH (ref 70–99)
Glucose-Capillary: 135 mg/dL — ABNORMAL HIGH (ref 70–99)
Glucose-Capillary: 276 mg/dL — ABNORMAL HIGH (ref 70–99)
Glucose-Capillary: 265 mg/dL — ABNORMAL HIGH (ref 70–99)
Glucose-Capillary: 141 mg/dL — ABNORMAL HIGH (ref 70–99)

## 2019-07-06 LAB — BASIC METABOLIC PANEL
Anion gap: 9 (ref 5–15)
BUN: 10 mg/dL (ref 8–23)
CO2: 30 mmol/L (ref 22–32)
Calcium: 8.7 mg/dL — ABNORMAL LOW (ref 8.9–10.3)
Chloride: 99 mmol/L (ref 98–111)
Creatinine, Ser: 0.81 mg/dL (ref 0.44–1.00)
GFR calc Af Amer: >60 mL/min (ref >60)
GFR calc non Af Amer: >60 mL/min (ref >60)
Glucose, Bld: 138 mg/dL — ABNORMAL HIGH (ref 70–99)
Potassium: 3.3 mmol/L — ABNORMAL LOW (ref 3.5–5.1)
Sodium: 138 mmol/L (ref 135–145)

## 2019-07-06 MED ORDER — POTASSIUM CHLORIDE CRYS ER 20 MEQ PO TBCR: 20.0000 meq | EXTENDED_RELEASE_TABLET | Freq: Every day | ORAL | Status: DC

## 2019-07-06 MED ORDER — POTASSIUM CHLORIDE CRYS ER 20 MEQ PO TBCR
40.0000 meq | EXTENDED_RELEASE_TABLET | Freq: Once | ORAL | Status: AC
  Administered 2019-07-06: 40 meq via ORAL
  Filled 2019-07-06: qty 2

## 2019-07-06 NOTE — Progress Notes (Signed)
Occupational Therapy Treatment Patient Details Name: Meagan [name] MRN: [id number] DOB: 1951/03/16 Today's Date: 07/06/2019    History of present illness 68 y.o. female with medical history significant of type 2 diabetes, hypertension, hyperlipidemia, carotid artery stenosis presented 07/01/19 to the hospital for evaluation of high blood pressure and headache.  She had not been taking her home blood pressure and diabetes medications as prescribed. 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 Left homonymous hemianopsia   OT comments  Pt contacted sister [name] with therapist help to request glasses for acuity be delivered to patient. Pt noted to have additional head tilts and color changes at this time. Pt with light sensitivity keeping the room dark and declining windows to be opened. Pt states this is new since arriving to the hospital. Pt sitting in chair with breakfast this session. Pt could benefit from 7-14 day stay on CIR to help with ADLS with visual compensatory strategies.    Follow Up Recommendations  CIR    Equipment Recommendations  None recommended by OT    Recommendations for Other Services      Precautions / Restrictions Precautions Precautions: Fall       Mobility Bed Mobility Overal bed mobility: Needs Assistance Bed Mobility: Supine to Sit     Supine to sit: Supervision     General bed mobility comments: min cues for sequence exiting L side  Transfers Overall transfer level: Needs assistance   Transfers: Sit to/from Stand Sit to Stand: Min assist         General transfer comment: pt asked to transfer L and very guarded reaching for surfaces to feel them with RW. pt cued to stay with RW    Balance Overall balance assessment: Needs assistance           Standing balance-Leahy Scale: Fair                             ADL either performed or assessed with clinical judgement   ADL Overall ADL's :  Needs assistance/impaired Eating/Feeding: Sitting;Minimal assistance Eating/Feeding Details (indicate cue type and reason): working on scanning the tray for all items. pt with all food pushed to the R side of the tray to help with visual deficts. pt with head tilts and color changes present. Pt with dark room and reports this is since the stroke that light sensitive.  Grooming: Wash/dry hands;Set up;Sitting Grooming Details (indicate cue type and reason): provided hand towel in chair due to BP                                General ADL Comments: pt with resting BP elevated transfered to chair to focus on visual scanning strategies. pt requires a darken room     Vision   Vision Assessment?: Vision impaired- to be further tested in functional context Additional Comments: pt noted to have color changes, head tilts and ability to complete oculmotor movement to the L visual field and then rotating head to see. pt does a sequence of eye movement, neck rotation and then head tilts to scan in that order by her own cueing. pt reports pink as orange, white as white, green is green black blue and purple all look the same color but the darkness varies.    Perception     Praxis      Cognition Arousal/Alertness: Awake/alert Behavior  During Therapy: WFL for tasks assessed/performed Overall Cognitive Status: Impaired/Different from baseline Area of Impairment: Awareness                           Awareness: Emergent   General Comments: very slow and direct in word choice        Exercises     Shoulder Instructions       General Comments see vitals    Pertinent Vitals/ Pain       Pain Assessment: No/denies pain  Home Living                                          Prior Functioning/Environment              Frequency  Min 2X/week        Progress Toward Goals  OT Goals(current goals can now be found in the care plan section)  Progress  towards OT goals: Progressing toward goals  Acute Rehab OT Goals Patient Stated Goal: be able to see and read to return to work OT Goal Formulation: With patient Time For Goal Achievement: 07/17/19 Potential to Achieve Goals: Good ADL Goals Pt Will Perform Grooming: with modified independence;standing Pt/caregiver will Perform Home Exercise Program: With written HEP provided Additional ADL Goal #1: Pt will increase to using 3 compensatory or adaptive strategies with mobility and ADL with L sided visual deficits  Plan Discharge plan remains appropriate    Co-evaluation                 AM-PAC OT "6 Clicks" Daily Activity     Outcome Measure   Help from another person eating meals?: A Little Help from another person taking care of personal grooming?: A Little Help from another person toileting, which includes using toliet, bedpan, or urinal?: A Little Help from another person bathing (including washing, rinsing, drying)?: A Little Help from another person to put on and taking off regular upper body clothing?: None Help from another person to put on and taking off regular lower body clothing?: A Little 6 Click Score: 19    End of Session Equipment Utilized During Treatment: Rolling walker  OT Visit Diagnosis: Unsteadiness on feet (R26.81);Repeated falls (R29.6);Low vision, both eyes (H54.2)   Activity Tolerance Patient tolerated treatment well   Patient Left in chair;with call bell/phone within reach;with chair alarm set   Nurse Communication Mobility status;Precautions        Time: [phone] OT Time Calculation (min): 23 min  Charges: OT General Charges $OT Visit: 1 Visit OT Treatments $Self Care/Home Management : 8-22 mins   Jeri Modena, OTR/L  Acute Rehabilitation Services Pager: [phone] Office: [phone] .    Jeri Modena 07/06/2019, 10:38 AM

## 2019-07-06 NOTE — Progress Notes (Signed)
Physical Therapy Treatment Patient Details Name: Meagan [name] MRN: [id number] DOB: November 04, 1950 Today's Date: 07/06/2019    History of Present Illness 68 y.o. female with medical history significant of type 2 diabetes, hypertension, hyperlipidemia, carotid artery stenosis presented 07/01/19 to the hospital for evaluation of high blood pressure and headache.  She had not been taking her home blood pressure and diabetes medications as prescribed. 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 Left homonymous hemianopsia    PT Comments    Patient seen for mobility progression. Pt requires min guard/min A for OOB mobility. Continue to progress as tolerated.    Follow Up Recommendations  CIR;Supervision/Assistance - 24 hour     Equipment Recommendations  Cane    Recommendations for Other Services       Precautions / Restrictions Precautions Precautions: Fall    Mobility  Bed Mobility Overal bed mobility: Needs Assistance Bed Mobility: Supine to Sit;Sit to Supine     Supine to sit: Supervision Sit to supine: Supervision   General bed mobility comments: supervision for safety; increased time and effort   Transfers Overall transfer level: Needs assistance Equipment used: None Transfers: Sit to/from Stand Sit to Stand: Min guard         General transfer comment: min guard for safety  Ambulation/Gait Ambulation/Gait assistance: Min guard;Min assist Gait Distance (Feet): 200 Feet Assistive device: Rolling walker (2 wheeled) Gait Pattern/deviations: Step-through pattern Gait velocity: decreased   General Gait Details: slow and guarded but overall steady with UE support; pt taking L hand from RW to feel around due to impaired vision; cues for safe use of AD and pt reassured often that therapist would let her know if she was going to run into something; increased assist needed for turning to L side and maintaining safe proximity to [employer] Mobility    Modified Rankin (Stroke Patients Only)       Balance Overall balance assessment: Needs assistance   Sitting balance-Leahy Scale: Good       Standing balance-Leahy Scale: Fair                              Cognition Arousal/Alertness: Awake/alert Behavior During Therapy: WFL for tasks assessed/performed Overall Cognitive Status: Impaired/Different from baseline Area of Impairment: Awareness                           Awareness: Emergent   General Comments: very slow and direct in word choice      Exercises      General Comments        Pertinent Vitals/Pain Pain Assessment: Faces Faces Pain Scale: Hurts little more Pain Descriptors / Indicators: Headache    Home Living                      Prior Function            PT Goals (current goals can now be found in the care plan section) Acute Rehab PT Goals Patient Stated Goal: be able to see and read to return to work Progress towards PT goals: Progressing toward goals    Frequency    Min 4X/week      PT Plan Current plan remains appropriate    Co-evaluation  AM-PAC PT "6 Clicks" Mobility   Outcome Measure  Help needed turning from your back to your side while in a flat bed without using bedrails?: None Help needed moving from lying on your back to sitting on the side of a flat bed without using bedrails?: A Little Help needed moving to and from a bed to a chair (including a wheelchair)?: A Little Help needed standing up from a chair using your arms (e.g., wheelchair or bedside chair)?: A Little Help needed to walk in hospital room?: A Little Help needed climbing 3-5 steps with a railing? : A Little 6 Click Score: 19    End of Session Equipment Utilized During Treatment: Gait belt Activity Tolerance: Patient tolerated treatment well Patient left: with call bell/phone within reach;in bed;with  family/visitor present Nurse Communication: Mobility status PT Visit Diagnosis: Other abnormalities of gait and mobility (R26.89);Muscle weakness (generalized) (M62.81)     Time: [id number] PT Time Calculation (min) (ACUTE ONLY): 30 min  Charges:  $Gait Training: 23-37 mins                     Earney Navy, PTA Acute Rehabilitation Services Pager: [phone] Office: [phone]     Darliss Cheney 07/06/2019, 2:31 PM

## 2019-07-06 NOTE — Progress Notes (Signed)
Inpatient Rehab Admissions Coordinator:   Asked to re-screen pt for possible admission to CIR. Reviewed pt's notes and pt continues to progress with therapies.  She does demonstrate significant visual deficits, however it is because of these visual deficits, she will likely have supervision level goals with CIR.  Based on current functional level, and likely end-rehab goal level, feel that pt will meet goals in less than 3 days.  Because of this, she does not meet criteria for our program.   Shann Medal, PT, DPT Admissions Coordinator [phone] 07/06/19  11:02 AM

## 2019-07-06 NOTE — Progress Notes (Signed)
PROGRESS NOTE    Meagan [name]  [id number] DOB: [birth year] DOA: 07/01/2019 PCP: Steele Sizer, MD   Brief Narrative: 68 year old with past medical history significant for diabetes type 2, hypertension, hyperlipidemia, carotid artery stenosis presenting to the hospital for evaluation of high blood pressure and headache.  Patient has not been taking her medications as prescribed.  She reports having right-sided occipital headache for the past few days.  She was seen by an eye specialist who told her that the pressure in her eyes was high and had given her an injection.  She continued to have headache and about 3 days ago noticed that she lost her peripheral vision.  Evaluation in the ED patient was found to have an acute infarct in the right parietal-occipital junction 5 x 2 x 3 cm.  MRA of the head and neck without evidence of large vessel occlusion.   Assessment & Plan:   Principal Problem:   Acute CVA (cerebrovascular accident) [facility]) Active Problems:   Hypertension, benign   Type 2 diabetes, uncontrolled, with neuropathy (HCC)   Leukocytosis   Polycythemia   1-Acute CVA, Right parietal occipital junction; --Patient with resultant hemianopia and mild right-sided weakness -Started on dual antiplatelet therapy aspirin and Plavix. -CTA neck:45% diameter stenosis proximal right internal carotid artery. 60% diameter stenosis right external carotid artery 30% diameter stenosis distal left common carotid artery. Leftinternal carotid artery mildly disease without significant stenosis. -Follow stroke team recommendation.  Stroke team recommending aspirin and Plavix for 3 months, thereafter Plavix alone -Hydration for concentration. -echo normal ejection fraction, diastolic dysfunction -Needs better diabetic control hemoglobin A1c at 10. -LDL 199 started on Lipitor -stroke team refer patient to cardiology for Holter monitor.  -Stable.   2-Hypertension: Permissive hypertension  Continue with Norvasc and irbesartan. resume HCTZ today.  Replete low potasium. Repeat Bmet tomorrow.   3-Hemoconcentration: Patient report poor oral intake.   Improved with IV fluids.  Hb  has decreased to 14.  4-Diabetes, hyperglycemia uncontrolled: Continue with Lantus. Increase dose.  Diet education Better controlled   5-Hypokalemia: Replete orally. 6-Headache; tramadol prn.   Estimated body mass index is 36.37 kg/m as calculated from the following:   Height as of this encounter: 5\' 4"  (1.626 m).   Weight as of this encounter: 96.1 kg.   DVT prophylaxis: Lovenox Code Status: Full code Family Communication: Discussed with patient Disposition Plan: she will required SNF, start BP medications, adjust lantus.  Consultants:   Neurology  Procedures:   Echo normal EF, diastolic dysfunction.   Antimicrobials:  None  Subjective: Vision loss persist. Stable is not worse.  FMLA paper filled out.    Objective: Vitals:   07/05/19 2330 07/06/19 0401 07/06/19 0931 07/06/19 1223  BP: (!) 146/88 133/84 (!) 164/96 (!) 147/87  Pulse: 69 71 72 79  Resp: 18 18 20 15   Temp: 98.2 F (36.8 C) 97.6 F (36.4 C) 97.7 F (36.5 C) (!) 97.5 F (36.4 C)  TempSrc: Oral Oral Oral Oral  SpO2: 99% 97% 100% 98%  Weight:      Height:        Intake/Output Summary (Last 24 hours) at 07/06/2019 1324 Last data filed at 07/06/2019 0401 Gross per 24 hour  Intake 460 ml  Output 650 ml  Net -190 ml   Filed Weights   07/05/19 2101  Weight: 96.1 kg    Examination:  General exam: NAD Respiratory system; CTA Cardiovascular system: S 1, S 2 RRR Gastrointestinal system: BS present, soft, nt Central nervous  system: Alert and oriented left Homonymous hemianopia.  Extremities: No edema    Data Reviewed: I have personally reviewed following labs and imaging studies  CBC: Recent Labs  Lab 07/01/19 1204 07/02/19 0411 07/03/19 0318 07/06/19 0412  WBC 11.9* 11.1* 9.5 10.2  HGB 16.5*  16.2* 14.6 13.7  HCT 46.8* 47.4* 43.0 38.4  MCV 86.7 87.6 87.8 87.1  PLT 297 329 286 [id number]   Basic Metabolic Panel: Recent Labs  Lab 07/01/19 1204 07/03/19 0318 07/04/19 0748 07/06/19 0412  NA 132* 138 138 138  K 3.9 2.9* 4.3 3.3*  CL 93* 100 103 99  CO2 25 28 28 30   GLUCOSE 316* 136* 223* 138*  BUN 11 14 13 10   CREATININE 0.82 0.80 0.79 0.81  CALCIUM 9.0 8.6* 8.7* 8.7*   GFR: Estimated Creatinine Clearance: 76.9 mL/min (by C-G formula based on SCr of 0.81 mg/dL). Liver Function Tests: Recent Labs  Lab 07/01/19 1204  AST 17  ALT 18  ALKPHOS 126  BILITOT 0.7  PROT 7.7  ALBUMIN 3.1*   Recent Labs  Lab 07/01/19 1204  LIPASE 33   No results for input(s): AMMONIA in the last 168 hours. Coagulation Profile: No results for input(s): INR, PROTIME in the last 168 hours. Cardiac Enzymes: No results for input(s): CKTOTAL, CKMB, CKMBINDEX, TROPONINI in the last 168 hours. BNP (last 3 results) No results for input(s): PROBNP in the last 8760 hours. HbA1C: No results for input(s): HGBA1C in the last 72 hours. CBG: Recent Labs  Lab 07/05/19 1618 07/05/19 2140 07/06/19 0607 07/06/19 0943 07/06/19 1219  GLUCAP 183* 226* 120* 135* 276*   Lipid Profile: No results for input(s): CHOL, HDL, LDLCALC, TRIG, CHOLHDL, LDLDIRECT in the last 72 hours. Thyroid Function Tests: No results for input(s): TSH, T4TOTAL, FREET4, T3FREE, THYROIDAB in the last 72 hours. Anemia Panel: No results for input(s): VITAMINB12, FOLATE, FERRITIN, TIBC, IRON, RETICCTPCT in the last 72 hours. Sepsis Labs: No results for input(s): PROCALCITON, LATICACIDVEN in the last 168 hours.  Recent Results (from the past 240 hour(s))  SARS CORONAVIRUS 2 (TAT 6-24 HRS) Nasopharyngeal Nasopharyngeal Swab     Status: None   Collection Time: 07/01/19  6:59 PM   Specimen: Nasopharyngeal Swab  Result Value Ref Range Status   SARS Coronavirus 2 NEGATIVE NEGATIVE Final    Comment: (NOTE) SARS-CoV-2 target nucleic  acids are NOT DETECTED. The SARS-CoV-2 RNA is generally detectable in upper and lower respiratory specimens during the acute phase of infection. Negative results do not preclude SARS-CoV-2 infection, do not rule out co-infections with other pathogens, and should not be used as the sole basis for treatment or other patient management decisions. Negative results must be combined with clinical observations, patient history, and epidemiological information. The expected result is Negative. Fact Sheet for Patients: SugarRoll.be Fact Sheet for Healthcare Providers: https://www.woods-mathews.com/ This test is not yet approved or cleared by the [place] FDA and  has been authorized for detection and/or diagnosis of SARS-CoV-2 by FDA under an Emergency Use Authorization (EUA). This EUA will remain  in effect (meaning this test can be used) for the duration of the COVID-19 declaration under Section 56 4(b)(1) of the Act, 21 U.S.C. section 360bbb-3(b)(1), unless the authorization is terminated or revoked sooner. Performed at [facility], [facility] [address] [place], [place] [address]          Radiology Studies: No results found.      Scheduled Meds: . amLODipine  5 mg Oral Daily  . aspirin  325 mg Oral Daily  . atorvastatin  80 mg Oral q1800  . clopidogrel  75 mg Oral Daily  . enoxaparin (LOVENOX) injection  40 mg Subcutaneous Q24H  . hydrochlorothiazide  25 mg Oral Daily  . insulin aspart  0-9 Units Subcutaneous TID WC  . insulin glargine  25 Units Subcutaneous Daily  . irbesartan  75 mg Oral Daily  . polyethylene glycol  17 g Oral BID  . senna  1 tablet Oral BID   Continuous Infusions:    LOS: 5 days    Time spent: 35 minutes.     Elmarie Shiley, MD [facility] Pager [phone]  If 7PM-7AM, please contact night-coverage [email or web address] Password [email or web address] 07/06/2019, 1:24 PM

## 2019-07-06 NOTE — TOC Progression Note (Signed)
Transition of Care [facility]) - Progression Note    Patient Details  Name: DILIA TARVER MRN: [id number] Date of Birth: Jul 17, 1951  Transition of Care [facility]) CM/SW [place],  Phone Number: 07/06/2019, 10:22 AM  Clinical Narrative:   CSW following for discharge plan. CSW received call from daughter with preferences for SNF. Family would prefer [facility] or [facility] as top two choices. CSW reached out to both facilities to ask them to review referral. CSW to follow.    Expected Discharge Plan: [place] Barriers to Discharge: Continued Medical Work up, Ship broker  Expected Discharge Plan and Services Expected Discharge Plan: [place] In-house Referral: Clinical Social Work Discharge Planning Services: NA Post Acute Care Choice: [place] Living arrangements for the past 2 months: Single Family Home                 DME Arranged: N/A DME Agency: NA       HH Arranged: NA HH Agency: NA         Social Determinants of Health (SDOH) Interventions    Readmission Risk Interventions No flowsheet data found.

## 2019-07-06 NOTE — TOC Progression Note (Addendum)
Transition of Care [facility]) - Progression Note    Patient Details  Name: Meagan [name] MRN: [id number] Date of Birth: 1951/06/21  Transition of Care [facility]) CM/SW [place], [place] Phone Number: 07/06/2019, 3:48 PM  Clinical Narrative:   CSW received a decline from [facility] for SNF, and CSW passed information on to daughter Anderson Malta. Anderson Malta discussed other facility choice options, including [facility], [facility], and [facility]. CSW reached out to [facility]. [facility] has declined, and [facility] is still reviewing. [facility] has offered patient a bed. [facility] would be the family's first choice.  UPDATE 4:17 PM: Patient has been accepted at [facility], and they will initiate insurance authorization. CSW to follow.  Expected Discharge Plan: [place] Barriers to Discharge: Continued Medical Work up, Ship broker  Expected Discharge Plan and Services Expected Discharge Plan: [place] In-house Referral: Clinical Social Work Discharge Planning Services: NA Post Acute Care Choice: [place] Living arrangements for the past 2 months: Single Family Home                 DME Arranged: N/A DME Agency: NA       HH Arranged: NA HH Agency: NA         Social Determinants of Health (SDOH) Interventions    Readmission Risk Interventions No flowsheet data found.

## 2019-07-07 LAB — BASIC METABOLIC PANEL
Anion gap: 8 (ref 5–15)
BUN: 11 mg/dL (ref 8–23)
CO2: 29 mmol/L (ref 22–32)
Calcium: 8.5 mg/dL — ABNORMAL LOW (ref 8.9–10.3)
Chloride: 100 mmol/L (ref 98–111)
Creatinine, Ser: 0.76 mg/dL (ref 0.44–1.00)
GFR calc Af Amer: >60 mL/min (ref >60)
GFR calc non Af Amer: >60 mL/min (ref >60)
Glucose, Bld: 161 mg/dL — ABNORMAL HIGH (ref 70–99)
Potassium: 3.3 mmol/L — ABNORMAL LOW (ref 3.5–5.1)
Sodium: 137 mmol/L (ref 135–145)

## 2019-07-07 LAB — GLUCOSE, CAPILLARY
Glucose-Capillary: 126 mg/dL — ABNORMAL HIGH (ref 70–99)
Glucose-Capillary: 205 mg/dL — ABNORMAL HIGH (ref 70–99)
Glucose-Capillary: 147 mg/dL — ABNORMAL HIGH (ref 70–99)
Glucose-Capillary: 207 mg/dL — ABNORMAL HIGH (ref 70–99)

## 2019-07-07 LAB — NOVEL CORONAVIRUS, NAA (HOSP ORDER, SEND-OUT TO REF LAB; TAT 18-24 HRS): SARS-CoV-2, NAA: NOT DETECTED

## 2019-07-07 MED ORDER — POTASSIUM CHLORIDE CRYS ER 20 MEQ PO TBCR
40.0000 meq | EXTENDED_RELEASE_TABLET | Freq: Once | ORAL | Status: AC
  Administered 2019-07-07: 09:00:00 40 meq via ORAL
  Filled 2019-07-07: qty 2

## 2019-07-07 NOTE — TOC Progression Note (Signed)
Transition of Care [facility]) - Progression Note    Patient Details  Name: SHAUNTICE ZAMPINO MRN: [id number] Date of Birth: [birth year]  Transition of Care [facility]) CM/SW [place],  Phone Number: 07/07/2019, 10:26 AM  Clinical Narrative:   CSW received voicemail after hours from daughter yesterday that the sisters had changed their mind and wanted to go with [facility] instead of [facility]. CSW notified [facility] to cancel the auth request and asked [facility] to initiate authorization through [facility]. CSW to follow. Patient continues to await result of COVID test, as well.     Expected Discharge Plan: [place] Barriers to Discharge: Continued Medical Work up, Ship broker  Expected Discharge Plan and Services Expected Discharge Plan: [place] In-house Referral: Clinical Social Work Discharge Planning Services: NA Post Acute Care Choice: [place] Living arrangements for the past 2 months: Single Family Home                 DME Arranged: N/A DME Agency: NA       HH Arranged: NA HH Agency: NA         Social Determinants of Health (SDOH) Interventions    Readmission Risk Interventions No flowsheet data found.

## 2019-07-07 NOTE — Progress Notes (Signed)
Inpatient Diabetes Program Recommendations  AACE/ADA: New Consensus Statement on Inpatient Glycemic Control (2015)  Target Ranges:  Prepandial:   less than 140 mg/dL      [facility] postprandial:   less than 180 mg/dL (1-2 hours)      Critically ill patients:  140 - 180 mg/dL   Lab Results  Component Value Date   GLUCAP 205 (H) 07/07/2019   HGBA1C 10.1 (H) 07/02/2019    Review of Glycemic Control Results for Meagan [name], Meagan [name] (MRN [id number]) as of 07/07/2019 12:43  Ref. Range 07/06/2019 12:19 07/06/2019 15:49 07/06/2019 21:38 07/07/2019 06:28 07/07/2019 11:29  Glucose-Capillary Latest Ref Range: 70 - 99 mg/dL 276 (H) 265 (H) 141 (H) 126 (H) 205 (H)   Inpatient Diabetes Program Recommendations:   -Novolog 3 units tid meal coverage if eats 50%  Thank you, Bethena Roys E. Aily Tzeng, RN, MSN, CDE  Diabetes Coordinator Inpatient Glycemic Control Team Team Pager [phone] (8am-5pm) 07/07/2019 12:43 PM

## 2019-07-07 NOTE — Progress Notes (Signed)
PROGRESS NOTE    Meagan [name]  [id number] DOB: 02/06/1951 DOA: 07/01/2019 PCP: Steele Sizer, MD   Brief Narrative: 68 year old with past medical history significant for diabetes type 2, hypertension, hyperlipidemia, carotid artery stenosis presenting to the hospital for evaluation of high blood pressure and headache.  Patient has not been taking her medications as prescribed.  She reports having right-sided occipital headache for the past few days.  She was seen by an eye specialist who told her that the pressure in her eyes was high and had given her an injection.  She continued to have headache and about 3 days ago noticed that she lost her peripheral vision.  Evaluation in the ED patient was found to have an acute infarct in the right parietal-occipital junction 5 x 2 x 3 cm.  MRA of the head and neck without evidence of large vessel occlusion.  Patient was admitted with acute right parietal occipital junction and stroke.  She was a started on aspirin and Plavix.  She will need aspirin and Plavix for 3 months and thereafter Plavix alone.  She will need multiple risk factors modification.  Of note patient received intraocular Eylea injection.  There is a warning regarding this injection about a stroke.  Her ophthalmology will probably have to use different treatment option for Ms. [name].  I would not use Eylea again. Patient has remained stable awaiting a skilled nursing facility placement.  Assessment & Plan:   Principal Problem:   Acute CVA (cerebrovascular accident) [facility]) Active Problems:   Hypertension, benign   Type 2 diabetes, uncontrolled, with neuropathy (HCC)   Leukocytosis   Polycythemia   1-Acute CVA, Right parietal occipital junction; --Patient with resultant hemianopia and mild right-sided weakness -Started on dual antiplatelet therapy aspirin and Plavix. -CTA neck:45% diameter stenosis proximal right internal carotid artery. 60% diameter stenosis right external  carotid artery 30% diameter stenosis distal left common carotid artery. Leftinternal carotid artery mildly disease without significant stenosis. -Follow stroke team recommendation.  Stroke team recommending aspirin and Plavix for 3 months, thereafter Plavix alone -Hydration for concentration. -echo normal ejection fraction, diastolic dysfunction -Needs better diabetic control hemoglobin A1c at 10. -LDL 199 started on Lipitor -stroke team refer patient to cardiology for Holter monitor.  -Stable.   2-Hypertension: Permissive hypertension Continue with Norvasc and irbesartan, HCTZ Continued to adjust medication as needed, most of the day yesterday her blood pressure was better controlled.  3-Hemoconcentration: Patient report poor oral intake.   Improved with IV fluids.  Hb  has decreased to 14.  4-Diabetes, hyperglycemia uncontrolled: Continue with Lantus. Increase dose.  Diet education Better controlled   5-Hypokalemia: Replete orally.  She will probably need to be on a scheduled dose of potassium 6-Headache; tramadol prn.   Estimated body mass index is 36.37 kg/m as calculated from the following:   Height as of this encounter: 5\' 4"  (1.626 m).   Weight as of this encounter: 96.1 kg.   DVT prophylaxis: Lovenox Code Status: Full code Family Communication: Discussed with patient Disposition Plan: she will required SNF, start BP medications, adjust lantus.  Consultants:   Neurology  Procedures:   Echo normal EF, diastolic dysfunction.   Antimicrobials:  None  Subjective: She is feeling that her vision is stable and improving slowly.  I spoke with her about the recent injection that she received Eylea, and I would like for her not to get any more of those injection if possible.  I will defer to her ophthalmology for their treatments  options  Objective: Vitals:   07/06/19 2000 07/07/19 0000 07/07/19 0400 07/07/19 1124  BP: 139/82 126/76 130/82 (!) 158/91  Pulse: 78 70 73  76  Resp: 18 18 18 17   Temp: 98.6 F (37 C) 98.6 F (37 C) 98.4 F (36.9 C) 97.7 F (36.5 C)  TempSrc: Oral Oral Oral Oral  SpO2: 98% 100% 98% 95%  Weight:      Height:        Intake/Output Summary (Last 24 hours) at 07/07/2019 1228 Last data filed at 07/07/2019 0810 Gross per 24 hour  Intake 240 ml  Output 500 ml  Net -260 ml   Filed Weights   07/05/19 2101  Weight: 96.1 kg    Examination:  General exam: No acute distress Respiratory system; clear to auscultation Cardiovascular system: 1 S2 regular rhythm and rate Gastrointestinal system: Bowel sounds present, soft nontender nondistended Central nervous system: Alert and oriented. left Homonymous hemianopia.  Extremities: No edema    Data Reviewed: I have personally reviewed following labs and imaging studies  CBC: Recent Labs  Lab 07/01/19 1204 07/02/19 0411 07/03/19 0318 07/06/19 0412  WBC 11.9* 11.1* 9.5 10.2  HGB 16.5* 16.2* 14.6 13.7  HCT 46.8* 47.4* 43.0 38.4  MCV 86.7 87.6 87.8 87.1  PLT 297 329 286 [id number]   Basic Metabolic Panel: Recent Labs  Lab 07/01/19 1204 07/03/19 0318 07/04/19 0748 07/06/19 0412 07/07/19 0358  NA 132* 138 138 138 137  K 3.9 2.9* 4.3 3.3* 3.3*  CL 93* 100 103 99 100  CO2 25 28 28 30 29   GLUCOSE 316* 136* 223* 138* 161*  BUN 11 14 13 10 11   CREATININE 0.82 0.80 0.79 0.81 0.76  CALCIUM 9.0 8.6* 8.7* 8.7* 8.5*   GFR: Estimated Creatinine Clearance: 77.9 mL/min (by C-G formula based on SCr of 0.76 mg/dL). Liver Function Tests: Recent Labs  Lab 07/01/19 1204  AST 17  ALT 18  ALKPHOS 126  BILITOT 0.7  PROT 7.7  ALBUMIN 3.1*   Recent Labs  Lab 07/01/19 1204  LIPASE 33   No results for input(s): AMMONIA in the last 168 hours. Coagulation Profile: No results for input(s): INR, PROTIME in the last 168 hours. Cardiac Enzymes: No results for input(s): CKTOTAL, CKMB, CKMBINDEX, TROPONINI in the last 168 hours. BNP (last 3 results) No results for input(s): PROBNP  in the last 8760 hours. HbA1C: No results for input(s): HGBA1C in the last 72 hours. CBG: Recent Labs  Lab 07/06/19 1219 07/06/19 1549 07/06/19 2138 07/07/19 0628 07/07/19 1129  GLUCAP 276* 265* 141* 126* 205*   Lipid Profile: No results for input(s): CHOL, HDL, LDLCALC, TRIG, CHOLHDL, LDLDIRECT in the last 72 hours. Thyroid Function Tests: No results for input(s): TSH, T4TOTAL, FREET4, T3FREE, THYROIDAB in the last 72 hours. Anemia Panel: No results for input(s): VITAMINB12, FOLATE, FERRITIN, TIBC, IRON, RETICCTPCT in the last 72 hours. Sepsis Labs: No results for input(s): PROCALCITON, LATICACIDVEN in the last 168 hours.  Recent Results (from the past 240 hour(s))  SARS CORONAVIRUS 2 (TAT 6-24 HRS) Nasopharyngeal Nasopharyngeal Swab     Status: None   Collection Time: 07/01/19  6:59 PM   Specimen: Nasopharyngeal Swab  Result Value Ref Range Status   SARS Coronavirus 2 NEGATIVE NEGATIVE Final    Comment: (NOTE) SARS-CoV-2 target nucleic acids are NOT DETECTED. The SARS-CoV-2 RNA is generally detectable in upper and lower respiratory specimens during the acute phase of infection. Negative results do not preclude SARS-CoV-2 infection, do not rule out co-infections  with other pathogens, and should not be used as the sole basis for treatment or other patient management decisions. Negative results must be combined with clinical observations, patient history, and epidemiological information. The expected result is Negative. Fact Sheet for Patients: SugarRoll.be Fact Sheet for Healthcare Providers: https://www.woods-mathews.com/ This test is not yet approved or cleared by the [place] FDA and  has been authorized for detection and/or diagnosis of SARS-CoV-2 by FDA under an Emergency Use Authorization (EUA). This EUA will remain  in effect (meaning this test can be used) for the duration of the COVID-19 declaration under Section 56  4(b)(1) of the Act, 21 U.S.C. section 360bbb-3(b)(1), unless the authorization is terminated or revoked sooner. Performed at [facility], [place] [address], [place], [place] [address]          Radiology Studies: No results found.      Scheduled Meds: . amLODipine  5 mg Oral Daily  . aspirin  325 mg Oral Daily  . atorvastatin  80 mg Oral q1800  . clopidogrel  75 mg Oral Daily  . enoxaparin (LOVENOX) injection  40 mg Subcutaneous Q24H  . hydrochlorothiazide  25 mg Oral Daily  . insulin aspart  0-9 Units Subcutaneous TID WC  . insulin glargine  25 Units Subcutaneous Daily  . irbesartan  75 mg Oral Daily  . polyethylene glycol  17 g Oral BID  . senna  1 tablet Oral BID   Continuous Infusions:    LOS: 6 days    Time spent: 35 minutes.     Elmarie Shiley, MD [facility] Pager [phone]  If 7PM-7AM, please contact night-coverage [email or web address] Password [facility] 07/07/2019, 12:28 PM

## 2019-07-07 NOTE — Plan of Care (Signed)
  Problem: Nutrition: Goal: Adequate nutrition will be maintained Outcome: Progressing   

## 2019-07-07 NOTE — Progress Notes (Signed)
  Speech Language Pathology Treatment: Cognitive-Linquistic  Patient Details Name: Meagan [name] MRN: [id number] DOB: 12-16-1950 Today's Date: 07/07/2019 Time: [id number] SLP Time Calculation (min) (ACUTE ONLY): 23 min  Assessment / Plan / Recommendation Clinical Impression  Pt was seen for cognitive-linguistic treatment and was cooperative during the session. Her overall processing speed was improved compared to that which was noted during the initial evaluation. Only one  episode of tearfulness was demonstrated during the session which is also improved compared to her initial treatment session. Pt was re-educated regarding the purpose of the tasks but she still verbalized trepidation regarding the possibility of her not performing well. Pt was in better spirits following education and expressed gratitude at the end of the session for the SLP's treatment. She provided 11-15 items per category during divergent naming tasks with an average of 11 items per category. She demonstrated 80% accuracy with time management problems increasing to 100% with min cues. She completed a mental manipulation (3-word opposite) task with additional processing time. She achieved 60% accuracy with recall of concrete information from voicemail recordings increasing to 80% with choice cues. SLP will continue to follow pt.    HPI HPI: Pt is a 68 y.o. female with medical history significant of type 2 diabetes, hypertension, hyperlipidemia, carotid artery stenosis who presented to the hospital for evaluation of high blood pressure and headache. MRI of the brain revealed acute infarction at the right parieto-occipital junction affecting a region of brain measuring about 5 x 2 x 3 cm.      SLP Plan  Continue with current plan of care       Recommendations                   Follow up Recommendations: Skilled Nursing facility SLP Visit Diagnosis: Cognitive communication deficit [id number]) Plan: Continue with current  plan of care       Macon Lesesne I. Hardin Negus, [place], [place] Office number [phone] Pager [place] 07/07/2019, 9:12 AM

## 2019-07-08 LAB — BASIC METABOLIC PANEL
Anion gap: 10 (ref 5–15)
BUN: 10 mg/dL (ref 8–23)
CO2: 28 mmol/L (ref 22–32)
Calcium: 8.7 mg/dL — ABNORMAL LOW (ref 8.9–10.3)
Chloride: 99 mmol/L (ref 98–111)
Creatinine, Ser: 0.77 mg/dL (ref 0.44–1.00)
GFR calc Af Amer: >60 mL/min (ref >60)
GFR calc non Af Amer: >60 mL/min (ref >60)
Glucose, Bld: 131 mg/dL — ABNORMAL HIGH (ref 70–99)
Potassium: 3.5 mmol/L (ref 3.5–5.1)
Sodium: 137 mmol/L (ref 135–145)

## 2019-07-08 LAB — GLUCOSE, CAPILLARY
Glucose-Capillary: 120 mg/dL — ABNORMAL HIGH (ref 70–99)
Glucose-Capillary: 219 mg/dL — ABNORMAL HIGH (ref 70–99)
Glucose-Capillary: 155 mg/dL — ABNORMAL HIGH (ref 70–99)

## 2019-07-08 MED ORDER — INSULIN GLARGINE 100 UNIT/ML ~~LOC~~ SOLN: 25.0000 [IU] | Freq: Every day | SUBCUTANEOUS | Status: DC

## 2019-07-08 MED ORDER — TRAMADOL HCL 50 MG PO TABS: 50.0000 mg | ORAL_TABLET | Freq: Four times a day (QID) | ORAL | 0 refills | Status: DC | PRN

## 2019-07-08 MED ORDER — ATORVASTATIN CALCIUM 80 MG PO TABS: 80.0000 mg | ORAL_TABLET | Freq: Every day | ORAL | Status: DC

## 2019-07-08 MED ORDER — ASPIRIN 325 MG PO TABS: 325.0000 mg | ORAL_TABLET | Freq: Every day | ORAL | Status: AC

## 2019-07-08 MED ORDER — CLOPIDOGREL BISULFATE 75 MG PO TABS: 75.0000 mg | ORAL_TABLET | Freq: Every day | ORAL | Status: DC

## 2019-07-08 MED ORDER — AMLODIPINE BESYLATE 5 MG PO TABS: 5.0000 mg | ORAL_TABLET | Freq: Every day | ORAL | Status: DC

## 2019-07-08 MED ORDER — POLYETHYLENE GLYCOL 3350 17 G PO PACK: 17.0000 g | PACK | Freq: Every day | ORAL | 0 refills | Status: AC

## 2019-07-08 NOTE — Progress Notes (Signed)
Occupational Therapy Treatment Patient Details Name: Meagan [name] MRN: [id number] DOB: May 05, 1951 Today's Date: 07/08/2019    History of present illness 68 y.o. female with medical history significant of type 2 diabetes, hypertension, hyperlipidemia, carotid artery stenosis presented 07/01/19 to the hospital for evaluation of high blood pressure and headache.  She had not been taking her home blood pressure and diabetes medications as prescribed. 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 Left homonymous hemianopsia   OT comments  Pt limited by poor vision and constant need to turn head in all angles to see especially on L side.Pt performing own toilet hygiene and self care tasks at sink in standing. pt requiring increased time for visual scanning and figure ground to find objects needed. Pt increasing function with compensatory strategies. Pt performing letter cancellation task, word search and clock drawing task with increased time, but 100% accuracy. Pt adapting well contorting head and body to see for tasks. Pt continues to require continued OT skilled services for visual deficits. OT following acutely.    Follow Up Recommendations  SNF;Supervision - Intermittent    Equipment Recommendations  None recommended by OT    Recommendations for Other Services      Precautions / Restrictions Precautions Precautions: Fall Restrictions Weight Bearing Restrictions: No       Mobility Bed Mobility Overal bed mobility: Needs Assistance Bed Mobility: Supine to Sit     Supine to sit: Supervision     General bed mobility comments: up in chair upon arrival  Transfers Overall transfer level: Needs assistance Equipment used: None Transfers: Sit to/from Stand Sit to Stand: Min guard         General transfer comment: min guard for safety    Balance Overall balance assessment: Needs assistance Sitting-balance support: No upper extremity  supported;Feet supported Sitting balance-Leahy Scale: Good     Standing balance support: No upper extremity supported Standing balance-Leahy Scale: Fair                             ADL either performed or assessed with clinical judgement   ADL Overall ADL's : Needs assistance/impaired                                     Functional mobility during ADLs: Min guard;Minimal assistance;Rolling walker;Cueing for safety General ADL Comments: Pt performing own toilet hygiene and self care tasks at sink in standing. pt requiring increased time for visual scanning and figure ground to find objects needed. Pt increasing function with compensatory strategies.     Vision   Vision Assessment?: Yes Eye Alignment: Within Functional Limits Ocular Range of Motion: Restricted on the left Tracking/Visual Pursuits: Left eye does not track laterally Saccades: Additional eye shifts occurred during testing;Additional head turns occurred during testing;Overshoots;Undershoots;Decreased speed of saccadic movement Convergence: Impaired (comment)   Perception     Praxis      Cognition Arousal/Alertness: Awake/alert Behavior During Therapy: WFL for tasks assessed/performed Overall Cognitive Status: Within Functional Limits for tasks assessed Area of Impairment: Awareness                                        Exercises     Shoulder Instructions       General Comments pt's  younger sister, Meagan [name] present for session    Pertinent Vitals/ Pain       Pain Assessment: Faces Faces Pain Scale: Hurts a little bit Pain Location: headache Pain Descriptors / Indicators: Headache Pain Intervention(s): Monitored during session  Home Living                                          Prior Functioning/Environment              Frequency  Min 2X/week        Progress Toward Goals  OT Goals(current goals can now be found in the care plan  section)  Progress towards OT goals: Progressing toward goals  Acute Rehab OT Goals Patient Stated Goal: be able to see and read to return to work OT Goal Formulation: With patient Time For Goal Achievement: 07/17/19 Potential to Achieve Goals: Good ADL Goals Pt Will Perform Grooming: with modified independence;standing Pt/caregiver will Perform Home Exercise Program: With written HEP provided Additional ADL Goal #1: Pt will increase to using 3 compensatory or adaptive strategies with mobility and ADL with L sided visual deficits  Plan Discharge plan remains appropriate    Co-evaluation                 AM-PAC OT "6 Clicks" Daily Activity     Outcome Measure   Help from another person eating meals?: None Help from another person taking care of personal grooming?: A Little Help from another person toileting, which includes using toliet, bedpan, or urinal?: A Little Help from another person bathing (including washing, rinsing, drying)?: A Little Help from another person to put on and taking off regular upper body clothing?: None Help from another person to put on and taking off regular lower body clothing?: A Little 6 Click Score: 20    End of Session Equipment Utilized During Treatment: Rolling walker;Gait belt  OT Visit Diagnosis: Unsteadiness on feet (R26.81);Repeated falls (R29.6);Low vision, both eyes (H54.2)   Activity Tolerance Patient tolerated treatment well   Patient Left in chair;with call bell/phone within reach;with chair alarm set   Nurse Communication Mobility status;Precautions        Time: [id number] OT Time Calculation (min): 31 min  Charges: OT General Charges $OT Visit: 1 Visit OT Treatments $Self Care/Home Management : 8-22 mins $Neuromuscular Re-education: 8-22 mins  Darryl Nestle) Marsa Aris OTR/L Acute Rehabilitation Services Pager: [phone] Office: [phone]    Jenene Slicker Angline Schweigert 07/08/2019, 3:50 PM

## 2019-07-08 NOTE — Progress Notes (Signed)
Pt. Discharged to [facility], report given to SN[facility] receiver. [facility] transporting patient. Pt. Belongings returned, patient and family updated. Onya Eutsler A Sunshine Mackowski

## 2019-07-08 NOTE — Progress Notes (Signed)
Physical Therapy Treatment Patient Details Name: Meagan [name] MRN: [id number] DOB: June 10, 1951 Today's Date: 07/08/2019    History of Present Illness 68 y.o. female with medical history significant of type 2 diabetes, hypertension, hyperlipidemia, carotid artery stenosis presented 07/01/19 to the hospital for evaluation of high blood pressure and headache.  She had not been taking her home blood pressure and diabetes medications as prescribed. 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 Left homonymous hemianopsia    PT Comments    Patient seen for mobility progression. Continue to recommend post acute rehab for further skilled PT services to maximize independence and safety with mobility.    Follow Up Recommendations  CIR;Supervision/Assistance - 24 hour     Equipment Recommendations  Other (comment)(TBD)    Recommendations for Other Services Rehab consult     Precautions / Restrictions Precautions Precautions: Fall Restrictions Weight Bearing Restrictions: No    Mobility  Bed Mobility Overal bed mobility: Needs Assistance Bed Mobility: Supine to Sit     Supine to sit: Supervision     General bed mobility comments: supervision for safety; increased time and effort   Transfers Overall transfer level: Needs assistance Equipment used: None Transfers: Sit to/from Stand Sit to Stand: Min guard         General transfer comment: min guard for safety  Ambulation/Gait Ambulation/Gait assistance: Min assist Gait Distance (Feet): (200 ft total with standing rest breaks) Assistive device: 1 person hand held assist Gait Pattern/deviations: Step-through pattern;Decreased stride length Gait velocity: decreased   General Gait Details: slow and guarded gait with cues for increased stride length; pt attempting to scan environment; still tends to reach to find her way around and due to decreased ability to tell her distance from  objects   Stairs             Wheelchair Mobility    Modified Rankin (Stroke Patients Only) Modified Rankin (Stroke Patients Only) Pre-Morbid Rankin Score: No symptoms Modified Rankin: Moderately severe disability     Balance Overall balance assessment: Needs assistance Sitting-balance support: No upper extremity supported;Feet supported Sitting balance-Leahy Scale: Good     Standing balance support: No upper extremity supported Standing balance-Leahy Scale: Fair                              Cognition Arousal/Alertness: Awake/alert Behavior During Therapy: WFL for tasks assessed/performed Overall Cognitive Status: Within Functional Limits for tasks assessed                                        Exercises      General Comments        Pertinent Vitals/Pain Pain Assessment: Faces Faces Pain Scale: Hurts little more Pain Location: headache    Home Living                      Prior Function            PT Goals (current goals can now be found in the care plan section) Progress towards PT goals: Progressing toward goals    Frequency    Min 4X/week      PT Plan Current plan remains appropriate    Co-evaluation              AM-PAC PT "6 Clicks" Mobility   Outcome  Measure  Help needed turning from your back to your side while in a flat bed without using bedrails?: None Help needed moving from lying on your back to sitting on the side of a flat bed without using bedrails?: A Little Help needed moving to and from a bed to a chair (including a wheelchair)?: A Little Help needed standing up from a chair using your arms (e.g., wheelchair or bedside chair)?: A Little Help needed to walk in hospital room?: A Little Help needed climbing 3-5 steps with a railing? : A Lot 6 Click Score: 18    End of Session Equipment Utilized During Treatment: Gait belt Activity Tolerance: Patient tolerated treatment  well Patient left: with call bell/phone within reach;in chair;with chair alarm set Nurse Communication: Mobility status PT Visit Diagnosis: Other abnormalities of gait and mobility (R26.89);Muscle weakness (generalized) (M62.81)     Time: [id number] PT Time Calculation (min) (ACUTE ONLY): 40 min  Charges:  $Gait Training: 23-37 mins                     Earney Navy, PTA Acute Rehabilitation Services Pager: [phone] Office: [phone]     Darliss Cheney 07/08/2019, 1:17 PM

## 2019-07-08 NOTE — Discharge Summary (Signed)
Physician Discharge Summary  Meagan [name]:[id number] DOB: [birth year] DOA: 07/01/2019  PCP: Meagan Sizer, MD  Admit date: 07/01/2019 Discharge date: 07/08/2019  Admitted From: Home Disposition: SNF  Recommendations for Outpatient Follow-up:  1. Follow up with PCP in 1 week 2. Please obtain BMP/CBC in one week 3. Neurology follow-up 4. Cardiology follow-up for holter montior 5. Please follow up on the following pending results: None  Home Health: SNF Equipment/Devices: SNF  Discharge Condition: Stable CODE STATUS: Full code Diet recommendation: Heart healthy/carb modified   Brief/Interim Summary:  Admission HPI written by Shela Leff, MD   Chief Complaint: High blood pressure, headache  HPI: Meagan [name] is a 68 y.o. female with medical history significant of type 2 diabetes, hypertension, hyperlipidemia, carotid artery stenosis presenting to the hospital for evaluation of high blood pressure and headache.  Patient states her blood pressure has been high recently.  She has not been taking her home blood pressure and diabetes medications as prescribed.  Reports having right-sided and occipital headaches for the past few days.  States she was seen by an eye specialist who told her that the pressure in her eyes was high and he had given her an injection.  She continued to have headaches and about 3 days ago noticed that she had lost her peripheral vision.  She is not able to see anything that is to her left.  Also a few days ago she noticed that her right arm was a little weak when she tried to pick up an object.  Denies history of prior stroke.  States she has never smoked cigarettes.  ED Course: Systolic in the 325Q to 982M.  White count 11.9.  Hemoglobin 16.5, previously in the 14 range.  ESR 55, CRP 1.2.  SARS-CoV-2 test pending.  Brain MRI showing acute infarction at the right parietal occipital junction affecting the region of the brain measuring about 5 x 2 x 3  cm.  No evidence of hemorrhage.  MRA head and neck without evidence of large vessel occlusion.  Neurology consulted by ED provider.  Patient received amlodipine 10 mg, Ativan 1 mg, and 1 L normal saline bolus.    Hospital course:  1-Acute CVA, Right parietal occipital junction; --Patient with resultant hemianopia and mild right-sided weakness -Started on dual antiplatelet therapy aspirin and Plavix. -CTA neck:45% diameter stenosis proximal right internal carotid artery. 60% diameter stenosis right external carotid artery 30% diameter stenosis distal left common carotid artery. Leftinternal carotid artery mildly disease without significant stenosis. -Stroke team recommending aspirin and Plavix for 3 months, thereafter Plavix alone -echo normal ejection fraction, diastolic dysfunction -Needs better diabetic control hemoglobin A1c at 10. -LDL 199 started on Lipitor 80 mg daily (patient was non-adherent with Crestor) -Refer patient to cardiology for Holter monitor.   2-Hypertension: Permissive hypertension while inpatient. Continue with Norvasc and irbesartan, HCTZ  3-Hemoconcentration: Patient report poor oral intake.   Improved with IV fluids.  Hb  has decreased to 14.  4-Diabetes, hyperglycemia uncontrolled: Started on Lantus with hemoglobin A1C of 10.1%. Discharge on Lantus, Ozembic and metformin. Hopefully can be weaned off of insulin over time. Discussed weight loss.  5-Hypokalemia: Replete orally.  She will probably need to be on a scheduled dose of potassium  6-Headache; tramadol prn.   Discharge Diagnoses:  Principal Problem:   Acute CVA (cerebrovascular accident) [facility]) Active Problems:   Hypertension, benign   Type 2 diabetes, uncontrolled, with neuropathy (HCC)   Leukocytosis   Polycythemia  Discharge Instructions  Discharge Instructions    Ambulatory referral to Neurology   Complete by: As directed    Follow up with stroke clinic NP (Jessica Vanschaick or  Cecille Rubin, if both not available, consider Zachery Dauer, or Ahern) at [facility] in about 4 weeks. Thanks.     Allergies as of 07/08/2019      Reactions   Farxiga [dapagliflozin] Rash   Pt reports that she developed a rash when she took [place] and she would like it to be added as an allergy   Keflex [cephalexin] Rash      Medication List    STOP taking these medications   dapagliflozin propanediol 10 MG Tabs tablet Commonly known as: Farxiga   diphenhydrAMINE 12.5 MG/5ML elixir Commonly known as: BENADRYL   fluconazole 150 MG tablet Commonly known as: DIFLUCAN   fluticasone 50 MCG/ACT nasal spray Commonly known as: FLONASE   glucose blood test strip Commonly known as: ONE TOUCH ULTRA TEST   ibuprofen 200 MG tablet Commonly known as: ADVIL   insulin degludec 100 UNIT/ML Sopn FlexTouch Pen Commonly known as: Tyler Aas FlexTouch   rosuvastatin 20 MG tablet Commonly known as: CRESTOR     TAKE these medications   amLODipine 5 MG tablet Commonly known as: NORVASC Take 1 tablet (5 mg total) by mouth daily. Start taking on: July 09, 2019   aspirin 325 MG tablet Take 1 tablet (325 mg total) by mouth daily. Take for 3 months only. What changed:   medication strength  how much to take  additional instructions   atorvastatin 80 MG tablet Commonly known as: LIPITOR Take 1 tablet (80 mg total) by mouth daily at 6 PM.   clopidogrel 75 MG tablet Commonly known as: PLAVIX Take 1 tablet (75 mg total) by mouth daily. Start taking on: July 09, 2019   insulin glargine 100 UNIT/ML injection Commonly known as: LANTUS Inject 0.25 mLs (25 Units total) into the skin daily. Start taking on: July 09, 2019   levocetirizine 5 MG tablet Commonly known as: XYZAL Take 5 mg by mouth as needed for allergies.   metFORMIN 750 MG 24 hr tablet Commonly known as: GLUCOPHAGE-XR Take 2 tablets (1,500 mg total) by mouth daily with breakfast. What changed:   how much to  take  when to take this   olmesartan-hydrochlorothiazide 40-25 MG tablet Commonly known as: BENICAR HCT Take 1 tablet by mouth daily.   Ozempic (1 MG/DOSE) 2 MG/1.5ML Sopn Generic drug: Semaglutide (1 MG/DOSE) INJECT 1 MG INTO THE SKIN ONCE A WEEK. What changed: See the new instructions.   polyethylene glycol 17 g packet Commonly known as: MIRALAX / GLYCOLAX Take 17 g by mouth daily.   traMADol 50 MG tablet Commonly known as: ULTRAM Take 1 tablet (50 mg total) by mouth every 6 (six) hours as needed for moderate pain.       Contact information for follow-up providers    [facility]. Schedule an appointment as soon as possible for a visit in 4 week(s).   Specialty: Neurology Contact information: [address] [place] [phone]       Jerline Pain, MD Follow up in 1 week(s).   Specialty: Cardiology Why: please call office to arrange holter monitor.  Contact information: [address] [place] [address] [phone]            Contact information for after-discharge care    Destination    [facility] Preferred [facility]F .   Service: Skilled Nursing Contact information: [place] [phone]                 Allergies  Allergen Reactions   [place] [Dapagliflozin] Rash    Pt reports that she developed a rash when she took [place] and she would like it to be added as an allergy   Keflex [Cephalexin] Rash    Consultations:  Neurology   Procedures/Studies: Ct Angio Neck W Or Wo Contrast  Result Date: 07/02/2019 CLINICAL DATA:  Stroke.  Right occipital infarct. EXAM: CT ANGIOGRAPHY NECK TECHNIQUE: Multidetector CT imaging of the neck was performed using the standard protocol during bolus administration of intravenous contrast. Multiplanar CT image reconstructions and MIPs were obtained to evaluate the vascular anatomy.  Carotid stenosis measurements (when applicable) are obtained utilizing NASCET criteria, using the distal internal carotid diameter as the denominator. CONTRAST:  23m OMNIPAQUE IOHEXOL 350 MG/ML SOLN COMPARISON:  MRI head 07/01/2019 FINDINGS: Aortic arch: Standard branching. Imaged portion shows no evidence of aneurysm or dissection. No significant stenosis of the major arch vessel origins. Right carotid system: Atherosclerotic calcified plaque right carotid bifurcation. Right internal carotid artery narrowed to 2.5 mm corresponding to 45% diameter stenosis. Approximately 60% diameter stenosis proximal right external carotid artery. Left carotid system: Atherosclerotic disease distal left common carotid artery narrowing the lumen by 30% diameter stenosis. Atherosclerotic calcification proximal left internal carotid artery without significant stenosis. Vertebral arteries: Left vertebral artery dominant. Widely patent to the basilar. Non dominant right vertebral artery widely patent to the basilar without stenosis. Skeleton: Mild cervical spine degenerative change. No acute skeletal abnormality. Other neck: Negative for mass or adenopathy. Upper chest: Negative IMPRESSION: 1. 45% diameter stenosis proximal right internal carotid artery. 60% diameter stenosis right external carotid artery 2. 30% diameter stenosis distal left common carotid artery. Left internal carotid artery mildly disease without significant stenosis 3. Both vertebral arteries are patent to the basilar without stenosis. Electronically Signed   By: CFranchot GalloM.D.   On: 07/02/2019 09:52   Mr Angio Head Wo Contrast  Result Date: 07/01/2019 CLINICAL DATA:  Acute severe headache. Arterial dissection suspected. Possible temporal arteritis. EXAM: MRI HEAD WITHOUT CONTRAST MRA HEAD WITHOUT CONTRAST MRA NECK WITHOUT CONTRAST TECHNIQUE: Multiplanar, multiecho pulse sequences of the brain and surrounding structures were obtained without intravenous  contrast. Angiographic images of the Circle of Willis were obtained using MRA technique without intravenous contrast. Angiographic images of the neck were obtained using MRA technique without intravenous contrast. Carotid stenosis measurements (when applicable) are obtained utilizing NASCET criteria, using the distal internal carotid diameter as the denominator. COMPARISON:  12/19/2006 FINDINGS: MRI HEAD FINDINGS Brain: Diffusion imaging shows acute infarction at the parieto-occipital junction region on the right affecting an area measuring approximately 5 x 2 x 3 cm. Mild swelling but no visible hemorrhage. No other acute finding. There chronic small-vessel ischemic changes of the pons. No focal cerebellar insult. Cerebral hemispheres show moderate chronic small-vessel ischemic changes throughout the deep and subcortical hemispheric white matter. No mass lesion, hydrocephalus or extra-axial collection. Vascular: Major vessels at the base of the brain show flow. Skull and upper cervical spine: Negative Sinuses/Orbits: Clear/normal Other: None MRA HEAD FINDINGS Both internal carotid arteries are widely patent into the brain. No siphon stenosis. The anterior and middle cerebral vessels are patent without proximal stenosis, aneurysm or vascular malformation. Both vertebral arteries are widely patent to the basilar. No basilar stenosis. Posterior circulation branch vessels  show flow. There is atherosclerotic narrowing and irregularity of the PCA branches, with a focal stenosis at the right P1 P2 junction. MRA NECK FINDINGS The noncontrast exam is technically deficient but the patient could not tolerate repeat. It can be stated that there is antegrade flow in both vertebral arteries with the left being dominant. Both vertebral arteries reach the basilar. No basilar stenosis is seen of the proximal vessel. Little information can be obtained regarding the carotid arteries in the neck. Both common carotid arteries do show  antegrade flow and both internal carotid arteries do appear to be patent. IMPRESSION: Acute infarction at the right parieto-occipital junction affecting a region of brain measuring about 5 x 2 x 3 cm. No evidence of hemorrhage. Moderate chronic small-vessel ischemic changes elsewhere affecting the pons in the cerebral hemispheric white matter. Intracranial MR angiography does not show any large or medium vessel occlusion. There is distal vessel atherosclerotic irregularity in both posterior cerebral artery branches, including a focal stenosis at the right P1 P2 junction. No significant neck vessel disease suspected, but detail is quite limited due to technical deficiency of the noncontrast neck MRA. The patient would not allow repeat imaging. Electronically Signed   By: Nelson Chimes M.D.   On: 07/01/2019 18:27   Mr Angio Neck Wo Contrast  Result Date: 07/01/2019 CLINICAL DATA:  Acute severe headache. Arterial dissection suspected. Possible temporal arteritis. EXAM: MRI HEAD WITHOUT CONTRAST MRA HEAD WITHOUT CONTRAST MRA NECK WITHOUT CONTRAST TECHNIQUE: Multiplanar, multiecho pulse sequences of the brain and surrounding structures were obtained without intravenous contrast. Angiographic images of the Circle of Willis were obtained using MRA technique without intravenous contrast. Angiographic images of the neck were obtained using MRA technique without intravenous contrast. Carotid stenosis measurements (when applicable) are obtained utilizing NASCET criteria, using the distal internal carotid diameter as the denominator. COMPARISON:  12/19/2006 FINDINGS: MRI HEAD FINDINGS Brain: Diffusion imaging shows acute infarction at the parieto-occipital junction region on the right affecting an area measuring approximately 5 x 2 x 3 cm. Mild swelling but no visible hemorrhage. No other acute finding. There chronic small-vessel ischemic changes of the pons. No focal cerebellar insult. Cerebral hemispheres show moderate  chronic small-vessel ischemic changes throughout the deep and subcortical hemispheric white matter. No mass lesion, hydrocephalus or extra-axial collection. Vascular: Major vessels at the base of the brain show flow. Skull and upper cervical spine: Negative Sinuses/Orbits: Clear/normal Other: None MRA HEAD FINDINGS Both internal carotid arteries are widely patent into the brain. No siphon stenosis. The anterior and middle cerebral vessels are patent without proximal stenosis, aneurysm or vascular malformation. Both vertebral arteries are widely patent to the basilar. No basilar stenosis. Posterior circulation branch vessels show flow. There is atherosclerotic narrowing and irregularity of the PCA branches, with a focal stenosis at the right P1 P2 junction. MRA NECK FINDINGS The noncontrast exam is technically deficient but the patient could not tolerate repeat. It can be stated that there is antegrade flow in both vertebral arteries with the left being dominant. Both vertebral arteries reach the basilar. No basilar stenosis is seen of the proximal vessel. Little information can be obtained regarding the carotid arteries in the neck. Both common carotid arteries do show antegrade flow and both internal carotid arteries do appear to be patent. IMPRESSION: Acute infarction at the right parieto-occipital junction affecting a region of brain measuring about 5 x 2 x 3 cm. No evidence of hemorrhage. Moderate chronic small-vessel ischemic changes elsewhere affecting the pons in the cerebral  hemispheric white matter. Intracranial MR angiography does not show any large or medium vessel occlusion. There is distal vessel atherosclerotic irregularity in both posterior cerebral artery branches, including a focal stenosis at the right P1 P2 junction. No significant neck vessel disease suspected, but detail is quite limited due to technical deficiency of the noncontrast neck MRA. The patient would not allow repeat imaging.  Electronically Signed   By: Nelson Chimes M.D.   On: 07/01/2019 18:27   Mr Brain Wo Contrast (neuro Protocol)  Result Date: 07/01/2019 CLINICAL DATA:  Acute severe headache. Arterial dissection suspected. Possible temporal arteritis. EXAM: MRI HEAD WITHOUT CONTRAST MRA HEAD WITHOUT CONTRAST MRA NECK WITHOUT CONTRAST TECHNIQUE: Multiplanar, multiecho pulse sequences of the brain and surrounding structures were obtained without intravenous contrast. Angiographic images of the Circle of Willis were obtained using MRA technique without intravenous contrast. Angiographic images of the neck were obtained using MRA technique without intravenous contrast. Carotid stenosis measurements (when applicable) are obtained utilizing NASCET criteria, using the distal internal carotid diameter as the denominator. COMPARISON:  12/19/2006 FINDINGS: MRI HEAD FINDINGS Brain: Diffusion imaging shows acute infarction at the parieto-occipital junction region on the right affecting an area measuring approximately 5 x 2 x 3 cm. Mild swelling but no visible hemorrhage. No other acute finding. There chronic small-vessel ischemic changes of the pons. No focal cerebellar insult. Cerebral hemispheres show moderate chronic small-vessel ischemic changes throughout the deep and subcortical hemispheric white matter. No mass lesion, hydrocephalus or extra-axial collection. Vascular: Major vessels at the base of the brain show flow. Skull and upper cervical spine: Negative Sinuses/Orbits: Clear/normal Other: None MRA HEAD FINDINGS Both internal carotid arteries are widely patent into the brain. No siphon stenosis. The anterior and middle cerebral vessels are patent without proximal stenosis, aneurysm or vascular malformation. Both vertebral arteries are widely patent to the basilar. No basilar stenosis. Posterior circulation branch vessels show flow. There is atherosclerotic narrowing and irregularity of the PCA branches, with a focal stenosis at the  right P1 P2 junction. MRA NECK FINDINGS The noncontrast exam is technically deficient but the patient could not tolerate repeat. It can be stated that there is antegrade flow in both vertebral arteries with the left being dominant. Both vertebral arteries reach the basilar. No basilar stenosis is seen of the proximal vessel. Little information can be obtained regarding the carotid arteries in the neck. Both common carotid arteries do show antegrade flow and both internal carotid arteries do appear to be patent. IMPRESSION: Acute infarction at the right parieto-occipital junction affecting a region of brain measuring about 5 x 2 x 3 cm. No evidence of hemorrhage. Moderate chronic small-vessel ischemic changes elsewhere affecting the pons in the cerebral hemispheric white matter. Intracranial MR angiography does not show any large or medium vessel occlusion. There is distal vessel atherosclerotic irregularity in both posterior cerebral artery branches, including a focal stenosis at the right P1 P2 junction. No significant neck vessel disease suspected, but detail is quite limited due to technical deficiency of the noncontrast neck MRA. The patient would not allow repeat imaging. Electronically Signed   By: Nelson Chimes M.D.   On: 07/01/2019 18:27     Transthoracic Echocardiogram (07/02/2019) IMPRESSIONS    1. Left ventricular ejection fraction, by visual estimation, is 60 to 65%. The left ventricle has normal function. Normal left ventricular size. There is mildly increased left ventricular hypertrophy.  2. Left ventricular diastolic Doppler parameters are consistent with impaired relaxation pattern of LV diastolic filling.  3.  Trivial pericardial effusion is present.  4. The aortic valve is tricuspid Aortic valve regurgitation was not visualized by color flow Doppler. Structurally normal aortic valve, with no evidence of sclerosis or stenosis.  5. The tricuspid valve is normal in structure. Tricuspid  valve regurgitation is trivial.  6. Global right ventricle has normal systolic function.The right ventricular size is normal. No increase in right ventricular wall thickness.  7. Mild mitral annular calcification.  8. The mitral valve is normal in structure. No evidence of mitral valve regurgitation. No evidence of mitral stenosis.  9. Right atrial size was normal. 10. Left atrial size was normal. 11. The tricuspid regurgitant velocity is 2.30 m/s, and with an assumed right atrial pressure of 3 mmHg, the estimated right ventricular systolic pressure is normal at 24.2 mmHg. 12. The inferior vena cava is normal in size with greater than 50% respiratory variability, suggesting right atrial pressure of 3 mmHg.  FINDINGS  Left Ventricle: Left ventricular ejection fraction, by visual estimation, is 60 to 65%. The left ventricle has normal function. No evidence of left ventricular regional wall motion abnormalities. There is mildly increased left ventricular hypertrophy.  Normal left ventricular size. Spectral Doppler shows Left ventricular diastolic Doppler parameters are consistent with impaired relaxation pattern of LV diastolic filling.  Right Ventricle: The right ventricular size is normal. No increase in right ventricular wall thickness. Global RV systolic function is has normal systolic function. The tricuspid regurgitant velocity is 2.30 m/s, and with an assumed right atrial pressure  of 3 mmHg, the estimated right ventricular systolic pressure is normal at 24.2 mmHg.  Left Atrium: Left atrial size was normal in size.  Right Atrium: Right atrial size was normal in size  Pericardium: Trivial pericardial effusion is present.  Mitral Valve: The mitral valve is normal in structure. Mild mitral annular calcification. No evidence of mitral valve stenosis by observation. No evidence of mitral valve regurgitation.  Tricuspid Valve: The tricuspid valve is normal in structure. Tricuspid valve  regurgitation is trivial by color flow Doppler.  Aortic Valve: The aortic valve is tricuspid. Aortic valve regurgitation was not visualized by color flow Doppler. The aortic valve is structurally normal, with no evidence of sclerosis or stenosis.  Pulmonic Valve: The pulmonic valve was normal in structure. Pulmonic valve regurgitation is not visualized by color flow Doppler.  Aorta: The aortic root is normal in size and structure.  Venous: The inferior vena cava is normal in size with greater than 50% respiratory variability, suggesting right atrial pressure of 3 mmHg.  IAS/Shunts: No atrial level shunt detected by color flow Doppler.   Subjective: Sight is coming back slowly. Strength is improved.  Discharge Exam: Vitals:   07/08/19 0759 07/08/19 1100  BP: (!) 144/86 134/82  Pulse: 76 75  Resp: 18 18  Temp: 98.7 F (37.1 C) 97.9 F (36.6 C)  SpO2: 98% 98%   Vitals:   07/07/19 2336 07/08/19 0413 07/08/19 0759 07/08/19 1100  BP: 124/73 127/76 (!) 144/86 134/82  Pulse: 68 78 76 75  Resp: '18 17 18 18  ' Temp: 98.7 F (37.1 C) 98.7 F (37.1 C) 98.7 F (37.1 C) 97.9 F (36.6 C)  TempSrc: Oral Oral Oral Oral  SpO2: 100% 92% 98% 98%  Weight:      Height:        General: Pt is alert, awake, not in acute distress Cardiovascular: RRR, S1/S2 +, no rubs, no gallops Respiratory: CTA bilaterally, no wheezing, no rhonchi Abdominal: Soft, NT, ND, bowel sounds +  Extremities: no edema, no cyanosis    The results of significant diagnostics from this hospitalization (including imaging, microbiology, ancillary and laboratory) are listed below for reference.     Microbiology: Recent Results (from the past 240 hour(s))  SARS CORONAVIRUS 2 (TAT 6-24 HRS) Nasopharyngeal Nasopharyngeal Swab     Status: None   Collection Time: 07/01/19  6:59 PM   Specimen: Nasopharyngeal Swab  Result Value Ref Range Status   SARS Coronavirus 2 NEGATIVE NEGATIVE Final    Comment:  (NOTE) SARS-CoV-2 target nucleic acids are NOT DETECTED. The SARS-CoV-2 RNA is generally detectable in upper and lower respiratory specimens during the acute phase of infection. Negative results do not preclude SARS-CoV-2 infection, do not rule out co-infections with other pathogens, and should not be used as the sole basis for treatment or other patient management decisions. Negative results must be combined with clinical observations, patient history, and epidemiological information. The expected result is Negative. Fact Sheet for Patients: SugarRoll.be Fact Sheet for Healthcare Providers: https://www.woods-mathews.com/ This test is not yet approved or cleared by the [place] FDA and  has been authorized for detection and/or diagnosis of SARS-CoV-2 by FDA under an Emergency Use Authorization (EUA). This EUA will remain  in effect (meaning this test can be used) for the duration of the COVID-19 declaration under Section 56 4(b)(1) of the Act, 21 U.S.C. section 360bbb-3(b)(1), unless the authorization is terminated or revoked sooner. Performed at [facility], [place] [address], [place], [place] [address]   Novel Coronavirus, NAA (Hosp order, Send-out to Ref Lab; TAT 18-24 hrs     Status: None   Collection Time: 07/06/19  2:45 PM   Specimen: Nasopharyngeal Swab; Respiratory  Result Value Ref Range Status   SARS-CoV-2, NAA NOT DETECTED NOT DETECTED Final    Comment: (NOTE) This nucleic acid amplification test was developed and its performance characteristics determined by [employer]. Nucleic acid amplification tests include PCR and TMA. This test has not been FDA cleared or approved. This test has been authorized by FDA under an Emergency Use Authorization (EUA). This test is only authorized for the duration of time the declaration that circumstances exist justifying the authorization of the emergency use of in vitro  diagnostic tests for detection of SARS-CoV-2 virus and/or diagnosis of COVID-19 infection under section 564(b)(1) of the Act, 21 U.S.C. 517OHY-0(V) (1), unless the authorization is terminated or revoked sooner. When diagnostic testing is negative, the possibility of a false negative result should be considered in the context of a patient's recent exposures and the presence of clinical signs and symptoms consistent with COVID-19. An individual without symptoms of COVID- 19 and who is not shedding SARS-CoV-2 vi rus would expect to have a negative (not detected) result in this assay. Performed At: [facility] [address] [place], [place] [id number] Rush Farmer MD WN:[id number]    [place]  Final    Comment: Performed at [facility], [place] [address], [place],  [address]     Labs: BNP (last 3 results) No results for input(s): BNP in the last 8760 hours. Basic Metabolic Panel: Recent Labs  Lab 07/03/19 0318 07/04/19 0748 07/06/19 0412 07/07/19 0358 07/08/19 0333  NA 138 138 138 137 137  K 2.9* 4.3 3.3* 3.3* 3.5  CL 100 103 99 100 99  CO2 '28 28 30 29 28  ' GLUCOSE 136* 223* 138* 161* 131*  BUN '14 13 10 11 10  ' CREATININE 0.80 0.79 0.81 0.76 0.77  CALCIUM  8.6* 8.7* 8.7* 8.5* 8.7*   Liver Function Tests: No results for input(s): AST, ALT, ALKPHOS, BILITOT, PROT, ALBUMIN in the last 168 hours. No results for input(s): LIPASE, AMYLASE in the last 168 hours. No results for input(s): AMMONIA in the last 168 hours. CBC: Recent Labs  Lab 07/02/19 0411 07/03/19 0318 07/06/19 0412  WBC 11.1* 9.5 10.2  HGB 16.2* 14.6 13.7  HCT 47.4* 43.0 38.4  MCV 87.6 87.8 87.1  PLT 329 286 245   Cardiac Enzymes: No results for input(s): CKTOTAL, CKMB, CKMBINDEX, TROPONINI in the last 168 hours. BNP: Invalid input(s): POCBNP CBG: Recent Labs  Lab 07/07/19 1129 07/07/19 1547 07/07/19 2102 07/08/19 0636 07/08/19 1139  GLUCAP 205* 147*  207* 120* 219*   D-Dimer No results for input(s): DDIMER in the last 72 hours. Hgb A1c No results for input(s): HGBA1C in the last 72 hours. Lipid Profile No results for input(s): CHOL, HDL, LDLCALC, TRIG, CHOLHDL, LDLDIRECT in the last 72 hours. Thyroid function studies No results for input(s): TSH, T4TOTAL, T3FREE, THYROIDAB in the last 72 hours.  Invalid input(s): FREET3 Anemia work up No results for input(s): VITAMINB12, FOLATE, FERRITIN, TIBC, IRON, RETICCTPCT in the last 72 hours. Urinalysis    Component Value Date/Time   COLORURINE YELLOW 07/01/2019 1508   APPEARANCEUR CLEAR 07/01/2019 1508   LABSPEC 1.008 07/01/2019 1508   PHURINE 6.0 07/01/2019 1508   GLUCOSEU >=500 (A) 07/01/2019 1508   HGBUR NEGATIVE 07/01/2019 1508   BILIRUBINUR NEGATIVE 07/01/2019 1508   KETONESUR NEGATIVE 07/01/2019 1508   PROTEINUR 100 (A) 07/01/2019 1508   UROBILINOGEN 0.2 04/21/2009 1144   NITRITE NEGATIVE 07/01/2019 1508   LEUKOCYTESUR NEGATIVE 07/01/2019 1508   Sepsis Labs Invalid input(s): PROCALCITONIN,  WBC,  LACTICIDVEN Microbiology Recent Results (from the past 240 hour(s))  SARS CORONAVIRUS 2 (TAT 6-24 HRS) Nasopharyngeal Nasopharyngeal Swab     Status: None   Collection Time: 07/01/19  6:59 PM   Specimen: Nasopharyngeal Swab  Result Value Ref Range Status   SARS Coronavirus 2 NEGATIVE NEGATIVE Final    Comment: (NOTE) SARS-CoV-2 target nucleic acids are NOT DETECTED. The SARS-CoV-2 RNA is generally detectable in upper and lower respiratory specimens during the acute phase of infection. Negative results do not preclude SARS-CoV-2 infection, do not rule out co-infections with other pathogens, and should not be used as the sole basis for treatment or other patient management decisions. Negative results must be combined with clinical observations, patient history, and epidemiological information. The expected result is Negative. Fact Sheet for  Patients: SugarRoll.be Fact Sheet for Healthcare Providers: https://www.woods-mathews.com/ This test is not yet approved or cleared by the [place] FDA and  has been authorized for detection and/or diagnosis of SARS-CoV-2 by FDA under an Emergency Use Authorization (EUA). This EUA will remain  in effect (meaning this test can be used) for the duration of the COVID-19 declaration under Section 56 4(b)(1) of the Act, 21 U.S.C. section 360bbb-3(b)(1), unless the authorization is terminated or revoked sooner. Performed at [facility], [place] [address] [place], [place] [address]   Novel Coronavirus, NAA (Hosp order, Send-out to Ref Lab; TAT 18-24 hrs     Status: None   Collection Time: 07/06/19  2:45 PM   Specimen: Nasopharyngeal Swab; Respiratory  Result Value Ref Range Status   SARS-CoV-2, NAA NOT DETECTED NOT DETECTED Final    Comment: (NOTE) This nucleic acid amplification test was developed and its performance characteristics determined by [employer]. Nucleic acid amplification tests include PCR and  TMA. This test has not been FDA cleared or approved. This test has been authorized by FDA under an Emergency Use Authorization (EUA). This test is only authorized for the duration of time the declaration that circumstances exist justifying the authorization of the emergency use of in vitro diagnostic tests for detection of SARS-CoV-2 virus and/or diagnosis of COVID-19 infection under section 564(b)(1) of the Act, 21 U.S.C. 943NNN-0(V) (1), unless the authorization is terminated or revoked sooner. When diagnostic testing is negative, the possibility of a false negative result should be considered in the context of a patient's recent exposures and the presence of clinical signs and symptoms consistent with COVID-19. An individual without symptoms of COVID- 19 and who is not shedding SARS-CoV-2 vi rus would expect to have  a negative (not detected) result in this assay. Performed At: [facility] [address] [place], [place] [id number] Rush Farmer MD [id number]    [place]  Final    Comment: Performed at [facility], [place] [address], [place], [place] [address]     Time coordinating discharge: 35 minutes  SIGNED:   Cordelia Poche, MD [facility] 07/08/2019, 2:16 PM

## 2019-07-08 NOTE — Discharge Instructions (Signed)
Meagan [name],  You were in the hospital because of a stroke. Your medications have been adjusted. You will need to refrain from driving until cleared by an ophthalmologist. Please follow-up with the neurology physician in addition to your primary care physician.   Carbohydrate Counting For People With Diabetes  Why Is Carbohydrate Counting Important?  Counting carbohydrate servings may help you control your blood glucose level so that you feel better.   The balance between the carbohydrates you eat and insulin determines what your blood glucose level will be after eating.   Carbohydrate counting can also help you plan your meals. Which Foods Have Carbohydrates? Foods with carbohydrates include:  Breads, crackers, and cereals   Pasta, rice, and grains   Starchy vegetables, such as potatoes, corn, and peas   Beans and legumes   Milk, soy milk, and yogurt   Fruits and fruit juices   Sweets, such as cakes, cookies, ice cream, jam, and jelly Carbohydrate Servings In diabetes meal planning, 1 serving of a food with carbohydrate has about 15 grams of carbohydrate:  Check serving sizes with measuring cups and spoons or a food scale.   Read the Nutrition Facts on food labels to find out how many grams of carbohydrate are in foods you eat. The food lists in this handout show portions that have about 15 grams of carbohydrate.  Tips Meal Planning Tips  An Eating Plan tells you how many carbohydrate servings to eat at your meals and snacks. For many adults, eating 3 to 5 servings of carbohydrate foods at each meal and 1 or 2 carbohydrate servings for each snack works well.   In a healthy daily Eating Plan, most carbohydrates come from:   At least 6 servings of fruits and nonstarchy vegetables   At least 6 servings of grains, beans, and starchy vegetables, with at least 3 servings from whole grains   At least 2 servings of milk or milk products  Check your blood glucose level  regularly. It can tell you if you need to adjust when you eat carbohydrates.   Eating foods that have fiber, such as whole grains, and having very few salty foods is good for your health.   Eat 4 to 6 ounces of meat or other protein foods (such as soybean burgers) each day. Choose low-fat sources of protein, such as lean beef, lean pork, chicken, fish, low-fat cheese, or vegetarian foods such as soy.   Eat some healthy fats, such as olive oil, canola oil, and nuts.   Eat very little saturated fats. These unhealthy fats are found in butter, cream, and high-fat meats, such as bacon and sausage.   Eat very little or no trans fats. These unhealthy fats are found in all foods that list partially hydrogenated oil as an ingredient.  Label Reading Tips The Nutrition Facts panel on a label lists the grams of total carbohydrate in 1 standard serving. The label's standard serving may be larger or smaller than 1 carbohydrate serving. To figure out how many carbohydrate servings are in the food:  First, look at the label's standard serving size.   Check the grams of total carbohydrate. This is the amount of carbohydrate in 1 standard serving.   Divide the grams of total carbohydrate by 15. This number equals the number of carbohydrate servings in 1 standard serving. Remember: 1 carbohydrate serving is 15 grams of carbohydrate.   Note: You may ignore the grams of sugars on the Nutrition Facts panel because they  are included in the grams of total carbohydrate.  Foods Recommended 1 serving = about 15 grams of carbohydrate Starches  1 slice bread (1 ounce)   1 tortilla (6-inch size)    large bagel (1 ounce)   2 taco shells (5-inch size)    hamburger or hot dog bun ( ounce)    cup ready-to-eat unsweetened cereal    cup cooked cereal   1 cup broth-based soup   4 to 6 small crackers   1/3 cup pasta or rice (cooked)    cup beans, peas, corn, sweet potatoes, winter squash, or mashed  or boiled potatoes (cooked)    large baked potato (3 ounces)    ounce pretzels, potato chips, or tortilla chips   3 cups popcorn (popped) Fruit  1 small fresh fruit ( to 1 cup)    cup canned or frozen fruit   2 tablespoons dried fruit (blueberries, cherries, cranberries, mixed fruit, raisins)   17 small grapes (3 ounces)   1 cup melon or berries    cup unsweetened fruit juice Milk  1 cup fat-free or reduced-fat milk   1 cup soy milk   2/3 cup (6 ounces) nonfat yogurt sweetened with sugar-free sweetener Sweets and Desserts  2-inch square cake (unfrosted)   2 small cookies (2/3 ounce)    cup ice cream or frozen yogurt    cup sherbet or sorbet   1 tablespoon syrup, jam, jelly, table sugar, or honey   2 tablespoons light syrup Other Foods  Count 1 cup raw vegetables or  cup cooked nonstarchy vegetables as zero (0) carbohydrate servings or free foods. If you eat 3 or more servings at one meal, count them as 1 carbohydrate serving.   Foods that have less than 20 calories in each serving also may be counted as zero carbohydrate servings or free foods.   Count 1 cup of casserole or other mixed foods as 2 carbohydrate servings.  Carbohydrate Counting for People with Diabetes Sample 1-Day Menu  Breakfast 1 extra-small banana (1 carbohydrate serving)  1 cup low-fat or fat-free milk (1 carbohydrate serving)  1 slice whole wheat bread (1 carbohydrate serving)  1 teaspoon margarine  Lunch 2 ounces [place] slices  2 slices whole wheat bread (2 carbohydrate servings)  2 lettuce leaves  4 celery sticks  4 carrot sticks  1 medium apple (1 carbohydrate serving)  1 cup low-fat or fat-free milk (1 carbohydrate serving)  Afternoon Snack 2 tablespoons raisins (1 carbohydrate serving)  3/4 ounce unsalted mini pretzels (1 carbohydrate serving)  Evening Meal 3 ounces lean roast beef  1/2 large baked potato (2 carbohydrate servings)  1 tablespoon reduced-fat sour cream   1/2 cup green beans  1 tablespoon light salad dressing  1 whole wheat dinner roll (1 carbohydrate serving)  1 teaspoon margarine  1 cup melon balls (1 carbohydrate serving)  Evening Snack 2 tablespoons unsalted nuts   Carbohydrate Counting for People with Diabetes Vegan Sample 1-Day Menu  Breakfast 1 cup cooked oatmeal (2 carbohydrate servings)   cup blueberries (1 carbohydrate serving)  2 tablespoons flaxseeds  1 cup soymilk fortified with calcium and vitamin D  1 cup coffee  Lunch 2 slices whole wheat bread (2 carbohydrate servings)   cup baked tofu   cup lettuce  2 slices tomato  2 slices avocado   cup baby carrots  1 orange (1 carbohydrate serving)  1 cup soymilk fortified with calcium and vitamin D   Evening Meal Burrito made with: 1  6-inch corn tortilla (1 carbohydrate serving)  1 cup refried vegetarian beans (1 carbohydrate serving)   cup chopped tomatoes   cup lettuce   cup salsa  1/3 cup brown rice (1 carbohydrate serving)  1 tablespoon olive oil for rice   cup zucchini   Evening Snack 6 small whole grain crackers (1 carbohydrate serving)  2 apricots ( carbohydrate serving)   cup unsalted peanuts ( carbohydrate serving)     Carbohydrate Counting for People with Diabetes Vegetarian (Lacto-Ovo) Sample 1-Day Menu  Breakfast 1 cup cooked oatmeal (2 carbohydrate servings)   cup blueberries (1 carbohydrate serving)  2 tablespoons flaxseeds  1 egg  1 cup 1% milk (1 carbohydrate serving)  1 cup coffee  Lunch 2 slices whole wheat bread (2 carbohydrate servings)  2 ounces low-fat cheese   cup lettuce  2 slices tomato  2 slices avocado   cup baby carrots  1 orange (1 carbohydrate serving)  1 cup unsweetened tea  Evening Meal Burrito made with: 1 6-inch corn tortilla (1 carbohydrate serving)   cup refried vegetarian beans (1 carbohydrate serving)   cup tomatoes   cup lettuce   cup salsa  1/3 cup brown rice (1 carbohydrate serving)  1 tablespoon  olive oil for rice   cup zucchini  1 cup 1% milk (1 carbohydrate serving)  Evening Snack 6 small whole grain crackers (1 carbohydrate serving)  2 apricots ( carbohydrate serving)   cup unsalted peanuts ( carbohydrate serving)    Copyright 2020  Academy of Nutrition and Dietetics. All rights reserved.  Using Nutrition Labels: Carbohydrate   Serving Size   Look at the serving size. All the information on the label is based on this portion.  Servings Per Container   The number of servings contained in the package.  Guidelines for Carbohydrate   Look at the total grams of carbohydrate in the serving size.   1 carbohydrate choice = 15 grams of carbohydrate. Range of Carbohydrate Grams Per Choice  Carbohydrate Grams/Choice Carbohydrate Choices  6-10   11-20 1  21-25 1  26-35 2  36-40 2  41-50 3  51-55 3  56-65 4  66-70 4  71-80 5    Copyright 2020  Academy of Nutrition and Dietetics. All rights reserved.  Corrin Parker, MS, RD, LDN Clinical Dietitian Office phone [phone]

## 2019-07-08 NOTE — TOC Transition Note (Signed)
Transition of Care [facility]) - CM/SW Discharge Note   Patient Details  Name: Meagan [name] MRN: [id number] Date of Birth: 10-20-1950  [facility]) CM/SW Contact:  Weston Anna, LCSW Phone Number: 07/08/2019, 1:33 PM   Clinical Narrative:     Patient received authorization for patient to go to [facility] today. CSW paged MD for DC summary/ orders. Patient will need transportation via [facility]. Daughter at beside and she is agreeable to transportation. Please call report to [phone]  Final next level of care: Skilled Nursing Facility Barriers to Discharge: No Barriers Identified   Patient Goals and CMS Choice Patient states their goals for this hospitalization and ongoing recovery are:: Pt is agreeable to rehab CMS Medicare.gov Compare Post Acute Care list provided to:: Patient Choice offered to / list presented to : Patient  Discharge Placement              Patient chooses bed at: [facility] [place] Patient to be transferred to facility by: [place] Name of family member notified: Anderson [place] Patient and family notified of of transfer: 07/08/19  Discharge Plan and Services In-house Referral: Clinical Social Work Discharge Planning Services: NA Post Acute Care Choice: [place]          DME Arranged: N/A DME Agency: NA       HH Arranged: NA HH Agency: NA        Social Determinants of Health (SDOH) Interventions     Readmission Risk Interventions No flowsheet data found.

## 2019-07-22 ENCOUNTER — Telehealth: Payer: Self-pay | Admitting: *Deleted

## 2019-07-22 NOTE — Telephone Encounter (Signed)
[employer] to ship a 30 day cardiac event monitor to the patients home.  Instructions included in the monitor kit.  [employer] can be reached at [phone].  Please call [facility] at [phone] if you have any questions.

## 2019-07-24 ENCOUNTER — Ambulatory Visit: Payer: BC Managed Care – PPO | Admitting: Family Medicine

## 2019-07-24 ENCOUNTER — Encounter: Payer: Self-pay | Admitting: Family Medicine

## 2019-07-24 ENCOUNTER — Other Ambulatory Visit: Payer: Self-pay

## 2019-07-24 VITALS — BP 150/80 | HR 89 | Temp 96.9°F | Resp 16 | Ht 65.0 in | Wt 205.3 lb

## 2019-07-24 DIAGNOSIS — I10 Essential (primary) hypertension: Secondary | ICD-10-CM | POA: Diagnosis not present

## 2019-07-24 DIAGNOSIS — I6523 Occlusion and stenosis of bilateral carotid arteries: Secondary | ICD-10-CM

## 2019-07-24 DIAGNOSIS — E1169 Type 2 diabetes mellitus with other specified complication: Secondary | ICD-10-CM | POA: Diagnosis not present

## 2019-07-24 DIAGNOSIS — E785 Hyperlipidemia, unspecified: Secondary | ICD-10-CM | POA: Diagnosis not present

## 2019-07-24 DIAGNOSIS — I6930 Unspecified sequelae of cerebral infarction: Secondary | ICD-10-CM

## 2019-07-24 DIAGNOSIS — G4733 Obstructive sleep apnea (adult) (pediatric): Secondary | ICD-10-CM | POA: Diagnosis not present

## 2019-07-24 DIAGNOSIS — Z23 Encounter for immunization: Secondary | ICD-10-CM | POA: Diagnosis not present

## 2019-07-24 MED ORDER — AMLODIPINE BESYLATE 5 MG PO TABS: 5.0000 mg | ORAL_TABLET | Freq: Every day | ORAL | 1 refills | Status: DC

## 2019-07-24 MED ORDER — OZEMPIC (1 MG/DOSE) 2 MG/1.5ML ~~LOC~~ SOPN: 1.0000 mg | PEN_INJECTOR | SUBCUTANEOUS | 2 refills | Status: DC

## 2019-07-24 MED ORDER — LEVEMIR FLEXTOUCH 100 UNIT/ML ~~LOC~~ SOPN: 25.0000 [IU] | PEN_INJECTOR | Freq: Every day | SUBCUTANEOUS | 2 refills | Status: DC

## 2019-07-24 MED ORDER — HYDRALAZINE HCL 10 MG PO TABS: 10.0000 mg | ORAL_TABLET | Freq: Three times a day (TID) | ORAL | 0 refills | Status: DC | PRN

## 2019-07-24 MED ORDER — CLOPIDOGREL BISULFATE 75 MG PO TABS: 75.0000 mg | ORAL_TABLET | Freq: Every day | ORAL | 1 refills | Status: DC

## 2019-07-24 MED ORDER — ATORVASTATIN CALCIUM 80 MG PO TABS: 80.0000 mg | ORAL_TABLET | Freq: Every day | ORAL | 0 refills | Status: DC

## 2019-07-24 MED ORDER — METFORMIN HCL ER 750 MG PO TB24: 750.0000 mg | ORAL_TABLET | Freq: Two times a day (BID) | ORAL | 1 refills | Status: DC

## 2019-07-24 MED ORDER — OLMESARTAN MEDOXOMIL-HCTZ 40-25 MG PO TABS: 1.0000 | ORAL_TABLET | Freq: Every day | ORAL | 0 refills | Status: DC

## 2019-07-24 NOTE — Patient Instructions (Signed)
Levemir: please check glucose every morning and adjust by 2 units every 3 days to keep glucose between 100-140

## 2019-07-24 NOTE — Progress Notes (Signed)
Name: Meagan [name]   MRN: [id number]    DOB: 03/26/1951   Date:07/24/2019       Progress Note  Subjective  Chief Complaint  Chief Complaint  Patient presents with  . Hospitalization Follow-up    Follow up from discharge from rehabilatation center after having a stroke  . Headache    Eye doctor states that she is having headache from straining to see.    HPI    S/p CVA: she developed right occipital headaches and bp was spiking . She went to eye doctor and bp was over 200's , he called me and we advised to send her to [facility], she arrived to [facility] with her daughter. CTA showed 45 % stenosis proximal right internal carotid artery , 60 % stenosis right external carotid artery , left distal common external carotid was 30 % , echo showed normal EF, LDL was very high at 199 and during visit it was changed from Crestor to Lipitor 80. She is also on Plavix 75 mg and aspirin 325 mg . No side effects of medication. She initially lost vision bilaterally and has some dysarthria, but vision has improved , only has peripheral vision from left side. Using a walker to assist with ambulation. She is going to [facility] , started this week. Finished ST  DMII: she has a long history of not being compliant with her medication, but she states she will be now, she moved in with her sister and losing her vision has scared her. She had repeat eye exam yesterday and since she has photophobia we will not exam it today. She was discharge on Levemir ( in place of Lantus) and advised to resume ozempic and metformin . She states difficulty taking Metformin ER two at the same time, advised to take it BID . Glucose fasting has been from 130's to 170's. Discussed how to titrate levemir, we will also give her information about diabetic diet Offered diabetic teaching class but she wants to hold off for now  HTN: bp at home has been mostly at goal 120's-130's/80's, however goes up to 150's. We will add prn hydralazine. She is also due  for repeat CMP today    Patient Active Problem List   Diagnosis Date Noted  . History of CVA with residual deficit 07/01/2019  . Leukocytosis 07/01/2019  . Polycythemia 07/01/2019  . Morbid obesity ([place]) 10/22/2018  . Non compliance with medical treatment 03/26/2018  . Dyslipidemia associated with type 2 diabetes mellitus ([place]) 12/04/2016  . Uncontrolled type 2 diabetes mellitus with microalbuminuria, without long-term current use of insulin ([place]) 12/04/2016  . Seasonal allergies 02/01/2016  . OSA (obstructive sleep apnea) 10/06/2015  . Hypertensive retinopathy 10/06/2015  . Thyroid cyst 10/06/2015  . Dyslipidemia 10/06/2015  . Type 2 diabetes, uncontrolled, with neuropathy ([place]) 10/06/2015  . History of pneumonia 10/06/2015  . Chronic neck pain 10/06/2015  . Occlusion and stenosis of carotid artery without mention of cerebral infarction   . Hypertension, benign     Past Surgical History:  Procedure Laterality Date  . APPENDECTOMY  2000s  . BIOPSY THYROID  2006   [facility]  . BREAST EXCISIONAL BIOPSY Left 2000s   neg  . CARPAL TUNNEL RELEASE Left March 2015  . COLONOSCOPY  2011   DR.WOHL  . COLONOSCOPY WITH PROPOFOL N/A 07/11/2017   Procedure: COLONOSCOPY WITH PROPOFOL;  Surgeon: Jonathon Bellows, MD;  Location: [facility] ENDOSCOPY;  Service: Gastroenterology;  Laterality: N/A;  . ganglion cyst removal     .  KNEE SURGERY Left 08/2009  . KNEE SURGERY Right 04/07/2010  . ROTATOR CUFF REPAIR Right 2008  . TUBAL LIGATION      Family History  Problem Relation Age of Onset  . Breast cancer Maternal Aunt   . Hypertension Mother   . Kidney Stones Mother   . Hypertension Father   . Congestive Heart Failure Father   . Hypertension Paternal Grandfather   . Diabetes Sister   . Vision loss Sister   . Varicose Veins Sister   . Hypertension Sister   . Breast cancer Sister 59       Double Mastectomy  . Hypertension Sister     Social History   Socioeconomic History  . Marital status:  Widowed    Spouse name: Not on file  . Number of children: 4  . Years of education: Not on file  . Highest education level: Master's degree (e.g., MA, MS, MEng, MEd, MSW, MBA)  Occupational History  . Occupation: Higher education careers adviser   Social Needs  . Financial resource strain: Not hard at all  . Food insecurity    Worry: Never true    Inability: Never true  . Transportation needs    Medical: No    Non-medical: No  Tobacco Use  . Smoking status: Never Smoker  . Smokeless tobacco: Never Used  Substance and Sexual Activity  . Alcohol use: No  . Drug use: No  . Sexual activity: Not Currently    Partners: Male    Comment: Widow for 14 years  Lifestyle  . Physical activity    Days per week: 0 days    Minutes per session: 0 min  . Stress: Not at all  Relationships  . Social connections    Talks on phone: More than three times a week    Gets together: More than three times a week    Attends religious service: More than 4 times per year    Active member of club or organization: Yes    Attends meetings of clubs or organizations: More than 4 times per year    Relationship status: Widowed  . Intimate partner violence    Fear of current or ex partner: No    Emotionally abused: No    Physically abused: No    Forced sexual activity: No  Other Topics Concern  . Not on file  Social History Narrative   She had a stroke on 06/2019, she has left peripheral vision loss since stroke   She moved in with her youngest sister      Current Outpatient Medications:  .  amLODipine (NORVASC) 5 MG tablet, Take 1 tablet (5 mg total) by mouth daily., Disp: 90 tablet, Rfl: 1 .  aspirin 325 MG tablet, Take 1 tablet (325 mg total) by mouth daily. Take for 3 months only., Disp: , Rfl:  .  atorvastatin (LIPITOR) 80 MG tablet, Take 1 tablet (80 mg total) by mouth daily at 6 PM., Disp: 90 tablet, Rfl: 0 .  clopidogrel (PLAVIX) 75 MG tablet, Take 1 tablet (75 mg total) by mouth daily., Disp: 90 tablet, Rfl:  1 .  levocetirizine (XYZAL) 5 MG tablet, Take 5 mg by mouth as needed for allergies., Disp: , Rfl:  .  metFORMIN (GLUCOPHAGE-XR) 750 MG 24 hr tablet, Take 1 tablet (750 mg total) by mouth 2 (two) times daily., Disp: 180 tablet, Rfl: 1 .  olmesartan-hydrochlorothiazide (BENICAR HCT) 40-25 MG tablet, Take 1 tablet by mouth daily., Disp: 90 tablet, Rfl: 0 .  polyethylene glycol (MIRALAX / GLYCOLAX) 17 g packet, Take 17 g by mouth daily., Disp: 14 each, Rfl: 0 .  Semaglutide, 1 MG/DOSE, (OZEMPIC, 1 MG/DOSE,) 2 MG/1.5ML SOPN, Inject 1 mg into the skin every 7 (seven) days., Disp: 9 mL, Rfl: 2 .  traMADol (ULTRAM) 50 MG tablet, Take 1 tablet (50 mg total) by mouth every 6 (six) hours as needed for moderate pain., Disp: 10 tablet, Rfl: 0 .  hydrALAZINE (APRESOLINE) 10 MG tablet, Take 1 tablet (10 mg total) by mouth 3 (three) times daily as needed. If bp goes above 150/90, Disp: 30 tablet, Rfl: 0 .  Insulin Detemir (LEVEMIR FLEXTOUCH) 100 UNIT/ML Pen, Inject 25-50 Units into the skin daily., Disp: 15 mL, Rfl: 2  Allergies  Allergen Reactions  . Wilder Glade [Dapagliflozin] Rash    Pt reports that she developed a rash when she took [place] and she would like it to be added as an allergy  . Keflex [Cephalexin] Rash    I personally reviewed active problem list, medication list, allergies, family history, social history with the patient/caregiver today.   ROS  Constitutional: Negative for fever or weight change.  Respiratory: Negative for cough and shortness of breath.   Cardiovascular: Negative for chest pain or palpitations.  Gastrointestinal: Negative for abdominal pain, no bowel changes.  Musculoskeletal: Negative for gait problem or joint swelling.  Skin: Negative for rash.  Neurological: Negative for dizziness or headache.  No other specific complaints in a complete review of systems (except as listed in HPI above).  Objective  Vitals:   07/24/19 1153  BP: (!) 150/80  Pulse: 89  Resp: 16   Temp: (!) 96.9 F (36.1 C)  TempSrc: Temporal  SpO2: 97%  Weight: 205 lb 4.8 oz (93.1 kg)  Height: 5\' 5"  (1.651 m)    Body mass index is 34.16 kg/m.  Physical Exam  Constitutional: Patient appears well-developed and well-nourished. Obese No distress.  HEENT: head atraumatic, normocephalic, wearing shades because of photophobia, unable to see on left periphery  Cardiovascular: Normal rate, regular rhythm and normal heart sounds.  No murmur heard. No BLE edema. Pulmonary/Chest: Effort normal and breath sounds normal. No respiratory distress. Abdominal: Soft.  There is no tenderness. Psychiatric: Patient has a normal mood and affect. behavior is normal. Judgment and thought content normal. She cried a little but states adjusting to the new diagnosis but happy she is getting better   Recent Results (from the past 2160 hour(s))  HM DIABETES EYE EXAM     Status: Abnormal   Collection Time: 05/27/19 12:00 AM  Result Value Ref Range   HM Diabetic Eye Exam Retinopathy (A) No Retinopathy    Comment: Dr. Genia Hotter non-proliferative diabetic retinopa  Lipase, blood     Status: None   Collection Time: 07/01/19 12:04 PM  Result Value Ref Range   Lipase 33 11 - 51 U/L    Comment: Performed at [facility], [place] [address], [place], [place] [address]  Comprehensive metabolic panel     Status: Abnormal   Collection Time: 07/01/19 12:04 PM  Result Value Ref Range   Sodium 132 (L) 135 - 145 mmol/L   Potassium 3.9 3.5 - 5.1 mmol/L   Chloride 93 (L) 98 - 111 mmol/L   CO2 25 22 - 32 mmol/L   Glucose, Bld 316 (H) 70 - 99 mg/dL   BUN 11 8 - 23 mg/dL   Creatinine, Ser 0.82 0.44 - 1.00 mg/dL   Calcium 9.0 8.9 - 10.3  mg/dL   Total Protein 7.7 6.5 - 8.1 g/dL   Albumin 3.1 (L) 3.5 - 5.0 g/dL   AST 17 15 - 41 U/L   ALT 18 0 - 44 U/L   Alkaline Phosphatase 126 38 - 126 U/L   Total Bilirubin 0.7 0.3 - 1.2 mg/dL   GFR calc non Af Amer >60 >60 mL/min   GFR calc Af Amer >60 >60 mL/min    Anion gap 14 5 - 15    Comment: Performed at [facility] [address], [place], [place] [address]  CBC     Status: Abnormal   Collection Time: 07/01/19 12:04 PM  Result Value Ref Range   WBC 11.9 (H) 4.0 - 10.5 K/uL   RBC 5.40 (H) 3.87 - 5.11 MIL/uL   Hemoglobin 16.5 (H) 12.0 - 15.0 g/dL   HCT 46.8 (H) 36.0 - 46.0 %   MCV 86.7 80.0 - 100.0 fL   MCH 30.6 26.0 - 34.0 pg   MCHC 35.3 30.0 - 36.0 g/dL   RDW 12.1 11.5 - 15.5 %   Platelets 297 150 - 400 K/uL   nRBC 0.0 0.0 - 0.2 %    Comment: Performed at [facility], [place] [address], [place], [place] [address]  Sedimentation rate     Status: Abnormal   Collection Time: 07/01/19  2:30 PM  Result Value Ref Range   Sed Rate 55 (H) 0 - 22 mm/hr    Comment: Performed at [facility] [address], [place], [place] [address]  C-reactive protein     Status: Abnormal   Collection Time: 07/01/19  2:30 PM  Result Value Ref Range   CRP 1.2 (H) <1.0 mg/dL    Comment: Performed at [facility], [place] [address], [place], [place] [address]  Urinalysis, Routine w reflex microscopic     Status: Abnormal   Collection Time: 07/01/19  3:08 PM  Result Value Ref Range   Color, Urine YELLOW YELLOW   APPearance CLEAR CLEAR   Specific Gravity, Urine 1.008 1.005 - 1.030   pH 6.0 5.0 - 8.0   Glucose, UA >=500 (A) NEGATIVE mg/dL   Hgb urine dipstick NEGATIVE NEGATIVE   Bilirubin Urine NEGATIVE NEGATIVE   Ketones, ur NEGATIVE NEGATIVE mg/dL   Protein, ur 100 (A) NEGATIVE mg/dL   Nitrite NEGATIVE NEGATIVE   Leukocytes,Ua NEGATIVE NEGATIVE   RBC / HPF 0-5 0 - 5 RBC/hpf   WBC, UA 0-5 0 - 5 WBC/hpf   Bacteria, UA RARE (A) NONE SEEN   Squamous Epithelial / LPF 0-5 0 - 5   Hyaline Casts, UA PRESENT     Comment: Performed at [facility], [address] [place], [place] [address]  SARS CORONAVIRUS 2 (TAT 6-24 HRS) Nasopharyngeal Nasopharyngeal Swab     Status: None   Collection Time: 07/01/19  6:59 PM   Specimen:  Nasopharyngeal Swab  Result Value Ref Range   SARS Coronavirus 2 NEGATIVE NEGATIVE    Comment: (NOTE) SARS-CoV-2 target nucleic acids are NOT DETECTED. The SARS-CoV-2 RNA is generally detectable in upper and lower respiratory specimens during the acute phase of infection. Negative results do not preclude SARS-CoV-2 infection, do not rule out co-infections with other pathogens, and should not be used as the sole basis for treatment or other patient management decisions. Negative results must be combined with clinical observations, patient history, and epidemiological information. The expected result is Negative. Fact Sheet for Patients: SugarRoll.be Fact  Sheet for Healthcare Providers: https://www.woods-mathews.com/ This test is not yet approved or cleared by the [place] FDA and  has been authorized for detection and/or diagnosis of SARS-CoV-2 by FDA under an Emergency Use Authorization (EUA). This EUA will remain  in effect (meaning this test can be used) for the duration of the COVID-19 declaration under Section 56 4(b)(1) of the Act, 21 U.S.C. section 360bbb-3(b)(1), unless the authorization is terminated or revoked sooner. Performed at [facility], [place] [address] [place], [place] [address]   CBG monitoring, ED     Status: Abnormal   Collection Time: 07/01/19 11:21 PM  Result Value Ref Range   Glucose-Capillary 327 (H) 70 - 99 mg/dL  HIV Antibody  (Routine Testing)     Status: None   Collection Time: 07/02/19  4:11 AM  Result Value Ref Range   HIV Screen 4th Generation wRfx Non Reactive Non Reactive    Comment: (NOTE) Performed At: [facility] [place], [place] [id number] Rush Farmer MD [id number]   Hemoglobin A1c     Status: Abnormal   Collection Time: 07/02/19  4:11 AM  Result Value Ref Range   Hgb A1c MFr Bld 10.1 (H) 4.8 - 5.6 %    Comment: (NOTE) Pre diabetes:           5.7%-6.4% Diabetes:              >6.4% Glycemic control for   <7.0% adults with diabetes    Mean Plasma Glucose 243.17 mg/dL    Comment: Performed at [facility] [address], [place], [place] [address]  Lipid panel     Status: Abnormal   Collection Time: 07/02/19  4:11 AM  Result Value Ref Range   Cholesterol 267 (H) 0 - 200 mg/dL   Triglycerides 182 (H) <150 mg/dL   HDL 32 (L) >40 mg/dL   Total CHOL/HDL Ratio 8.3 RATIO   VLDL 36 0 - 40 mg/dL   LDL Cholesterol 199 (H) 0 - 99 mg/dL    Comment:        Total Cholesterol/HDL:CHD Risk Coronary Heart Disease Risk Table                     Men   Women  1/2 Average Risk   3.4   3.3  Average Risk       5.0   4.4  2 X Average Risk   9.6   7.1  3 X Average Risk  23.4   11.0        Use the calculated Patient Ratio above and the CHD Risk Table to determine the patient's CHD Risk.        ATP III CLASSIFICATION (LDL):  <100     mg/dL   Optimal  100-129  mg/dL   Near or Above                    Optimal  130-159  mg/dL   Borderline  160-189  mg/dL   High  >190     mg/dL   Very High Performed at [facility] [address] [place] [address]   CBC     Status: Abnormal   Collection Time: 07/02/19  4:11 AM  Result Value Ref Range   WBC 11.1 (H) 4.0 - 10.5 K/uL   RBC 5.41 (H) 3.87 - 5.11 MIL/uL   Hemoglobin 16.2 (H) 12.0 - 15.0 g/dL   HCT  47.4 (H) 36.0 - 46.0 %   MCV 87.6 80.0 - 100.0 fL   MCH 29.9 26.0 - 34.0 pg   MCHC 34.2 30.0 - 36.0 g/dL   RDW 12.0 11.5 - 15.5 %   Platelets 329 150 - 400 K/uL   nRBC 0.0 0.0 - 0.2 %    Comment: Performed at [facility], [place] [address], [place], [place] [address]  CBG monitoring, ED     Status: Abnormal   Collection Time: 07/02/19  8:18 AM  Result Value Ref Range   Glucose-Capillary 284 (H) 70 - 99 mg/dL  Rapid urine drug screen (hospital performed)     Status: None   Collection Time: 07/02/19  8:28 AM  Result Value Ref Range   Opiates NONE DETECTED NONE  DETECTED   Cocaine NONE DETECTED NONE DETECTED   Benzodiazepines NONE DETECTED NONE DETECTED   Amphetamines NONE DETECTED NONE DETECTED   Tetrahydrocannabinol NONE DETECTED NONE DETECTED   Barbiturates NONE DETECTED NONE DETECTED    Comment: (NOTE) DRUG SCREEN FOR MEDICAL PURPOSES ONLY.  IF CONFIRMATION IS NEEDED FOR ANY PURPOSE, NOTIFY LAB WITHIN 5 DAYS. LOWEST DETECTABLE LIMITS FOR URINE DRUG SCREEN Drug Class                     Cutoff (ng/mL) Amphetamine and metabolites    1000 Barbiturate and metabolites    200 Benzodiazepine                 [id number] Tricyclics and metabolites     300 Opiates and metabolites        300 Cocaine and metabolites        300 THC                            50 Performed at [facility], [place] [address], [place], [place] [address]   ECHOCARDIOGRAM COMPLETE     Status: None   Collection Time: 07/02/19 12:15 PM  Result Value Ref Range   BP 143/67 mmHg  CBG monitoring, ED     Status: Abnormal   Collection Time: 07/02/19 12:38 PM  Result Value Ref Range   Glucose-Capillary 226 (H) 70 - 99 mg/dL   Comment 1 Notify RN    Comment 2 Document in Chart   Glucose, capillary     Status: Abnormal   Collection Time: 07/02/19  5:05 PM  Result Value Ref Range   Glucose-Capillary 266 (H) 70 - 99 mg/dL  Glucose, capillary     Status: Abnormal   Collection Time: 07/02/19  8:23 PM  Result Value Ref Range   Glucose-Capillary 227 (H) 70 - 99 mg/dL  Glucose, capillary     Status: Abnormal   Collection Time: 07/02/19 11:34 PM  Result Value Ref Range   Glucose-Capillary 179 (H) 70 - 99 mg/dL   Comment 1 Notify RN   CBC     Status: None   Collection Time: 07/03/19  3:18 AM  Result Value Ref Range   WBC 9.5 4.0 - 10.5 K/uL   RBC 4.90 3.87 - 5.11 MIL/uL   Hemoglobin 14.6 12.0 - 15.0 g/dL   HCT 43.0 36.0 - 46.0 %   MCV 87.8 80.0 - 100.0 fL   MCH 29.8 26.0 - 34.0 pg   MCHC 34.0 30.0 - 36.0 g/dL   RDW 12.2 11.5 - 15.5 %   Platelets 286 150 - 400 K/uL   nRBC  0.0 0.0 - 0.2 %    Comment: Performed at [facility], [place] [address], [place], [place] [id number]  Basic metabolic panel     Status: Abnormal   Collection Time: 07/03/19  3:18 AM  Result Value Ref Range   Sodium 138 135 - 145 mmol/L   Potassium 2.9 (L) 3.5 - 5.1 mmol/L   Chloride 100 98 - 111 mmol/L   CO2 28 22 - 32 mmol/L   Glucose, Bld 136 (H) 70 - 99 mg/dL   BUN 14 8 - 23 mg/dL   Creatinine, Ser 0.80 0.44 - 1.00 mg/dL   Calcium 8.6 (L) 8.9 - 10.3 mg/dL   GFR calc non Af Amer >60 >60 mL/min   GFR calc Af Amer >60 >60 mL/min   Anion gap 10 5 - 15    Comment: Performed at [facility], [place] [address], [place], [place] [address]  Glucose, capillary     Status: Abnormal   Collection Time: 07/03/19  6:54 AM  Result Value Ref Range   Glucose-Capillary 156 (H) 70 - 99 mg/dL   Comment 1 Notify RN   Glucose, capillary     Status: Abnormal   Collection Time: 07/03/19 12:33 PM  Result Value Ref Range   Glucose-Capillary 224 (H) 70 - 99 mg/dL  Glucose, capillary     Status: Abnormal   Collection Time: 07/03/19  5:10 PM  Result Value Ref Range   Glucose-Capillary 225 (H) 70 - 99 mg/dL  Glucose, capillary     Status: Abnormal   Collection Time: 07/03/19  8:55 PM  Result Value Ref Range   Glucose-Capillary 268 (H) 70 - 99 mg/dL  Glucose, capillary     Status: Abnormal   Collection Time: 07/04/19  6:18 AM  Result Value Ref Range   Glucose-Capillary 184 (H) 70 - 99 mg/dL   Comment 1 Notify RN   Basic metabolic panel     Status: Abnormal   Collection Time: 07/04/19  7:48 AM  Result Value Ref Range   Sodium 138 135 - 145 mmol/L   Potassium 4.3 3.5 - 5.1 mmol/L   Chloride 103 98 - 111 mmol/L   CO2 28 22 - 32 mmol/L   Glucose, Bld 223 (H) 70 - 99 mg/dL   BUN 13 8 - 23 mg/dL   Creatinine, Ser 0.79 0.44 - 1.00 mg/dL   Calcium 8.7 (L) 8.9 - 10.3 mg/dL   GFR calc non Af Amer >60 >60 mL/min   GFR calc Af Amer >60 >60 mL/min   Anion gap 7 5 - 15    Comment: Performed at [facility], [place] [address], [place], [place] [address]  Glucose, capillary     Status: Abnormal   Collection Time: 07/04/19 12:12 PM  Result Value Ref Range   Glucose-Capillary 279 (H) 70 - 99 mg/dL  Glucose, capillary     Status: Abnormal   Collection Time: 07/04/19  4:52 PM  Result Value Ref Range   Glucose-Capillary 200 (H) 70 - 99 mg/dL  Glucose, capillary     Status: Abnormal   Collection Time: 07/04/19  9:26 PM  Result Value Ref Range   Glucose-Capillary 228 (H) 70 - 99 mg/dL   Comment 1 Notify RN    Comment 2 Document in Chart   Glucose, capillary     Status: Abnormal   Collection Time: 07/05/19  6:10 AM  Result Value Ref Range   Glucose-Capillary 126 (H) 70 - 99 mg/dL  Comment 1 Notify RN    Comment 2 Document in Chart   Glucose, capillary     Status: Abnormal   Collection Time: 07/05/19 11:36 AM  Result Value Ref Range   Glucose-Capillary 217 (H) 70 - 99 mg/dL   Comment 1 Notify RN    Comment 2 Document in Chart   Glucose, capillary     Status: Abnormal   Collection Time: 07/05/19  4:18 PM  Result Value Ref Range   Glucose-Capillary 183 (H) 70 - 99 mg/dL  Glucose, capillary     Status: Abnormal   Collection Time: 07/05/19  9:40 PM  Result Value Ref Range   Glucose-Capillary 226 (H) 70 - 99 mg/dL  Basic metabolic panel     Status: Abnormal   Collection Time: 07/06/19  4:12 AM  Result Value Ref Range   Sodium 138 135 - 145 mmol/L   Potassium 3.3 (L) 3.5 - 5.1 mmol/L   Chloride 99 98 - 111 mmol/L   CO2 30 22 - 32 mmol/L   Glucose, Bld 138 (H) 70 - 99 mg/dL   BUN 10 8 - 23 mg/dL   Creatinine, Ser 0.81 0.44 - 1.00 mg/dL   Calcium 8.7 (L) 8.9 - 10.3 mg/dL   GFR calc non Af Amer >60 >60 mL/min   GFR calc Af Amer >60 >60 mL/min   Anion gap 9 5 - 15    Comment: Performed at [facility], [place] [address], [place], [place] [address]  CBC     Status: None   Collection Time: 07/06/19  4:12 AM  Result Value Ref Range   WBC 10.2 4.0 - 10.5 K/uL   RBC 4.41 3.87  - 5.11 MIL/uL   Hemoglobin 13.7 12.0 - 15.0 g/dL   HCT 38.4 36.0 - 46.0 %   MCV 87.1 80.0 - 100.0 fL   MCH 31.1 26.0 - 34.0 pg   MCHC 35.7 30.0 - 36.0 g/dL   RDW 12.1 11.5 - 15.5 %   Platelets 245 150 - 400 K/uL   nRBC 0.0 0.0 - 0.2 %    Comment: Performed at [facility], [place] [address] [place], [place] [address]  Glucose, capillary     Status: Abnormal   Collection Time: 07/06/19  6:07 AM  Result Value Ref Range   Glucose-Capillary 120 (H) 70 - 99 mg/dL  Glucose, capillary     Status: Abnormal   Collection Time: 07/06/19  9:43 AM  Result Value Ref Range   Glucose-Capillary 135 (H) 70 - 99 mg/dL  Glucose, capillary     Status: Abnormal   Collection Time: 07/06/19 12:19 PM  Result Value Ref Range   Glucose-Capillary 276 (H) 70 - 99 mg/dL   Comment 1 Notify RN    Comment 2 Document in Chart   Novel Coronavirus, NAA (Hosp order, Send-out to Ref Lab; TAT 18-24 hrs     Status: None   Collection Time: 07/06/19  2:45 PM   Specimen: Nasopharyngeal Swab; Respiratory  Result Value Ref Range   SARS-CoV-2, NAA NOT DETECTED NOT DETECTED    Comment: (NOTE) This nucleic acid amplification test was developed and its performance characteristics determined by [employer]. Nucleic acid amplification tests include PCR and TMA. This test has not been FDA cleared or approved. This test has been authorized by FDA under an Emergency Use Authorization (EUA). This test is only authorized for the duration of time the declaration that circumstances exist justifying the authorization of the emergency use  of in vitro diagnostic tests for detection of SARS-CoV-2 virus and/or diagnosis of COVID-19 infection under section 564(b)(1) of the Act, 21 U.S.C. [id number]) (1), unless the authorization is terminated or revoked sooner. When diagnostic testing is negative, the possibility of a false negative result should be considered in the context of a patient's recent exposures and the presence  of clinical signs and symptoms consistent with COVID-19. An individual without symptoms of COVID- 19 and who is not shedding SARS-CoV-2 vi rus would expect to have a negative (not detected) result in this assay. Performed At: [facility] [place], [place] [id number] Rush Farmer MD [id number]    Coronavirus Source NASOPHARYNGEAL     Comment: Performed at [facility], [place] [address] [place], [place] [address]  Glucose, capillary     Status: Abnormal   Collection Time: 07/06/19  3:49 PM  Result Value Ref Range   Glucose-Capillary 265 (H) 70 - 99 mg/dL  Glucose, capillary     Status: Abnormal   Collection Time: 07/06/19  9:38 PM  Result Value Ref Range   Glucose-Capillary 141 (H) 70 - 99 mg/dL  Basic metabolic panel     Status: Abnormal   Collection Time: 07/07/19  3:58 AM  Result Value Ref Range   Sodium 137 135 - 145 mmol/L   Potassium 3.3 (L) 3.5 - 5.1 mmol/L   Chloride 100 98 - 111 mmol/L   CO2 29 22 - 32 mmol/L   Glucose, Bld 161 (H) 70 - 99 mg/dL   BUN 11 8 - 23 mg/dL   Creatinine, Ser 0.76 0.44 - 1.00 mg/dL   Calcium 8.5 (L) 8.9 - 10.3 mg/dL   GFR calc non Af Amer >60 >60 mL/min   GFR calc Af Amer >60 >60 mL/min   Anion gap 8 5 - 15    Comment: Performed at [facility], [place] [address], [place], [place] [address]  Glucose, capillary     Status: Abnormal   Collection Time: 07/07/19  6:28 AM  Result Value Ref Range   Glucose-Capillary 126 (H) 70 - 99 mg/dL  Glucose, capillary     Status: Abnormal   Collection Time: 07/07/19 11:29 AM  Result Value Ref Range   Glucose-Capillary 205 (H) 70 - 99 mg/dL   Comment 1 Notify RN    Comment 2 Document in Chart   Glucose, capillary     Status: Abnormal   Collection Time: 07/07/19  3:47 PM  Result Value Ref Range   Glucose-Capillary 147 (H) 70 - 99 mg/dL   Comment 1 Notify RN    Comment 2 Document in Chart   Glucose, capillary     Status: Abnormal   Collection Time: 07/07/19  9:02 PM   Result Value Ref Range   Glucose-Capillary 207 (H) 70 - 99 mg/dL  Basic metabolic panel     Status: Abnormal   Collection Time: 07/08/19  3:33 AM  Result Value Ref Range   Sodium 137 135 - 145 mmol/L   Potassium 3.5 3.5 - 5.1 mmol/L   Chloride 99 98 - 111 mmol/L   CO2 28 22 - 32 mmol/L   Glucose, Bld 131 (H) 70 - 99 mg/dL   BUN 10 8 - 23 mg/dL   Creatinine, Ser 0.77 0.44 - 1.00 mg/dL   Calcium 8.7 (L) 8.9 - 10.3 mg/dL   GFR calc non Af Amer >60 >60 mL/min   GFR calc Af Amer >60 >60 mL/min   Anion gap  10 5 - 15    Comment: Performed at [facility], [place] [address], [place], [place] [address]  Glucose, capillary     Status: Abnormal   Collection Time: 07/08/19  6:36 AM  Result Value Ref Range   Glucose-Capillary 120 (H) 70 - 99 mg/dL  Glucose, capillary     Status: Abnormal   Collection Time: 07/08/19 11:39 AM  Result Value Ref Range   Glucose-Capillary 219 (H) 70 - 99 mg/dL  Glucose, capillary     Status: Abnormal   Collection Time: 07/08/19  4:15 PM  Result Value Ref Range   Glucose-Capillary 155 (H) 70 - 99 mg/dL      PHQ2/9: Depression screen [facility] 2/9 07/24/2019 01/28/2019 03/26/2018 06/12/2017 12/04/2016  Decreased Interest 0 0 0 0 0  Down, Depressed, Hopeless 0 0 0 0 0  PHQ - 2 Score 0 0 0 0 0  Altered sleeping 0 0 0 - -  Tired, decreased energy 0 0 1 - -  Change in appetite 0 0 0 - -  Feeling bad or failure about yourself  0 0 0 - -  Trouble concentrating 0 0 0 - -  Moving slowly or fidgety/restless 0 0 0 - -  Suicidal thoughts 0 0 0 - -  PHQ-9 Score 0 0 1 - -  Difficult doing work/chores - Not difficult at all Not difficult at all - -    phq 9 is negative   Fall Risk: Fall Risk  07/24/2019 02/12/2019 01/28/2019 10/28/2018 10/22/2018  Falls in the past year? 0 0 0 0 0  Number falls in past yr: 0 - 0 0 -  Injury with Fall? 0 - 0 0 -    Functional Status Survey: Is the patient deaf or have difficulty hearing?: No Does the patient have difficulty seeing,  even when wearing glasses/contacts?: Yes Does the patient have difficulty concentrating, remembering, or making decisions?: No Does the patient have difficulty walking or climbing stairs?: No Does the patient have difficulty dressing or bathing?: No Does the patient have difficulty doing errands alone such as visiting a doctor's office or shopping?: Yes    Assessment & Plan  1. Dyslipidemia associated with type 2 diabetes mellitus (HCC)  - Semaglutide, 1 MG/DOSE, (OZEMPIC, 1 MG/DOSE,) 2 MG/1.5ML SOPN; Inject 1 mg into the skin every 7 (seven) days.  Dispense: 9 mL; Refill: 2 - Insulin Detemir (LEVEMIR FLEXTOUCH) 100 UNIT/ML Pen; Inject 25-50 Units into the skin daily.  Dispense: 15 mL; Refill: 2 - metFORMIN (GLUCOPHAGE-XR) 750 MG 24 hr tablet; Take 1 tablet (750 mg total) by mouth 2 (two) times daily.  Dispense: 180 tablet; Refill: 1  2. Hypertension, uncontrolled  - Ambulatory referral to Cardiology - hydrALAZINE (APRESOLINE) 10 MG tablet; Take 1 tablet (10 mg total) by mouth 3 (three) times daily as needed. If bp goes above 150/90  Dispense: 30 tablet; Refill: 0 - amLODipine (NORVASC) 5 MG tablet; Take 1 tablet (5 mg total) by mouth daily.  Dispense: 90 tablet; Refill: 1 - olmesartan-hydrochlorothiazide (BENICAR HCT) 40-25 MG tablet; Take 1 tablet by mouth daily.  Dispense: 90 tablet; Refill: 0 - COMPLETE METABOLIC PANEL WITH GFR  3. Dyslipidemia   4. OSA (obstructive sleep apnea)  - Ambulatory referral to Sleep Studies  5. Carotid stenosis, bilateral  - Ambulatory referral to Vascular Surgery  6. History of CVA with residual deficit  - Ambulatory referral to Cardiology - atorvastatin (LIPITOR) 80 MG tablet; Take 1 tablet (80 mg total) by  mouth daily at 6 PM.  Dispense: 90 tablet; Refill: 0 - clopidogrel (PLAVIX) 75 MG tablet; Take 1 tablet (75 mg total) by mouth daily.  Dispense: 90 tablet; Refill: 1

## 2019-07-25 LAB — COMPLETE METABOLIC PANEL WITH GFR
AG Ratio: 1.2 (calc) (ref 1.0–2.5)
ALT: 14 U/L (ref 6–29)
AST: 14 U/L (ref 10–35)
Albumin: 3.7 g/dL (ref 3.6–5.1)
Alkaline phosphatase (APISO): 114 U/L (ref 37–153)
BUN: 15 mg/dL (ref 7–25)
CO2: 28 mmol/L (ref 20–32)
Calcium: 9.3 mg/dL (ref 8.6–10.4)
Chloride: 100 mmol/L (ref 98–110)
Creat: 0.85 mg/dL (ref 0.50–0.99)
GFR, Est African American: 83 mL/min/{1.73_m2} (ref >=60)
GFR, Est Non African American: 71 mL/min/{1.73_m2} (ref >=60)
Globulin: 3.1 g/dL (calc) (ref 1.9–3.7)
Glucose, Bld: 177 mg/dL — ABNORMAL HIGH (ref 65–99)
Potassium: 3.5 mmol/L (ref 3.5–5.3)
Sodium: 140 mmol/L (ref 135–146)
Total Bilirubin: 0.4 mg/dL (ref 0.2–1.2)
Total Protein: 6.8 g/dL (ref 6.1–8.1)

## 2019-07-27 ENCOUNTER — Telehealth: Payer: Self-pay

## 2019-07-27 NOTE — Telephone Encounter (Signed)
Called patient left a message and asked her to call back and schedule new patient consult for apnea with Dr Devona Konig per her pcp request.Beth [phone]

## 2019-07-28 ENCOUNTER — Encounter: Payer: Self-pay | Admitting: Family Medicine

## 2019-07-29 ENCOUNTER — Other Ambulatory Visit: Payer: Self-pay | Admitting: Family Medicine

## 2019-07-29 DIAGNOSIS — E1169 Type 2 diabetes mellitus with other specified complication: Secondary | ICD-10-CM

## 2019-07-29 MED ORDER — LEVEMIR FLEXTOUCH 100 UNIT/ML ~~LOC~~ SOPN: 25.0000 [IU] | PEN_INJECTOR | Freq: Every day | SUBCUTANEOUS | 2 refills | Status: DC

## 2019-07-29 NOTE — Telephone Encounter (Signed)
Medication Refill - Medication:  Insulin Detemir (LEVEMIR FLEXTOUCH)  0.37mmx 0.45mm needles Meter strips  Lancets  Has the patient contacted their pharmacy? Yes advised to call office. Patient is completely out  Preferred Pharmacy (with phone number or street name):     [facility] [address] [place], [place] [address]  Agent: Please be advised that RX refills may take up to 3 business days. We ask that you follow-up with your pharmacy.

## 2019-07-30 ENCOUNTER — Other Ambulatory Visit: Payer: Self-pay

## 2019-07-30 MED ORDER — INSULIN PEN NEEDLE 32G X 4 MM MISC: 1.0000 | Freq: Every day | 11 refills | Status: DC

## 2019-07-31 ENCOUNTER — Telehealth: Payer: Self-pay

## 2019-07-31 ENCOUNTER — Other Ambulatory Visit: Payer: Self-pay

## 2019-07-31 ENCOUNTER — Ambulatory Visit (INDEPENDENT_AMBULATORY_CARE_PROVIDER_SITE_OTHER): Payer: BC Managed Care – PPO | Admitting: Cardiology

## 2019-07-31 ENCOUNTER — Encounter: Payer: Self-pay | Admitting: Cardiology

## 2019-07-31 ENCOUNTER — Other Ambulatory Visit: Payer: Self-pay | Admitting: Family Medicine

## 2019-07-31 VITALS — BP 122/70 | HR 89 | Ht 64.5 in | Wt 205.0 lb

## 2019-07-31 DIAGNOSIS — I6523 Occlusion and stenosis of bilateral carotid arteries: Secondary | ICD-10-CM

## 2019-07-31 DIAGNOSIS — I6930 Unspecified sequelae of cerebral infarction: Secondary | ICD-10-CM

## 2019-07-31 DIAGNOSIS — I10 Essential (primary) hypertension: Secondary | ICD-10-CM

## 2019-07-31 DIAGNOSIS — E1169 Type 2 diabetes mellitus with other specified complication: Secondary | ICD-10-CM

## 2019-07-31 DIAGNOSIS — E785 Hyperlipidemia, unspecified: Secondary | ICD-10-CM

## 2019-07-31 MED ORDER — CLOPIDOGREL BISULFATE 75 MG PO TABS: 75.0000 mg | ORAL_TABLET | Freq: Every day | ORAL | 1 refills | Status: DC

## 2019-07-31 MED ORDER — BLOOD GLUCOSE METER KIT: PACK | 0 refills | Status: DC

## 2019-07-31 MED ORDER — AMLODIPINE BESYLATE 5 MG PO TABS: 5.0000 mg | ORAL_TABLET | Freq: Every day | ORAL | 1 refills | Status: DC

## 2019-07-31 MED ORDER — ATORVASTATIN CALCIUM 80 MG PO TABS: 80.0000 mg | ORAL_TABLET | Freq: Every day | ORAL | 1 refills | Status: DC

## 2019-07-31 NOTE — Telephone Encounter (Signed)
Copied from [place] [id number]. Topic: General - Other >> Jul 30, 2019 11:25 AM Pauline Good wrote: Reason for CRM: pt is needing a new meter, lancets and strips [facility]

## 2019-07-31 NOTE — Patient Instructions (Signed)
Medication Instructions:  Your physician recommends that you continue on your current medications as directed. Please refer to the Current Medication list given to you today.  *If you need a refill on your cardiac medications before your next appointment, please call your pharmacy*  Lab Work: None ordered If you have labs (blood work) drawn today and your tests are completely normal, you will receive your results only by: Marland Kitchen MyChart Message (if you have MyChart) OR . A paper copy in the mail If you have any lab test that is abnormal or we need to change your treatment, we will call you to review the results.  Testing/Procedures: None ordered  Follow-Up: At [facility], you and your health needs are our priority.  As part of our continuing mission to provide you with exceptional heart care, we have created designated Provider Care Teams.  These Care Teams include your primary Cardiologist (physician) and Advanced Practice Providers (APPs -  Physician Assistants and Nurse Practitioners) who all work together to provide you with the care you need, when you need it.  Your next appointment:   12 months  The format for your next appointment:   In Person  Provider:    You may see Kate Sable, MD or one of the following Advanced Practice Providers on your designated Care Team:    Murray Hodgkins, NP  Christell Faith, PA-C  Marrianne Mood, PA-C   Other Instructions N/A

## 2019-07-31 NOTE — Progress Notes (Signed)
Cardiology Office Note:    Date:  07/31/2019   ID:  Meagan [name], DOB 1951-07-05, MRN [id number]  PCP:  Steele Sizer, MD  Cardiologist:  Kate Sable, MD  Electrophysiologist:  None   Referring MD: Steele Sizer, MD   Chief Complaint  Patient presents with  . New Patient (Initial Visit)     per Jerrell Belfast for hypertension and  History of CVA with residual deficit. meds reviewed verbally with patient.     History of Present Illness:    Meagan [name] is a 68 y.o. female with a hx of hypertension, diabetes, CVA who presents as a referral due to hypertension and recent CVA.  About 5-6 weeks ago, patient presented to her eye doctor and was given an injection to decrease pressures.  3 days later, patient states having severe headaches with difficulty finding any comfortable position.  She tried taking Advil but symptoms persisted.  She returned to see her eye doctor and blood pressure was noted to be elevated around 170s.  She also complained of loss of peripheral vision on her left.  She did not have any arm, leg or facial weakness.  Patient was advised to go to the emergency room.  In the emergency room, systolic pressures were in the 150s to 180s.  Imaging with brain MRI showed an acute infarct in the right parietal occipital junction measuring about 5 x 2 x 3 cm.  Neck CT angiogram showed carotid artery disease but not severe.  She was placed on Plavix, BP meds, Lipitor, and discharged to rehab about a week later.  She states not taking her blood pressure medications prior to ED emergency room visit as prescribed.  Patient denies palpitations.  Past Medical History:  Diagnosis Date  . Cellulitis    left leg  . 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 Diffuse cystic mastopathy   . Fall 2013  . Hypertension 2004  . Occlusion and stenosis of carotid artery without mention of cerebral infarction   . Sleep apnea     Past Surgical History:   Procedure Laterality Date  . APPENDECTOMY  2000s  . BIOPSY THYROID  2006   [facility]  . BREAST EXCISIONAL BIOPSY Left 2000s   neg  . CARPAL TUNNEL RELEASE Left March 2015  . COLONOSCOPY  2011   DR.WOHL  . COLONOSCOPY WITH PROPOFOL N/A 07/11/2017   Procedure: COLONOSCOPY WITH PROPOFOL;  Surgeon: Jonathon Bellows, MD;  Location: [facility] ENDOSCOPY;  Service: Gastroenterology;  Laterality: N/A;  . ganglion cyst removal     . KNEE SURGERY Left 08/2009  . KNEE SURGERY Right 04/07/2010  . ROTATOR CUFF REPAIR Right 2008  . TUBAL LIGATION      Current Medications: Current Meds  Medication Sig  . amLODipine (NORVASC) 5 MG tablet Take 1 tablet (5 mg total) by mouth daily.  Marland Kitchen aspirin 325 MG tablet Take 1 tablet (325 mg total) by mouth daily. Take for 3 months only.  Marland Kitchen atorvastatin (LIPITOR) 80 MG tablet Take 1 tablet (80 mg total) by mouth daily at 6 PM.  . clopidogrel (PLAVIX) 75 MG tablet Take 1 tablet (75 mg total) by mouth daily.  . hydrALAZINE (APRESOLINE) 10 MG tablet Take 1 tablet (10 mg total) by mouth 3 (three) times daily as needed. If bp goes above 150/90  . Insulin Detemir (LEVEMIR FLEXTOUCH) 100 UNIT/ML Pen Inject 25-50 Units into the skin daily.  . Insulin Pen Needle 32G X 4 MM MISC 1  each by Does not apply route daily.  Marland Kitchen levocetirizine (XYZAL) 5 MG tablet Take 5 mg by mouth as needed for allergies.  . metFORMIN (GLUCOPHAGE-XR) 750 MG 24 hr tablet Take 1 tablet (750 mg total) by mouth 2 (two) times daily.  Marland Kitchen olmesartan-hydrochlorothiazide (BENICAR HCT) 40-25 MG tablet Take 1 tablet by mouth daily.  . polyethylene glycol (MIRALAX / GLYCOLAX) 17 g packet Take 17 g by mouth daily.  . Semaglutide, 1 MG/DOSE, (OZEMPIC, 1 MG/DOSE,) 2 MG/1.5ML SOPN Inject 1 mg into the skin every 7 (seven) days.  . traMADol (ULTRAM) 50 MG tablet Take 1 tablet (50 mg total) by mouth every 6 (six) hours as needed for moderate pain.     Allergies:   Farxiga [dapagliflozin] and Keflex [cephalexin]   Social History    Socioeconomic History  . Marital status: Widowed    Spouse name: Not on file  . Number of children: 4  . Years of education: Not on file  . Highest education level: Master's degree (e.g., MA, MS, MEng, MEd, MSW, MBA)  Occupational History  . Occupation: Higher education careers adviser   Social Needs  . Financial resource strain: Not hard at all  . Food insecurity    Worry: Never true    Inability: Never true  . Transportation needs    Medical: No    Non-medical: No  Tobacco Use  . Smoking status: Never Smoker  . Smokeless tobacco: Never Used  Substance and Sexual Activity  . Alcohol use: No  . Drug use: No  . Sexual activity: Not Currently    Partners: Male    Comment: Widow for 14 years  Lifestyle  . Physical activity    Days per week: 0 days    Minutes per session: 0 min  . Stress: Not at all  Relationships  . Social connections    Talks on phone: More than three times a week    Gets together: More than three times a week    Attends religious service: More than 4 times per year    Active member of club or organization: Yes    Attends meetings of clubs or organizations: More than 4 times per year    Relationship status: Widowed  Other Topics Concern  . Not on file  Social History Narrative   She had a stroke on 06/2019, she has left peripheral vision loss since stroke   She moved in with her youngest sister      Family History: The patient's family history includes Breast cancer in her maternal aunt; Breast cancer (age of onset: 71) in her sister; Congestive Heart Failure in her father; Diabetes in her sister; Hypertension in her father, mother, paternal grandfather, sister, and sister; Kidney Stones in her mother; Varicose Veins in her sister; Vision loss in her sister.  ROS:   Please see the history of present illness.     All other systems reviewed and are negative.  EKGs/Labs/Other Studies Reviewed:    The following studies were reviewed  today: 07/02/2019 IMPRESSIONS    1. Left ventricular ejection fraction, by visual estimation, is 60 to 65%. The left ventricle has normal function. Normal left ventricular size. There is mildly increased left ventricular hypertrophy.  2. Left ventricular diastolic Doppler parameters are consistent with impaired relaxation pattern of LV diastolic filling.  3. Trivial pericardial effusion is present.  4. The aortic valve is tricuspid Aortic valve regurgitation was not visualized by color flow Doppler. Structurally normal aortic valve, with no evidence of  sclerosis or stenosis.  5. The tricuspid valve is normal in structure. Tricuspid valve regurgitation is trivial.  6. Global right ventricle has normal systolic function.The right ventricular size is normal. No increase in right ventricular wall thickness.  7. Mild mitral annular calcification.  8. The mitral valve is normal in structure. No evidence of mitral valve regurgitation. No evidence of mitral stenosis.  9. Right atrial size was normal. 10. Left atrial size was normal. 11. The tricuspid regurgitant velocity is 2.30 m/s, and with an assumed right atrial pressure of 3 mmHg, the estimated right ventricular systolic pressure is normal at 24.2 mmHg. 12. The inferior vena cava is normal in size with greater than 50% respiratory variability, suggesting right atrial pressure of 3 mmHg.  CTA neck 07/02/2019  IMPRESSION: 1. 45% diameter stenosis proximal right internal carotid artery. 60% diameter stenosis right external carotid artery 2. 30% diameter stenosis distal left common carotid artery. Left internal carotid artery mildly disease without significant stenosis 3. Both vertebral arteries are patent to the basilar without Stenosis.  MRI HEAD 07/01/2019  Brain: Diffusion imaging shows acute infarction at the parieto-occipital junction region on the right affecting an area measuring approximately 5 x 2 x 3 cm. Mild swelling but no  visible hemorrhage. No other acute finding. There chronic small-vessel ischemic changes of the pons. No focal cerebellar insult. Cerebral hemispheres show moderate chronic small-vessel ischemic changes throughout the deep and subcortical hemispheric white matter. No mass lesion, hydrocephalus or extra-axial collection.  EKG:  EKG is  ordered today.  The ekg ordered today demonstrates normal sinus rhythm, QTC 474.  Recent Labs: 07/06/2019: Hemoglobin 13.7; Platelets 245 07/24/2019: ALT 14; BUN 15; Creat 0.85; Potassium 3.5; Sodium 140  Recent Lipid Panel    Component Value Date/Time   CHOL 267 (H) 07/02/2019 0411   CHOL 200 (H) 01/31/2016 1115   TRIG 182 (H) 07/02/2019 0411   HDL 32 (L) 07/02/2019 0411   HDL 34 (L) 01/31/2016 1115   CHOLHDL 8.3 07/02/2019 0411   VLDL 36 07/02/2019 0411   LDLCALC 199 (H) 07/02/2019 0411   LDLCALC 132 (H) 03/26/2018 1543    Physical Exam:    VS:  BP 122/70 (BP Location: Left Arm, Patient Position: Sitting, Cuff Size: Normal)   Pulse 89   Ht 5' 4.5" (1.638 m)   Wt 205 lb (93 kg)   BMI 34.64 kg/m     Wt Readings from Last 3 Encounters:  07/31/19 205 lb (93 kg)  07/24/19 205 lb 4.8 oz (93.1 kg)  07/05/19 211 lb 13.8 oz (96.1 kg)     GEN:  Well nourished, well developed in no acute distress, dark glasses on. HEENT: Normal NECK: No JVD; No carotid bruits LYMPHATICS: No lymphadenopathy CARDIAC: RRR, no murmurs, rubs, gallops RESPIRATORY:  Clear to auscultation without rales, wheezing or rhonchi  ABDOMEN: Soft, non-tender, non-distended MUSCULOSKELETAL:  No edema; No deformity  SKIN: Warm and dry NEUROLOGIC:  Alert and oriented x 3 PSYCHIATRIC:  Normal affect   ASSESSMENT:   Patient with history of stroke.  No evidence of arrhythmias while in the hospital for over 1 week.  Echocardiogram was normal.  Etiology of her stroke is likely hypertension and possibly plaque from her carotid artery.  Her carotid artery stenosis from CT angiogram was  not severe.  There is no indication from the hospital records of patient history which points to atrial arrhythmia as contributing to stroke.  Blood pressure is adequately controlled on current blood pressure meds.  1. Hypertension, benign   2. History of CVA with residual deficit   3. Bilateral carotid artery stenosis    PLAN:    Patient counseled for over 30 minutes on medication compliance.  Continue current blood pressure medications.  Continue Plavix and Lipitor as currently prescribed.  She is scheduled to see vascular surgery in light of her carotid artery disease.  Her echocardiogram is normal.  No further testing indicated from a cardiac perspective at this point.  Follow-up in about a year.  Total encounter time more than 45 minutes  Greater than 50% was spent in counseling and coordination of care with the patient  This note was generated in part or whole with voice recognition software. Voice recognition is usually quite accurate but there are transcription errors that can and very often do occur. I apologize for any typographical errors that were not detected and corrected.   Medication Adjustments/Labs and Tests Ordered: Current medicines are reviewed at length with the patient today.  Concerns regarding medicines are outlined above.  Orders Placed This Encounter  Procedures  . EKG 12-Lead   No orders of the defined types were placed in this encounter.   Patient Instructions  Medication Instructions:  Your physician recommends that you continue on your current medications as directed. Please refer to the Current Medication list given to you today.  *If you need a refill on your cardiac medications before your next appointment, please call your pharmacy*  Lab Work: None ordered If you have labs (blood work) drawn today and your tests are completely normal, you will receive your results only by: Marland Kitchen MyChart Message (if you have MyChart) OR . A paper copy in the  mail If you have any lab test that is abnormal or we need to change your treatment, we will call you to review the results.  Testing/Procedures: None ordered  Follow-Up: At [facility], you and your health needs are our priority.  As part of our continuing mission to provide you with exceptional heart care, we have created designated Provider Care Teams.  These Care Teams include your primary Cardiologist (physician) and Advanced Practice Providers (APPs -  Physician Assistants and Nurse Practitioners) who all work together to provide you with the care you need, when you need it.  Your next appointment:   12 months  The format for your next appointment:   In Person  Provider:    You may see Kate Sable, MD or one of the following Advanced Practice Providers on your designated Care Team:    Murray Hodgkins, NP  Christell Faith, PA-C  Marrianne Mood, PA-C   Other Instructions N/A     Signed, Kate Sable, MD  07/31/2019 3:23 PM    [place]

## 2019-08-03 ENCOUNTER — Telehealth: Payer: Self-pay | Admitting: Family Medicine

## 2019-08-03 NOTE — Telephone Encounter (Signed)
Pt's sister [name] called in to be advised. They would like to double check with provider to see if she still need to wear hear monitor, they received the update from cardiologist.    CB: [phone]- Nellie - (sister) - please advise directly.

## 2019-08-03 NOTE — Telephone Encounter (Signed)
Left a message asking what the question was so I can forward it to Dr. Ancil Boozer.

## 2019-08-03 NOTE — Telephone Encounter (Signed)
She went to see Dr. Aaron Edelman Friday and he says that she does not have congestive heart failure, nothing should be done, and she is currently fine. Nellie wants to know if pt needs to use heart monitor.  Dr. Aaron Edelman Cardiologist says to return to him in a year.  Please advise

## 2019-08-06 ENCOUNTER — Telehealth: Payer: Self-pay | Admitting: Cardiology

## 2019-08-06 ENCOUNTER — Encounter: Payer: Self-pay | Admitting: Adult Health

## 2019-08-06 ENCOUNTER — Ambulatory Visit: Payer: BC Managed Care – PPO | Admitting: Adult Health

## 2019-08-06 ENCOUNTER — Other Ambulatory Visit: Payer: Self-pay

## 2019-08-06 VITALS — BP 136/89 | HR 92 | Temp 97.4°F | Ht 64.5 in | Wt 196.8 lb

## 2019-08-06 DIAGNOSIS — I63531 Cerebral infarction due to unspecified occlusion or stenosis of right posterior cerebral artery: Secondary | ICD-10-CM | POA: Diagnosis not present

## 2019-08-06 DIAGNOSIS — I10 Essential (primary) hypertension: Secondary | ICD-10-CM

## 2019-08-06 DIAGNOSIS — E1129 Type 2 diabetes mellitus with other diabetic kidney complication: Secondary | ICD-10-CM | POA: Diagnosis not present

## 2019-08-06 DIAGNOSIS — IMO0002 Reserved for concepts with insufficient information to code with codable children: Secondary | ICD-10-CM

## 2019-08-06 DIAGNOSIS — R809 Proteinuria, unspecified: Secondary | ICD-10-CM

## 2019-08-06 DIAGNOSIS — E785 Hyperlipidemia, unspecified: Secondary | ICD-10-CM

## 2019-08-06 DIAGNOSIS — R471 Dysarthria and anarthria: Secondary | ICD-10-CM

## 2019-08-06 DIAGNOSIS — E1165 Type 2 diabetes mellitus with hyperglycemia: Secondary | ICD-10-CM

## 2019-08-06 DIAGNOSIS — H53462 Homonymous bilateral field defects, left side: Secondary | ICD-10-CM

## 2019-08-06 NOTE — Progress Notes (Signed)
I agree with the above plan 

## 2019-08-06 NOTE — Progress Notes (Signed)
[facility] [address] [place] [address] [phone]       HOSPITAL FOLLOW UP NOTE  [name] Date of Birth:  June 09, 1951 Medical Record Number:  [id number]   Reason for Referral:  hospital stroke follow up    CHIEF COMPLAINT:  Chief Complaint  Patient presents with   Hospitalization Follow-up    Sister present. Rm 9. No new concerns at this time.     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  History obtained from patient, sister and chart review. Reviewed all radiology images and labs personally.  [name] is a 68 y.o. female with history of NIDDM, HTN, OSA who had recent eye injections  presented on 07/01/2019 with HA, difficulty looking to the L that did not resolve and L sided numbness since 9/20.  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  MRI showed acute right parieto-occipital junction infarct along with small vessel disease.  MRA head showed arthrosclerosis bilateral PCAs with focal stenosis right P1/P2 junction.  MRA neck motion degraded.  CTA head neck right ICA 45% stenosis, right ECA 60%, left CCA 30% stenosis.  2D echo unremarkable.  Recommended 30-day cardiac event monitor outpatient to rule out atrial fibrillation. LDL 109.  A1c 10.1.  Initiated DAPT for 3 months due to intracranial stenosis and Plavix alone as previously on aspirin.  Uncontrolled DM and recommend close PCP follow-up.  HTN stable.  Change of statin therapy to atorvastatin 80 mg daily.  Other stroke risk factors include advanced age, obesity and OSA but no prior history of stroke.  Residual deficits of left homonymous hemianopia and recommended follow-up with ophthalmology prior to return to driving.  She also had residual right hemiparesis and discharged to SNF on 07/08/2019 for  ongoing therapy.  [name] is a 68 year old female who is being seen today for stroke follow-up accompanied by her sister.  She has since been discharged from SNF rehab and currently residing with her sister.  Residual deficits of left homonymous hemianopia, left hemiparesis and delayed recall.  She continues to participate in outpatient PT/OT in [place], [place] with ongoing improvement.  Continues to ambulate with rolling walker.  She denies improvement of her vision.  She has had evaluation by her retina specialist since hospital discharge and has follow-up visit in December.  She does report worsening of vision and headaches with eyestrain.  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  She has not returned to driving due to decreased vision.  She continues on aspirin and Plavix without bleeding or bruising.  Continues on atorvastatin without myalgias.  Blood pressure today 136/89.  She did receive 30-day cardiac event but per patient, has attempted to reach out to her cardiologist in regards to need of monitoring and has not received return phone call.  No further concerns at this time.    ROS:   14 system review of systems performed and negative with exception of weakness, decreased vision, speech difficulty  PMH:  Past Medical History:  Diagnosis Date   Cellulitis    left leg   Diabetes mellitus without complication ([place])    non-insulin dependent   Diffuse cystic mastopathy    Fall 2013   Hypertension 2004   Occlusion and stenosis of carotid artery  without mention of cerebral infarction    Sleep apnea     PSH:  Past Surgical History:  Procedure Laterality Date   APPENDECTOMY  2000s   BIOPSY THYROID  2006   [facility]   BREAST EXCISIONAL BIOPSY Left 2000s   neg   CARPAL TUNNEL RELEASE Left March 2015   COLONOSCOPY  2011   DR[facility]   COLONOSCOPY WITH PROPOFOL N/A 07/11/2017   Procedure: COLONOSCOPY WITH PROPOFOL;  Surgeon:  Jonathon Bellows, MD;  Location: [facility] ENDOSCOPY;  Service: Gastroenterology;  Laterality: N/A;   ganglion cyst removal      KNEE SURGERY Left 08/2009   KNEE SURGERY Right 04/07/2010   ROTATOR CUFF REPAIR Right 2008   TUBAL LIGATION      Social History:  Social History   Socioeconomic History   Marital status: Widowed    Spouse name: Not on file   Number of children: 4   Years of education: Not on file   Highest education level: Master's degree (e.g., MA, MS, MEng, MEd, MSW, MBA)  Occupational History   Occupation: Administrator strain: Not hard at all   Food insecurity    Worry: Never true    Inability: Never true   Transportation needs    Medical: No    Non-medical: No  Tobacco Use   Smoking status: Never Smoker   Smokeless tobacco: Never Used  Substance and Sexual Activity   Alcohol use: No   Drug use: No   Sexual activity: Not Currently    Partners: Male    Comment: Widow for 14 years  Lifestyle   Physical activity    Days per week: 0 days    Minutes per session: 0 min   Stress: Not at all  Relationships   Social connections    Talks on phone: More than three times a week    Gets together: More than three times a week    Attends religious service: More than 4 times per year    Active member of club or organization: Yes    Attends meetings of clubs or organizations: More than 4 times per year    Relationship status: Widowed   Intimate partner violence    Fear of current or ex partner: No    Emotionally abused: No    Physically abused: No    Forced sexual activity: No  Other Topics Concern   Not on file  Social History Narrative   She had a stroke on 06/2019, she has left peripheral vision loss since stroke   She moved in with her youngest sister     Family History:  Family History  Problem Relation Age of Onset   Breast cancer Maternal Aunt    Hypertension Mother    Kidney Stones Mother     Hypertension Father    Congestive Heart Failure Father    Hypertension Paternal Grandfather    Diabetes Sister    Vision loss Sister    Varicose Veins Sister    Hypertension Sister    Breast cancer Sister 33       Double Mastectomy   Hypertension Sister     Medications:   Current Outpatient Medications on File Prior to Visit  Medication Sig Dispense Refill   amLODipine (NORVASC) 5 MG tablet Take 1 tablet (5 mg total) by mouth daily. 90 tablet 1   aspirin 325 MG tablet Take 1 tablet (325 mg total) by mouth daily. Take for 3 months  only.     atorvastatin (LIPITOR) 80 MG tablet Take 1 tablet (80 mg total) by mouth daily at 6 PM. 90 tablet 1   blood glucose meter kit and supplies Dispense based on patient and insurance preference. Use up to four times daily as directed. (FOR ICD-10 E10.9, E11.9). 1 each 0   clopidogrel (PLAVIX) 75 MG tablet Take 1 tablet (75 mg total) by mouth daily. 90 tablet 1   hydrALAZINE (APRESOLINE) 10 MG tablet Take 1 tablet (10 mg total) by mouth 3 (three) times daily as needed. If bp goes above 150/90 30 tablet 0   Insulin Detemir (LEVEMIR FLEXTOUCH) 100 UNIT/ML Pen Inject 25-50 Units into the skin daily. 15 mL 2   Insulin Pen Needle 32G X 4 MM MISC 1 each by Does not apply route daily. 100 each 11   levocetirizine (XYZAL) 5 MG tablet Take 5 mg by mouth as needed for allergies.     metFORMIN (GLUCOPHAGE-XR) 750 MG 24 hr tablet Take 1 tablet (750 mg total) by mouth 2 (two) times daily. 180 tablet 1   olmesartan-hydrochlorothiazide (BENICAR HCT) 40-25 MG tablet Take 1 tablet by mouth daily. 90 tablet 0   polyethylene glycol (MIRALAX / GLYCOLAX) 17 g packet Take 17 g by mouth daily. 14 each 0   Semaglutide, 1 MG/DOSE, (OZEMPIC, 1 MG/DOSE,) 2 MG/1.5ML SOPN Inject 1 mg into the skin every 7 (seven) days. 9 mL 2   traMADol (ULTRAM) 50 MG tablet Take 1 tablet (50 mg total) by mouth every 6 (six) hours as needed for moderate pain. 10 tablet 0   No  current facility-administered medications on file prior to visit.     Allergies:   Allergies  Allergen Reactions   Farxiga [Dapagliflozin] Rash    Pt reports that she developed a rash when she took [place] and she would like it to be added as an allergy   Keflex [Cephalexin] Rash     Physical Exam  Vitals:   08/06/19 0920  BP: 136/89  Pulse: 92  Temp: (!) 97.4 F (36.3 C)  TempSrc: Oral  Weight: 196 lb 12.8 oz (89.3 kg)  Height: 5' 4.5" (1.638 m)   Body mass index is 33.26 kg/m. No exam data present  Depression screen [facility] 2/9 07/24/2019  Decreased Interest 0  Down, Depressed, Hopeless 0  PHQ - 2 Score 0  Altered sleeping 0  Tired, decreased energy 0  Change in appetite 0  Feeling bad or failure about yourself  0  Trouble concentrating 0  Moving slowly or fidgety/restless 0  Suicidal thoughts 0  PHQ-9 Score 0  Difficult doing work/chores -     General: well developed, well nourished, pleasant middle-aged African-American female, seated, in no evident distress Head: head normocephalic and atraumatic.   Neck: supple with no carotid or supraclavicular bruits Cardiovascular: regular rate and rhythm, no murmurs Musculoskeletal: no deformity Skin:  no rash/petichiae Vascular:  Normal pulses all extremities   Neurologic Exam Mental Status: Awake and fully alert.   Mild dysarthria with speech hesitancy and slowed movements/thought process.  Oriented to place and time. Recent and remote memory intact. Attention span, concentration and fund of knowledge appropriate but delayed. Mood and affect appropriate.  Cranial Nerves: Fundoscopic exam reveals sharp disc margins. Pupils equal, briskly reactive to light. Extraocular movements full without nystagmus. Visual fields left homonymous hemianopia.  Hearing intact. Facial sensation intact.  Mild left lower facial weakness. Motor: Normal bulk and tone. Normal strength in all tested extremity muscles except  for mild left hip  flexor weakness.  Sensory.: intact to touch , pinprick , position and vibratory sensation.  Coordination: Rapid alternating movements normal in all extremities except slightly decreased left hand dexterity. Finger-to-nose and heel-to-shin performed with ataxia on left side. Gait and Station: Arises from chair without difficulty. Stance is normal. Gait demonstrates normal stride length and balance Reflexes: 1+ and symmetric. Toes downgoing.     NIHSS  3 Modified Rankin  2    Diagnostic Data (Labs, Imaging, Testing)  MR BRAIN WO CONTRAST MR MRA HEAD  MR MRA NECK 07/01/2019 IMPRESSION: Acute infarction at the right parieto-occipital junction affecting a region of brain measuring about 5 x 2 x 3 cm. No evidence of hemorrhage. Moderate chronic small-vessel ischemic changes elsewhere affecting the pons in the cerebral hemispheric white matter. Intracranial MR angiography does not show any large or medium vessel occlusion. There is distal vessel atherosclerotic irregularity in both posterior cerebral artery branches, including a focal stenosis at the right P1 P2 junction. No significant neck vessel disease suspected, but detail is quite limited due to technical deficiency of the noncontrast neck MRA. The patient would not allow repeat imaging.   CT ANGIO NECK W OR WO CONTRAST 07/02/2019 IMPRESSION: 1. 45% diameter stenosis proximal right internal carotid artery. 60% diameter stenosis right external carotid artery 2. 30% diameter stenosis distal left common carotid artery. Left internal carotid artery mildly disease without significant stenosis 3. Both vertebral arteries are patent to the basilar without stenosis.   ECHOCARDIOGRAM 07/02/2019 IMPRESSIONS  1. Left ventricular ejection fraction, by visual estimation, is 60 to 65%. The left ventricle has normal function. Normal left ventricular size. There is mildly increased left ventricular hypertrophy.  2. Left ventricular  diastolic Doppler parameters are consistent with impaired relaxation pattern of LV diastolic filling.  3. Trivial pericardial effusion is present.  4. The aortic valve is tricuspid Aortic valve regurgitation was not visualized by color flow Doppler. Structurally normal aortic valve, with no evidence of sclerosis or stenosis.  5. The tricuspid valve is normal in structure. Tricuspid valve regurgitation is trivial.  6. Global right ventricle has normal systolic function.The right ventricular size is normal. No increase in right ventricular wall thickness.  7. Mild mitral annular calcification.  8. The mitral valve is normal in structure. No evidence of mitral valve regurgitation. No evidence of mitral stenosis.  9. Right atrial size was normal. 10. Left atrial size was normal. 11. The tricuspid regurgitant velocity is 2.30 m/s, and with an assumed right atrial pressure of 3 mmHg, the estimated right ventricular systolic pressure is normal at 24.2 mmHg. 12. The inferior vena cava is normal in size with greater than 50% respiratory variability, suggesting right atrial pressure of 3 mmHg.    ASSESSMENT: Meagan [name] is a 68 y.o. 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 Vascular risk factors include HTN, HLD, DM, OSA and intercranial stenosis.  Residual deficits of mild left hemiparesis, mild dysarthria with slowed speech and left homonymous hemianopia.    PLAN:  1. Right PCA stroke: Continue aspirin 81 mg daily and clopidogrel 75 mg daily  and atorvastatin for secondary stroke prevention.  Complete 3 months DAPT and continues on Plavix alone.  Recommended 30-day cardiac event monitor outpatient and provided sister with cardiology phone number as they had multiple questions regarding monitor.  Maintain strict control of hypertension with blood pressure goal  below  130/90, diabetes with hemoglobin A1c goal below 6.5% and cholesterol with LDL cholesterol (bad cholesterol) goal below 70 mg/dL.  I also advised the patient to eat a healthy diet with plenty of whole grains, cereals, fruits and vegetables, exercise regularly with at least 30 minutes of continuous activity daily and maintain ideal body weight. 2. HTN: Advised to continue current treatment regimen.  Today's BP stable.  Advised to continue to monitor at home along with continued follow-up with PCP for management 3. HLD: Advised to continue current treatment regimen along with continued follow-up with PCP for future prescribing and monitoring of lipid panel 4. DMII: Advised to continue to monitor glucose levels at home along with continued follow-up with PCP for management and monitoring 5. Intracranial stenosis: Complete 3 months DAPT and then Plavix alone.  Continuation of statin.  Importance of stroke risk factor modification and management 6. Residual deficits as above: Continue to participate with outpatient PT/OT.  May consider additional speech therapy in the future if needed.  Continue to follow with ophthalmology for ongoing evaluation and monitoring of visual loss.  Advised to continue to ambulate with rolling walker for fall prevention.    Follow up in 3 months or call earlier if needed   Greater than 50% of time during this 45 minute visit was spent on counseling, explanation of diagnosis of right PCA stroke, reviewing risk factor management of intracranial stenosis, HTN, HLD and DM, long discussion regarding residual deficits, planning of further management along with potential future management, and discussion with patient and family answering all questions.    Frann Rider, AGNP-BC  [facility] [address] [place], [place] [address]  Phone [phone] Fax [phone] Note: This document was prepared with digital dictation and possible smart  phrase technology. Any transcriptional errors that result from this process are unintentional.

## 2019-08-06 NOTE — Telephone Encounter (Signed)
  Sister is calling to get direction how to put the heart monitor on. They did receive it but need assistance.

## 2019-08-06 NOTE — Telephone Encounter (Signed)
Patient scheduled to come into office Monday, 08/10/19 at 12:00 PM to have her cardiac event monitor applied and to explain instructions to her sister.  Patients sister, Kerrin Mo will accompany Meagan [name] to her appointment. Patient has had a stroke which has also affected her vision.

## 2019-08-06 NOTE — Patient Instructions (Signed)
Continue to work with physical and Occupational Therapy for ongoing improvement  Follow-up with ophthalmology in December and request exam notes to be faxed to our office after visit  Please contact cardiology office regarding cardiac monitor at [phone]  Continue aspirin 325 mg daily and clopidogrel 75 mg daily  and Lipitor for secondary stroke prevention  Continue aspirin and Plavix for an additional 2 months (10/09/2019) and then stop aspirin and continue Plavix alone   Continue to follow up with PCP regarding cholesterol, blood pressure and diabetes management   Continue to monitor blood pressure at home  Maintain strict control of hypertension with blood pressure goal below 130/90, diabetes with hemoglobin A1c goal below 6.5% and cholesterol with LDL cholesterol (bad cholesterol) goal below 70 mg/dL. I also advised the patient to eat a healthy diet with plenty of whole grains, cereals, fruits and vegetables, exercise regularly and maintain ideal body weight.  Followup in the future with me in 3 months or call earlier if needed       Thank you for coming to see [place] at [facility]. I hope we have been able to provide you high quality care today.  You may receive a patient satisfaction survey over the next few weeks. We would appreciate your feedback and comments so that we may continue to improve ourselves and the health of our patients.

## 2019-08-10 ENCOUNTER — Other Ambulatory Visit: Payer: Self-pay

## 2019-08-10 ENCOUNTER — Other Ambulatory Visit: Payer: Self-pay | Admitting: Cardiology

## 2019-08-10 ENCOUNTER — Encounter (INDEPENDENT_AMBULATORY_CARE_PROVIDER_SITE_OTHER): Payer: BC Managed Care – PPO

## 2019-08-10 DIAGNOSIS — I4891 Unspecified atrial fibrillation: Secondary | ICD-10-CM

## 2019-08-10 DIAGNOSIS — I639 Cerebral infarction, unspecified: Secondary | ICD-10-CM

## 2019-08-20 ENCOUNTER — Telehealth: Payer: Self-pay

## 2019-08-20 NOTE — Telephone Encounter (Signed)
Called lmom informing patient of appointment. klh 

## 2019-08-21 ENCOUNTER — Telehealth: Payer: Self-pay

## 2019-08-21 NOTE — Telephone Encounter (Signed)
Confirmed appointment with patient. klh °

## 2019-08-24 ENCOUNTER — Encounter: Payer: Self-pay | Admitting: Family Medicine

## 2019-08-24 ENCOUNTER — Other Ambulatory Visit (HOSPITAL_COMMUNITY)
Admission: RE | Admit: 2019-08-24 | Discharge: 2019-08-24 | Disposition: A | Discharge status: Home / Self Care | Payer: BC Managed Care – PPO | Source: Ambulatory Visit | Attending: Family Medicine | Admitting: Family Medicine

## 2019-08-24 ENCOUNTER — Encounter: Payer: Self-pay | Admitting: Internal Medicine

## 2019-08-24 ENCOUNTER — Ambulatory Visit: Payer: BC Managed Care – PPO | Admitting: Internal Medicine

## 2019-08-24 ENCOUNTER — Ambulatory Visit: Payer: BC Managed Care – PPO | Admitting: Family Medicine

## 2019-08-24 ENCOUNTER — Other Ambulatory Visit: Payer: Self-pay

## 2019-08-24 VITALS — BP 135/79 | HR 100 | Temp 96.8°F | Resp 16 | Ht 64.0 in | Wt 193.0 lb

## 2019-08-24 VITALS — BP 130/80 | HR 106 | Temp 96.9°F | Resp 16 | Ht 64.0 in | Wt 193.9 lb

## 2019-08-24 DIAGNOSIS — E1169 Type 2 diabetes mellitus with other specified complication: Secondary | ICD-10-CM

## 2019-08-24 DIAGNOSIS — G4733 Obstructive sleep apnea (adult) (pediatric): Secondary | ICD-10-CM

## 2019-08-24 DIAGNOSIS — I10 Essential (primary) hypertension: Secondary | ICD-10-CM

## 2019-08-24 DIAGNOSIS — I6930 Unspecified sequelae of cerebral infarction: Secondary | ICD-10-CM | POA: Diagnosis not present

## 2019-08-24 DIAGNOSIS — J301 Allergic rhinitis due to pollen: Secondary | ICD-10-CM | POA: Diagnosis not present

## 2019-08-24 DIAGNOSIS — Z8673 Personal history of transient ischemic attack (TIA), and cerebral infarction without residual deficits: Secondary | ICD-10-CM | POA: Diagnosis not present

## 2019-08-24 DIAGNOSIS — Z124 Encounter for screening for malignant neoplasm of cervix: Secondary | ICD-10-CM | POA: Diagnosis not present

## 2019-08-24 DIAGNOSIS — E785 Hyperlipidemia, unspecified: Secondary | ICD-10-CM

## 2019-08-24 NOTE — Progress Notes (Signed)
[facility] [place], [place] [address]  Pulmonary Sleep Medicine   Office Visit Note  Patient Name: Meagan [name] DOB: [birth year] MRN [id number]  Date of Service: 08/24/2019  Complaints/HPI: OSA patient has a history of obstructive sleep apnea diagnosed approximately 12 years ago.  She does have a new CPAP machine that she got originally.  She recently just had a stroke and now uses a walker to get around.  Her sister who is her primary caregiver states that she does snore very loudly she is also sleeps easily.  She is does not drive so it is hard to assess whether she falls asleep driving she feels tired though in the passenger seat.  Patient does have headaches."  As already mentioned she just recently had a stroke.  She is also currently undergoing cardiac evaluation with a Holter monitor for 1 month for some type of arrhythmia.  Patient has no active chest pain she states that when the CPAP device was working properly it did help her symptoms.  Has noticed no component of narcolepsy.  ROS  General: (-) fever, (-) chills, (-) night sweats, (-) weakness Skin: (-) rashes, (-) itching,. Eyes: (-) visual changes, (-) redness, (-) itching. Nose and Sinuses: (-) nasal stuffiness or itchiness, (-) postnasal drip, (-) nosebleeds, (-) sinus trouble. Mouth and Throat: (-) sore throat, (-) hoarseness. Neck: (-) swollen glands, (-) enlarged thyroid, (-) neck pain. Respiratory: - cough, (-) bloody sputum, + shortness of breath, - wheezing. Cardiovascular: - ankle swelling, (-) chest pain. Lymphatic: (-) lymph node enlargement. Neurologic: (-) numbness, (-) tingling. Psychiatric: (-) anxiety, (-) depression   Current Medication: Outpatient Encounter Medications as of 08/24/2019  Medication Sig  . amLODipine (NORVASC) 5 MG tablet Take 1 tablet (5 mg total) by mouth daily.  Marland Kitchen aspirin 325 MG tablet Take 1 tablet (325 mg total) by mouth daily. Take for 3 months only.  Marland Kitchen  atorvastatin (LIPITOR) 80 MG tablet Take 1 tablet (80 mg total) by mouth daily at 6 PM.  . blood glucose meter kit and supplies Dispense based on patient and insurance preference. Use up to four times daily as directed. (FOR ICD-10 E10.9, E11.9).  Marland Kitchen clopidogrel (PLAVIX) 75 MG tablet Take 1 tablet (75 mg total) by mouth daily.  . hydrALAZINE (APRESOLINE) 10 MG tablet Take 1 tablet (10 mg total) by mouth 3 (three) times daily as needed. If bp goes above 150/90  . Insulin Detemir (LEVEMIR FLEXTOUCH) 100 UNIT/ML Pen Inject 25-50 Units into the skin daily.  . Insulin Pen Needle 32G X 4 MM MISC 1 each by Does not apply route daily.  Marland Kitchen levocetirizine (XYZAL) 5 MG tablet Take 5 mg by mouth as needed for allergies.  . metFORMIN (GLUCOPHAGE-XR) 750 MG 24 hr tablet Take 1 tablet (750 mg total) by mouth 2 (two) times daily.  Marland Kitchen olmesartan-hydrochlorothiazide (BENICAR HCT) 40-25 MG tablet Take 1 tablet by mouth daily.  . polyethylene glycol (MIRALAX / GLYCOLAX) 17 g packet Take 17 g by mouth daily.  . Semaglutide, 1 MG/DOSE, (OZEMPIC, 1 MG/DOSE,) 2 MG/1.5ML SOPN Inject 1 mg into the skin every 7 (seven) days.  . traMADol (ULTRAM) 50 MG tablet Take 1 tablet (50 mg total) by mouth every 6 (six) hours as needed for moderate pain.   No facility-administered encounter medications on file as of 08/24/2019.     Surgical History: Past Surgical History:  Procedure Laterality Date  . APPENDECTOMY  2000s  . BIOPSY THYROID  2006  [facility]  . BREAST EXCISIONAL BIOPSY Left 2000s   neg  . CARPAL TUNNEL RELEASE Left March 2015  . COLONOSCOPY  2011   DR.WOHL  . COLONOSCOPY WITH PROPOFOL N/A 07/11/2017   Procedure: COLONOSCOPY WITH PROPOFOL;  Surgeon: Jonathon Bellows, MD;  Location[facility] ENDOSCOPY;  Service: Gastroenterology;  Laterality: N/A;  . ganglion cyst removal     . KNEE SURGERY Left 08/2009  . KNEE SURGERY Right 04/07/2010  . ROTATOR CUFF REPAIR Right 2008  . TUBAL LIGATION      Medical History: Past Medical  History:  Diagnosis Date  . Cellulitis    left leg  . 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 Diffuse cystic mastopathy   . Fall 2013  . Hypertension 2004  . Occlusion and stenosis of carotid artery without mention of cerebral infarction   . Sleep apnea     Family History: Family History  Problem Relation Age of Onset  . Breast cancer Maternal Aunt   . Hypertension Mother   . Kidney Stones Mother   . Hypertension Father   . Congestive Heart Failure Father   . Hypertension Paternal Grandfather   . Diabetes Sister   . Vision loss Sister   . Varicose Veins Sister   . Hypertension Sister   . Breast cancer Sister 19       Double Mastectomy  . Hypertension Sister     Social History: Social History   Socioeconomic History  . Marital status: Widowed    Spouse name: Not on file  . Number of children: 4  . Years of education: Not on file  . Highest education level: Master's degree (e.g., MA, MS, MEng, MEd, MSW, MBA)  Occupational History  . Occupation: Higher education careers adviser   Social Needs  . Financial resource strain: Not hard at all  . Food insecurity    Worry: Never true    Inability: Never true  . Transportation needs    Medical: No    Non-medical: No  Tobacco Use  . Smoking status: Never Smoker  . Smokeless tobacco: Never Used  Substance and Sexual Activity  . Alcohol use: No  . Drug use: No  . Sexual activity: Not Currently    Partners: Male    Comment: Widow for 14 years  Lifestyle  . Physical activity    Days per week: 0 days    Minutes per session: 0 min  . Stress: Not at all  Relationships  . Social connections    Talks on phone: More than three times a week    Gets together: More than three times a week    Attends religious service: More than 4 times per year    Active member of club or organization: Yes    Attends meetings of clubs or organizations: More than 4 times per year    Relationship status: Widowed  . Intimate  partner violence    Fear of current or ex partner: No    Emotionally abused: No    Physically abused: No    Forced sexual activity: No  Other Topics Concern  . Not on file  Social History Narrative   She had a stroke on 06/2019, she has left peripheral vision loss since stroke   She moved in with her youngest sister     Vital Signs: Blood pressure 135/79, pulse 100, temperature (!) 96.8 F (36 C), resp. rate 16, height '5\' 4"'  (1.626 m), weight 193 lb (87.5 kg), SpO2 98 %.  Examination: General Appearance: The patient is well-developed, well-nourished, and in no distress. Skin: Gross inspection of skin unremarkable. Head: normocephalic, no gross deformities. Eyes: no gross deformities noted. ENT: ears appear grossly normal no exudates. Neck: Supple. No thyromegaly. No LAD. Respiratory: no rhonchi noted. Cardiovascular: Normal S1 and S2 without murmur or rub. Extremities: No cyanosis. pulses are equal. Neurologic: Alert and oriented. No involuntary movements.  LABS: Recent Results (from the past 2160 hour(s))  HM DIABETES EYE EXAM     Status: Abnormal   Collection Time: 05/27/19 12:00 AM  Result Value Ref Range   HM Diabetic Eye Exam Retinopathy (A) No Retinopathy    Comment: Dr. Genia Hotter non-proliferative diabetic retinopa  Lipase, blood     Status: None   Collection Time: 07/01/19 12:04 PM  Result Value Ref Range   Lipase 33 11 - 51 U/L    Comment: Performed at [facility], [place] [address] [place], [place] [address]  Comprehensive metabolic panel     Status: Abnormal   Collection Time: 07/01/19 12:04 PM  Result Value Ref Range   Sodium 132 (L) 135 - 145 mmol/L   Potassium 3.9 3.5 - 5.1 mmol/L   Chloride 93 (L) 98 - 111 mmol/L   CO2 25 22 - 32 mmol/L   Glucose, Bld 316 (H) 70 - 99 mg/dL   BUN 11 8 - 23 mg/dL   Creatinine, Ser 0.82 0.44 - 1.00 mg/dL   Calcium 9.0 8.9 - 10.3 mg/dL   Total Protein 7.7 6.5 - 8.1 g/dL   Albumin 3.1 (L) 3.5 - 5.0 g/dL    AST 17 15 - 41 U/L   ALT 18 0 - 44 U/L   Alkaline Phosphatase 126 38 - 126 U/L   Total Bilirubin 0.7 0.3 - 1.2 mg/dL   GFR calc non Af Amer >60 >60 mL/min   GFR calc Af Amer >60 >60 mL/min   Anion gap 14 5 - 15    Comment: Performed at [facility], [place] [address], [place], [place] [address]  CBC     Status: Abnormal   Collection Time: 07/01/19 12:04 PM  Result Value Ref Range   WBC 11.9 (H) 4.0 - 10.5 K/uL   RBC 5.40 (H) 3.87 - 5.11 MIL/uL   Hemoglobin 16.5 (H) 12.0 - 15.0 g/dL   HCT 46.8 (H) 36.0 - 46.0 %   MCV 86.7 80.0 - 100.0 fL   MCH 30.6 26.0 - 34.0 pg   MCHC 35.3 30.0 - 36.0 g/dL   RDW 12.1 11.5 - 15.5 %   Platelets 297 150 - 400 K/uL   nRBC 0.0 0.0 - 0.2 %    Comment: Performed at [facility], [place] [address], [place], [place] [address]  Sedimentation rate     Status: Abnormal   Collection Time: 07/01/19  2:30 PM  Result Value Ref Range   Sed Rate 55 (H) 0 - 22 mm/hr    Comment: Performed at [facility] [address], [place], [place] [address]  C-reactive protein     Status: Abnormal   Collection Time: 07/01/19  2:30 PM  Result Value Ref Range   CRP 1.2 (H) <1.0 mg/dL    Comment: Performed at [facility], [place] [address] [place], [place] [address]  Urinalysis, Routine w reflex microscopic     Status: Abnormal   Collection Time: 07/01/19  3:08 PM  Result Value Ref Range   Color, Urine YELLOW YELLOW  APPearance CLEAR CLEAR   Specific Gravity, Urine 1.008 1.005 - 1.030   pH 6.0 5.0 - 8.0   Glucose, UA >=500 (A) NEGATIVE mg/dL   Hgb urine dipstick NEGATIVE NEGATIVE   Bilirubin Urine NEGATIVE NEGATIVE   Ketones, ur NEGATIVE NEGATIVE mg/dL   Protein, ur 100 (A) NEGATIVE mg/dL   Nitrite NEGATIVE NEGATIVE   Leukocytes,Ua NEGATIVE NEGATIVE   RBC / HPF 0-5 0 - 5 RBC/hpf   WBC, UA 0-5 0 - 5 WBC/hpf   Bacteria, UA RARE (A) NONE SEEN   Squamous Epithelial / LPF 0-5 0 - 5   Hyaline Casts, UA PRESENT     Comment: Performed at [facility] [address], [place], [place] [address]  SARS CORONAVIRUS 2 (TAT 6-24 HRS) Nasopharyngeal Nasopharyngeal Swab     Status: None   Collection Time: 07/01/19  6:59 PM   Specimen: Nasopharyngeal Swab  Result Value Ref Range   SARS Coronavirus 2 NEGATIVE NEGATIVE    Comment: (NOTE) SARS-CoV-2 target nucleic acids are NOT DETECTED. The SARS-CoV-2 RNA is generally detectable in upper and lower respiratory specimens during the acute phase of infection. Negative results do not preclude SARS-CoV-2 infection, do not rule out co-infections with other pathogens, and should not be used as the sole basis for treatment or other patient management decisions. Negative results must be combined with clinical observations, patient history, and epidemiological information. The expected result is Negative. Fact Sheet for Patients: SugarRoll.be Fact Sheet for Healthcare Providers: https://www.woods-mathews.com/ This test is not yet approved or cleared by the [place] FDA and  has been authorized for detection and/or diagnosis of SARS-CoV-2 by FDA under an Emergency Use Authorization (EUA). This EUA will remain  in effect (meaning this test can be used) for the duration of the COVID-19 declaration under Section 56 4(b)(1) of the Act, 21 U.S.C. section 360bbb-3(b)(1), unless the authorization is terminated or revoked sooner. Performed at [facility], [place] [address] [place], [place] [address]   CBG monitoring, ED     Status: Abnormal   Collection Time: 07/01/19 11:21 PM  Result Value Ref Range   Glucose-Capillary 327 (H) 70 - 99 mg/dL  HIV Antibody  (Routine Testing)     Status: None   Collection Time: 07/02/19  4:11 AM  Result Value Ref Range   HIV Screen 4th Generation wRfx Non Reactive Non Reactive    Comment: (NOTE) Performed At: [facility] [place], [place] [id number] Rush Farmer MD QM:[id number]    Hemoglobin A1c     Status: Abnormal   Collection Time: 07/02/19  4:11 AM  Result Value Ref Range   Hgb A1c MFr Bld 10.1 (H) 4.8 - 5.6 %    Comment: (NOTE) Pre diabetes:          5.7%-6.4% Diabetes:              >6.4% Glycemic control for   <7.0% adults with diabetes    Mean Plasma Glucose 243.17 mg/dL    Comment: Performed at [facility] [address] [place], [place] [address]  Lipid panel     Status: Abnormal   Collection Time: 07/02/19  4:11 AM  Result Value Ref Range   Cholesterol 267 (H) 0 - 200 mg/dL   Triglycerides 182 (H) <150 mg/dL   HDL 32 (L) >40 mg/dL   Total CHOL/HDL Ratio 8.3 RATIO   VLDL 36 0 - 40 mg/dL   LDL Cholesterol 199 (H) 0 -  99 mg/dL    Comment:        Total Cholesterol/HDL:CHD Risk Coronary Heart Disease Risk Table                     Men   Women  1/2 Average Risk   3.4   3.3  Average Risk       5.0   4.4  2 X Average Risk   9.6   7.1  3 X Average Risk  23.4   11.0        Use the calculated Patient Ratio above and the CHD Risk Table to determine the patient's CHD Risk.        ATP III CLASSIFICATION (LDL):  <100     mg/dL   Optimal  100-129  mg/dL   Near or Above                    Optimal  130-159  mg/dL   Borderline  160-189  mg/dL   High  >190     mg/dL   Very High Performed at [facility] [address], [place], [place] [address]   CBC     Status: Abnormal   Collection Time: 07/02/19  4:11 AM  Result Value Ref Range   WBC 11.1 (H) 4.0 - 10.5 K/uL   RBC 5.41 (H) 3.87 - 5.11 MIL/uL   Hemoglobin 16.2 (H) 12.0 - 15.0 g/dL   HCT 47.4 (H) 36.0 - 46.0 %   MCV 87.6 80.0 - 100.0 fL   MCH 29.9 26.0 - 34.0 pg   MCHC 34.2 30.0 - 36.0 g/dL   RDW 12.0 11.5 - 15.5 %   Platelets 329 150 - 400 K/uL   nRBC 0.0 0.0 - 0.2 %    Comment: Performed at [facility], [place] [address], [place], [place] [address]  CBG monitoring, ED     Status: Abnormal   Collection Time: 07/02/19  8:18 AM  Result Value Ref Range   Glucose-Capillary  284 (H) 70 - 99 mg/dL  Rapid urine drug screen (hospital performed)     Status: None   Collection Time: 07/02/19  8:28 AM  Result Value Ref Range   Opiates NONE DETECTED NONE DETECTED   Cocaine NONE DETECTED NONE DETECTED   Benzodiazepines NONE DETECTED NONE DETECTED   Amphetamines NONE DETECTED NONE DETECTED   Tetrahydrocannabinol NONE DETECTED NONE DETECTED   Barbiturates NONE DETECTED NONE DETECTED    Comment: (NOTE) DRUG SCREEN FOR MEDICAL PURPOSES ONLY.  IF CONFIRMATION IS NEEDED FOR ANY PURPOSE, NOTIFY LAB WITHIN 5 DAYS. LOWEST DETECTABLE LIMITS FOR URINE DRUG SCREEN Drug Class                     Cutoff (ng/mL) Amphetamine and metabolites    1000 Barbiturate and metabolites    200 Benzodiazepine                 286 Tricyclics and metabolites     300 Opiates and metabolites        300 Cocaine and metabolites        300 THC                            50 Performed at [facility], [place] [address] [place], [place] [address]   ECHOCARDIOGRAM COMPLETE     Status: None   Collection Time: 07/02/19 12:15  PM  Result Value Ref Range   BP 143/67 mmHg  CBG monitoring, ED     Status: Abnormal   Collection Time: 07/02/19 12:38 PM  Result Value Ref Range   Glucose-Capillary 226 (H) 70 - 99 mg/dL   Comment 1 Notify RN    Comment 2 Document in Chart   Glucose, capillary     Status: Abnormal   Collection Time: 07/02/19  5:05 PM  Result Value Ref Range   Glucose-Capillary 266 (H) 70 - 99 mg/dL  Glucose, capillary     Status: Abnormal   Collection Time: 07/02/19  8:23 PM  Result Value Ref Range   Glucose-Capillary 227 (H) 70 - 99 mg/dL  Glucose, capillary     Status: Abnormal   Collection Time: 07/02/19 11:34 PM  Result Value Ref Range   Glucose-Capillary 179 (H) 70 - 99 mg/dL   Comment 1 Notify RN   CBC     Status: None   Collection Time: 07/03/19  3:18 AM  Result Value Ref Range   WBC 9.5 4.0 - 10.5 K/uL   RBC 4.90 3.87 - 5.11 MIL/uL   Hemoglobin 14.6 12.0 - 15.0  g/dL   HCT 43.0 36.0 - 46.0 %   MCV 87.8 80.0 - 100.0 fL   MCH 29.8 26.0 - 34.0 pg   MCHC 34.0 30.0 - 36.0 g/dL   RDW 12.2 11.5 - 15.5 %   Platelets 286 150 - 400 K/uL   nRBC 0.0 0.0 - 0.2 %    Comment: Performed at [facility], [place]. [address], [place], [place] [address]  Basic metabolic panel     Status: Abnormal   Collection Time: 07/03/19  3:18 AM  Result Value Ref Range   Sodium 138 135 - 145 mmol/L   Potassium 2.9 (L) 3.5 - 5.1 mmol/L   Chloride 100 98 - 111 mmol/L   CO2 28 22 - 32 mmol/L   Glucose, Bld 136 (H) 70 - 99 mg/dL   BUN 14 8 - 23 mg/dL   Creatinine, Ser 0.80 0.44 - 1.00 mg/dL   Calcium 8.6 (L) 8.9 - 10.3 mg/dL   GFR calc non Af Amer >60 >60 mL/min   GFR calc Af Amer >60 >60 mL/min   Anion gap 10 5 - 15    Comment: Performed at [facility], [place] [address] [place],  [address]  Glucose, capillary     Status: Abnormal   Collection Time: 07/03/19  6:54 AM  Result Value Ref Range   Glucose-Capillary 156 (H) 70 - 99 mg/dL   Comment 1 Notify RN   Glucose, capillary     Status: Abnormal   Collection Time: 07/03/19 12:33 PM  Result Value Ref Range   Glucose-Capillary 224 (H) 70 - 99 mg/dL  Glucose, capillary     Status: Abnormal   Collection Time: 07/03/19  5:10 PM  Result Value Ref Range   Glucose-Capillary 225 (H) 70 - 99 mg/dL  Glucose, capillary     Status: Abnormal   Collection Time: 07/03/19  8:55 PM  Result Value Ref Range   Glucose-Capillary 268 (H) 70 - 99 mg/dL  Glucose, capillary     Status: Abnormal   Collection Time: 07/04/19  6:18 AM  Result Value Ref Range   Glucose-Capillary 184 (H) 70 - 99 mg/dL   Comment 1 Notify RN   Basic metabolic panel     Status: Abnormal   Collection Time: 07/04/19  7:48 AM  Result Value  Ref Range   Sodium 138 135 - 145 mmol/L   Potassium 4.3 3.5 - 5.1 mmol/L   Chloride 103 98 - 111 mmol/L   CO2 28 22 - 32 mmol/L   Glucose, Bld 223 (H) 70 - 99 mg/dL   BUN 13 8 - 23 mg/dL   Creatinine, Ser 0.79  0.44 - 1.00 mg/dL   Calcium 8.7 (L) 8.9 - 10.3 mg/dL   GFR calc non Af Amer >60 >60 mL/min   GFR calc Af Amer >60 >60 mL/min   Anion gap 7 5 - 15    Comment: Performed at [facility] [address], [place], [place] [address]  Glucose, capillary     Status: Abnormal   Collection Time: 07/04/19 12:12 PM  Result Value Ref Range   Glucose-Capillary 279 (H) 70 - 99 mg/dL  Glucose, capillary     Status: Abnormal   Collection Time: 07/04/19  4:52 PM  Result Value Ref Range   Glucose-Capillary 200 (H) 70 - 99 mg/dL  Glucose, capillary     Status: Abnormal   Collection Time: 07/04/19  9:26 PM  Result Value Ref Range   Glucose-Capillary 228 (H) 70 - 99 mg/dL   Comment 1 Notify RN    Comment 2 Document in Chart   Glucose, capillary     Status: Abnormal   Collection Time: 07/05/19  6:10 AM  Result Value Ref Range   Glucose-Capillary 126 (H) 70 - 99 mg/dL   Comment 1 Notify RN    Comment 2 Document in Chart   Glucose, capillary     Status: Abnormal   Collection Time: 07/05/19 11:36 AM  Result Value Ref Range   Glucose-Capillary 217 (H) 70 - 99 mg/dL   Comment 1 Notify RN    Comment 2 Document in Chart   Glucose, capillary     Status: Abnormal   Collection Time: 07/05/19  4:18 PM  Result Value Ref Range   Glucose-Capillary 183 (H) 70 - 99 mg/dL  Glucose, capillary     Status: Abnormal   Collection Time: 07/05/19  9:40 PM  Result Value Ref Range   Glucose-Capillary 226 (H) 70 - 99 mg/dL  Basic metabolic panel     Status: Abnormal   Collection Time: 07/06/19  4:12 AM  Result Value Ref Range   Sodium 138 135 - 145 mmol/L   Potassium 3.3 (L) 3.5 - 5.1 mmol/L   Chloride 99 98 - 111 mmol/L   CO2 30 22 - 32 mmol/L   Glucose, Bld 138 (H) 70 - 99 mg/dL   BUN 10 8 - 23 mg/dL   Creatinine, Ser 0.81 0.44 - 1.00 mg/dL   Calcium 8.7 (L) 8.9 - 10.3 mg/dL   GFR calc non Af Amer >60 >60 mL/min   GFR calc Af Amer >60 >60 mL/min   Anion gap 9 5 - 15    Comment: Performed at [facility], [place] [address], [place] [address]  CBC     Status: None   Collection Time: 07/06/19  4:12 AM  Result Value Ref Range   WBC 10.2 4.0 - 10.5 K/uL   RBC 4.41 3.87 - 5.11 MIL/uL   Hemoglobin 13.7 12.0 - 15.0 g/dL   HCT 38.4 36.0 - 46.0 %   MCV 87.1 80.0 - 100.0 fL   MCH 31.1 26.0 - 34.0 pg   MCHC 35.7 30.0 - 36.0 g/dL   RDW 12.1 11.5 - 15.5 %   Platelets  245 150 - 400 K/uL   nRBC 0.0 0.0 - 0.2 %    Comment: Performed at [facility], [place] [address], [place], [place] [address]  Glucose, capillary     Status: Abnormal   Collection Time: 07/06/19  6:07 AM  Result Value Ref Range   Glucose-Capillary 120 (H) 70 - 99 mg/dL  Glucose, capillary     Status: Abnormal   Collection Time: 07/06/19  9:43 AM  Result Value Ref Range   Glucose-Capillary 135 (H) 70 - 99 mg/dL  Glucose, capillary     Status: Abnormal   Collection Time: 07/06/19 12:19 PM  Result Value Ref Range   Glucose-Capillary 276 (H) 70 - 99 mg/dL   Comment 1 Notify RN    Comment 2 Document in Chart   Novel Coronavirus, NAA (Hosp order, Send-out to Ref Lab; TAT 18-24 hrs     Status: None   Collection Time: 07/06/19  2:45 PM   Specimen: Nasopharyngeal Swab; Respiratory  Result Value Ref Range   SARS-CoV-2, NAA NOT DETECTED NOT DETECTED    Comment: (NOTE) This nucleic acid amplification test was developed and its performance characteristics determined by [employer]. Nucleic acid amplification tests include PCR and TMA. This test has not been FDA cleared or approved. This test has been authorized by FDA under an Emergency Use Authorization (EUA). This test is only authorized for the duration of time the declaration that circumstances exist justifying the authorization of the emergency use of in vitro diagnostic tests for detection of SARS-CoV-2 virus and/or diagnosis of COVID-19 infection under section 564(b)(1) of the Act, 21 U.S.C. 063KZS-0(F) (1), unless the authorization is terminated  or revoked sooner. When diagnostic testing is negative, the possibility of a false negative result should be considered in the context of a patient's recent exposures and the presence of clinical signs and symptoms consistent with COVID-19. An individual without symptoms of COVID- 19 and who is not shedding SARS-CoV-2 vi rus would expect to have a negative (not detected) result in this assay. Performed At: [facility] [place], [place] [id number] Rush Farmer MD UK:[id number]    Coronavirus Source NASOPHARYNGEAL     Comment: Performed at [facility], [place] [address], [place], [place] [address]  Glucose, capillary     Status: Abnormal   Collection Time: 07/06/19  3:49 PM  Result Value Ref Range   Glucose-Capillary 265 (H) 70 - 99 mg/dL  Glucose, capillary     Status: Abnormal   Collection Time: 07/06/19  9:38 PM  Result Value Ref Range   Glucose-Capillary 141 (H) 70 - 99 mg/dL  Basic metabolic panel     Status: Abnormal   Collection Time: 07/07/19  3:58 AM  Result Value Ref Range   Sodium 137 135 - 145 mmol/L   Potassium 3.3 (L) 3.5 - 5.1 mmol/L   Chloride 100 98 - 111 mmol/L   CO2 29 22 - 32 mmol/L   Glucose, Bld 161 (H) 70 - 99 mg/dL   BUN 11 8 - 23 mg/dL   Creatinine, Ser 0.76 0.44 - 1.00 mg/dL   Calcium 8.5 (L) 8.9 - 10.3 mg/dL   GFR calc non Af Amer >60 >60 mL/min   GFR calc Af Amer >60 >60 mL/min   Anion gap 8 5 - 15    Comment: Performed at [facility], [place] [address] [place], [place] [address]  Glucose, capillary     Status: Abnormal   Collection Time:  07/07/19  6:28 AM  Result Value Ref Range   Glucose-Capillary 126 (H) 70 - 99 mg/dL  Glucose, capillary     Status: Abnormal   Collection Time: 07/07/19 11:29 AM  Result Value Ref Range   Glucose-Capillary 205 (H) 70 - 99 mg/dL   Comment 1 Notify RN    Comment 2 Document in Chart   Glucose, capillary     Status: Abnormal   Collection Time: 07/07/19  3:47 PM  Result Value  Ref Range   Glucose-Capillary 147 (H) 70 - 99 mg/dL   Comment 1 Notify RN    Comment 2 Document in Chart   Glucose, capillary     Status: Abnormal   Collection Time: 07/07/19  9:02 PM  Result Value Ref Range   Glucose-Capillary 207 (H) 70 - 99 mg/dL  Basic metabolic panel     Status: Abnormal   Collection Time: 07/08/19  3:33 AM  Result Value Ref Range   Sodium 137 135 - 145 mmol/L   Potassium 3.5 3.5 - 5.1 mmol/L   Chloride 99 98 - 111 mmol/L   CO2 28 22 - 32 mmol/L   Glucose, Bld 131 (H) 70 - 99 mg/dL   BUN 10 8 - 23 mg/dL   Creatinine, Ser 0.77 0.44 - 1.00 mg/dL   Calcium 8.7 (L) 8.9 - 10.3 mg/dL   GFR calc non Af Amer >60 >60 mL/min   GFR calc Af Amer >60 >60 mL/min   Anion gap 10 5 - 15    Comment: Performed at [facility], [place] [address], [place], [place] [address]  Glucose, capillary     Status: Abnormal   Collection Time: 07/08/19  6:36 AM  Result Value Ref Range   Glucose-Capillary 120 (H) 70 - 99 mg/dL  Glucose, capillary     Status: Abnormal   Collection Time: 07/08/19 11:39 AM  Result Value Ref Range   Glucose-Capillary 219 (H) 70 - 99 mg/dL  Glucose, capillary     Status: Abnormal   Collection Time: 07/08/19  4:15 PM  Result Value Ref Range   Glucose-Capillary 155 (H) 70 - 99 mg/dL  COMPLETE METABOLIC PANEL WITH GFR     Status: Abnormal   Collection Time: 07/24/19 12:00 AM  Result Value Ref Range   Glucose, Bld 177 (H) 65 - 99 mg/dL    Comment: .            Fasting reference interval . For someone without known diabetes, a glucose value >125 mg/dL indicates that they may have diabetes and this should be confirmed with a follow-up test. .    BUN 15 7 - 25 mg/dL   Creat 0.85 0.50 - 0.99 mg/dL    Comment: For patients >40 years of age, the reference limit for Creatinine is approximately 13% higher for people identified as African-American. .    GFR, Est Non African American 71 > OR = 60 mL/min/1.28m   GFR, Est African American 83 > OR = 60  mL/min/1.750m  BUN/Creatinine Ratio NOT APPLICABLE 6 - 22 (calc)   Sodium 140 135 - 146 mmol/L   Potassium 3.5 3.5 - 5.3 mmol/L   Chloride 100 98 - 110 mmol/L   CO2 28 20 - 32 mmol/L   Calcium 9.3 8.6 - 10.4 mg/dL   Total Protein 6.8 6.1 - 8.1 g/dL   Albumin 3.7 3.6 - 5.1 g/dL   Globulin 3.1 1.9 - 3.7 g/dL (calc)   AG Ratio 1.2 1.0 -  2.5 (calc)   Total Bilirubin 0.4 0.2 - 1.2 mg/dL   Alkaline phosphatase (APISO) 114 37 - 153 U/L   AST 14 10 - 35 U/L   ALT 14 6 - 29 U/L    Radiology: Ct Angio Neck W Or Wo Contrast  Result Date: 07/02/2019 CLINICAL DATA:  Stroke.  Right occipital infarct. EXAM: CT ANGIOGRAPHY NECK TECHNIQUE: Multidetector CT imaging of the neck was performed using the standard protocol during bolus administration of intravenous contrast. Multiplanar CT image reconstructions and MIPs were obtained to evaluate the vascular anatomy. Carotid stenosis measurements (when applicable) are obtained utilizing NASCET criteria, using the distal internal carotid diameter as the denominator. CONTRAST:  30m OMNIPAQUE IOHEXOL 350 MG/ML SOLN COMPARISON:  MRI head 07/01/2019 FINDINGS: Aortic arch: Standard branching. Imaged portion shows no evidence of aneurysm or dissection. No significant stenosis of the major arch vessel origins. Right carotid system: Atherosclerotic calcified plaque right carotid bifurcation. Right internal carotid artery narrowed to 2.5 mm corresponding to 45% diameter stenosis. Approximately 60% diameter stenosis proximal right external carotid artery. Left carotid system: Atherosclerotic disease distal left common carotid artery narrowing the lumen by 30% diameter stenosis. Atherosclerotic calcification proximal left internal carotid artery without significant stenosis. Vertebral arteries: Left vertebral artery dominant. Widely patent to the basilar. Non dominant right vertebral artery widely patent to the basilar without stenosis. Skeleton: Mild cervical spine degenerative  change. No acute skeletal abnormality. Other neck: Negative for mass or adenopathy. Upper chest: Negative IMPRESSION: 1. 45% diameter stenosis proximal right internal carotid artery. 60% diameter stenosis right external carotid artery 2. 30% diameter stenosis distal left common carotid artery. Left internal carotid artery mildly disease without significant stenosis 3. Both vertebral arteries are patent to the basilar without stenosis. Electronically Signed   By: CFranchot GalloM.D.   On: 07/02/2019 09:52   Mr Angio Head Wo Contrast  Result Date: 07/01/2019 CLINICAL DATA:  Acute severe headache. Arterial dissection suspected. Possible temporal arteritis. EXAM: MRI HEAD WITHOUT CONTRAST MRA HEAD WITHOUT CONTRAST MRA NECK WITHOUT CONTRAST TECHNIQUE: Multiplanar, multiecho pulse sequences of the brain and surrounding structures were obtained without intravenous contrast. Angiographic images of the Circle of Willis were obtained using MRA technique without intravenous contrast. Angiographic images of the neck were obtained using MRA technique without intravenous contrast. Carotid stenosis measurements (when applicable) are obtained utilizing NASCET criteria, using the distal internal carotid diameter as the denominator. COMPARISON:  12/19/2006 FINDINGS: MRI HEAD FINDINGS Brain: Diffusion imaging shows acute infarction at the parieto-occipital junction region on the right affecting an area measuring approximately 5 x 2 x 3 cm. Mild swelling but no visible hemorrhage. No other acute finding. There chronic small-vessel ischemic changes of the pons. No focal cerebellar insult. Cerebral hemispheres show moderate chronic small-vessel ischemic changes throughout the deep and subcortical hemispheric white matter. No mass lesion, hydrocephalus or extra-axial collection. Vascular: Major vessels at the base of the brain show flow. Skull and upper cervical spine: Negative Sinuses/Orbits: Clear/normal Other: None MRA HEAD  FINDINGS Both internal carotid arteries are widely patent into the brain. No siphon stenosis. The anterior and middle cerebral vessels are patent without proximal stenosis, aneurysm or vascular malformation. Both vertebral arteries are widely patent to the basilar. No basilar stenosis. Posterior circulation branch vessels show flow. There is atherosclerotic narrowing and irregularity of the PCA branches, with a focal stenosis at the right P1 P2 junction. MRA NECK FINDINGS The noncontrast exam is technically deficient but the patient could not tolerate repeat. It can be stated  that there is antegrade flow in both vertebral arteries with the left being dominant. Both vertebral arteries reach the basilar. No basilar stenosis is seen of the proximal vessel. Little information can be obtained regarding the carotid arteries in the neck. Both common carotid arteries do show antegrade flow and both internal carotid arteries do appear to be patent. IMPRESSION: Acute infarction at the right parieto-occipital junction affecting a region of brain measuring about 5 x 2 x 3 cm. No evidence of hemorrhage. Moderate chronic small-vessel ischemic changes elsewhere affecting the pons in the cerebral hemispheric white matter. Intracranial MR angiography does not show any large or medium vessel occlusion. There is distal vessel atherosclerotic irregularity in both posterior cerebral artery branches, including a focal stenosis at the right P1 P2 junction. No significant neck vessel disease suspected, but detail is quite limited due to technical deficiency of the noncontrast neck MRA. The patient would not allow repeat imaging. Electronically Signed   By: Nelson Chimes M.D.   On: 07/01/2019 18:27   Mr Angio Neck Wo Contrast  Result Date: 07/01/2019 CLINICAL DATA:  Acute severe headache. Arterial dissection suspected. Possible temporal arteritis. EXAM: MRI HEAD WITHOUT CONTRAST MRA HEAD WITHOUT CONTRAST MRA NECK WITHOUT CONTRAST  TECHNIQUE: Multiplanar, multiecho pulse sequences of the brain and surrounding structures were obtained without intravenous contrast. Angiographic images of the Circle of Willis were obtained using MRA technique without intravenous contrast. Angiographic images of the neck were obtained using MRA technique without intravenous contrast. Carotid stenosis measurements (when applicable) are obtained utilizing NASCET criteria, using the distal internal carotid diameter as the denominator. COMPARISON:  12/19/2006 FINDINGS: MRI HEAD FINDINGS Brain: Diffusion imaging shows acute infarction at the parieto-occipital junction region on the right affecting an area measuring approximately 5 x 2 x 3 cm. Mild swelling but no visible hemorrhage. No other acute finding. There chronic small-vessel ischemic changes of the pons. No focal cerebellar insult. Cerebral hemispheres show moderate chronic small-vessel ischemic changes throughout the deep and subcortical hemispheric white matter. No mass lesion, hydrocephalus or extra-axial collection. Vascular: Major vessels at the base of the brain show flow. Skull and upper cervical spine: Negative Sinuses/Orbits: Clear/normal Other: None MRA HEAD FINDINGS Both internal carotid arteries are widely patent into the brain. No siphon stenosis. The anterior and middle cerebral vessels are patent without proximal stenosis, aneurysm or vascular malformation. Both vertebral arteries are widely patent to the basilar. No basilar stenosis. Posterior circulation branch vessels show flow. There is atherosclerotic narrowing and irregularity of the PCA branches, with a focal stenosis at the right P1 P2 junction. MRA NECK FINDINGS The noncontrast exam is technically deficient but the patient could not tolerate repeat. It can be stated that there is antegrade flow in both vertebral arteries with the left being dominant. Both vertebral arteries reach the basilar. No basilar stenosis is seen of the proximal  vessel. Little information can be obtained regarding the carotid arteries in the neck. Both common carotid arteries do show antegrade flow and both internal carotid arteries do appear to be patent. IMPRESSION: Acute infarction at the right parieto-occipital junction affecting a region of brain measuring about 5 x 2 x 3 cm. No evidence of hemorrhage. Moderate chronic small-vessel ischemic changes elsewhere affecting the pons in the cerebral hemispheric white matter. Intracranial MR angiography does not show any large or medium vessel occlusion. There is distal vessel atherosclerotic irregularity in both posterior cerebral artery branches, including a focal stenosis at the right P1 P2 junction. No significant neck vessel disease  suspected, but detail is quite limited due to technical deficiency of the noncontrast neck MRA. The patient would not allow repeat imaging. Electronically Signed   By: Nelson Chimes M.D.   On: 07/01/2019 18:27   Mr Brain Wo Contrast (neuro Protocol)  Result Date: 07/01/2019 CLINICAL DATA:  Acute severe headache. Arterial dissection suspected. Possible temporal arteritis. EXAM: MRI HEAD WITHOUT CONTRAST MRA HEAD WITHOUT CONTRAST MRA NECK WITHOUT CONTRAST TECHNIQUE: Multiplanar, multiecho pulse sequences of the brain and surrounding structures were obtained without intravenous contrast. Angiographic images of the Circle of Willis were obtained using MRA technique without intravenous contrast. Angiographic images of the neck were obtained using MRA technique without intravenous contrast. Carotid stenosis measurements (when applicable) are obtained utilizing NASCET criteria, using the distal internal carotid diameter as the denominator. COMPARISON:  12/19/2006 FINDINGS: MRI HEAD FINDINGS Brain: Diffusion imaging shows acute infarction at the parieto-occipital junction region on the right affecting an area measuring approximately 5 x 2 x 3 cm. Mild swelling but no visible hemorrhage. No other  acute finding. There chronic small-vessel ischemic changes of the pons. No focal cerebellar insult. Cerebral hemispheres show moderate chronic small-vessel ischemic changes throughout the deep and subcortical hemispheric white matter. No mass lesion, hydrocephalus or extra-axial collection. Vascular: Major vessels at the base of the brain show flow. Skull and upper cervical spine: Negative Sinuses/Orbits: Clear/normal Other: None MRA HEAD FINDINGS Both internal carotid arteries are widely patent into the brain. No siphon stenosis. The anterior and middle cerebral vessels are patent without proximal stenosis, aneurysm or vascular malformation. Both vertebral arteries are widely patent to the basilar. No basilar stenosis. Posterior circulation branch vessels show flow. There is atherosclerotic narrowing and irregularity of the PCA branches, with a focal stenosis at the right P1 P2 junction. MRA NECK FINDINGS The noncontrast exam is technically deficient but the patient could not tolerate repeat. It can be stated that there is antegrade flow in both vertebral arteries with the left being dominant. Both vertebral arteries reach the basilar. No basilar stenosis is seen of the proximal vessel. Little information can be obtained regarding the carotid arteries in the neck. Both common carotid arteries do show antegrade flow and both internal carotid arteries do appear to be patent. IMPRESSION: Acute infarction at the right parieto-occipital junction affecting a region of brain measuring about 5 x 2 x 3 cm. No evidence of hemorrhage. Moderate chronic small-vessel ischemic changes elsewhere affecting the pons in the cerebral hemispheric white matter. Intracranial MR angiography does not show any large or medium vessel occlusion. There is distal vessel atherosclerotic irregularity in both posterior cerebral artery branches, including a focal stenosis at the right P1 P2 junction. No significant neck vessel disease suspected,  but detail is quite limited due to technical deficiency of the noncontrast neck MRA. The patient would not allow repeat imaging. Electronically Signed   By: Nelson Chimes M.D.   On: 07/01/2019 18:27    No results found.  No results found.    Assessment and Plan: Patient Active Problem List   Diagnosis Date Noted  . History of CVA with residual deficit 07/01/2019  . Leukocytosis 07/01/2019  . Polycythemia 07/01/2019  . Morbid obesity ([place]) 10/22/2018  . Non compliance with medical treatment 03/26/2018  . Dyslipidemia associated with type 2 diabetes mellitus ([place]) 12/04/2016  . Uncontrolled type 2 diabetes mellitus with microalbuminuria, without long-term current use of insulin ([place]) 12/04/2016  . Seasonal allergies 02/01/2016  . OSA (obstructive sleep apnea) 10/06/2015  . Hypertensive retinopathy 10/06/2015  .  Thyroid cyst 10/06/2015  . Dyslipidemia 10/06/2015  . Type 2 diabetes, uncontrolled, with neuropathy ([place]) 10/06/2015  . History of pneumonia 10/06/2015  . Chronic neck pain 10/06/2015  . Occlusion and stenosis of carotid artery without mention of cerebral infarction   . Hypertension, benign     1. OSA I was not able to locate the original sleep study anywhere in her chart.  She is going to have a follow-up sleep study done because of her underlying history of stroke and cardiac condition she could do a in lab study however her sister will pick up the home study and we will attempt to do the home study first for her convenience.  Once this is done then will be able to get her new CPAP device. 2. Stroke in last 3 months this is stable she is going to continue with her present management 3. HTN likely the cause of the stroke up above she says that her pressure is better controlled today pressure was 135/79.  Will defer this to her primary care provider Dr. Ancil Boozer 4. Allergies Rhinitis this is under control we will continue with supportive care 5. Morbid obesity she honestly needs  to work on diet and manage her weight.  She is not able to do much in the way of exercise.  General Counseling: I have discussed the findings of the evaluation and examination with Bret.  I have also discussed any further diagnostic evaluation thatmay be needed or ordered today. Amarra verbalizes understanding of the findings of todays visit. We also reviewed her medications today and discussed drug interactions and side effects including but not limited excessive drowsiness and altered mental states. We also discussed that there is always a risk not just to her but also people around her. she has been encouraged to call the office with any questions or concerns that should arise related to todays visit.    Time spent: 50 minutes  I have personally obtained a history, examined the patient, evaluated laboratory and imaging results, formulated the assessment and plan and placed orders.    Allyne Gee, MD [facility] Sleep medicine

## 2019-08-24 NOTE — Progress Notes (Signed)
Name: Meagan [name]   MRN: [id number]    DOB: [birth year]   Date:08/24/2019       Progress Note  Subjective  Chief Complaint  Chief Complaint  Patient presents with  . Follow-up    1 month F/U  . Diabetes    HPI   S/p CVA: she developed right occipital headaches and bp was spiking . She went to eye doctor and bp was over 200's , he called me and we advised to send her to [facility], she arrived to [facility] with her daughter. CTA showed 45 % stenosis proximal right internal carotid artery , 60 % stenosis right external carotid artery , left distal common external carotid was 30 % , echo showed normal EF, LDL was very high at 199 and during visit it was changed from Crestor to Lipitor 80. She is also on Plavix 75 mg and aspirin 325 mg . No side effects of medication. She initially lost vision bilaterally and has some dysarthria, but vision is unchanged since last visit with me , she does not have  peripheral vision on left side. Using a walker to assist with ambulation. She is still going to PT at [facility] and speech pattern is normal Still unable to drive   DMII: she has a long history of not being compliant with her medication, she is living with her sister - [name] - she has been doing very well  She is on Ozempic, Levemir, and Metformin. She states difficulty taking Metformin ER BID . Glucose fasting has been low 100's, usually 115  She brought the sugar log, also meal logs and she seems to be eating a lot of bread and watermelon, but since glucose is at goal we will not worry about it as much now  HTN: bp at home has been mostly at goal 120's-130's/ 70's-80's,also at goal today. No chest pain, palpitation or SOB. She is wearing a holter monitor today  OSA: seen by Dr. Humphrey Rolls and will have a sleep study, but advised to have it after she removed holter monitor, it will be removed Dec 2020    Patient Active Problem List   Diagnosis Date Noted  . History of CVA with residual deficit  07/01/2019  . Leukocytosis 07/01/2019  . Polycythemia 07/01/2019  . Morbid obesity ([place]) 10/22/2018  . Non compliance with medical treatment 03/26/2018  . Dyslipidemia associated with type 2 diabetes mellitus ([place]) 12/04/2016  . Uncontrolled type 2 diabetes mellitus with microalbuminuria, without long-term current use of insulin ([place]) 12/04/2016  . Seasonal allergies 02/01/2016  . OSA (obstructive sleep apnea) 10/06/2015  . Hypertensive retinopathy 10/06/2015  . Thyroid cyst 10/06/2015  . Dyslipidemia 10/06/2015  . Type 2 diabetes, uncontrolled, with neuropathy ([place]) 10/06/2015  . History of pneumonia 10/06/2015  . Chronic neck pain 10/06/2015  . Occlusion and stenosis of carotid artery without mention of cerebral infarction   . Hypertension, benign     Past Surgical History:  Procedure Laterality Date  . APPENDECTOMY  2000s  . BIOPSY THYROID  2006   [facility]  . BREAST EXCISIONAL BIOPSY Left 2000s   neg  . CARPAL TUNNEL RELEASE Left March 2015  . COLONOSCOPY  2011   DR.WOHL  . COLONOSCOPY WITH PROPOFOL N/A 07/11/2017   Procedure: COLONOSCOPY WITH PROPOFOL;  Surgeon: Jonathon Bellows, MD;  Location: [facility];  Service: Gastroenterology;  Laterality: N/A;  . ganglion cyst removal     . KNEE SURGERY Left 08/2009  . KNEE SURGERY Right 04/07/2010  .  ROTATOR CUFF REPAIR Right 2008  . TUBAL LIGATION      Family History  Problem Relation Age of Onset  . Breast cancer Maternal Aunt   . Hypertension Mother   . Kidney Stones Mother   . Hypertension Father   . Congestive Heart Failure Father   . Hypertension Paternal Grandfather   . Diabetes Sister   . Vision loss Sister   . Varicose Veins Sister   . Hypertension Sister   . Breast cancer Sister 19       Double Mastectomy  . Hypertension Sister     Social History   Socioeconomic History  . Marital status: Widowed    Spouse name: Not on file  . Number of children: 4  . Years of education: Not on file  . Highest education  level: Master's degree (e.g., MA, MS, MEng, MEd, MSW, MBA)  Occupational History  . Occupation: Higher education careers adviser   Social Needs  . Financial resource strain: Not hard at all  . Food insecurity    Worry: Never true    Inability: Never true  . Transportation needs    Medical: No    Non-medical: No  Tobacco Use  . Smoking status: Never Smoker  . Smokeless tobacco: Never Used  Substance and Sexual Activity  . Alcohol use: No  . Drug use: No  . Sexual activity: Not Currently    Partners: Male    Comment: Widow for 14 years  Lifestyle  . Physical activity    Days per week: 0 days    Minutes per session: 0 min  . Stress: Not at all  Relationships  . Social connections    Talks on phone: More than three times a week    Gets together: More than three times a week    Attends religious service: More than 4 times per year    Active member of club or organization: Yes    Attends meetings of clubs or organizations: More than 4 times per year    Relationship status: Widowed  . Intimate partner violence    Fear of current or ex partner: No    Emotionally abused: No    Physically abused: No    Forced sexual activity: No  Other Topics Concern  . Not on file  Social History Narrative   She had a stroke on 06/2019, she has left peripheral vision loss since stroke   She moved in with her youngest sister      Current Outpatient Medications:  .  amLODipine (NORVASC) 5 MG tablet, Take 1 tablet (5 mg total) by mouth daily., Disp: 90 tablet, Rfl: 1 .  aspirin 325 MG tablet, Take 1 tablet (325 mg total) by mouth daily. Take for 3 months only., Disp: , Rfl:  .  atorvastatin (LIPITOR) 80 MG tablet, Take 1 tablet (80 mg total) by mouth daily at 6 PM., Disp: 90 tablet, Rfl: 1 .  blood glucose meter kit and supplies, Dispense based on patient and insurance preference. Use up to four times daily as directed. (FOR ICD-10 E10.9, E11.9)., Disp: 1 each, Rfl: 0 .  clopidogrel (PLAVIX) 75 MG tablet, Take 1  tablet (75 mg total) by mouth daily., Disp: 90 tablet, Rfl: 1 .  hydrALAZINE (APRESOLINE) 10 MG tablet, Take 1 tablet (10 mg total) by mouth 3 (three) times daily as needed. If bp goes above 150/90, Disp: 30 tablet, Rfl: 0 .  Insulin Detemir (LEVEMIR FLEXTOUCH) 100 UNIT/ML Pen, Inject 25-50 Units into the skin  daily., Disp: 15 mL, Rfl: 2 .  Insulin Pen Needle 32G X 4 MM MISC, 1 each by Does not apply route daily., Disp: 100 each, Rfl: 11 .  levocetirizine (XYZAL) 5 MG tablet, Take 5 mg by mouth as needed for allergies., Disp: , Rfl:  .  metFORMIN (GLUCOPHAGE-XR) 750 MG 24 hr tablet, Take 1 tablet (750 mg total) by mouth 2 (two) times daily., Disp: 180 tablet, Rfl: 1 .  olmesartan-hydrochlorothiazide (BENICAR HCT) 40-25 MG tablet, Take 1 tablet by mouth daily., Disp: 90 tablet, Rfl: 0 .  polyethylene glycol (MIRALAX / GLYCOLAX) 17 g packet, Take 17 g by mouth daily., Disp: 14 each, Rfl: 0 .  Semaglutide, 1 MG/DOSE, (OZEMPIC, 1 MG/DOSE,) 2 MG/1.5ML SOPN, Inject 1 mg into the skin every 7 (seven) days., Disp: 9 mL, Rfl: 2 .  traMADol (ULTRAM) 50 MG tablet, Take 1 tablet (50 mg total) by mouth every 6 (six) hours as needed for moderate pain., Disp: 10 tablet, Rfl: 0  Allergies  Allergen Reactions  . [place] [Dapagliflozin] Rash    Pt reports that she developed a rash when she took [place] and she would like it to be added as an allergy  . Keflex [Cephalexin] Rash    I personally reviewed active problem list, medication list, allergies, family history, social history, health maintenance with the patient/caregiver today.   ROS  Constitutional: Negative for fever or weight change.  Respiratory: Negative for cough and shortness of breath.   Cardiovascular: Negative for chest pain or palpitations.  Gastrointestinal: Negative for abdominal pain, no bowel changes.  Musculoskeletal: Negative for gait problem or joint swelling.  Skin: Negative for rash.  Neurological: Negative for dizziness or  headache.  No other specific complaints in a complete review of systems (except as listed in HPI above).  Objective  Vitals:   08/24/19 1352  BP: 130/80  Pulse: (!) 106  Resp: 16  Temp: (!) 96.9 F (36.1 C)  TempSrc: Temporal  SpO2: 96%  Weight: 193 lb 14.4 oz (88 kg)  Height: '5\' 4"'  (1.626 m)    Body mass index is 33.28 kg/m.  Physical Exam  Constitutional: Patient appears well-developed and well-nourished. Obese No distress.  HEENT: head atraumatic, normocephalic, pupils equal and reactive to light Cardiovascular: Normal rate, regular rhythm and normal heart sounds.  No murmur heard. No BLE edema. Pulmonary/Chest: Effort normal and breath sounds normal. No respiratory distress. Abdominal: Soft.  There is no tenderness. Pelvic:normal exam, pap smear collected  Muscular skeletal: using a walker Psychiatric: Patient has a normal mood and affect. behavior is normal. Judgment and thought content normal.  Recent Results (from the past 2160 hour(s))  HM DIABETES EYE EXAM     Status: Abnormal   Collection Time: 05/27/19 12:00 AM  Result Value Ref Range   HM Diabetic Eye Exam Retinopathy (A) No Retinopathy    Comment: Dr. Genia Hotter non-proliferative diabetic retinopa  Lipase, blood     Status: None   Collection Time: 07/01/19 12:04 PM  Result Value Ref Range   Lipase 33 11 - 51 U/L    Comment: Performed at [facility], [place] [address], [place], [place] [address]  Comprehensive metabolic panel     Status: Abnormal   Collection Time: 07/01/19 12:04 PM  Result Value Ref Range   Sodium 132 (L) 135 - 145 mmol/L   Potassium 3.9 3.5 - 5.1 mmol/L   Chloride 93 (L) 98 - 111 mmol/L   CO2 25 22 - 32 mmol/L  Glucose, Bld 316 (H) 70 - 99 mg/dL   BUN 11 8 - 23 mg/dL   Creatinine, Ser 0.82 0.44 - 1.00 mg/dL   Calcium 9.0 8.9 - 10.3 mg/dL   Total Protein 7.7 6.5 - 8.1 g/dL   Albumin 3.1 (L) 3.5 - 5.0 g/dL   AST 17 15 - 41 U/L   ALT 18 0 - 44 U/L   Alkaline  Phosphatase 126 38 - 126 U/L   Total Bilirubin 0.7 0.3 - 1.2 mg/dL   GFR calc non Af Amer >60 >60 mL/min   GFR calc Af Amer >60 >60 mL/min   Anion gap 14 5 - 15    Comment: Performed at [facility] [address], [place], [place] [address]  CBC     Status: Abnormal   Collection Time: 07/01/19 12:04 PM  Result Value Ref Range   WBC 11.9 (H) 4.0 - 10.5 K/uL   RBC 5.40 (H) 3.87 - 5.11 MIL/uL   Hemoglobin 16.5 (H) 12.0 - 15.0 g/dL   HCT 46.8 (H) 36.0 - 46.0 %   MCV 86.7 80.0 - 100.0 fL   MCH 30.6 26.0 - 34.0 pg   MCHC 35.3 30.0 - 36.0 g/dL   RDW 12.1 11.5 - 15.5 %   Platelets 297 150 - 400 K/uL   nRBC 0.0 0.0 - 0.2 %    Comment: Performed at [facility], [place] [address], [place], [place] [address]  Sedimentation rate     Status: Abnormal   Collection Time: 07/01/19  2:30 PM  Result Value Ref Range   Sed Rate 55 (H) 0 - 22 mm/hr    Comment: Performed at [facility] [address] [place], [place] [address]  C-reactive protein     Status: Abnormal   Collection Time: 07/01/19  2:30 PM  Result Value Ref Range   CRP 1.2 (H) <1.0 mg/dL    Comment: Performed at [facility], [place] [address], [place], [place] [address]  Urinalysis, Routine w reflex microscopic     Status: Abnormal   Collection Time: 07/01/19  3:08 PM  Result Value Ref Range   Color, Urine YELLOW YELLOW   APPearance CLEAR CLEAR   Specific Gravity, Urine 1.008 1.005 - 1.030   pH 6.0 5.0 - 8.0   Glucose, UA >=500 (A) NEGATIVE mg/dL   Hgb urine dipstick NEGATIVE NEGATIVE   Bilirubin Urine NEGATIVE NEGATIVE   Ketones, ur NEGATIVE NEGATIVE mg/dL   Protein, ur 100 (A) NEGATIVE mg/dL   Nitrite NEGATIVE NEGATIVE   Leukocytes,Ua NEGATIVE NEGATIVE   RBC / HPF 0-5 0 - 5 RBC/hpf   WBC, UA 0-5 0 - 5 WBC/hpf   Bacteria, UA RARE (A) NONE SEEN   Squamous Epithelial / LPF 0-5 0 - 5   Hyaline Casts, UA PRESENT     Comment: Performed at [facility], [address], [place], [place] [address]   SARS CORONAVIRUS 2 (TAT 6-24 HRS) Nasopharyngeal Nasopharyngeal Swab     Status: None   Collection Time: 07/01/19  6:59 PM   Specimen: Nasopharyngeal Swab  Result Value Ref Range   SARS Coronavirus 2 NEGATIVE NEGATIVE    Comment: (NOTE) SARS-CoV-2 target nucleic acids are NOT DETECTED. The SARS-CoV-2 RNA is generally detectable in upper and lower respiratory specimens during the acute phase of infection. Negative results do not preclude SARS-CoV-2 infection, do not rule out co-infections with other pathogens, and should not be used as the sole  basis for treatment or other patient management decisions. Negative results must be combined with clinical observations, patient history, and epidemiological information. The expected result is Negative. Fact Sheet for Patients: SugarRoll.be Fact Sheet for Healthcare Providers: https://www.woods-mathews.com/ This test is not yet approved or cleared by the [place] FDA and  has been authorized for detection and/or diagnosis of SARS-CoV-2 by FDA under an Emergency Use Authorization (EUA). This EUA will remain  in effect (meaning this test can be used) for the duration of the COVID-19 declaration under Section 56 4(b)(1) of the Act, 21 U.S.C. section 360bbb-3(b)(1), unless the authorization is terminated or revoked sooner. Performed at [facility], [place] [address], [place], [place] [address]   CBG monitoring, ED     Status: Abnormal   Collection Time: 07/01/19 11:21 PM  Result Value Ref Range   Glucose-Capillary 327 (H) 70 - 99 mg/dL  HIV Antibody  (Routine Testing)     Status: None   Collection Time: 07/02/19  4:11 AM  Result Value Ref Range   HIV Screen 4th Generation wRfx Non Reactive Non Reactive    Comment: (NOTE) Performed At: [facility] [place], [place] [id number] Rush Farmer MD [id number]   Hemoglobin A1c     Status: Abnormal   Collection Time:  07/02/19  4:11 AM  Result Value Ref Range   Hgb A1c MFr Bld 10.1 (H) 4.8 - 5.6 %    Comment: (NOTE) Pre diabetes:          5.7%-6.4% Diabetes:              >6.4% Glycemic control for   <7.0% adults with diabetes    Mean Plasma Glucose 243.17 mg/dL    Comment: Performed at [facility] [address] [place], [place] [address]  Lipid panel     Status: Abnormal   Collection Time: 07/02/19  4:11 AM  Result Value Ref Range   Cholesterol 267 (H) 0 - 200 mg/dL   Triglycerides 182 (H) <150 mg/dL   HDL 32 (L) >40 mg/dL   Total CHOL/HDL Ratio 8.3 RATIO   VLDL 36 0 - 40 mg/dL   LDL Cholesterol 199 (H) 0 - 99 mg/dL    Comment:        Total Cholesterol/HDL:CHD Risk Coronary Heart Disease Risk Table                     Men   Women  1/2 Average Risk   3.4   3.3  Average Risk       5.0   4.4  2 X Average Risk   9.6   7.1  3 X Average Risk  23.4   11.0        Use the calculated Patient Ratio above and the CHD Risk Table to determine the patient's CHD Risk.        ATP III CLASSIFICATION (LDL):  <100     mg/dL   Optimal  100-129  mg/dL   Near or Above                    Optimal  130-159  mg/dL   Borderline  160-189  mg/dL   High  >190     mg/dL   Very High Performed at [facility] [address] [place], [place] [address]   CBC     Status: Abnormal   Collection Time: 07/02/19  4:11 AM  Result Value  Ref Range   WBC 11.1 (H) 4.0 - 10.5 K/uL   RBC 5.41 (H) 3.87 - 5.11 MIL/uL   Hemoglobin 16.2 (H) 12.0 - 15.0 g/dL   HCT 47.4 (H) 36.0 - 46.0 %   MCV 87.6 80.0 - 100.0 fL   MCH 29.9 26.0 - 34.0 pg   MCHC 34.2 30.0 - 36.0 g/dL   RDW 12.0 11.5 - 15.5 %   Platelets 329 150 - 400 K/uL   nRBC 0.0 0.0 - 0.2 %    Comment: Performed at [facility] [address], [place], [place] [address]  CBG monitoring, ED     Status: Abnormal   Collection Time: 07/02/19  8:18 AM  Result Value Ref Range   Glucose-Capillary 284 (H) 70 - 99 mg/dL  Rapid urine drug screen (hospital  performed)     Status: None   Collection Time: 07/02/19  8:28 AM  Result Value Ref Range   Opiates NONE DETECTED NONE DETECTED   Cocaine NONE DETECTED NONE DETECTED   Benzodiazepines NONE DETECTED NONE DETECTED   Amphetamines NONE DETECTED NONE DETECTED   Tetrahydrocannabinol NONE DETECTED NONE DETECTED   Barbiturates NONE DETECTED NONE DETECTED    Comment: (NOTE) DRUG SCREEN FOR MEDICAL PURPOSES ONLY.  IF CONFIRMATION IS NEEDED FOR ANY PURPOSE, NOTIFY LAB WITHIN 5 DAYS. LOWEST DETECTABLE LIMITS FOR URINE DRUG SCREEN Drug Class                     Cutoff (ng/mL) Amphetamine and metabolites    1000 Barbiturate and metabolites    200 Benzodiazepine                 892 Tricyclics and metabolites     300 Opiates and metabolites        300 Cocaine and metabolites        300 THC                            50 Performed at [facility], [place] [address], [place], [place] [address]   ECHOCARDIOGRAM COMPLETE     Status: None   Collection Time: 07/02/19 12:15 PM  Result Value Ref Range   BP 143/67 mmHg  CBG monitoring, ED     Status: Abnormal   Collection Time: 07/02/19 12:38 PM  Result Value Ref Range   Glucose-Capillary 226 (H) 70 - 99 mg/dL   Comment 1 Notify RN    Comment 2 Document in Chart   Glucose, capillary     Status: Abnormal   Collection Time: 07/02/19  5:05 PM  Result Value Ref Range   Glucose-Capillary 266 (H) 70 - 99 mg/dL  Glucose, capillary     Status: Abnormal   Collection Time: 07/02/19  8:23 PM  Result Value Ref Range   Glucose-Capillary 227 (H) 70 - 99 mg/dL  Glucose, capillary     Status: Abnormal   Collection Time: 07/02/19 11:34 PM  Result Value Ref Range   Glucose-Capillary 179 (H) 70 - 99 mg/dL   Comment 1 Notify RN   CBC     Status: None   Collection Time: 07/03/19  3:18 AM  Result Value Ref Range   WBC 9.5 4.0 - 10.5 K/uL   RBC 4.90 3.87 - 5.11 MIL/uL   Hemoglobin 14.6 12.0 - 15.0 g/dL   HCT 43.0 36.0 - 46.0 %   MCV 87.8 80.0 - 100.0 fL    MCH  29.8 26.0 - 34.0 pg   MCHC 34.0 30.0 - 36.0 g/dL   RDW 12.2 11.5 - 15.5 %   Platelets 286 150 - 400 K/uL   nRBC 0.0 0.0 - 0.2 %    Comment: Performed at [facility], [place] [address] [place], [place] [address]  Basic metabolic panel     Status: Abnormal   Collection Time: 07/03/19  3:18 AM  Result Value Ref Range   Sodium 138 135 - 145 mmol/L   Potassium 2.9 (L) 3.5 - 5.1 mmol/L   Chloride 100 98 - 111 mmol/L   CO2 28 22 - 32 mmol/L   Glucose, Bld 136 (H) 70 - 99 mg/dL   BUN 14 8 - 23 mg/dL   Creatinine, Ser 0.80 0.44 - 1.00 mg/dL   Calcium 8.6 (L) 8.9 - 10.3 mg/dL   GFR calc non Af Amer >60 >60 mL/min   GFR calc Af Amer >60 >60 mL/min   Anion gap 10 5 - 15    Comment: Performed at [facility], [place] [address] [place], [place] [address]  Glucose, capillary     Status: Abnormal   Collection Time: 07/03/19  6:54 AM  Result Value Ref Range   Glucose-Capillary 156 (H) 70 - 99 mg/dL   Comment 1 Notify RN   Glucose, capillary     Status: Abnormal   Collection Time: 07/03/19 12:33 PM  Result Value Ref Range   Glucose-Capillary 224 (H) 70 - 99 mg/dL  Glucose, capillary     Status: Abnormal   Collection Time: 07/03/19  5:10 PM  Result Value Ref Range   Glucose-Capillary 225 (H) 70 - 99 mg/dL  Glucose, capillary     Status: Abnormal   Collection Time: 07/03/19  8:55 PM  Result Value Ref Range   Glucose-Capillary 268 (H) 70 - 99 mg/dL  Glucose, capillary     Status: Abnormal   Collection Time: 07/04/19  6:18 AM  Result Value Ref Range   Glucose-Capillary 184 (H) 70 - 99 mg/dL   Comment 1 Notify RN   Basic metabolic panel     Status: Abnormal   Collection Time: 07/04/19  7:48 AM  Result Value Ref Range   Sodium 138 135 - 145 mmol/L   Potassium 4.3 3.5 - 5.1 mmol/L   Chloride 103 98 - 111 mmol/L   CO2 28 22 - 32 mmol/L   Glucose, Bld 223 (H) 70 - 99 mg/dL   BUN 13 8 - 23 mg/dL   Creatinine, Ser 0.79 0.44 - 1.00 mg/dL   Calcium 8.7 (L) 8.9 - 10.3 mg/dL   GFR  calc non Af Amer >60 >60 mL/min   GFR calc Af Amer >60 >60 mL/min   Anion gap 7 5 - 15    Comment: Performed at [facility], [place] [address] [place], [place] [address]  Glucose, capillary     Status: Abnormal   Collection Time: 07/04/19 12:12 PM  Result Value Ref Range   Glucose-Capillary 279 (H) 70 - 99 mg/dL  Glucose, capillary     Status: Abnormal   Collection Time: 07/04/19  4:52 PM  Result Value Ref Range   Glucose-Capillary 200 (H) 70 - 99 mg/dL  Glucose, capillary     Status: Abnormal   Collection Time: 07/04/19  9:26 PM  Result Value Ref Range   Glucose-Capillary 228 (H) 70 - 99 mg/dL   Comment 1 Notify RN    Comment 2 Document in Chart  Glucose, capillary     Status: Abnormal   Collection Time: 07/05/19  6:10 AM  Result Value Ref Range   Glucose-Capillary 126 (H) 70 - 99 mg/dL   Comment 1 Notify RN    Comment 2 Document in Chart   Glucose, capillary     Status: Abnormal   Collection Time: 07/05/19 11:36 AM  Result Value Ref Range   Glucose-Capillary 217 (H) 70 - 99 mg/dL   Comment 1 Notify RN    Comment 2 Document in Chart   Glucose, capillary     Status: Abnormal   Collection Time: 07/05/19  4:18 PM  Result Value Ref Range   Glucose-Capillary 183 (H) 70 - 99 mg/dL  Glucose, capillary     Status: Abnormal   Collection Time: 07/05/19  9:40 PM  Result Value Ref Range   Glucose-Capillary 226 (H) 70 - 99 mg/dL  Basic metabolic panel     Status: Abnormal   Collection Time: 07/06/19  4:12 AM  Result Value Ref Range   Sodium 138 135 - 145 mmol/L   Potassium 3.3 (L) 3.5 - 5.1 mmol/L   Chloride 99 98 - 111 mmol/L   CO2 30 22 - 32 mmol/L   Glucose, Bld 138 (H) 70 - 99 mg/dL   BUN 10 8 - 23 mg/dL   Creatinine, Ser 0.81 0.44 - 1.00 mg/dL   Calcium 8.7 (L) 8.9 - 10.3 mg/dL   GFR calc non Af Amer >60 >60 mL/min   GFR calc Af Amer >60 >60 mL/min   Anion gap 9 5 - 15    Comment: Performed at [facility], [place] [address], [place], [place] [address]  CBC      Status: None   Collection Time: 07/06/19  4:12 AM  Result Value Ref Range   WBC 10.2 4.0 - 10.5 K/uL   RBC 4.41 3.87 - 5.11 MIL/uL   Hemoglobin 13.7 12.0 - 15.0 g/dL   HCT 38.4 36.0 - 46.0 %   MCV 87.1 80.0 - 100.0 fL   MCH 31.1 26.0 - 34.0 pg   MCHC 35.7 30.0 - 36.0 g/dL   RDW 12.1 11.5 - 15.5 %   Platelets 245 150 - 400 K/uL   nRBC 0.0 0.0 - 0.2 %    Comment: Performed at [facility], [place] [address] [place], [place] [address]  Glucose, capillary     Status: Abnormal   Collection Time: 07/06/19  6:07 AM  Result Value Ref Range   Glucose-Capillary 120 (H) 70 - 99 mg/dL  Glucose, capillary     Status: Abnormal   Collection Time: 07/06/19  9:43 AM  Result Value Ref Range   Glucose-Capillary 135 (H) 70 - 99 mg/dL  Glucose, capillary     Status: Abnormal   Collection Time: 07/06/19 12:19 PM  Result Value Ref Range   Glucose-Capillary 276 (H) 70 - 99 mg/dL   Comment 1 Notify RN    Comment 2 Document in Chart   Novel Coronavirus, NAA (Hosp order, Send-out to Ref Lab; TAT 18-24 hrs     Status: None   Collection Time: 07/06/19  2:45 PM   Specimen: Nasopharyngeal Swab; Respiratory  Result Value Ref Range   SARS-CoV-2, NAA NOT DETECTED NOT DETECTED    Comment: (NOTE) This nucleic acid amplification test was developed and its performance characteristics determined by [employer]. Nucleic acid amplification tests include PCR and TMA. This test has not been FDA cleared or approved. This test has  been authorized by FDA under an Emergency Use Authorization (EUA). This test is only authorized for the duration of time the declaration that circumstances exist justifying the authorization of the emergency use of in vitro diagnostic tests for detection of SARS-CoV-2 virus and/or diagnosis of COVID-19 infection under section 564(b)(1) of the Act, 21 U.S.C. 315VVO-1(Y) (1), unless the authorization is terminated or revoked sooner. When diagnostic testing is negative, the  possibility of a false negative result should be considered in the context of a patient's recent exposures and the presence of clinical signs and symptoms consistent with COVID-19. An individual without symptoms of COVID- 19 and who is not shedding SARS-CoV-2 vi rus would expect to have a negative (not detected) result in this assay. Performed At: [facility] [place], [place] [id number] Rush Farmer MD [id number]    Coronavirus Source NASOPHARYNGEAL     Comment: Performed at [facility], [place] [address] [place], [place] [address]  Glucose, capillary     Status: Abnormal   Collection Time: 07/06/19  3:49 PM  Result Value Ref Range   Glucose-Capillary 265 (H) 70 - 99 mg/dL  Glucose, capillary     Status: Abnormal   Collection Time: 07/06/19  9:38 PM  Result Value Ref Range   Glucose-Capillary 141 (H) 70 - 99 mg/dL  Basic metabolic panel     Status: Abnormal   Collection Time: 07/07/19  3:58 AM  Result Value Ref Range   Sodium 137 135 - 145 mmol/L   Potassium 3.3 (L) 3.5 - 5.1 mmol/L   Chloride 100 98 - 111 mmol/L   CO2 29 22 - 32 mmol/L   Glucose, Bld 161 (H) 70 - 99 mg/dL   BUN 11 8 - 23 mg/dL   Creatinine, Ser 0.76 0.44 - 1.00 mg/dL   Calcium 8.5 (L) 8.9 - 10.3 mg/dL   GFR calc non Af Amer >60 >60 mL/min   GFR calc Af Amer >60 >60 mL/min   Anion gap 8 5 - 15    Comment: Performed at [facility], [place] [address], [place], [place] [address]  Glucose, capillary     Status: Abnormal   Collection Time: 07/07/19  6:28 AM  Result Value Ref Range   Glucose-Capillary 126 (H) 70 - 99 mg/dL  Glucose, capillary     Status: Abnormal   Collection Time: 07/07/19 11:29 AM  Result Value Ref Range   Glucose-Capillary 205 (H) 70 - 99 mg/dL   Comment 1 Notify RN    Comment 2 Document in Chart   Glucose, capillary     Status: Abnormal   Collection Time: 07/07/19  3:47 PM  Result Value Ref Range   Glucose-Capillary 147 (H) 70 - 99 mg/dL   Comment  1 Notify RN    Comment 2 Document in Chart   Glucose, capillary     Status: Abnormal   Collection Time: 07/07/19  9:02 PM  Result Value Ref Range   Glucose-Capillary 207 (H) 70 - 99 mg/dL  Basic metabolic panel     Status: Abnormal   Collection Time: 07/08/19  3:33 AM  Result Value Ref Range   Sodium 137 135 - 145 mmol/L   Potassium 3.5 3.5 - 5.1 mmol/L   Chloride 99 98 - 111 mmol/L   CO2 28 22 - 32 mmol/L   Glucose, Bld 131 (H) 70 - 99 mg/dL   BUN 10 8 - 23 mg/dL   Creatinine, Ser 0.77 0.44 - 1.00 mg/dL  Calcium 8.7 (L) 8.9 - 10.3 mg/dL   GFR calc non Af Amer >60 >60 mL/min   GFR calc Af Amer >60 >60 mL/min   Anion gap 10 5 - 15    Comment: Performed at [facility] [address] [place], [place] [address]  Glucose, capillary     Status: Abnormal   Collection Time: 07/08/19  6:36 AM  Result Value Ref Range   Glucose-Capillary 120 (H) 70 - 99 mg/dL  Glucose, capillary     Status: Abnormal   Collection Time: 07/08/19 11:39 AM  Result Value Ref Range   Glucose-Capillary 219 (H) 70 - 99 mg/dL  Glucose, capillary     Status: Abnormal   Collection Time: 07/08/19  4:15 PM  Result Value Ref Range   Glucose-Capillary 155 (H) 70 - 99 mg/dL  COMPLETE METABOLIC PANEL WITH GFR     Status: Abnormal   Collection Time: 07/24/19 12:00 AM  Result Value Ref Range   Glucose, Bld 177 (H) 65 - 99 mg/dL    Comment: .            Fasting reference interval . For someone without known diabetes, a glucose value >125 mg/dL indicates that they may have diabetes and this should be confirmed with a follow-up test. .    BUN 15 7 - 25 mg/dL   Creat 0.85 0.50 - 0.99 mg/dL    Comment: For patients >76 years of age, the reference limit for Creatinine is approximately 13% higher for people identified as African-American. .    GFR, Est Non African American 71 > OR = 60 mL/min/1.3m   GFR, Est African American 83 > OR = 60 mL/min/1.763m  BUN/Creatinine Ratio NOT APPLICABLE 6 - 22 (calc)    Sodium 140 135 - 146 mmol/L   Potassium 3.5 3.5 - 5.3 mmol/L   Chloride 100 98 - 110 mmol/L   CO2 28 20 - 32 mmol/L   Calcium 9.3 8.6 - 10.4 mg/dL   Total Protein 6.8 6.1 - 8.1 g/dL   Albumin 3.7 3.6 - 5.1 g/dL   Globulin 3.1 1.9 - 3.7 g/dL (calc)   AG Ratio 1.2 1.0 - 2.5 (calc)   Total Bilirubin 0.4 0.2 - 1.2 mg/dL   Alkaline phosphatase (APISO) 114 37 - 153 U/L   AST 14 10 - 35 U/L   ALT 14 6 - 29 U/L     PHQ2/9: Depression screen [facility] 08/24/2019 08/24/2019 07/24/2019 01/28/2019 03/26/2018  Decreased Interest 0 0 0 0 0  Down, Depressed, Hopeless 0 0 0 0 0  PHQ - 2 Score 0 0 0 0 0  Altered sleeping 0 - 0 0 0  Tired, decreased energy 0 - 0 0 1  Change in appetite 0 - 0 0 0  Feeling bad or failure about yourself  0 - 0 0 0  Trouble concentrating 0 - 0 0 0  Moving slowly or fidgety/restless 0 - 0 0 0  Suicidal thoughts 0 - 0 0 0  PHQ-9 Score 0 - 0 0 1  Difficult doing work/chores - - - Not difficult at all Not difficult at all    phq 9 is negative   Fall Risk: Fall Risk  08/24/2019 07/24/2019 02/12/2019 01/28/2019 10/28/2018  Falls in the past year? 0 0 0 0 0  Number falls in past yr: - 0 - 0 0  Injury with Fall? - 0 - 0 0    Functional Status Survey: Is the patient  deaf or have difficulty hearing?: No Does the patient have difficulty seeing, even when wearing glasses/contacts?: Yes Does the patient have difficulty concentrating, remembering, or making decisions?: No Does the patient have difficulty walking or climbing stairs?: Yes Does the patient have difficulty dressing or bathing?: No Does the patient have difficulty doing errands alone such as visiting a doctor's office or shopping?: Yes   Assessment & Plan  1. History of CVA with residual deficit  Visual loss, we will continue to monitor, taking medications as prescribed  2. Hypertension, benign  At goal today  3. OSA (obstructive sleep apnea)  Discussed sleep study and compliance again   4.  Dyslipidemia associated with type 2 diabetes mellitus ([place])  Doing much better with compliance, living with sister  5. Cervical cancer screening  - Cytology - PAP

## 2019-08-24 NOTE — Patient Instructions (Signed)

## 2019-08-27 LAB — CYTOLOGY - PAP
Diagnosis: NEGATIVE
Diagnosis: REACTIVE
High risk HPV: NEGATIVE

## 2019-09-14 ENCOUNTER — Telehealth: Payer: Self-pay | Admitting: *Deleted

## 2019-09-14 ENCOUNTER — Telehealth: Payer: Self-pay

## 2019-09-14 NOTE — Telephone Encounter (Signed)
lmom Confirmed patient appt for 09/16/19. beth °

## 2019-09-14 NOTE — Telephone Encounter (Signed)
Received form for Critical Ilness. Pt seen for stroke in [facility] 07-01-19.  JM/NP saw 08-06-19 stroke f/u.  Completed, sent for review and signature.

## 2019-09-15 NOTE — Telephone Encounter (Signed)
Signed.  To MR.  

## 2019-09-15 NOTE — Telephone Encounter (Signed)
Completed and placed in out box.  Thank you.

## 2019-09-16 ENCOUNTER — Other Ambulatory Visit: Payer: Self-pay

## 2019-09-16 ENCOUNTER — Ambulatory Visit: Payer: BC Managed Care – PPO | Admitting: Internal Medicine

## 2019-09-16 DIAGNOSIS — G4733 Obstructive sleep apnea (adult) (pediatric): Secondary | ICD-10-CM | POA: Diagnosis not present

## 2019-09-18 ENCOUNTER — Telehealth: Payer: Self-pay

## 2019-09-18 ENCOUNTER — Other Ambulatory Visit: Payer: Self-pay

## 2019-09-18 ENCOUNTER — Encounter: Payer: Self-pay | Admitting: Family Medicine

## 2019-09-18 ENCOUNTER — Ambulatory Visit: Payer: BC Managed Care – PPO | Admitting: Family Medicine

## 2019-09-18 VITALS — BP 120/80 | HR 106 | Temp 96.9°F | Resp 16 | Ht 64.0 in | Wt 188.3 lb

## 2019-09-18 DIAGNOSIS — E1159 Type 2 diabetes mellitus with other circulatory complications: Secondary | ICD-10-CM | POA: Diagnosis not present

## 2019-09-18 DIAGNOSIS — I10 Essential (primary) hypertension: Secondary | ICD-10-CM | POA: Diagnosis not present

## 2019-09-18 DIAGNOSIS — E785 Hyperlipidemia, unspecified: Secondary | ICD-10-CM | POA: Diagnosis not present

## 2019-09-18 DIAGNOSIS — I6930 Unspecified sequelae of cerebral infarction: Secondary | ICD-10-CM

## 2019-09-18 DIAGNOSIS — E1140 Type 2 diabetes mellitus with diabetic neuropathy, unspecified: Secondary | ICD-10-CM

## 2019-09-18 DIAGNOSIS — G4733 Obstructive sleep apnea (adult) (pediatric): Secondary | ICD-10-CM

## 2019-09-18 NOTE — Telephone Encounter (Signed)
Called lmom informing patient of appointment. klh 

## 2019-09-18 NOTE — Telephone Encounter (Signed)
Confirmed appointment with patient. klh °

## 2019-09-18 NOTE — Progress Notes (Signed)
Name: Meagan [name]   MRN: [id number]    DOB: 19-Nov-1950   Date:09/18/2019       Progress Note  Subjective  Chief Complaint  Chief Complaint  Patient presents with  . Form Completion    discuss work    HPI  S/p CVA: date of CVA was 07/01/2019, admitted from 07/01/2019 until 07/08/2019 and had inpatient rehab until  07/17/2019. She developed right occipital headaches and bp was spiking . She went to eye doctor and bp was over 200's , he called me and we advised to send her to [facility], she arrived to [facility] with her daughter. CTA showed 45 % stenosis proximal right internal carotid artery , 60 % stenosis right external carotid artery , left distal common external carotid was 30 % , echo showed normal EF, LDL was very high at 199 and during visit it was changed from Crestor to Lipitor 80. She is also on Plavix 75 mg and aspirin 325 mg . No side effects of medication. She initially lost vision bilaterally and has some dysarthria, but vision is unchanged since last visit with me , she does not have  peripheral vision on left side, still seeing retina sub-specialist and is still unable to drive, using a walker , still has difficulty coming up with her words   DMII: she has a long history of not being compliant with her medication, she is living with her sister - [name] - she has been doing very well  She is on Ozempic, Levemir, and Metformin. She is still eating Metformin ER but not at the same time and is tolerating it easier. Glucose has been well controlled below 120 and above 100   HTN: bp at home has been mostly in the 120's range, sometimes. No chest pain, palpitation or SOB.   OSA: seen by Dr. Humphrey Rolls and will have a sleep study, she will go back on Dec 15 th to find out the results of sleep study   Patient Active Problem List   Diagnosis Date Noted  . History of CVA with residual deficit 07/01/2019  . Leukocytosis 07/01/2019  . Polycythemia 07/01/2019  . Morbid obesity ([place]) 10/22/2018  . Non  compliance with medical treatment 03/26/2018  . Dyslipidemia associated with type 2 diabetes mellitus ([place]) 12/04/2016  . Uncontrolled type 2 diabetes mellitus with microalbuminuria, without long-term current use of insulin ([place]) 12/04/2016  . Seasonal allergies 02/01/2016  . OSA (obstructive sleep apnea) 10/06/2015  . Hypertensive retinopathy 10/06/2015  . Thyroid cyst 10/06/2015  . Dyslipidemia 10/06/2015  . Type 2 diabetes, uncontrolled, with neuropathy ([place]) 10/06/2015  . History of pneumonia 10/06/2015  . Chronic neck pain 10/06/2015  . Occlusion and stenosis of carotid artery without mention of cerebral infarction   . Hypertension, benign     Past Surgical History:  Procedure Laterality Date  . APPENDECTOMY  2000s  . BIOPSY THYROID  2006   [facility]  . BREAST EXCISIONAL BIOPSY Left 2000s   neg  . CARPAL TUNNEL RELEASE Left March 2015  . COLONOSCOPY  2011   DR.WOHL  . COLONOSCOPY WITH PROPOFOL N/A 07/11/2017   Procedure: COLONOSCOPY WITH PROPOFOL;  Surgeon: Jonathon Bellows, MD;  Location: [facility] ENDOSCOPY;  Service: Gastroenterology;  Laterality: N/A;  . ganglion cyst removal     . KNEE SURGERY Left 08/2009  . KNEE SURGERY Right 04/07/2010  . ROTATOR CUFF REPAIR Right 2008  . TUBAL LIGATION      Family History  Problem Relation Age of Onset  .  Breast cancer Maternal Aunt   . Hypertension Mother   . Kidney Stones Mother   . Hypertension Father   . Congestive Heart Failure Father   . Hypertension Paternal Grandfather   . Diabetes Sister   . Vision loss Sister   . Varicose Veins Sister   . Hypertension Sister   . Breast cancer Sister 46       Double Mastectomy  . Hypertension Sister     Social History   Socioeconomic History  . Marital status: Widowed    Spouse name: Not on file  . Number of children: 4  . Years of education: Not on file  . Highest education level: Master's degree (e.g., MA, MS, MEng, MEd, MSW, MBA)  Occupational History  . Occupation: Higher education careers adviser    Tobacco Use  . Smoking status: Never Smoker  . Smokeless tobacco: Never Used  Substance and Sexual Activity  . Alcohol use: No  . Drug use: No  . Sexual activity: Not Currently    Partners: Male    Comment: Widow for 14 years  Other Topics Concern  . Not on file  Social History Narrative   She had a stroke on 06/2019, she has left peripheral vision loss since stroke   She moved in with her youngest sister    Social Determinants of Health   Financial Resource Strain:   . Difficulty of Paying Living Expenses: Not on file  Food Insecurity:   . Worried About Charity fundraiser in the Last Year: Not on file  . Ran Out of Food in the Last Year: Not on file  Transportation Needs:   . Lack of Transportation (Medical): Not on file  . Lack of Transportation (Non-Medical): Not on file  Physical Activity: Inactive  . Days of Exercise per Week: 0 days  . Minutes of Exercise per Session: 0 min  Stress:   . Feeling of Stress : Not on file  Social Connections:   . Frequency of Communication with Friends and Family: Not on file  . Frequency of Social Gatherings with Friends and Family: Not on file  . Attends Religious Services: Not on file  . Active Member of Clubs or Organizations: Not on file  . Attends Archivist Meetings: Not on file  . Marital Status: Not on file  Intimate Partner Violence:   . Fear of Current or Ex-Partner: Not on file  . Emotionally Abused: Not on file  . Physically Abused: Not on file  . Sexually Abused: Not on file     Current Outpatient Medications:  .  amLODipine (NORVASC) 5 MG tablet, Take 1 tablet (5 mg total) by mouth daily., Disp: 90 tablet, Rfl: 1 .  aspirin 325 MG tablet, Take 1 tablet (325 mg total) by mouth daily. Take for 3 months only., Disp: , Rfl:  .  atorvastatin (LIPITOR) 80 MG tablet, Take 1 tablet (80 mg total) by mouth daily at 6 PM., Disp: 90 tablet, Rfl: 1 .  blood glucose meter kit and supplies, Dispense based on patient and  insurance preference. Use up to four times daily as directed. (FOR ICD-10 E10.9, E11.9)., Disp: 1 each, Rfl: 0 .  clopidogrel (PLAVIX) 75 MG tablet, Take 1 tablet (75 mg total) by mouth daily., Disp: 90 tablet, Rfl: 1 .  hydrALAZINE (APRESOLINE) 10 MG tablet, Take 1 tablet (10 mg total) by mouth 3 (three) times daily as needed. If bp goes above 150/90, Disp: 30 tablet, Rfl: 0 .  Insulin Detemir (  LEVEMIR FLEXTOUCH) 100 UNIT/ML Pen, Inject 25-50 Units into the skin daily., Disp: 15 mL, Rfl: 2 .  Insulin Pen Needle 32G X 4 MM MISC, 1 each by Does not apply route daily., Disp: 100 each, Rfl: 11 .  levocetirizine (XYZAL) 5 MG tablet, Take 5 mg by mouth as needed for allergies., Disp: , Rfl:  .  metFORMIN (GLUCOPHAGE-XR) 750 MG 24 hr tablet, Take 1 tablet (750 mg total) by mouth 2 (two) times daily., Disp: 180 tablet, Rfl: 1 .  olmesartan-hydrochlorothiazide (BENICAR HCT) 40-25 MG tablet, Take 1 tablet by mouth daily., Disp: 90 tablet, Rfl: 0 .  ONETOUCH ULTRA test strip, , Disp: , Rfl:  .  Semaglutide, 1 MG/DOSE, (OZEMPIC, 1 MG/DOSE,) 2 MG/1.5ML SOPN, Inject 1 mg into the skin every 7 (seven) days., Disp: 9 mL, Rfl: 2 .  polyethylene glycol (MIRALAX / GLYCOLAX) 17 g packet, Take 17 g by mouth daily. (Patient not taking: Reported on 09/18/2019), Disp: 14 each, Rfl: 0 .  traMADol (ULTRAM) 50 MG tablet, Take 1 tablet (50 mg total) by mouth every 6 (six) hours as needed for moderate pain. (Patient not taking: Reported on 09/18/2019), Disp: 10 tablet, Rfl: 0  Allergies  Allergen Reactions  . Wilder Glade [Dapagliflozin] Rash    Pt reports that she developed a rash when she took [place] and she would like it to be added as an allergy  . Keflex [Cephalexin] Rash    I personally reviewed active problem list, medication list, allergies, family history, social history, health maintenance with the patient/caregiver today.   ROS  Constitutional: Negative for fever , positive for mild  weight change.   Respiratory: Negative for cough and shortness of breath.   Cardiovascular: Negative for chest pain or palpitations.  Gastrointestinal: Negative for abdominal pain, no bowel changes.  Musculoskeletal: Positive for gait problem but no  joint swelling.  Skin: Negative for rash.  Neurological: Negative for dizziness or headache.  No other specific complaints in a complete review of systems (except as listed in HPI above).  Objective  Vitals:   09/18/19 1359  BP: 120/80  Pulse: (!) 106  Resp: 16  Temp: (!) 96.9 F (36.1 C)  TempSrc: Temporal  SpO2: 97%  Weight: 188 lb 4.8 oz (85.4 kg)  Height: 5' 4" (1.626 m)    Body mass index is 32.32 kg/m.  Physical Exam  Constitutional: Patient appears well-developed and well-nourished. Obese No distress.  HEENT: head atraumatic, normocephalic, pupils equal and reactive to light Cardiovascular: Normal rate, regular rhythm and normal heart sounds.  No murmur heard. No BLE edema. Pulmonary/Chest: Effort normal and breath sounds normal. No respiratory distress. Abdominal: Soft.  There is no tenderness. Neurological: dysarthria, weak and needs walker to assist with ambulation, loss of lateral left visual field  Psychiatric: Patient has a normal mood and affect. behavior is normal. Judgment and thought content normal.  Recent Results (from the past 2160 hour(s))  Lipase, blood     Status: None   Collection Time: 07/01/19 12:04 PM  Result Value Ref Range   Lipase 33 11 - 51 U/L    Comment: Performed at [facility] [address], [place], [place] [address]  Comprehensive metabolic panel     Status: Abnormal   Collection Time: 07/01/19 12:04 PM  Result Value Ref Range   Sodium 132 (L) 135 - 145 mmol/L   Potassium 3.9 3.5 - 5.1 mmol/L   Chloride 93 (L) 98 - 111 mmol/L   CO2 25 22 -  32 mmol/L   Glucose, Bld 316 (H) 70 - 99 mg/dL   BUN 11 8 - 23 mg/dL   Creatinine, Ser 0.82 0.44 - 1.00 mg/dL   Calcium 9.0 8.9 - 10.3 mg/dL   Total  Protein 7.7 6.5 - 8.1 g/dL   Albumin 3.1 (L) 3.5 - 5.0 g/dL   AST 17 15 - 41 U/L   ALT 18 0 - 44 U/L   Alkaline Phosphatase 126 38 - 126 U/L   Total Bilirubin 0.7 0.3 - 1.2 mg/dL   GFR calc non Af Amer >60 >60 mL/min   GFR calc Af Amer >60 >60 mL/min   Anion gap 14 5 - 15    Comment: Performed at [facility] [address], [place], [place] [address]  CBC     Status: Abnormal   Collection Time: 07/01/19 12:04 PM  Result Value Ref Range   WBC 11.9 (H) 4.0 - 10.5 K/uL   RBC 5.40 (H) 3.87 - 5.11 MIL/uL   Hemoglobin 16.5 (H) 12.0 - 15.0 g/dL   HCT 46.8 (H) 36.0 - 46.0 %   MCV 86.7 80.0 - 100.0 fL   MCH 30.6 26.0 - 34.0 pg   MCHC 35.3 30.0 - 36.0 g/dL   RDW 12.1 11.5 - 15.5 %   Platelets 297 150 - 400 K/uL   nRBC 0.0 0.0 - 0.2 %    Comment: Performed at [facility], [place] [address], [place], [place] [address]  Sedimentation rate     Status: Abnormal   Collection Time: 07/01/19  2:30 PM  Result Value Ref Range   Sed Rate 55 (H) 0 - 22 mm/hr    Comment: Performed at [facility] [address] [place], [place] [address]  C-reactive protein     Status: Abnormal   Collection Time: 07/01/19  2:30 PM  Result Value Ref Range   CRP 1.2 (H) <1.0 mg/dL    Comment: Performed at [facility], [place] [address] [place], [place] [address]  Urinalysis, Routine w reflex microscopic     Status: Abnormal   Collection Time: 07/01/19  3:08 PM  Result Value Ref Range   Color, Urine YELLOW YELLOW   APPearance CLEAR CLEAR   Specific Gravity, Urine 1.008 1.005 - 1.030   pH 6.0 5.0 - 8.0   Glucose, UA >=500 (A) NEGATIVE mg/dL   Hgb urine dipstick NEGATIVE NEGATIVE   Bilirubin Urine NEGATIVE NEGATIVE   Ketones, ur NEGATIVE NEGATIVE mg/dL   Protein, ur 100 (A) NEGATIVE mg/dL   Nitrite NEGATIVE NEGATIVE   Leukocytes,Ua NEGATIVE NEGATIVE   RBC / HPF 0-5 0 - 5 RBC/hpf   WBC, UA 0-5 0 - 5 WBC/hpf   Bacteria, UA RARE (A) NONE SEEN   Squamous Epithelial / LPF 0-5 0 - 5    Hyaline Casts, UA PRESENT     Comment: Performed at [facility], [address] [place], [place] [address]  SARS CORONAVIRUS 2 (TAT 6-24 HRS) Nasopharyngeal Nasopharyngeal Swab     Status: None   Collection Time: 07/01/19  6:59 PM   Specimen: Nasopharyngeal Swab  Result Value Ref Range   SARS Coronavirus 2 NEGATIVE NEGATIVE    Comment: (NOTE) SARS-CoV-2 target nucleic acids are NOT DETECTED. The SARS-CoV-2 RNA is generally detectable in upper and lower respiratory specimens during the acute phase of infection. Negative results do not preclude SARS-CoV-2 infection, do not rule out co-infections with other pathogens, and should not be  used as the sole basis for treatment or other patient management decisions. Negative results must be combined with clinical observations, patient history, and epidemiological information. The expected result is Negative. Fact Sheet for Patients: SugarRoll.be Fact Sheet for Healthcare Providers: https://www.woods-mathews.com/ This test is not yet approved or cleared by the [place] FDA and  has been authorized for detection and/or diagnosis of SARS-CoV-2 by FDA under an Emergency Use Authorization (EUA). This EUA will remain  in effect (meaning this test can be used) for the duration of the COVID-19 declaration under Section 56 4(b)(1) of the Act, 21 U.S.C. section 360bbb-3(b)(1), unless the authorization is terminated or revoked sooner. Performed at [facility], [place] [address], [place], [place] [address]   CBG monitoring, ED     Status: Abnormal   Collection Time: 07/01/19 11:21 PM  Result Value Ref Range   Glucose-Capillary 327 (H) 70 - 99 mg/dL  HIV Antibody  (Routine Testing)     Status: None   Collection Time: 07/02/19  4:11 AM  Result Value Ref Range   HIV Screen 4th Generation wRfx Non Reactive Non Reactive    Comment: (NOTE) Performed At: [facility] [place], [place] [id number] Rush Farmer MD MK:[id number]   Hemoglobin A1c     Status: Abnormal   Collection Time: 07/02/19  4:11 AM  Result Value Ref Range   Hgb A1c MFr Bld 10.1 (H) 4.8 - 5.6 %    Comment: (NOTE) Pre diabetes:          5.7%-6.4% Diabetes:              >6.4% Glycemic control for   <7.0% adults with diabetes    Mean Plasma Glucose 243.17 mg/dL    Comment: Performed at [facility] [address], [place], [place] [address]  Lipid panel     Status: Abnormal   Collection Time: 07/02/19  4:11 AM  Result Value Ref Range   Cholesterol 267 (H) 0 - 200 mg/dL   Triglycerides 182 (H) <150 mg/dL   HDL 32 (L) >40 mg/dL   Total CHOL/HDL Ratio 8.3 RATIO   VLDL 36 0 - 40 mg/dL   LDL Cholesterol 199 (H) 0 - 99 mg/dL    Comment:        Total Cholesterol/HDL:CHD Risk Coronary Heart Disease Risk Table                     Men   Women  1/2 Average Risk   3.4   3.3  Average Risk       5.0   4.4  2 X Average Risk   9.6   7.1  3 X Average Risk  23.4   11.0        Use the calculated Patient Ratio above and the CHD Risk Table to determine the patient's CHD Risk.        ATP III CLASSIFICATION (LDL):  <100     mg/dL   Optimal  100-129  mg/dL   Near or Above                    Optimal  130-159  mg/dL   Borderline  160-189  mg/dL   High  >190     mg/dL   Very High Performed at [facility] [address] [place], [place] [address]   CBC     Status: Abnormal   Collection Time: 07/02/19  4:11  AM  Result Value Ref Range   WBC 11.1 (H) 4.0 - 10.5 K/uL   RBC 5.41 (H) 3.87 - 5.11 MIL/uL   Hemoglobin 16.2 (H) 12.0 - 15.0 g/dL   HCT 47.4 (H) 36.0 - 46.0 %   MCV 87.6 80.0 - 100.0 fL   MCH 29.9 26.0 - 34.0 pg   MCHC 34.2 30.0 - 36.0 g/dL   RDW 12.0 11.5 - 15.5 %   Platelets 329 150 - 400 K/uL   nRBC 0.0 0.0 - 0.2 %    Comment: Performed at [facility], [place] [address], [place], [place] [address]  CBG monitoring, ED     Status: Abnormal   Collection Time:  07/02/19  8:18 AM  Result Value Ref Range   Glucose-Capillary 284 (H) 70 - 99 mg/dL  Rapid urine drug screen (hospital performed)     Status: None   Collection Time: 07/02/19  8:28 AM  Result Value Ref Range   Opiates NONE DETECTED NONE DETECTED   Cocaine NONE DETECTED NONE DETECTED   Benzodiazepines NONE DETECTED NONE DETECTED   Amphetamines NONE DETECTED NONE DETECTED   Tetrahydrocannabinol NONE DETECTED NONE DETECTED   Barbiturates NONE DETECTED NONE DETECTED    Comment: (NOTE) DRUG SCREEN FOR MEDICAL PURPOSES ONLY.  IF CONFIRMATION IS NEEDED FOR ANY PURPOSE, NOTIFY LAB WITHIN 5 DAYS. LOWEST DETECTABLE LIMITS FOR URINE DRUG SCREEN Drug Class                     Cutoff (ng/mL) Amphetamine and metabolites    1000 Barbiturate and metabolites    200 Benzodiazepine                 709 Tricyclics and metabolites     300 Opiates and metabolites        300 Cocaine and metabolites        300 THC                            50 Performed at [facility], [place] [address] [place], [place] [address]   ECHOCARDIOGRAM COMPLETE     Status: None   Collection Time: 07/02/19 12:15 PM  Result Value Ref Range   BP 143/67 mmHg  CBG monitoring, ED     Status: Abnormal   Collection Time: 07/02/19 12:38 PM  Result Value Ref Range   Glucose-Capillary 226 (H) 70 - 99 mg/dL   Comment 1 Notify RN    Comment 2 Document in Chart   Glucose, capillary     Status: Abnormal   Collection Time: 07/02/19  5:05 PM  Result Value Ref Range   Glucose-Capillary 266 (H) 70 - 99 mg/dL  Glucose, capillary     Status: Abnormal   Collection Time: 07/02/19  8:23 PM  Result Value Ref Range   Glucose-Capillary 227 (H) 70 - 99 mg/dL  Glucose, capillary     Status: Abnormal   Collection Time: 07/02/19 11:34 PM  Result Value Ref Range   Glucose-Capillary 179 (H) 70 - 99 mg/dL   Comment 1 Notify RN   CBC     Status: None   Collection Time: 07/03/19  3:18 AM  Result Value Ref Range   WBC 9.5 4.0 - 10.5 K/uL    RBC 4.90 3.87 - 5.11 MIL/uL   Hemoglobin 14.6 12.0 - 15.0 g/dL   HCT 43.0 36.0 - 46.0 %   MCV 87.8 80.0 - 100.0  fL   MCH 29.8 26.0 - 34.0 pg   MCHC 34.0 30.0 - 36.0 g/dL   RDW 12.2 11.5 - 15.5 %   Platelets 286 150 - 400 K/uL   nRBC 0.0 0.0 - 0.2 %    Comment: Performed at [facility], [place] [address], [place], [place] [address]  Basic metabolic panel     Status: Abnormal   Collection Time: 07/03/19  3:18 AM  Result Value Ref Range   Sodium 138 135 - 145 mmol/L   Potassium 2.9 (L) 3.5 - 5.1 mmol/L   Chloride 100 98 - 111 mmol/L   CO2 28 22 - 32 mmol/L   Glucose, Bld 136 (H) 70 - 99 mg/dL   BUN 14 8 - 23 mg/dL   Creatinine, Ser 0.80 0.44 - 1.00 mg/dL   Calcium 8.6 (L) 8.9 - 10.3 mg/dL   GFR calc non Af Amer >60 >60 mL/min   GFR calc Af Amer >60 >60 mL/min   Anion gap 10 5 - 15    Comment: Performed at [facility], [place] [address], [place], [place] [address]  Glucose, capillary     Status: Abnormal   Collection Time: 07/03/19  6:54 AM  Result Value Ref Range   Glucose-Capillary 156 (H) 70 - 99 mg/dL   Comment 1 Notify RN   Glucose, capillary     Status: Abnormal   Collection Time: 07/03/19 12:33 PM  Result Value Ref Range   Glucose-Capillary 224 (H) 70 - 99 mg/dL  Glucose, capillary     Status: Abnormal   Collection Time: 07/03/19  5:10 PM  Result Value Ref Range   Glucose-Capillary 225 (H) 70 - 99 mg/dL  Glucose, capillary     Status: Abnormal   Collection Time: 07/03/19  8:55 PM  Result Value Ref Range   Glucose-Capillary 268 (H) 70 - 99 mg/dL  Glucose, capillary     Status: Abnormal   Collection Time: 07/04/19  6:18 AM  Result Value Ref Range   Glucose-Capillary 184 (H) 70 - 99 mg/dL   Comment 1 Notify RN   Basic metabolic panel     Status: Abnormal   Collection Time: 07/04/19  7:48 AM  Result Value Ref Range   Sodium 138 135 - 145 mmol/L   Potassium 4.3 3.5 - 5.1 mmol/L   Chloride 103 98 - 111 mmol/L   CO2 28 22 - 32 mmol/L   Glucose, Bld 223 (H)  70 - 99 mg/dL   BUN 13 8 - 23 mg/dL   Creatinine, Ser 0.79 0.44 - 1.00 mg/dL   Calcium 8.7 (L) 8.9 - 10.3 mg/dL   GFR calc non Af Amer >60 >60 mL/min   GFR calc Af Amer >60 >60 mL/min   Anion gap 7 5 - 15    Comment: Performed at [facility], [place] [address], [place], [place] [address]  Glucose, capillary     Status: Abnormal   Collection Time: 07/04/19 12:12 PM  Result Value Ref Range   Glucose-Capillary 279 (H) 70 - 99 mg/dL  Glucose, capillary     Status: Abnormal   Collection Time: 07/04/19  4:52 PM  Result Value Ref Range   Glucose-Capillary 200 (H) 70 - 99 mg/dL  Glucose, capillary     Status: Abnormal   Collection Time: 07/04/19  9:26 PM  Result Value Ref Range   Glucose-Capillary 228 (H) 70 - 99 mg/dL   Comment 1 Notify RN    Comment  2 Document in Chart   Glucose, capillary     Status: Abnormal   Collection Time: 07/05/19  6:10 AM  Result Value Ref Range   Glucose-Capillary 126 (H) 70 - 99 mg/dL   Comment 1 Notify RN    Comment 2 Document in Chart   Glucose, capillary     Status: Abnormal   Collection Time: 07/05/19 11:36 AM  Result Value Ref Range   Glucose-Capillary 217 (H) 70 - 99 mg/dL   Comment 1 Notify RN    Comment 2 Document in Chart   Glucose, capillary     Status: Abnormal   Collection Time: 07/05/19  4:18 PM  Result Value Ref Range   Glucose-Capillary 183 (H) 70 - 99 mg/dL  Glucose, capillary     Status: Abnormal   Collection Time: 07/05/19  9:40 PM  Result Value Ref Range   Glucose-Capillary 226 (H) 70 - 99 mg/dL  Basic metabolic panel     Status: Abnormal   Collection Time: 07/06/19  4:12 AM  Result Value Ref Range   Sodium 138 135 - 145 mmol/L   Potassium 3.3 (L) 3.5 - 5.1 mmol/L   Chloride 99 98 - 111 mmol/L   CO2 30 22 - 32 mmol/L   Glucose, Bld 138 (H) 70 - 99 mg/dL   BUN 10 8 - 23 mg/dL   Creatinine, Ser 0.81 0.44 - 1.00 mg/dL   Calcium 8.7 (L) 8.9 - 10.3 mg/dL   GFR calc non Af Amer >60 >60 mL/min   GFR calc Af Amer >60 >60 mL/min    Anion gap 9 5 - 15    Comment: Performed at [facility], [place] [address], [place], [place] [address]  CBC     Status: None   Collection Time: 07/06/19  4:12 AM  Result Value Ref Range   WBC 10.2 4.0 - 10.5 K/uL   RBC 4.41 3.87 - 5.11 MIL/uL   Hemoglobin 13.7 12.0 - 15.0 g/dL   HCT 38.4 36.0 - 46.0 %   MCV 87.1 80.0 - 100.0 fL   MCH 31.1 26.0 - 34.0 pg   MCHC 35.7 30.0 - 36.0 g/dL   RDW 12.1 11.5 - 15.5 %   Platelets 245 150 - 400 K/uL   nRBC 0.0 0.0 - 0.2 %    Comment: Performed at [facility], [place] [address], [place], [place] [address]  Glucose, capillary     Status: Abnormal   Collection Time: 07/06/19  6:07 AM  Result Value Ref Range   Glucose-Capillary 120 (H) 70 - 99 mg/dL  Glucose, capillary     Status: Abnormal   Collection Time: 07/06/19  9:43 AM  Result Value Ref Range   Glucose-Capillary 135 (H) 70 - 99 mg/dL  Glucose, capillary     Status: Abnormal   Collection Time: 07/06/19 12:19 PM  Result Value Ref Range   Glucose-Capillary 276 (H) 70 - 99 mg/dL   Comment 1 Notify RN    Comment 2 Document in Chart   Novel Coronavirus, NAA (Hosp order, Send-out to Ref Lab; TAT 18-24 hrs     Status: None   Collection Time: 07/06/19  2:45 PM   Specimen: Nasopharyngeal Swab; Respiratory  Result Value Ref Range   SARS-CoV-2, NAA NOT DETECTED NOT DETECTED    Comment: (NOTE) This nucleic acid amplification test was developed and its performance characteristics determined by [employer]. Nucleic acid amplification tests include PCR and TMA. This test has not been FDA  cleared or approved. This test has been authorized by FDA under an Emergency Use Authorization (EUA). This test is only authorized for the duration of time the declaration that circumstances exist justifying the authorization of the emergency use of in vitro diagnostic tests for detection of SARS-CoV-2 virus and/or diagnosis of COVID-19 infection under section 564(b)(1) of the Act, 21 U.S.C.  474QVZ-5(G) (1), unless the authorization is terminated or revoked sooner. When diagnostic testing is negative, the possibility of a false negative result should be considered in the context of a patient's recent exposures and the presence of clinical signs and symptoms consistent with COVID-19. An individual without symptoms of COVID- 19 and who is not shedding SARS-CoV-2 vi rus would expect to have a negative (not detected) result in this assay. Performed At: [facility] [place], [place] [id number] Rush Farmer MD RJ:[id number]    Coronavirus Source NASOPHARYNGEAL     Comment: Performed at [facility], [place] [address] [place], [place] [address]  Glucose, capillary     Status: Abnormal   Collection Time: 07/06/19  3:49 PM  Result Value Ref Range   Glucose-Capillary 265 (H) 70 - 99 mg/dL  Glucose, capillary     Status: Abnormal   Collection Time: 07/06/19  9:38 PM  Result Value Ref Range   Glucose-Capillary 141 (H) 70 - 99 mg/dL  Basic metabolic panel     Status: Abnormal   Collection Time: 07/07/19  3:58 AM  Result Value Ref Range   Sodium 137 135 - 145 mmol/L   Potassium 3.3 (L) 3.5 - 5.1 mmol/L   Chloride 100 98 - 111 mmol/L   CO2 29 22 - 32 mmol/L   Glucose, Bld 161 (H) 70 - 99 mg/dL   BUN 11 8 - 23 mg/dL   Creatinine, Ser 0.76 0.44 - 1.00 mg/dL   Calcium 8.5 (L) 8.9 - 10.3 mg/dL   GFR calc non Af Amer >60 >60 mL/min   GFR calc Af Amer >60 >60 mL/min   Anion gap 8 5 - 15    Comment: Performed at [facility], [place] [address] [place], [place] [address]  Glucose, capillary     Status: Abnormal   Collection Time: 07/07/19  6:28 AM  Result Value Ref Range   Glucose-Capillary 126 (H) 70 - 99 mg/dL  Glucose, capillary     Status: Abnormal   Collection Time: 07/07/19 11:29 AM  Result Value Ref Range   Glucose-Capillary 205 (H) 70 - 99 mg/dL   Comment 1 Notify RN    Comment 2 Document in Chart   Glucose, capillary     Status: Abnormal    Collection Time: 07/07/19  3:47 PM  Result Value Ref Range   Glucose-Capillary 147 (H) 70 - 99 mg/dL   Comment 1 Notify RN    Comment 2 Document in Chart   Glucose, capillary     Status: Abnormal   Collection Time: 07/07/19  9:02 PM  Result Value Ref Range   Glucose-Capillary 207 (H) 70 - 99 mg/dL  Basic metabolic panel     Status: Abnormal   Collection Time: 07/08/19  3:33 AM  Result Value Ref Range   Sodium 137 135 - 145 mmol/L   Potassium 3.5 3.5 - 5.1 mmol/L   Chloride 99 98 - 111 mmol/L   CO2 28 22 - 32 mmol/L   Glucose, Bld 131 (H) 70 - 99 mg/dL   BUN 10 8 - 23 mg/dL   Creatinine,  Ser 0.77 0.44 - 1.00 mg/dL   Calcium 8.7 (L) 8.9 - 10.3 mg/dL   GFR calc non Af Amer >60 >60 mL/min   GFR calc Af Amer >60 >60 mL/min   Anion gap 10 5 - 15    Comment: Performed at [place] [facility] [address], [place], [place] [address]  Glucose, capillary     Status: Abnormal   Collection Time: 07/08/19  6:36 AM  Result Value Ref Range   Glucose-Capillary 120 (H) 70 - 99 mg/dL  Glucose, capillary     Status: Abnormal   Collection Time: 07/08/19 11:39 AM  Result Value Ref Range   Glucose-Capillary 219 (H) 70 - 99 mg/dL  Glucose, capillary     Status: Abnormal   Collection Time: 07/08/19  4:15 PM  Result Value Ref Range   Glucose-Capillary 155 (H) 70 - 99 mg/dL  COMPLETE METABOLIC PANEL WITH GFR     Status: Abnormal   Collection Time: 07/24/19 12:00 AM  Result Value Ref Range   Glucose, Bld 177 (H) 65 - 99 mg/dL    Comment: .            Fasting reference interval . For someone without known diabetes, a glucose value >125 mg/dL indicates that they may have diabetes and this should be confirmed with a follow-up test. .    BUN 15 7 - 25 mg/dL   Creat 0.85 0.50 - 0.99 mg/dL    Comment: For patients >61 years of age, the reference limit for Creatinine is approximately 13% higher for people identified as African-American. .    GFR, Est Non African American 71 > OR = 60  mL/min/1.47m   GFR, Est African American 83 > OR = 60 mL/min/1.712m  BUN/Creatinine Ratio NOT APPLICABLE 6 - 22 (calc)   Sodium 140 135 - 146 mmol/L   Potassium 3.5 3.5 - 5.3 mmol/L   Chloride 100 98 - 110 mmol/L   CO2 28 20 - 32 mmol/L   Calcium 9.3 8.6 - 10.4 mg/dL   Total Protein 6.8 6.1 - 8.1 g/dL   Albumin 3.7 3.6 - 5.1 g/dL   Globulin 3.1 1.9 - 3.7 g/dL (calc)   AG Ratio 1.2 1.0 - 2.5 (calc)   Total Bilirubin 0.4 0.2 - 1.2 mg/dL   Alkaline phosphatase (APISO) 114 37 - 153 U/L   AST 14 10 - 35 U/L   ALT 14 6 - 29 U/L  Cytology - PAP     Status: None   Collection Time: 08/24/19  2:21 PM  Result Value Ref Range   High risk HPV Negative    Adequacy      Satisfactory for evaluation; transformation zone component PRESENT.   Diagnosis      - Negative for Intraepithelial Lesions or Malignancy (NILM)   Diagnosis - Benign reactive/reparative changes       PHQ2/9: [facility] 09/18/2019 08/24/2019 08/24/2019 07/24/2019 01/28/2019  Decreased Interest 0 0 0 0 0  Down, Depressed, Hopeless 0 0 0 0 0  PHQ - 2 Score 0 0 0 0 0  Altered sleeping 0 0 - 0 0  Tired, decreased energy 0 0 - 0 0  Change in appetite 0 0 - 0 0  Feeling bad or failure about yourself  0 0 - 0 0  Trouble concentrating 0 0 - 0 0  Moving slowly or fidgety/restless 0 0 - 0 0  Suicidal thoughts 0 0 - 0 0  PHQ-9 Score 0  0 - 0 0  Difficult doing work/chores - - - - Not difficult at all    phq 9 is negative   Fall Risk: Fall Risk  09/18/2019 08/24/2019 07/24/2019 02/12/2019 01/28/2019  Falls in the past year? 0 0 0 0 0  Number falls in past yr: 0 - 0 - 0  Injury with Fall? 0 - 0 - 0      Functional Status Survey: Is the patient deaf or have difficulty hearing?: No Does the patient have difficulty seeing, even when wearing glasses/contacts?: Yes Does the patient have difficulty walking or climbing stairs?: Yes Does the patient have difficulty dressing or bathing?: No Does the patient have  difficulty doing errands alone such as visiting a doctor's office or shopping?: Yes    Assessment & Plan  1. History of CVA with residual deficit  We will extend time off work until March since she will see neurologist at the end of Feb  2. Hypertension, uncontrolled  - Lipid panel - COMPLETE METABOLIC PANEL WITH GFR  3. Dyslipidemia   4. Hypertension associated with diabetes ([place])  At goal   5. OSA (obstructive sleep apnea)  She will go back to Dr. Humphrey Rolls next week   6. Hypertension, benign  At goal   7. Controlled type 2 diabetes with neuropathy (H[facility])  - Lipid panel - COMPLETE METABOLIC PANEL WITH GFR - Hemoglobin A1c

## 2019-09-19 LAB — COMPLETE METABOLIC PANEL WITH GFR
AG Ratio: 1.4 (calc) (ref 1.0–2.5)
ALT: 14 U/L (ref 6–29)
AST: 19 U/L (ref 10–35)
Albumin: 4.1 g/dL (ref 3.6–5.1)
Alkaline phosphatase (APISO): 78 U/L (ref 37–153)
BUN: 17 mg/dL (ref 7–25)
CO2: 29 mmol/L (ref 20–32)
Calcium: 9.8 mg/dL (ref 8.6–10.4)
Chloride: 97 mmol/L — ABNORMAL LOW (ref 98–110)
Creat: 0.99 mg/dL (ref 0.50–0.99)
GFR, Est African American: 69 mL/min/{1.73_m2} (ref >=60)
GFR, Est Non African American: 59 mL/min/{1.73_m2} — ABNORMAL LOW (ref >=60)
Globulin: 3 g/dL (calc) (ref 1.9–3.7)
Glucose, Bld: 95 mg/dL (ref 65–99)
Potassium: 3.7 mmol/L (ref 3.5–5.3)
Sodium: 139 mmol/L (ref 135–146)
Total Bilirubin: 0.6 mg/dL (ref 0.2–1.2)
Total Protein: 7.1 g/dL (ref 6.1–8.1)

## 2019-09-19 LAB — LIPID PANEL
Cholesterol: 89 mg/dL (ref <200)
HDL: 30 mg/dL — ABNORMAL LOW (ref >=50)
LDL Cholesterol (Calc): 40 mg/dL (calc)
Non-HDL Cholesterol (Calc): 59 mg/dL (calc) (ref <130)
Total CHOL/HDL Ratio: 3 (calc) (ref <5.0)
Triglycerides: 100 mg/dL (ref <150)

## 2019-09-19 LAB — HEMOGLOBIN A1C
Hgb A1c MFr Bld: 6.1 % of total Hgb — ABNORMAL HIGH (ref <5.7)
Mean Plasma Glucose: 128 (calc)
eAG (mmol/L): 7.1 (calc)

## 2019-09-20 NOTE — Procedures (Signed)
[facility] [place], [place] [address]  Sleep Specialist: Allyne Gee, MD [facility] Study Interpretation  Patient Name: Meagan [name] Patient MR [id number] DOB:13-Jul-1951  Date of Study: September 16, 2019  Indications for study: Hypersomnia snoring  BMI: 33.1 kg/m       Respiratory Data:  Total AHI: 14.2/h  Total Obstructive Apneas: 5  Total Central Apneas: 7  Total Mixed Apneas: 0  Total Hypopneas: 78  If the AHI is greater than 5 per hour patient qualifies for PAP evaluation  Oximetry Data:  Oxygen Desaturation Index: 17.5/h  Lowest Desaturation: 79%  Cardiac Data:  Minimum Heart Rate: 84  Maximum Heart Rate: 109   Impression / Diagnosis:  This apnea study is consistent with mild to moderate obstructive sleep apnea.  The patient's AHI is 14.2/h.  Patient also did have significant oxygen desaturation down to 79%.  Patient should be considered for a in lab CPAP titration study.  GENERAL Recommendations:  1.  Consider Auto PAP with pressure ranges 5-20 cmH20 with download, or facility based PAP Titration Study  2.  Consider PAP interface mask fitted for patient comfort, Heated Humidification & PAP compliance monitoring (1 month, 3 months & 12 months after PAP initiation)  3. Consider treatment with mandibular advancement splint (MAS) or referral to an ENT surgeon for modification to the upper airway if the patient prefers an alternate therapy or the PAP trial is unsuccessful  4. Sleep hygiene measures should be discussed with the patient  5. Behavioral therapy such as weight reduction or smoking cessation as appropriate for the patient  6. Advise patient against the use of alcohol or sedatives in so much as these substances can worsen excessive daytime sleepiness and respiratory disturbances of sleep  7. Advise patient against participating in potentially dangerous activities while drowsy such as operating a  motor vehicle, heavy equipment or power tools as it can put them and others in danger  8. Advise patient of the long term consequences of OSA if left untreated, need for treatment and close follow up  9. Clinical follow up as deemed necessary     This Level III home sleep study was performed using the [employer], a 4 channel screening device subject to limitations. Depending on actual total sleep time, not measured in this study, the AHI (sum of apneas and hypopneas/hr of sleep) and therefore the severity of sleep apnea may be underestimated. As with any single night study, including Level 1 attended PSG, severity of sleep apnea may also be underestimated due to the lack of supine and/or REM sleep.  The interpretation associated with this report is based on normal values and degrees of severity in accordance with AASM parameters and/or estimated from multiple sources in the literature for adults ages 106-80+. These may not agree with the displayed values. The patient's treating physician should use the interpretation and recommendations in conjunction with the overall clinical evaluation and treatment of the patient.  Some of the terminology used in this scored [facility] report was developed several years ago and may not always be in accordance with current nomenclature. This in no way affects the accuracy of the data or the reliability of the interpretation and recommendations.

## 2019-09-22 ENCOUNTER — Encounter: Payer: Self-pay | Admitting: Internal Medicine

## 2019-09-22 ENCOUNTER — Other Ambulatory Visit: Payer: Self-pay

## 2019-09-22 ENCOUNTER — Ambulatory Visit: Payer: BC Managed Care – PPO | Admitting: Internal Medicine

## 2019-09-22 VITALS — BP 127/75 | HR 100 | Resp 16 | Ht 64.0 in | Wt 187.8 lb

## 2019-09-22 DIAGNOSIS — J301 Allergic rhinitis due to pollen: Secondary | ICD-10-CM | POA: Diagnosis not present

## 2019-09-22 DIAGNOSIS — G4733 Obstructive sleep apnea (adult) (pediatric): Secondary | ICD-10-CM

## 2019-09-22 DIAGNOSIS — Z8673 Personal history of transient ischemic attack (TIA), and cerebral infarction without residual deficits: Secondary | ICD-10-CM

## 2019-09-22 NOTE — Progress Notes (Signed)
[facility] [place], [place] [address]  Pulmonary Sleep Medicine   Office Visit Note  Patient Name: Meagan [name] DOB: 10/20/1950 MRN [id number]  Date of Service: 09/22/2019  Complaints/HPI: Patient is here for follow-up of obstructive sleep apnea.  She had a home sleep study done which showed significant sleep apnea with an index of 14.2/h she also has significant oxygen desaturation down to 78%.  Patient does have a history of stroke she still has some visual field defects as a result of the stroke.  She is going to need to be set up on CPAP she will be set up with an auto titrating device because it is difficult for her to come into the lab.  She denies having any headaches no dizziness no worsening of her stroke allergies are about the same as far as weight is concerned she has not really been able to do much as far as weight loss concern.  ROS  General: (-) fever, (-) chills, (-) night sweats, (-) weakness Skin: (-) rashes, (-) itching,. Eyes: (-) visual changes, (-) redness, (-) itching. Nose and Sinuses: (-) nasal stuffiness or itchiness, (-) postnasal drip, (-) nosebleeds, (-) sinus trouble. Mouth and Throat: (-) sore throat, (-) hoarseness. Neck: (-) swollen glands, (-) enlarged thyroid, (-) neck pain. Respiratory: - cough, (-) bloody sputum, - shortness of breath, - wheezing. Cardiovascular: - ankle swelling, (-) chest pain. Lymphatic: (-) lymph node enlargement. Neurologic: (-) numbness, (-) tingling. Psychiatric: (-) anxiety, (-) depression   Current Medication: Outpatient Encounter Medications as of 09/22/2019  Medication Sig  . amLODipine (NORVASC) 5 MG tablet Take 1 tablet (5 mg total) by mouth daily.  Marland Kitchen aspirin 325 MG tablet Take 1 tablet (325 mg total) by mouth daily. Take for 3 months only.  Marland Kitchen atorvastatin (LIPITOR) 80 MG tablet Take 1 tablet (80 mg total) by mouth daily at 6 PM.  . blood glucose meter kit and supplies Dispense based on  patient and insurance preference. Use up to four times daily as directed. (FOR ICD-10 E10.9, E11.9).  Marland Kitchen clopidogrel (PLAVIX) 75 MG tablet Take 1 tablet (75 mg total) by mouth daily.  . hydrALAZINE (APRESOLINE) 10 MG tablet Take 1 tablet (10 mg total) by mouth 3 (three) times daily as needed. If bp goes above 150/90  . Insulin Detemir (LEVEMIR FLEXTOUCH) 100 UNIT/ML Pen Inject 25-50 Units into the skin daily.  . Insulin Pen Needle 32G X 4 MM MISC 1 each by Does not apply route daily.  Marland Kitchen levocetirizine (XYZAL) 5 MG tablet Take 5 mg by mouth as needed for allergies.  . metFORMIN (GLUCOPHAGE-XR) 750 MG 24 hr tablet Take 1 tablet (750 mg total) by mouth 2 (two) times daily.  Marland Kitchen olmesartan-hydrochlorothiazide (BENICAR HCT) 40-25 MG tablet Take 1 tablet by mouth daily.  Glory Rosebush ULTRA test strip   . polyethylene glycol (MIRALAX / GLYCOLAX) 17 g packet Take 17 g by mouth daily. (Patient not taking: Reported on 09/18/2019)  . Semaglutide, 1 MG/DOSE, (OZEMPIC, 1 MG/DOSE,) 2 MG/1.5ML SOPN Inject 1 mg into the skin every 7 (seven) days.  . traMADol (ULTRAM) 50 MG tablet Take 1 tablet (50 mg total) by mouth every 6 (six) hours as needed for moderate pain. (Patient not taking: Reported on 09/18/2019)   No facility-administered encounter medications on file as of 09/22/2019.    Surgical History: Past Surgical History:  Procedure Laterality Date  . APPENDECTOMY  2000s  . BIOPSY THYROID  2006   [facility]  .  BREAST EXCISIONAL BIOPSY Left 2000s   neg  . CARPAL TUNNEL RELEASE Left March 2015  . COLONOSCOPY  2011   DR.WOHL  . COLONOSCOPY WITH PROPOFOL N/A 07/11/2017   Procedure: COLONOSCOPY WITH PROPOFOL;  Surgeon: Jonathon Bellows, MD;  Location: [facility] ENDOSCOPY;  Service: Gastroenterology;  Laterality: N/A;  . ganglion cyst removal     . KNEE SURGERY Left 08/2009  . KNEE SURGERY Right 04/07/2010  . ROTATOR CUFF REPAIR Right 2008  . TUBAL LIGATION      Medical History: Past Medical History:  Diagnosis Date  .  Cellulitis    left leg  . 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 Diffuse cystic mastopathy   . Fall 2013  . Hypertension 2004  . Occlusion and stenosis of carotid artery without mention of cerebral infarction   . Sleep apnea     Family History: Family History  Problem Relation Age of Onset  . Breast cancer Maternal Aunt   . Hypertension Mother   . Kidney Stones Mother   . Hypertension Father   . Congestive Heart Failure Father   . Hypertension Paternal Grandfather   . Diabetes Sister   . Vision loss Sister   . Varicose Veins Sister   . Hypertension Sister   . Breast cancer Sister 46       Double Mastectomy  . Hypertension Sister     Social History: Social History   Socioeconomic History  . Marital status: Widowed    Spouse name: Not on file  . Number of children: 4  . Years of education: Not on file  . Highest education level: Master's degree (e.g., MA, MS, MEng, MEd, MSW, MBA)  Occupational History  . Occupation: Higher education careers adviser   Tobacco Use  . Smoking status: Never Smoker  . Smokeless tobacco: Never Used  Substance and Sexual Activity  . Alcohol use: No  . Drug use: No  . Sexual activity: Not Currently    Partners: Male    Comment: Widow for 14 years  Other Topics Concern  . Not on file  Social History Narrative   She had a stroke on 06/2019, she has left peripheral vision loss since stroke   She moved in with her youngest sister    Social Determinants of Health   Financial Resource Strain:   . Difficulty of Paying Living Expenses: Not on file  Food Insecurity:   . Worried About Charity fundraiser in the Last Year: Not on file  . Ran Out of Food in the Last Year: Not on file  Transportation Needs:   . Lack of Transportation (Medical): Not on file  . Lack of Transportation (Non-Medical): Not on file  Physical Activity: Inactive  . Days of Exercise per Week: 0 days  . Minutes of Exercise per Session: 0 min  Stress:    . Feeling of Stress : Not on file  Social Connections:   . Frequency of Communication with Friends and Family: Not on file  . Frequency of Social Gatherings with Friends and Family: Not on file  . Attends Religious Services: Not on file  . Active Member of Clubs or Organizations: Not on file  . Attends Archivist Meetings: Not on file  . Marital Status: Not on file  Intimate Partner Violence:   . Fear of Current or Ex-Partner: Not on file  . Emotionally Abused: Not on file  . Physically Abused: Not on file  . Sexually Abused: Not on  file    Vital Signs: Blood pressure 127/75, pulse 100, resp. rate 16, height '5\' 4"'  (1.626 m), weight 187 lb 12.8 oz (85.2 kg), SpO2 98 %.  Examination: General Appearance: The patient is well-developed, well-nourished, and in no distress. Skin: Gross inspection of skin unremarkable. Head: normocephalic, no gross deformities. Eyes: no gross deformities noted. ENT: ears appear grossly normal no exudates. Neck: Supple. No thyromegaly. No LAD. Respiratory: No ronchi were noted. Cardiovascular: Normal S1 and S2 without murmur or rub. Extremities: No cyanosis. pulses are equal. Neurologic: Alert and oriented. No involuntary movements.  LABS: Recent Results (from the past 2160 hour(s))  Lipase, blood     Status: None   Collection Time: 07/01/19 12:04 PM  Result Value Ref Range   Lipase 33 11 - 51 U/L    Comment: Performed at [facility], [address], [place], [place] [address]  Comprehensive metabolic panel     Status: Abnormal   Collection Time: 07/01/19 12:04 PM  Result Value Ref Range   Sodium 132 (L) 135 - 145 mmol/L   Potassium 3.9 3.5 - 5.1 mmol/L   Chloride 93 (L) 98 - 111 mmol/L   CO2 25 22 - 32 mmol/L   Glucose, Bld 316 (H) 70 - 99 mg/dL   BUN 11 8 - 23 mg/dL   Creatinine, Ser 0.82 0.44 - 1.00 mg/dL   Calcium 9.0 8.9 - 10.3 mg/dL   Total Protein 7.7 6.5 - 8.1 g/dL   Albumin 3.1 (L) 3.5 - 5.0 g/dL   AST 17 15 - 41  U/L   ALT 18 0 - 44 U/L   Alkaline Phosphatase 126 38 - 126 U/L   Total Bilirubin 0.7 0.3 - 1.2 mg/dL   GFR calc non Af Amer >60 >60 mL/min   GFR calc Af Amer >60 >60 mL/min   Anion gap 14 5 - 15    Comment: Performed at [facility], [place] [address], [place], [place] [address]  CBC     Status: Abnormal   Collection Time: 07/01/19 12:04 PM  Result Value Ref Range   WBC 11.9 (H) 4.0 - 10.5 K/uL   RBC 5.40 (H) 3.87 - 5.11 MIL/uL   Hemoglobin 16.5 (H) 12.0 - 15.0 g/dL   HCT 46.8 (H) 36.0 - 46.0 %   MCV 86.7 80.0 - 100.0 fL   MCH 30.6 26.0 - 34.0 pg   MCHC 35.3 30.0 - 36.0 g/dL   RDW 12.1 11.5 - 15.5 %   Platelets 297 150 - 400 K/uL   nRBC 0.0 0.0 - 0.2 %    Comment: Performed at [facility], [place] [address], [place], [place] [address]  Sedimentation rate     Status: Abnormal   Collection Time: 07/01/19  2:30 PM  Result Value Ref Range   Sed Rate 55 (H) 0 - 22 mm/hr    Comment: Performed at [place] [address], [place], [place] [address]  C-reactive protein     Status: Abnormal   Collection Time: 07/01/19  2:30 PM  Result Value Ref Range   CRP 1.2 (H) <1.0 mg/dL    Comment: Performed at [facility], [place] [address] [place], [place] [address]  Urinalysis, Routine w reflex microscopic     Status: Abnormal   Collection Time: 07/01/19  3:08 PM  Result Value Ref Range   Color, Urine YELLOW YELLOW   APPearance CLEAR CLEAR   Specific Gravity, Urine 1.008 1.005 -  1.030   pH 6.0 5.0 - 8.0   Glucose, UA >=500 (A) NEGATIVE mg/dL   Hgb urine dipstick NEGATIVE NEGATIVE   Bilirubin Urine NEGATIVE NEGATIVE   Ketones, ur NEGATIVE NEGATIVE mg/dL   Protein, ur 100 (A) NEGATIVE mg/dL   Nitrite NEGATIVE NEGATIVE   Leukocytes,Ua NEGATIVE NEGATIVE   RBC / HPF 0-5 0 - 5 RBC/hpf   WBC, UA 0-5 0 - 5 WBC/hpf   Bacteria, UA RARE (A) NONE SEEN   Squamous Epithelial / LPF 0-5 0 - 5   Hyaline Casts, UA PRESENT     Comment: Performed at [facility], [place] [address], [place], [place] [address]  SARS CORONAVIRUS 2 (TAT 6-24 HRS) Nasopharyngeal Nasopharyngeal Swab     Status: None   Collection Time: 07/01/19  6:59 PM   Specimen: Nasopharyngeal Swab  Result Value Ref Range   SARS Coronavirus 2 NEGATIVE NEGATIVE    Comment: (NOTE) SARS-CoV-2 target nucleic acids are NOT DETECTED. The SARS-CoV-2 RNA is generally detectable in upper and lower respiratory specimens during the acute phase of infection. Negative results do not preclude SARS-CoV-2 infection, do not rule out co-infections with other pathogens, and should not be used as the sole basis for treatment or other patient management decisions. Negative results must be combined with clinical observations, patient history, and epidemiological information. The expected result is Negative. Fact Sheet for Patients: SugarRoll.be Fact Sheet for Healthcare Providers: https://www.woods-mathews.com/ This test is not yet approved or cleared by the [place] FDA and  has been authorized for detection and/or diagnosis of SARS-CoV-2 by FDA under an Emergency Use Authorization (EUA). This EUA will remain  in effect (meaning this test can be used) for the duration of the COVID-19 declaration under Section 56 4(b)(1) of the Act, 21 U.S.C. section 360bbb-3(b)(1), unless the authorization is terminated or revoked sooner. Performed at [facility], [place] [address] [place], [place] [address]   CBG monitoring, ED     Status: Abnormal   Collection Time: 07/01/19 11:21 PM  Result Value Ref Range   Glucose-Capillary 327 (H) 70 - 99 mg/dL  HIV Antibody  (Routine Testing)     Status: None   Collection Time: 07/02/19  4:11 AM  Result Value Ref Range   HIV Screen 4th Generation wRfx Non Reactive Non Reactive    Comment: (NOTE) Performed At: [facility] [place], [place] [id number] Rush Farmer MD [id number]   Hemoglobin A1c      Status: Abnormal   Collection Time: 07/02/19  4:11 AM  Result Value Ref Range   Hgb A1c MFr Bld 10.1 (H) 4.8 - 5.6 %    Comment: (NOTE) Pre diabetes:          5.7%-6.4% Diabetes:              >6.4% Glycemic control for   <7.0% adults with diabetes    Mean Plasma Glucose 243.17 mg/dL    Comment: Performed at [place] [address], [place], [place] [address]  Lipid panel     Status: Abnormal   Collection Time: 07/02/19  4:11 AM  Result Value Ref Range   Cholesterol 267 (H) 0 - 200 mg/dL   Triglycerides 182 (H) <150 mg/dL   HDL 32 (L) >40 mg/dL   Total CHOL/HDL Ratio 8.3 RATIO   VLDL 36 0 - 40 mg/dL   LDL Cholesterol 199 (H) 0 - 99 mg/dL    Comment:  Total Cholesterol/HDL:CHD Risk Coronary Heart Disease Risk Table                     Men   Women  1/2 Average Risk   3.4   3.3  Average Risk       5.0   4.4  2 X Average Risk   9.6   7.1  3 X Average Risk  23.4   11.0        Use the calculated Patient Ratio above and the CHD Risk Table to determine the patient's CHD Risk.        ATP III CLASSIFICATION (LDL):  <100     mg/dL   Optimal  100-129  mg/dL   Near or Above                    Optimal  130-159  mg/dL   Borderline  160-189  mg/dL   High  >190     mg/dL   Very High Performed at [facility] [address] [place], [place] [address]   CBC     Status: Abnormal   Collection Time: 07/02/19  4:11 AM  Result Value Ref Range   WBC 11.1 (H) 4.0 - 10.5 K/uL   RBC 5.41 (H) 3.87 - 5.11 MIL/uL   Hemoglobin 16.2 (H) 12.0 - 15.0 g/dL   HCT 47.4 (H) 36.0 - 46.0 %   MCV 87.6 80.0 - 100.0 fL   MCH 29.9 26.0 - 34.0 pg   MCHC 34.2 30.0 - 36.0 g/dL   RDW 12.0 11.5 - 15.5 %   Platelets 329 150 - 400 K/uL   nRBC 0.0 0.0 - 0.2 %    Comment: Performed at [facility], [place] [address], [place], [place] [address]  CBG monitoring, ED     Status: Abnormal   Collection Time: 07/02/19  8:18 AM  Result Value Ref Range   Glucose-Capillary 284 (H) 70 - 99  mg/dL  Rapid urine drug screen (hospital performed)     Status: None   Collection Time: 07/02/19  8:28 AM  Result Value Ref Range   Opiates NONE DETECTED NONE DETECTED   Cocaine NONE DETECTED NONE DETECTED   Benzodiazepines NONE DETECTED NONE DETECTED   Amphetamines NONE DETECTED NONE DETECTED   Tetrahydrocannabinol NONE DETECTED NONE DETECTED   Barbiturates NONE DETECTED NONE DETECTED    Comment: (NOTE) DRUG SCREEN FOR MEDICAL PURPOSES ONLY.  IF CONFIRMATION IS NEEDED FOR ANY PURPOSE, NOTIFY LAB WITHIN 5 DAYS. LOWEST DETECTABLE LIMITS FOR URINE DRUG SCREEN Drug Class                     Cutoff (ng/mL) Amphetamine and metabolites    1000 Barbiturate and metabolites    200 Benzodiazepine                 242 Tricyclics and metabolites     300 Opiates and metabolites        300 Cocaine and metabolites        300 THC                            50 Performed at [facility], [place] [address] [place], [place] [address]   ECHOCARDIOGRAM COMPLETE     Status: None   Collection Time: 07/02/19 12:15 PM  Result Value Ref Range   BP 143/67 mmHg  CBG  monitoring, ED     Status: Abnormal   Collection Time: 07/02/19 12:38 PM  Result Value Ref Range   Glucose-Capillary 226 (H) 70 - 99 mg/dL   Comment 1 Notify RN    Comment 2 Document in Chart   Glucose, capillary     Status: Abnormal   Collection Time: 07/02/19  5:05 PM  Result Value Ref Range   Glucose-Capillary 266 (H) 70 - 99 mg/dL  Glucose, capillary     Status: Abnormal   Collection Time: 07/02/19  8:23 PM  Result Value Ref Range   Glucose-Capillary 227 (H) 70 - 99 mg/dL  Glucose, capillary     Status: Abnormal   Collection Time: 07/02/19 11:34 PM  Result Value Ref Range   Glucose-Capillary 179 (H) 70 - 99 mg/dL   Comment 1 Notify RN   CBC     Status: None   Collection Time: 07/03/19  3:18 AM  Result Value Ref Range   WBC 9.5 4.0 - 10.5 K/uL   RBC 4.90 3.87 - 5.11 MIL/uL   Hemoglobin 14.6 12.0 - 15.0 g/dL   HCT 43.0  36.0 - 46.0 %   MCV 87.8 80.0 - 100.0 fL   MCH 29.8 26.0 - 34.0 pg   MCHC 34.0 30.0 - 36.0 g/dL   RDW 12.2 11.5 - 15.5 %   Platelets 286 150 - 400 K/uL   nRBC 0.0 0.0 - 0.2 %    Comment: Performed at [facility], [place] [address], [place], [place] [address]  Basic metabolic panel     Status: Abnormal   Collection Time: 07/03/19  3:18 AM  Result Value Ref Range   Sodium 138 135 - 145 mmol/L   Potassium 2.9 (L) 3.5 - 5.1 mmol/L   Chloride 100 98 - 111 mmol/L   CO2 28 22 - 32 mmol/L   Glucose, Bld 136 (H) 70 - 99 mg/dL   BUN 14 8 - 23 mg/dL   Creatinine, Ser 0.80 0.44 - 1.00 mg/dL   Calcium 8.6 (L) 8.9 - 10.3 mg/dL   GFR calc non Af Amer >60 >60 mL/min   GFR calc Af Amer >60 >60 mL/min   Anion gap 10 5 - 15    Comment: Performed at [facility], [place] [address] [place], [place] [address]  Glucose, capillary     Status: Abnormal   Collection Time: 07/03/19  6:54 AM  Result Value Ref Range   Glucose-Capillary 156 (H) 70 - 99 mg/dL   Comment 1 Notify RN   Glucose, capillary     Status: Abnormal   Collection Time: 07/03/19 12:33 PM  Result Value Ref Range   Glucose-Capillary 224 (H) 70 - 99 mg/dL  Glucose, capillary     Status: Abnormal   Collection Time: 07/03/19  5:10 PM  Result Value Ref Range   Glucose-Capillary 225 (H) 70 - 99 mg/dL  Glucose, capillary     Status: Abnormal   Collection Time: 07/03/19  8:55 PM  Result Value Ref Range   Glucose-Capillary 268 (H) 70 - 99 mg/dL  Glucose, capillary     Status: Abnormal   Collection Time: 07/04/19  6:18 AM  Result Value Ref Range   Glucose-Capillary 184 (H) 70 - 99 mg/dL   Comment 1 Notify RN   Basic metabolic panel     Status: Abnormal   Collection Time: 07/04/19  7:48 AM  Result Value Ref Range   Sodium 138 135 - 145 mmol/L   Potassium  4.3 3.5 - 5.1 mmol/L   Chloride 103 98 - 111 mmol/L   CO2 28 22 - 32 mmol/L   Glucose, Bld 223 (H) 70 - 99 mg/dL   BUN 13 8 - 23 mg/dL   Creatinine, Ser 0.79 0.44 - 1.00 mg/dL    Calcium 8.7 (L) 8.9 - 10.3 mg/dL   GFR calc non Af Amer >60 >60 mL/min   GFR calc Af Amer >60 >60 mL/min   Anion gap 7 5 - 15    Comment: Performed at [facility] [address] [place], [place] [address]  Glucose, capillary     Status: Abnormal   Collection Time: 07/04/19 12:12 PM  Result Value Ref Range   Glucose-Capillary 279 (H) 70 - 99 mg/dL  Glucose, capillary     Status: Abnormal   Collection Time: 07/04/19  4:52 PM  Result Value Ref Range   Glucose-Capillary 200 (H) 70 - 99 mg/dL  Glucose, capillary     Status: Abnormal   Collection Time: 07/04/19  9:26 PM  Result Value Ref Range   Glucose-Capillary 228 (H) 70 - 99 mg/dL   Comment 1 Notify RN    Comment 2 Document in Chart   Glucose, capillary     Status: Abnormal   Collection Time: 07/05/19  6:10 AM  Result Value Ref Range   Glucose-Capillary 126 (H) 70 - 99 mg/dL   Comment 1 Notify RN    Comment 2 Document in Chart   Glucose, capillary     Status: Abnormal   Collection Time: 07/05/19 11:36 AM  Result Value Ref Range   Glucose-Capillary 217 (H) 70 - 99 mg/dL   Comment 1 Notify RN    Comment 2 Document in Chart   Glucose, capillary     Status: Abnormal   Collection Time: 07/05/19  4:18 PM  Result Value Ref Range   Glucose-Capillary 183 (H) 70 - 99 mg/dL  Glucose, capillary     Status: Abnormal   Collection Time: 07/05/19  9:40 PM  Result Value Ref Range   Glucose-Capillary 226 (H) 70 - 99 mg/dL  Basic metabolic panel     Status: Abnormal   Collection Time: 07/06/19  4:12 AM  Result Value Ref Range   Sodium 138 135 - 145 mmol/L   Potassium 3.3 (L) 3.5 - 5.1 mmol/L   Chloride 99 98 - 111 mmol/L   CO2 30 22 - 32 mmol/L   Glucose, Bld 138 (H) 70 - 99 mg/dL   BUN 10 8 - 23 mg/dL   Creatinine, Ser 0.81 0.44 - 1.00 mg/dL   Calcium 8.7 (L) 8.9 - 10.3 mg/dL   GFR calc non Af Amer >60 >60 mL/min   GFR calc Af Amer >60 >60 mL/min   Anion gap 9 5 - 15    Comment: Performed at [facility], [place] [address], [place], [place] [address]  CBC     Status: None   Collection Time: 07/06/19  4:12 AM  Result Value Ref Range   WBC 10.2 4.0 - 10.5 K/uL   RBC 4.41 3.87 - 5.11 MIL/uL   Hemoglobin 13.7 12.0 - 15.0 g/dL   HCT 38.4 36.0 - 46.0 %   MCV 87.1 80.0 - 100.0 fL   MCH 31.1 26.0 - 34.0 pg   MCHC 35.7 30.0 - 36.0 g/dL   RDW 12.1 11.5 - 15.5 %   Platelets 245 150 - 400 K/uL   nRBC 0.0 0.0 - 0.2 %  Comment: Performed at [facility], [place] [address] [place], [place] [address]  Glucose, capillary     Status: Abnormal   Collection Time: 07/06/19  6:07 AM  Result Value Ref Range   Glucose-Capillary 120 (H) 70 - 99 mg/dL  Glucose, capillary     Status: Abnormal   Collection Time: 07/06/19  9:43 AM  Result Value Ref Range   Glucose-Capillary 135 (H) 70 - 99 mg/dL  Glucose, capillary     Status: Abnormal   Collection Time: 07/06/19 12:19 PM  Result Value Ref Range   Glucose-Capillary 276 (H) 70 - 99 mg/dL   Comment 1 Notify RN    Comment 2 Document in Chart   Novel Coronavirus, NAA (Hosp order, Send-out to Ref Lab; TAT 18-24 hrs     Status: None   Collection Time: 07/06/19  2:45 PM   Specimen: Nasopharyngeal Swab; Respiratory  Result Value Ref Range   SARS-CoV-2, NAA NOT DETECTED NOT DETECTED    Comment: (NOTE) This nucleic acid amplification test was developed and its performance characteristics determined by [employer]. Nucleic acid amplification tests include PCR and TMA. This test has not been FDA cleared or approved. This test has been authorized by FDA under an Emergency Use Authorization (EUA). This test is only authorized for the duration of time the declaration that circumstances exist justifying the authorization of the emergency use of in vitro diagnostic tests for detection of SARS-CoV-2 virus and/or diagnosis of COVID-19 infection under section 564(b)(1) of the Act, 21 U.S.C. 503TWS-5(K) (1), unless the authorization is terminated or revoked  sooner. When diagnostic testing is negative, the possibility of a false negative result should be considered in the context of a patient's recent exposures and the presence of clinical signs and symptoms consistent with COVID-19. An individual without symptoms of COVID- 19 and who is not shedding SARS-CoV-2 vi rus would expect to have a negative (not detected) result in this assay. Performed At: [facility] [place], [place] [id number] Rush Farmer MD [id number]    Coronavirus Source NASOPHARYNGEAL     Comment: Performed at [facility], [place] [address], [place], [place] [address]  Glucose, capillary     Status: Abnormal   Collection Time: 07/06/19  3:49 PM  Result Value Ref Range   Glucose-Capillary 265 (H) 70 - 99 mg/dL  Glucose, capillary     Status: Abnormal   Collection Time: 07/06/19  9:38 PM  Result Value Ref Range   Glucose-Capillary 141 (H) 70 - 99 mg/dL  Basic metabolic panel     Status: Abnormal   Collection Time: 07/07/19  3:58 AM  Result Value Ref Range   Sodium 137 135 - 145 mmol/L   Potassium 3.3 (L) 3.5 - 5.1 mmol/L   Chloride 100 98 - 111 mmol/L   CO2 29 22 - 32 mmol/L   Glucose, Bld 161 (H) 70 - 99 mg/dL   BUN 11 8 - 23 mg/dL   Creatinine, Ser 0.76 0.44 - 1.00 mg/dL   Calcium 8.5 (L) 8.9 - 10.3 mg/dL   GFR calc non Af Amer >60 >60 mL/min   GFR calc Af Amer >60 >60 mL/min   Anion gap 8 5 - 15    Comment: Performed at [facility], [place] [address] [place], [place] [address]  Glucose, capillary     Status: Abnormal   Collection Time: 07/07/19  6:28 AM  Result Value Ref Range   Glucose-Capillary 126 (H) 70 -  99 mg/dL  Glucose, capillary     Status: Abnormal   Collection Time: 07/07/19 11:29 AM  Result Value Ref Range   Glucose-Capillary 205 (H) 70 - 99 mg/dL   Comment 1 Notify RN    Comment 2 Document in Chart   Glucose, capillary     Status: Abnormal   Collection Time: 07/07/19  3:47 PM  Result Value Ref Range    Glucose-Capillary 147 (H) 70 - 99 mg/dL   Comment 1 Notify RN    Comment 2 Document in Chart   Glucose, capillary     Status: Abnormal   Collection Time: 07/07/19  9:02 PM  Result Value Ref Range   Glucose-Capillary 207 (H) 70 - 99 mg/dL  Basic metabolic panel     Status: Abnormal   Collection Time: 07/08/19  3:33 AM  Result Value Ref Range   Sodium 137 135 - 145 mmol/L   Potassium 3.5 3.5 - 5.1 mmol/L   Chloride 99 98 - 111 mmol/L   CO2 28 22 - 32 mmol/L   Glucose, Bld 131 (H) 70 - 99 mg/dL   BUN 10 8 - 23 mg/dL   Creatinine, Ser 0.77 0.44 - 1.00 mg/dL   Calcium 8.7 (L) 8.9 - 10.3 mg/dL   GFR calc non Af Amer >60 >60 mL/min   GFR calc Af Amer >60 >60 mL/min   Anion gap 10 5 - 15    Comment: Performed at [facility], [place] [address] [place], [place] [address]  Glucose, capillary     Status: Abnormal   Collection Time: 07/08/19  6:36 AM  Result Value Ref Range   Glucose-Capillary 120 (H) 70 - 99 mg/dL  Glucose, capillary     Status: Abnormal   Collection Time: 07/08/19 11:39 AM  Result Value Ref Range   Glucose-Capillary 219 (H) 70 - 99 mg/dL  Glucose, capillary     Status: Abnormal   Collection Time: 07/08/19  4:15 PM  Result Value Ref Range   Glucose-Capillary 155 (H) 70 - 99 mg/dL  COMPLETE METABOLIC PANEL WITH GFR     Status: Abnormal   Collection Time: 07/24/19 12:00 AM  Result Value Ref Range   Glucose, Bld 177 (H) 65 - 99 mg/dL    Comment: .            Fasting reference interval . For someone without known diabetes, a glucose value >125 mg/dL indicates that they may have diabetes and this should be confirmed with a follow-up test. .    BUN 15 7 - 25 mg/dL   Creat 0.85 0.50 - 0.99 mg/dL    Comment: For patients >51 years of age, the reference limit for Creatinine is approximately 13% higher for people identified as African-American. .    GFR, Est Non African American 71 > OR = 60 mL/min/1.41m   GFR, Est African American 83 > OR = 60 mL/min/1.725m   BUN/Creatinine Ratio NOT APPLICABLE 6 - 22 (calc)   Sodium 140 135 - 146 mmol/L   Potassium 3.5 3.5 - 5.3 mmol/L   Chloride 100 98 - 110 mmol/L   CO2 28 20 - 32 mmol/L   Calcium 9.3 8.6 - 10.4 mg/dL   Total Protein 6.8 6.1 - 8.1 g/dL   Albumin 3.7 3.6 - 5.1 g/dL   Globulin 3.1 1.9 - 3.7 g/dL (calc)   AG Ratio 1.2 1.0 - 2.5 (calc)   Total Bilirubin 0.4 0.2 - 1.2 mg/dL   Alkaline phosphatase (APISO)  114 37 - 153 U/L   AST 14 10 - 35 U/L   ALT 14 6 - 29 U/L  Cytology - PAP     Status: None   Collection Time: 08/24/19  2:21 PM  Result Value Ref Range   High risk HPV Negative    Adequacy      Satisfactory for evaluation; transformation zone component PRESENT.   Diagnosis      - Negative for Intraepithelial Lesions or Malignancy (NILM)   Diagnosis - Benign reactive/reparative changes   Lipid panel     Status: Abnormal   Collection Time: 09/18/19 12:00 AM  Result Value Ref Range   Cholesterol 89 <200 mg/dL   HDL 30 (L) > OR = 50 mg/dL   Triglycerides 100 <150 mg/dL   LDL Cholesterol (Calc) 40 mg/dL (calc)    Comment: Reference range: <100 . Desirable range <100 mg/dL for primary prevention;   <70 mg/dL for patients with CHD or diabetic patients  with > or = 2 CHD risk factors. Marland Kitchen LDL-C is now calculated using the Martin-Hopkins  calculation, which is a validated novel method providing  better accuracy than the Friedewald equation in the  estimation of LDL-C.  Cresenciano Genre et al. Annamaria Helling. 0383;338(32): 2061-2068  (http://education.[facility].com/faq/FAQ164)    Total CHOL/HDL Ratio 3.0 <5.0 (calc)   Non-HDL Cholesterol (Calc) 59 <130 mg/dL (calc)    Comment: For patients with diabetes plus 1 major ASCVD risk  factor, treating to a non-HDL-C goal of <100 mg/dL  (LDL-C of <70 mg/dL) is considered a therapeutic  option.   COMPLETE METABOLIC PANEL WITH GFR     Status: Abnormal   Collection Time: 09/18/19 12:00 AM  Result Value Ref Range   Glucose, Bld 95 65 - 99 mg/dL     Comment: .            Fasting reference interval .    BUN 17 7 - 25 mg/dL   Creat 0.99 0.50 - 0.99 mg/dL    Comment: For patients >59 years of age, the reference limit for Creatinine is approximately 13% higher for people identified as African-American. .    GFR, Est Non African American 59 (L) > OR = 60 mL/min/1.87m   GFR, Est African American 69 > OR = 60 mL/min/1.779m  BUN/Creatinine Ratio NOT APPLICABLE 6 - 22 (calc)   Sodium 139 135 - 146 mmol/L   Potassium 3.7 3.5 - 5.3 mmol/L   Chloride 97 (L) 98 - 110 mmol/L   CO2 29 20 - 32 mmol/L   Calcium 9.8 8.6 - 10.4 mg/dL   Total Protein 7.1 6.1 - 8.1 g/dL   Albumin 4.1 3.6 - 5.1 g/dL   Globulin 3.0 1.9 - 3.7 g/dL (calc)   AG Ratio 1.4 1.0 - 2.5 (calc)   Total Bilirubin 0.6 0.2 - 1.2 mg/dL   Alkaline phosphatase (APISO) 78 37 - 153 U/L   AST 19 10 - 35 U/L   ALT 14 6 - 29 U/L  Hemoglobin A1c     Status: Abnormal   Collection Time: 09/18/19 12:00 AM  Result Value Ref Range   Hgb A1c MFr Bld 6.1 (H) <5.7 % of total Hgb    Comment: For someone without known diabetes, a hemoglobin  A1c value between 5.7% and 6.4% is consistent with prediabetes and should be confirmed with a  follow-up test. . For someone with known diabetes, a value <7% indicates that their diabetes is well controlled. A1c targets should be individualized based  on duration of diabetes, age, comorbid conditions, and other considerations. . This assay result is consistent with an increased risk of diabetes. . Currently, no consensus exists regarding use of hemoglobin A1c for diagnosis of diabetes for children. .    Mean Plasma Glucose 128 (calc)   eAG (mmol/L) 7.1 (calc)    Radiology: No results found.  No results found.  No results found.    Assessment and Plan: Patient Active Problem List   Diagnosis Date Noted  . History of CVA with residual deficit 07/01/2019  . Leukocytosis 07/01/2019  . Polycythemia 07/01/2019  . Morbid obesity  ([place]) 10/22/2018  . Non compliance with medical treatment 03/26/2018  . Dyslipidemia associated with type 2 diabetes mellitus ([place]) 12/04/2016  . Uncontrolled type 2 diabetes mellitus with microalbuminuria, without long-term current use of insulin ([place]) 12/04/2016  . Seasonal allergies 02/01/2016  . OSA (obstructive sleep apnea) 10/06/2015  . Hypertensive retinopathy 10/06/2015  . Thyroid cyst 10/06/2015  . Dyslipidemia 10/06/2015  . Type 2 diabetes, uncontrolled, with neuropathy ([place]) 10/06/2015  . History of pneumonia 10/06/2015  . Chronic neck pain 10/06/2015  . Occlusion and stenosis of carotid artery without mention of cerebral infarction   . Hypertension, benign     1. OSA will need to be started on CPAP device collar ahead and start her on an auto titrating device so that it will self adjust and make it easier for her.  Patient is not able to go to the sleep lab 2. H/o Stroke continue with supportive care therapy as tolerated.  Patient is doing about the same as far as her eyesight is concerned she is not able to drive 3. Morbid Obesity diet exercise weight loss management 4. Seasonal Allergies controlled supportive care  General Counseling: I have discussed the findings of the evaluation and examination with Jeani.  I have also discussed any further diagnostic evaluation thatmay be needed or ordered today. Zakiyah verbalizes understanding of the findings of todays visit. We also reviewed her medications today and discussed drug interactions and side effects including but not limited excessive drowsiness and altered mental states. We also discussed that there is always a risk not just to her but also people around her. she has been encouraged to call the office with any questions or concerns that should arise related to todays visit.  Orders Placed This Encounter  Procedures  . For home use only DME continuous positive airway pressure (CPAP)    Order Specific Question:   Length of Need     Answer:   Lifetime    Order Specific Question:   Patient has OSA or probable OSA    Answer:   Yes    Order Specific Question:   Is the patient currently using CPAP in the home    Answer:   No    Order Specific Question:   Settings    Answer:   Autotitration    Order Specific Question:   CPAP supplies needed    Answer:   Mask, headgear, cushions, filters, heated tubing and water chamber     Time spent: 77mn  I have personally obtained a history, examined the patient, evaluated laboratory and imaging results, formulated the assessment and plan and placed orders.    SAllyne Gee MD [facility]

## 2019-09-22 NOTE — Patient Instructions (Signed)

## 2019-09-24 ENCOUNTER — Telehealth: Payer: Self-pay

## 2019-09-24 NOTE — Telephone Encounter (Signed)
Gave [facility] orders for new cpap set up and asked if they could do home delivery. Beth

## 2019-09-24 NOTE — Telephone Encounter (Signed)
Left message and asked pt to cal back regarding which DME if se has used any company in the past so we can send her cpap orders to. Eustaquio Maize

## 2019-09-28 ENCOUNTER — Other Ambulatory Visit: Payer: Self-pay

## 2019-09-28 ENCOUNTER — Ambulatory Visit (INDEPENDENT_AMBULATORY_CARE_PROVIDER_SITE_OTHER): Payer: BC Managed Care – PPO | Admitting: Vascular Surgery

## 2019-09-28 ENCOUNTER — Encounter (INDEPENDENT_AMBULATORY_CARE_PROVIDER_SITE_OTHER): Payer: Self-pay | Admitting: Vascular Surgery

## 2019-09-28 VITALS — BP 132/83 | HR 102 | Resp 17 | Ht 64.0 in | Wt 185.0 lb

## 2019-09-28 DIAGNOSIS — E785 Hyperlipidemia, unspecified: Secondary | ICD-10-CM

## 2019-09-28 DIAGNOSIS — I10 Essential (primary) hypertension: Secondary | ICD-10-CM

## 2019-09-28 DIAGNOSIS — I6523 Occlusion and stenosis of bilateral carotid arteries: Secondary | ICD-10-CM | POA: Diagnosis not present

## 2019-09-28 DIAGNOSIS — IMO0002 Reserved for concepts with insufficient information to code with codable children: Secondary | ICD-10-CM

## 2019-09-28 DIAGNOSIS — E1140 Type 2 diabetes mellitus with diabetic neuropathy, unspecified: Secondary | ICD-10-CM | POA: Diagnosis not present

## 2019-09-28 DIAGNOSIS — E1165 Type 2 diabetes mellitus with hyperglycemia: Secondary | ICD-10-CM

## 2019-09-28 NOTE — Progress Notes (Signed)
MRN : [id number]  Meagan [name] is a 68 y.o. (11-Oct-1950) female who presents with chief complaint of  Chief Complaint  Patient presents with  . New Patient (Initial Visit)  .  History of Present Illness:   The patient is seen for evaluation of carotid stenosis. The carotid stenosis was identified after she experienced a stroke. The CVA was posterior. Nevertheless, work-up included a CT angiogram which has demonstrated less than 50% bilateral internal carotid artery stenosis. There is a 60% stenosis of the right external carotid artery. At the time of her CVA the patient was in a hypertensive crisis and complaining of an occipital headache  The patient denies amaurosis fugax. There is no recent history of TIA symptoms or focal motor deficits. There is no prior documented CVA.  She denies further severe headaches since her stroke. There is no history of seizures.  The patient is taking enteric-coated aspirin 81 mg daily.  The patient has a history of coronary artery disease, no recent episodes of angina or shortness of breath. The patient denies PAD or claudication symptoms. There is a history of hyperlipidemia which is being treated with a statin.    Current Meds  Medication Sig  . amLODipine (NORVASC) 5 MG tablet Take 1 tablet (5 mg total) by mouth daily.  Marland Kitchen aspirin 325 MG tablet Take 1 tablet (325 mg total) by mouth daily. Take for 3 months only.  Marland Kitchen atorvastatin (LIPITOR) 80 MG tablet Take 1 tablet (80 mg total) by mouth daily at 6 PM.  . blood glucose meter kit and supplies Dispense based on patient and insurance preference. Use up to four times daily as directed. (FOR ICD-10 E10.9, E11.9).  Marland Kitchen clopidogrel (PLAVIX) 75 MG tablet Take 1 tablet (75 mg total) by mouth daily.  . hydrALAZINE (APRESOLINE) 10 MG tablet Take 1 tablet (10 mg total) by mouth 3 (three) times daily as needed. If bp goes above 150/90  . Insulin Detemir (LEVEMIR FLEXTOUCH) 100 UNIT/ML Pen Inject 25-50 Units  into the skin daily.  . Insulin Pen Needle 32G X 4 MM MISC 1 each by Does not apply route daily.  . metFORMIN (GLUCOPHAGE-XR) 750 MG 24 hr tablet Take 1 tablet (750 mg total) by mouth 2 (two) times daily.  Glory Rosebush ULTRA test strip   . Semaglutide, 1 MG/DOSE, (OZEMPIC, 1 MG/DOSE,) 2 MG/1.5ML SOPN Inject 1 mg into the skin every 7 (seven) days.  . traMADol (ULTRAM) 50 MG tablet Take 1 tablet (50 mg total) by mouth every 6 (six) hours as needed for moderate pain.    Past Medical History:  Diagnosis Date  . Cellulitis    left leg  . 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 Diffuse cystic mastopathy   . Fall 2013  . Hypertension 2004  . Occlusion and stenosis of carotid artery without mention of cerebral infarction   . Sleep apnea     Past Surgical History:  Procedure Laterality Date  . APPENDECTOMY  2000s  . BIOPSY THYROID  2006   [facility]  . BREAST EXCISIONAL BIOPSY Left 2000s   neg  . CARPAL TUNNEL RELEASE Left March 2015  . COLONOSCOPY  2011   DR.WOHL  . COLONOSCOPY WITH PROPOFOL N/A 07/11/2017   Procedure: COLONOSCOPY WITH PROPOFOL;  Surgeon: Jonathon Bellows, MD;  [facility] END[facility]COPY;  Service: Gastroenterology;  Laterality: N/A;  . ganglion cyst removal     . KNEE SURGERY Left 08/2009  . KNEE SURGERY Right  04/07/2010  . ROTATOR CUFF REPAIR Right 2008  . TUBAL LIGATION      Social History Social History   Tobacco Use  . Smoking status: Never Smoker  . Smokeless tobacco: Never Used  Substance Use Topics  . Alcohol use: No  . Drug use: No    Family History Family History  Problem Relation Age of Onset  . Breast cancer Maternal Aunt   . Hypertension Mother   . Kidney Stones Mother   . Hypertension Father   . Congestive Heart Failure Father   . Hypertension Paternal Grandfather   . Diabetes Sister   . Vision loss Sister   . Varicose Veins Sister   . Hypertension Sister   . Breast cancer Sister 45       Double Mastectomy  .  Hypertension Sister   No family history of bleeding/clotting disorders, porphyria or autoimmune disease   Allergies  Allergen Reactions  . Wilder Glade [Dapagliflozin] Rash    Pt reports that she developed a rash when she took [place] and she would like it to be added as an allergy  . Keflex [Cephalexin] Rash     REVIEW OF SYSTEMS (Negative unless checked)  Constitutional: []Weight loss  []Fever  []Chills Cardiac: []Chest pain   []Chest pressure   []Palpitations   []Shortness of breath when laying flat   []Shortness of breath with exertion. Vascular:  []Pain in legs with walking   []Pain in legs at rest  []History of DVT   []Phlebitis   []Swelling in legs   []Varicose veins   []Non-healing ulcers Pulmonary:   []Uses home oxygen   []Productive cough   []Hemoptysis   []Wheeze  []COPD   []Asthma Neurologic:  []Dizziness   []Seizures   [x]History of stroke   []History of TIA  []Aphasia   [x]Vissual changes   []Weakness or numbness in arm   []Weakness or numbness in leg Musculoskeletal:   []Joint swelling   [x]Joint pain   []Low back pain Hematologic:  []Easy bruising  []Easy bleeding   []Hypercoagulable state   []Anemic Gastrointestinal:  []Diarrhea   []Vomiting  [x]Gastroesophageal reflux/heartburn   []Difficulty swallowing. Genitourinary:  []Chronic kidney disease   []Difficult urination  []Frequent urination   []Blood in urine Skin:  []Rashes   []Ulcers  Psychological:  []History of anxiety   [] History of major depression.  Physical Examination  Vitals:   09/28/19 1103  BP: 132/83  Pulse: (!) 102  Resp: 17  Weight: 185 lb (83.9 kg)  Height: 5' 4" (1.626 m)   Body mass index is 31.76 kg/m. Gen: WD/WN, NAD Head: [place]/AT, No temporalis wasting.  Ear/Nose/Throat: Hearing grossly intact, nares w/o erythema or drainage, poor dentition Eyes: PER, EOMI, sclera nonicteric.  Neck: Supple, no masses.  No bruit or JVD.  Pulmonary:  Good air movement, clear to auscultation bilaterally, no use  of accessory muscles.  Cardiac: RRR, normal S1, S2, no Murmurs. Vascular:  Right carotid bruit Vessel Right Left  Radial Palpable Palpable  Carotid Palpable Palpable  Gastrointestinal: soft, non-distended. No guarding/no peritoneal signs.  Musculoskeletal: M/S 5/5 throughout.  No deformity or atrophy.  Neurologic: CN 2-12 intact. Pain and light touch intact in extremities.  Symmetrical.  Speech is fluent. Motor exam as listed above. Psychiatric: Judgment intact, Mood & affect appropriate for pt's clinical situation. Dermatologic: No rashes or ulcers noted.  No changes consistent with cellulitis. Lymph : No Cervical lymphadenopathy, no lichenification or skin changes of chronic lymphedema.  CBC Lab Results  Component Value Date   WBC 10.2 07/06/2019   HGB 13.7 07/06/2019   HCT 38.4 07/06/2019   MCV 87.1 07/06/2019   PLT 245 07/06/2019    BMET    Component Value Date/Time   NA 139 09/18/2019 0000   NA 138 01/31/2016 1115   K 3.7 09/18/2019 0000   CL 97 (L) 09/18/2019 0000   CO2 29 09/18/2019 0000   GLUCOSE 95 09/18/2019 0000   BUN 17 09/18/2019 0000   BUN 10 01/31/2016 1115   CREATININE 0.99 09/18/2019 0000   CALCIUM 9.8 09/18/2019 0000   GFRNONAA 59 (L) 09/18/2019 0000   GFRAA 69 09/18/2019 0000   Estimated Creatinine Clearance: 58.6 mL/min (by C-G formula based on SCr of 0.99 mg/dL).  COAG Lab Results  Component Value Date   INR 1.1 04/21/2009    Radiology I have personally reviewed the CT angiogram of the carotid arteries dated 07/02/2019 and concur with the impression per the radiologist   Assessment/Plan 1. Bilateral carotid artery stenosis Recommend:  Given the patient's asymptomatic subcritical stenosis no further invasive testing or surgery at this time.  Duplex ultrasound shows <50% ICA stenosis bilaterally.  Continue antiplatelet therapy as prescribed Continue management of CAD, HTN and Hyperlipidemia Healthy heart diet,  encouraged exercise at  least 4 times per week Follow up in 6 months with duplex ultrasound and physical exam  - Carotid; Future  2. Hypertension, benign Continue antihypertensive medications as already ordered, these medications have been reviewed and there are no changes at this time.   3. Type 2 diabetes, uncontrolled, with neuropathy ([place]) Continue hypoglycemic medications as already ordered, these medications have been reviewed and there are no changes at this time.  Hgb A1C to be monitored as already arranged by primary service   4. Dyslipidemia Continue statin as ordered and reviewed, no changes at this time    Hortencia Pilar, MD  09/28/2019 11:17 AM

## 2019-09-30 ENCOUNTER — Ambulatory Visit: Payer: BC Managed Care – PPO | Admitting: Family Medicine

## 2019-10-04 ENCOUNTER — Emergency Department (HOSPITAL_COMMUNITY): Payer: BC Managed Care – PPO

## 2019-10-04 ENCOUNTER — Emergency Department (HOSPITAL_COMMUNITY)
Admission: EM | Admit: 2019-10-04 | Discharge: 2019-10-04 | Disposition: A | Discharge status: Home / Self Care | Payer: BC Managed Care – PPO | Attending: Emergency Medicine | Admitting: Emergency Medicine

## 2019-10-04 ENCOUNTER — Other Ambulatory Visit: Payer: Self-pay

## 2019-10-04 ENCOUNTER — Encounter (HOSPITAL_COMMUNITY): Payer: Self-pay | Admitting: Emergency Medicine

## 2019-10-04 DIAGNOSIS — Z79899 Other long term (current) drug therapy: Secondary | ICD-10-CM | POA: Diagnosis not present

## 2019-10-04 DIAGNOSIS — R630 Anorexia: Secondary | ICD-10-CM | POA: Diagnosis not present

## 2019-10-04 DIAGNOSIS — Z7982 Long term (current) use of aspirin: Secondary | ICD-10-CM | POA: Diagnosis not present

## 2019-10-04 DIAGNOSIS — Z8673 Personal history of transient ischemic attack (TIA), and cerebral infarction without residual deficits: Secondary | ICD-10-CM | POA: Diagnosis not present

## 2019-10-04 DIAGNOSIS — I10 Essential (primary) hypertension: Secondary | ICD-10-CM | POA: Insufficient documentation

## 2019-10-04 DIAGNOSIS — E119 Type 2 diabetes mellitus without complications: Secondary | ICD-10-CM | POA: Diagnosis not present

## 2019-10-04 DIAGNOSIS — R55 Syncope and collapse: Secondary | ICD-10-CM | POA: Diagnosis not present

## 2019-10-04 DIAGNOSIS — Z794 Long term (current) use of insulin: Secondary | ICD-10-CM | POA: Insufficient documentation

## 2019-10-04 HISTORY — DX: Cerebral infarction, unspecified: I63.9

## 2019-10-04 LAB — CBC
HCT: 35.6 % — ABNORMAL LOW (ref 36.0–46.0)
Hemoglobin: 12.5 g/dL (ref 12.0–15.0)
MCH: 31.3 pg (ref 26.0–34.0)
MCHC: 35.1 g/dL (ref 30.0–36.0)
MCV: 89.2 fL (ref 80.0–100.0)
Platelets: 385 10*3/uL (ref 150–400)
RBC: 3.99 MIL/uL (ref 3.87–5.11)
RDW: 13.5 % (ref 11.5–15.5)
WBC: 9.2 10*3/uL (ref 4.0–10.5)
nRBC: 0 % (ref 0.0–0.2)

## 2019-10-04 LAB — URINALYSIS, ROUTINE W REFLEX MICROSCOPIC
Bilirubin Urine: NEGATIVE
Glucose, UA: NEGATIVE mg/dL
Hgb urine dipstick: NEGATIVE
Ketones, ur: 20 mg/dL — AB
Nitrite: NEGATIVE
Protein, ur: 30 mg/dL — AB
Specific Gravity, Urine: 1.008 (ref 1.005–1.030)
pH: 5 (ref 5.0–8.0)

## 2019-10-04 LAB — BASIC METABOLIC PANEL
Anion gap: 15 (ref 5–15)
BUN: 13 mg/dL (ref 8–23)
CO2: 26 mmol/L (ref 22–32)
Calcium: 9.1 mg/dL (ref 8.9–10.3)
Chloride: 95 mmol/L — ABNORMAL LOW (ref 98–111)
Creatinine, Ser: 1.21 mg/dL — ABNORMAL HIGH (ref 0.44–1.00)
GFR calc Af Amer: 54 mL/min — ABNORMAL LOW (ref >60)
GFR calc non Af Amer: 47 mL/min — ABNORMAL LOW (ref >60)
Glucose, Bld: 121 mg/dL — ABNORMAL HIGH (ref 70–99)
Potassium: 3.2 mmol/L — ABNORMAL LOW (ref 3.5–5.1)
Sodium: 136 mmol/L (ref 135–145)

## 2019-10-04 LAB — MAGNESIUM: Magnesium: 1.4 mg/dL — ABNORMAL LOW (ref 1.7–2.4)

## 2019-10-04 MED ORDER — POTASSIUM CHLORIDE CRYS ER 20 MEQ PO TBCR
30.0000 meq | EXTENDED_RELEASE_TABLET | Freq: Once | ORAL | Status: AC
  Administered 2019-10-04: 30 meq via ORAL
  Filled 2019-10-04: qty 1

## 2019-10-04 MED ORDER — SODIUM CHLORIDE 0.9% FLUSH: 3.0000 mL | Freq: Once | INTRAVENOUS | Status: DC

## 2019-10-04 MED ORDER — SODIUM CHLORIDE 0.9 % IV BOLUS
1000.0000 mL | Freq: Once | INTRAVENOUS | Status: AC
  Administered 2019-10-04: 1000 mL via INTRAVENOUS

## 2019-10-04 MED ORDER — MAGNESIUM SULFATE 2 GM/50ML IV SOLN
2.0000 g | Freq: Once | INTRAVENOUS | Status: AC
  Administered 2019-10-04: 2 g via INTRAVENOUS
  Filled 2019-10-04: qty 50

## 2019-10-04 NOTE — ED Triage Notes (Signed)
Pt to triage via [facility].  Staying with daughter during holidays.  C/o nausea and dizziness.  Pt had near syncopal episode this morning.  Reports stroke 3 months ago.  CBG 130.

## 2019-10-04 NOTE — ED Notes (Signed)
Pt pivoted well with standby assist to bsc. PT weak on R side from previous stroke.

## 2019-10-04 NOTE — ED Provider Notes (Signed)
I assumed care of patient from previous team at shift change, please see their note for full H&P.  Briefly patient is here for evaluation of the near syncopal event.  She had a BM and then after got up to walk to the table, she started feeling flushed, and lightheaded.  She did stumble however reportedly never fully lost consciousness and did not fall.  She feels generally weak now however is otherwise okay.    No chest pain, nausea, or vomiting.  She did have a previous CVA and since then she has had poor p.o. intake. Physical Exam  BP 122/75 (BP Location: Right Arm)   Pulse 97   Temp 98.2 F (36.8 C) (Oral)   Resp 14   SpO2 99%   Physical Exam Vitals and nursing note reviewed.  Constitutional:      General: She is not in acute distress.    Appearance: She is well-developed. She is not diaphoretic.  HENT:     Head: Normocephalic and atraumatic.  Eyes:     General: No scleral icterus.       Right eye: No discharge.        Left eye: No discharge.     Conjunctiva/sclera: Conjunctivae normal.  Cardiovascular:     Rate and Rhythm: Normal rate and regular rhythm.  Pulmonary:     Effort: Pulmonary effort is normal. No respiratory distress.     Breath sounds: No stridor.  Abdominal:     General: There is no distension.  Musculoskeletal:        General: No deformity.     Cervical back: Normal range of motion.  Skin:    General: Skin is warm and dry.  Neurological:     Mental Status: She is alert.     Motor: No abnormal muscle tone.     Comments: Patient is awake and alert, able to provide history without difficulty.  Speech is not slurred.  Psychiatric:     Comments: Occasionally tearful     ED Course/Procedures   Clinical Course as of Oct 04 2323  Sun Oct 04, 2019  2158 Patient reevaluated, she reports that she feels much better than she has recently.  She was able to get up and use the bedside commode with 1 person assist which she states is her baseline with the residual  weakness from her stroke.  She wishes for discharge home at this time.   [EH]    Clinical Course User Index [EH] Lorin Glass, PA-C    Procedures   Labs Reviewed  BASIC METABOLIC PANEL - Abnormal; Notable for the following components:      Result Value   Potassium 3.2 (*)    Chloride 95 (*)    Glucose, Bld 121 (*)    Creatinine, Ser 1.21 (*)    GFR calc non Af Amer 47 (*)    GFR calc Af Amer 54 (*)    All other components within normal limits  CBC - Abnormal; Notable for the following components:   HCT 35.6 (*)    All other components within normal limits  URINALYSIS, ROUTINE W REFLEX MICROSCOPIC - Abnormal; Notable for the following components:   Ketones, ur 20 (*)    Protein, ur 30 (*)    Leukocytes,Ua SMALL (*)    Bacteria, UA RARE (*)    All other components within normal limits  MAGNESIUM - Abnormal; Notable for the following components:   Magnesium 1.4 (*)    All other components  within normal limits  CBG MONITORING, ED    Orthostatic VS for the past 24 hrs:  BP- Lying Pulse- Lying BP- Sitting Pulse- Sitting BP- Standing at 0 minutes Pulse- Standing at 0 minutes  10/04/19 1600 -- -- -- -- 112/82 90  10/04/19 1559 -- -- 128/80 92 -- --  10/04/19 1557 135/85 96 -- -- -- --      MDM  Plan to follow up on UA, replace K, ordered mg level.  Plan to check orthostatics, hydrate. No full LOC.   Patient was given IV fluids while in the emergency room.  Her potassium was low at 3.2 which was orally repleted.  Magnesium level is low and IV repleted.  UA without clear evidence of infection.  Patient reported after IV fluids that she felt much better.  She was not significantly orthostatic prior to IV fluids.  She was able to nibble on some food while she was here.  We discussed the importance of maintaining adequate oral intake as I suspect that her near syncopal event was a comminution of dehydration, not eating much due to poor appetite, and vagal event from being  on the toilet.    She is able to stand and walk to the bedside commode with 1 person assist which she states is her baseline.  I also recommended patient consider counseling given the significant impact that the stroke is had on her life and close PCP follow-up due to poor p.o. intake.  Patient remained hemodynamically stable while in my care.  Return precautions were discussed with patient who states their understanding.  At the time of discharge patient denied any unaddressed complaints or concerns.  Patient is agreeable for discharge home.  Note: Portions of this report may have been transcribed using voice recognition software. Every effort was made to ensure accuracy; however, inadvertent computerized transcription errors may be present       Lorin Glass, PA-C 10/04/19 2328    Lennice Sites, DO 10/05/19 0021

## 2019-10-04 NOTE — ED Notes (Signed)
Pt eating snack and sandwich, taking fluids without difficulty.

## 2019-10-04 NOTE — ED Notes (Signed)
Discharge instructions discussed with pt. Pt verbalized understanding. Pt stable and ambulatory. No signature pad available. 

## 2019-10-04 NOTE — ED Provider Notes (Signed)
[place] EMERGENCY DEPARTMENT Provider Note   CSN: [id number] Arrival date & time: 10/04/19  1150     History Chief Complaint  Patient presents with  . Near Syncope    Meagan [name] is a 68 y.o. female with PMH/o DM, HTN, CVA (with left visual deficits) who presents for evaluation of near syncopal episode that occurred just prior to ED arrival. Patient reports that she went to sit at the dining room table after having a bowel movement when she started feeling flushed and lightheaded. She felt like she was going to pass out and asked her daughter to help her to the couch. She attempted to get there herself but felt generalized weakness and lightheaded. Her daughter was able to get her to the couch. Patient did not have LOC. She states she did not have any CP at that time. On ED arrival, she states lightheaded sensation has resolved but she still feels generalized weakness. No focal weakness. She states she had a CVA in September 2020 and since then she has not really had much of an appetite. She only eats a small amount of certain things throughout the day. This morning, all she had was an ensure shake and some fruit. Patient states she has not had any recent fevers, chills. She denies any CP, abdominal pain, nausea/vomiting, SOB, urinary complaints. She denies any recent travel or known COVID 19 exposure.   The history is provided by the patient.       Past Medical History:  Diagnosis Date  . Cellulitis    left leg  . 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 Diffuse cystic mastopathy   . Fall 2013  . Hypertension 2004  . Occlusion and stenosis of carotid artery without mention of cerebral infarction   . Sleep apnea   . Stroke [facility])     Patient Active Problem List   Diagnosis Date Noted  . History of CVA with residual deficit 07/01/2019  . Leukocytosis 07/01/2019  . Polycythemia 07/01/2019  . Morbid obesity ([place]) 10/22/2018    . Non compliance with medical treatment 03/26/2018  . Dyslipidemia associated with type 2 diabetes mellitus ([place]) 12/04/2016  . Uncontrolled type 2 diabetes mellitus with microalbuminuria, without long-term current use of insulin ([place]) 12/04/2016  . Seasonal allergies 02/01/2016  . OSA (obstructive sleep apnea) 10/06/2015  . Hypertensive retinopathy 10/06/2015  . Thyroid cyst 10/06/2015  . Dyslipidemia 10/06/2015  . Type 2 diabetes, uncontrolled, with neuropathy ([place]) 10/06/2015  . History of pneumonia 10/06/2015  . Chronic neck pain 10/06/2015  . Carotid stenosis   . Hypertension, benign     Past Surgical History:  Procedure Laterality Date  . APPENDECTOMY  2000s  . BIOPSY THYROID  2006   [facility]  . BREAST EXCISIONAL BIOPSY Left 2000s   neg  . CARPAL TUNNEL RELEASE Left March 2015  . COLONOSCOPY  2011   DR.WOHL  . COLONOSCOPY WITH PROPOFOL N/A 07/11/2017   Procedure: COLONOSCOPY WITH PROPOFOL;  Surgeon: Jonathon Bellows, MD;  Location: [facility] ENDOSCOPY;  Service: Gastroenterology;  Laterality: N/A;  . ganglion cyst removal     . KNEE SURGERY Left 08/2009  . KNEE SURGERY Right 04/07/2010  . ROTATOR CUFF REPAIR Right 2008  . TUBAL LIGATION       OB History    Gravida  4   Para  4   Term      Preterm      AB  Living  4     SAB      TAB      Ectopic      Multiple      Live Births           Obstetric Comments  First pregnancy 38 First menstrual 13        Family History  Problem Relation Age of Onset  . Breast cancer Maternal Aunt   . Hypertension Mother   . Kidney Stones Mother   . Hypertension Father   . Congestive Heart Failure Father   . Hypertension Paternal Grandfather   . Diabetes Sister   . Vision loss Sister   . Varicose Veins Sister   . Hypertension Sister   . Breast cancer Sister 57       Double Mastectomy  . Hypertension Sister     Social History   Tobacco Use  . Smoking status: Never Smoker  . Smokeless tobacco: Never Used   Substance Use Topics  . Alcohol use: No  . Drug use: No    Home Medications Prior to Admission medications   Medication Sig Start Date End Date Taking? Authorizing Provider  amLODipine (NORVASC) 5 MG tablet Take 1 tablet (5 mg total) by mouth daily. 07/31/19   Steele Sizer, MD  aspirin 325 MG tablet Take 1 tablet (325 mg total) by mouth daily. Take for 3 months only. 07/08/19 10/06/19  Mariel Aloe, MD  atorvastatin (LIPITOR) 80 MG tablet Take 1 tablet (80 mg total) by mouth daily at 6 PM. 07/31/19   Steele Sizer, MD  blood glucose meter kit and supplies Dispense based on patient and insurance preference. Use up to four times daily as directed. (FOR ICD-10 E10.9, E11.9). 07/31/19   Steele Sizer, MD  clopidogrel (PLAVIX) 75 MG tablet Take 1 tablet (75 mg total) by mouth daily. 07/31/19   Steele Sizer, MD  hydrALAZINE (APRESOLINE) 10 MG tablet Take 1 tablet (10 mg total) by mouth 3 (three) times daily as needed. If bp goes above 150/90 07/24/19   Steele Sizer, MD  Insulin Detemir (LEVEMIR FLEXTOUCH) 100 UNIT/ML Pen Inject 25-50 Units into the skin daily. 07/29/19   Steele Sizer, MD  Insulin Pen Needle 32G X 4 MM MISC 1 each by Does not apply route daily. 07/30/19   Steele Sizer, MD  levocetirizine (XYZAL) 5 MG tablet Take 5 mg by mouth as needed for allergies.    [provider]  metFORMIN (GLUCOPHAGE-XR) 750 MG 24 hr tablet Take 1 tablet (750 mg total) by mouth 2 (two) times daily. 07/24/19   Steele Sizer, MD  olmesartan-hydrochlorothiazide (BENICAR HCT) 40-25 MG tablet Take 1 tablet by mouth daily. Patient not taking: Reported on 09/28/2019 07/24/19   Steele Sizer, MD  [facility] ULTRA test strip  09/15/19   [provider]  polyethylene glycol (MIRALAX / GLYCOLAX) 17 g packet Take 17 g by mouth daily. Patient not taking: Reported on 09/18/2019 07/08/19   Mariel Aloe, MD  Semaglutide, 1 MG/DOSE, (OZEMPIC, 1 MG/DOSE,) 2 MG/1.5ML SOPN Inject 1 mg  into the skin every 7 (seven) days. 07/24/19   Steele Sizer, MD  traMADol (ULTRAM) 50 MG tablet Take 1 tablet (50 mg total) by mouth every 6 (six) hours as needed for moderate pain. 07/08/19   Mariel Aloe, MD    Allergies    Farxiga [dapagliflozin] and Keflex [cephalexin]  Review of Systems   Review of Systems  Constitutional: Negative for fever.  Respiratory: Negative for cough and  shortness of breath.   Cardiovascular: Negative for chest pain.  Gastrointestinal: Negative for abdominal pain, nausea and vomiting.  Genitourinary: Negative for dysuria and hematuria.  Neurological: Positive for weakness (generalized) and light-headedness. Negative for headaches.  All other systems reviewed and are negative.   Physical Exam Updated Vital Signs BP 122/75 (BP Location: Right Arm)   Pulse 97   Temp 98.2 F (36.8 C) (Oral)   Resp 14   SpO2 99%   Physical Exam Vitals and nursing note reviewed.  Constitutional:      Appearance: Normal appearance. She is well-developed.  HENT:     Head: Normocephalic and atraumatic.  Eyes:     General: Lids are normal.     Conjunctiva/sclera: Conjunctivae normal.     Pupils: Pupils are equal, round, and reactive to light.     Comments: PERRL. EOMs intact. No nystagmus. No neglect.   Cardiovascular:     Rate and Rhythm: Normal rate and regular rhythm.     Pulses: Normal pulses.          Radial pulses are 2+ on the right side and 2+ on the left side.       Dorsalis pedis pulses are 2+ on the right side and 2+ on the left side.     Heart sounds: Normal heart sounds. No murmur. No friction rub. No gallop.   Pulmonary:     Effort: Pulmonary effort is normal.     Breath sounds: Normal breath sounds.     Comments: Lungs clear to auscultation bilaterally.  Symmetric chest rise.  No wheezing, rales, rhonchi. Abdominal:     Palpations: Abdomen is soft. Abdomen is not rigid.     Tenderness: There is no abdominal tenderness. There is no guarding.      Comments: Abdomen is soft, non-distended, non-tender. No rigidity, No guarding. No peritoneal signs.  Musculoskeletal:        General: Normal range of motion.     Cervical back: Full passive range of motion without pain.  Skin:    General: Skin is warm and dry.     Capillary Refill: Capillary refill takes less than 2 seconds.  Neurological:     Mental Status: She is alert and oriented to person, place, and time.     Comments: Cranial nerves III-XII intact Follows commands, Moves all extremities  5/5 strength to BUE and BLE  Sensation intact throughout all major nerve distributions No pronator drift. No slurred speech. No facial droop.   Psychiatric:        Speech: Speech normal.     ED Results / Procedures / Treatments   Labs (all labs ordered are listed, but only abnormal results are displayed) Labs Reviewed  BASIC METABOLIC PANEL - Abnormal; Notable for the following components:      Result Value   Potassium 3.2 (*)    Chloride 95 (*)    Glucose, Bld 121 (*)    Creatinine, Ser 1.21 (*)    GFR calc non Af Amer 47 (*)    GFR calc Af Amer 54 (*)    All other components within normal limits  CBC - Abnormal; Notable for the following components:   HCT 35.6 (*)    All other components within normal limits  URINALYSIS, ROUTINE W REFLEX MICROSCOPIC  MAGNESIUM  CBG MONITORING, ED    EKG EKG Interpretation  Date/Time:  Sunday October 04 2019 12:04:35 EST Ventricular Rate:  102 PR Interval:  142 QRS Duration: 88 QT Interval:  372 QTC Calculation: 484 R Axis:   64 Text Interpretation: Sinus tachycardia Otherwise normal ECG Confirmed by Lajean Saver [phone]) on 10/04/2019 1:22:24 PM   Radiology No results found.  Procedures Procedures (including critical care time)  Medications Ordered in ED Medications  sodium chloride flush (NS) 0.9 % injection 3 mL (has no administration in time range)  sodium chloride 0.9 % bolus 1,000 mL (has no administration in time range)   potassium chloride (KLOR-CON) CR tablet 30 mEq (has no administration in time range)    ED Course  I have reviewed the triage vital signs and the nursing notes.  Pertinent labs & imaging results that were available during my care of the patient were reviewed by me and considered in my medical decision making (see chart for details).    MDM Rules/Calculators/A&P                      68 year old female past medical history of diabetes, CVA who presents today for evaluation of near syncopal episode that occurred just prior to ED arrival.  Patient reports that she felt lightheaded like she was going to pass out but no chest pain.  She never lost consciousness.  She does report that since her stroke about 3 months ago, she has had difficulty with appetite.  She is not eating much.  Initially arrival, she is afebrile, nontoxic-appearing.  She has a soft blood pressures but otherwise vitals stable.  On exam, no neuro deficits. Doubt CVA, cardiac etiology. Question if this is due to orthostatic hypotension or if she had a vagal episodes after bowel movement.   Plan to check orthostatics, labs.  BMP shows potassium 3.2, BUN of 13, creatinine 1.21.  CBC is unremarkable.  Patient signed out to Wyn Quaker, PA-C with orthostatic vitals and re-assment pending.   No data found.  Portions of this note were generated with [employer]. Dictation errors may occur despite best attempts at proofreading.    Final Clinical Impression(s) / ED Diagnoses Final diagnoses:  Near syncope    Rx / DC Orders ED Discharge Orders    None       Desma Mcgregor 10/04/19 1538    Lajean Saver, MD 10/12/19 [phone]

## 2019-10-04 NOTE — Discharge Instructions (Addendum)
I would recommend that you take a multivitamin every day.  Please make sure you are drinking plenty of fluids and staying well-hydrated.   Please call your primary care doctor to discuss possible options to help with your appetite. As we discussed I also think that you may benefit from having a counselor as a stroke can be a life-changing event.

## 2019-10-05 ENCOUNTER — Telehealth: Payer: Self-pay

## 2019-10-05 NOTE — Telephone Encounter (Signed)
Copied from [place] [id number]. Topic: General - Other >> Oct 05, 2019 11:14 AM Lennox Solders wrote: Reason for CRM: pt sister nellie is  calling and would like to talk with dr Ancil Boozer nurse concerning her sister  went to [place] yesterday and was dehydrated and mild nourish and the sister has a few question  Yesterday ambulance was called for dehydration and malnutrition. She was discharged and told they she was dying because she is not eating enough. She has lost more weight down to 177.6. Sister has some concerns about her bs. She did not take her medication because she was sent to hospital. Her bs was checked this morning 125 and she was not fasting. Does she still need to continue giving her insulin with her not eating properly. She has not given her the insulin, but plans to give it to her before she goes to bed. She was told to start taking multivitamin and stop taking aspirin 325 mg, should she take 81 mg of aspirin instead. Is there anything that she can take for nausea. She has admitted that she is depressed and would like you to recommend a counselor. Her sister thinks that something mentally is going on with her not eating and wants some recommendations.

## 2019-10-06 NOTE — Telephone Encounter (Signed)
Will address multiple concerns at follow up that is scheduled for tomorrow.

## 2019-10-06 NOTE — Telephone Encounter (Signed)
Spoke with patient and sister on today.  Patient is scheduled with Raquel Sarna on 09/16/2019

## 2019-10-07 ENCOUNTER — Encounter: Payer: Self-pay | Admitting: Family Medicine

## 2019-10-07 ENCOUNTER — Ambulatory Visit (INDEPENDENT_AMBULATORY_CARE_PROVIDER_SITE_OTHER): Payer: BC Managed Care – PPO | Admitting: Family Medicine

## 2019-10-07 ENCOUNTER — Other Ambulatory Visit: Payer: Self-pay

## 2019-10-07 DIAGNOSIS — I6930 Unspecified sequelae of cerebral infarction: Secondary | ICD-10-CM | POA: Diagnosis not present

## 2019-10-07 DIAGNOSIS — R630 Anorexia: Secondary | ICD-10-CM

## 2019-10-07 DIAGNOSIS — E1129 Type 2 diabetes mellitus with other diabetic kidney complication: Secondary | ICD-10-CM | POA: Diagnosis not present

## 2019-10-07 DIAGNOSIS — E1165 Type 2 diabetes mellitus with hyperglycemia: Secondary | ICD-10-CM | POA: Diagnosis not present

## 2019-10-07 DIAGNOSIS — R432 Parageusia: Secondary | ICD-10-CM

## 2019-10-07 DIAGNOSIS — E876 Hypokalemia: Secondary | ICD-10-CM

## 2019-10-07 DIAGNOSIS — IMO0002 Reserved for concepts with insufficient information to code with codable children: Secondary | ICD-10-CM

## 2019-10-07 NOTE — Progress Notes (Signed)
Name: Meagan [name]   MRN: [id number]    DOB: [birth year]   Date:10/07/2019       Progress Note  Subjective  Chief Complaint  Chief Complaint  Patient presents with  . Follow-up    ER recheck   . Dehydration    malnutrition    I connected with  Meagan [name]  on 10/07/19 at 11:20 AM EST by a video enabled telemedicine application and verified that I am speaking with the correct person using two identifiers.  I discussed the limitations of evaluation and management by telemedicine and the availability of in person appointments. The patient expressed understanding and agreed to proceed. Staff also discussed with the patient that there may be a patient responsible charge related to this service. Patient Location: Home Provider Location: Home Additional Individuals present: Sister - Nellie  HPI  Pt presents for ER follow up - was seen 10/04/2019 for near syncope and decreased appetite.  She reports on 10/04/2019 that she had near syncopal episode at home, was taken to ER for evaluation.  She was given IV fluids and PO Potassium and IV Mag.  She has not had any near-syncopal episodes since then.  Low appetite/Hx stroke: She notes this has been an ongoing issue for her for a few years.  We will check CMP to look   This started around the same time as her stroke in September 23.  She was on Ozempic prior to this without issue.  She notes that her taste has been disturbed since her stroke, but no issue with chewing/swallowing, no choking.  DM: Taking ozempic, and Levemir and is very compliant.  She has decreased her levemir by 2 units every 2-3 days per her last visit with Dr. Ancil Boozer- she is currently at 15 units. Of note the day she had 80 FBS was the day she had near syncope - I discussed monitoring for BG's in the 80's and if this occurs, make sure to provide a high protein breakfast with at least 15g carbohydrates to stabilize.   Last 10 days of fasting blood sugars: 106, 108, 125, 80, 102, 105,  101, 100, 104, 99.  Labs reviewed: BMP - mild hypokalemia at 3.2, Cr 1.21, GFR 54, chloride 95. K, Na, BUN, Ca WNL Magnesium: 1.4 CBC - HCT just slightly below normal, otherwise WNL - Will order CMP and magnesium to ensure stability/improvement. Patient will come in tomorrow afternoon for repeat labs - does not have ride today.  Patient Active Problem List   Diagnosis Date Noted  . History of CVA with residual deficit 07/01/2019  . Leukocytosis 07/01/2019  . Polycythemia 07/01/2019  . Morbid obesity ([name]) 10/22/2018  . Non compliance with medical treatment 03/26/2018  . Dyslipidemia associated with type 2 diabetes mellitus ([name]) 12/04/2016  . Uncontrolled type 2 diabetes mellitus with microalbuminuria, without long-term current use of insulin ([name]) 12/04/2016  . Seasonal allergies 02/01/2016  . OSA (obstructive sleep apnea) 10/06/2015  . Hypertensive retinopathy 10/06/2015  . Thyroid cyst 10/06/2015  . Dyslipidemia 10/06/2015  . Type 2 diabetes, uncontrolled, with neuropathy ([name]) 10/06/2015  . History of pneumonia 10/06/2015  . Chronic neck pain 10/06/2015  . Carotid stenosis   . Hypertension, benign     Past Surgical History:  Procedure Laterality Date  . APPENDECTOMY  2000s  . BIOPSY THYROID  2006   [facility]  . BREAST EXCISIONAL BIOPSY Left 2000s   neg  . CARPAL TUNNEL RELEASE Left March 2015  . COLONOSCOPY  2011  DR.WOHL  . COLONOSCOPY WITH PROPOFOL N/A 07/11/2017   Procedure: COLONOSCOPY WITH PROPOFOL;  Surgeon: Jonathon Bellows, MD;  Location: [facility] ENDOSCOPY;  Service: Gastroenterology;  Laterality: N/A;  . ganglion cyst removal     . KNEE SURGERY Left 08/2009  . KNEE SURGERY Right 04/07/2010  . ROTATOR CUFF REPAIR Right 2008  . TUBAL LIGATION      Family History  Problem Relation Age of Onset  . Breast cancer Maternal Aunt   . Hypertension Mother   . Kidney Stones Mother   . Hypertension Father   . Congestive Heart Failure Father   . Hypertension Paternal  Grandfather   . Diabetes Sister   . Vision loss Sister   . Varicose Veins Sister   . Hypertension Sister   . Breast cancer Sister 23       Double Mastectomy  . Hypertension Sister     Social History   Socioeconomic History  . Marital status: Widowed    Spouse name: Not on file  . Number of children: 4  . Years of education: Not on file  . Highest education level: Master's degree (e.g., MA, MS, MEng, MEd, MSW, MBA)  Occupational History  . Occupation: Higher education careers adviser   Tobacco Use  . Smoking status: Never Smoker  . Smokeless tobacco: Never Used  Substance and Sexual Activity  . Alcohol use: No  . Drug use: No  . Sexual activity: Not Currently    Partners: Male    Comment: Widow for 14 years  Other Topics Concern  . Not on file  Social History Narrative   She had a stroke on 06/2019, she has left peripheral vision loss since stroke   She moved in with her youngest sister    Social Determinants of Health   Financial Resource Strain:   . Difficulty of Paying Living Expenses: Not on file  Food Insecurity:   . Worried About Charity fundraiser in the Last Year: Not on file  . Ran Out of Food in the Last Year: Not on file  Transportation Needs:   . Lack of Transportation (Medical): Not on file  . Lack of Transportation (Non-Medical): Not on file  Physical Activity: Inactive  . Days of Exercise per Week: 0 days  . Minutes of Exercise per Session: 0 min  Stress:   . Feeling of Stress : Not on file  Social Connections:   . Frequency of Communication with Friends and Family: Not on file  . Frequency of Social Gatherings with Friends and Family: Not on file  . Attends Religious Services: Not on file  . Active Member of Clubs or Organizations: Not on file  . Attends Archivist Meetings: Not on file  . Marital Status: Not on file  Intimate Partner Violence:   . Fear of Current or Ex-Partner: Not on file  . Emotionally Abused: Not on file  . Physically Abused: Not  on file  . Sexually Abused: Not on file     Current Outpatient Medications:  .  amLODipine (NORVASC) 5 MG tablet, Take 1 tablet (5 mg total) by mouth daily., Disp: 90 tablet, Rfl: 1 .  atorvastatin (LIPITOR) 80 MG tablet, Take 1 tablet (80 mg total) by mouth daily at 6 PM., Disp: 90 tablet, Rfl: 1 .  blood glucose meter kit and supplies, Dispense based on patient and insurance preference. Use up to four times daily as directed. (FOR ICD-10 E10.9, E11.9)., Disp: 1 each, Rfl: 0 .  clopidogrel (PLAVIX) 75  MG tablet, Take 1 tablet (75 mg total) by mouth daily., Disp: 90 tablet, Rfl: 1 .  fluticasone (FLONASE) 50 MCG/ACT nasal spray, Place 1 spray into both nostrils daily as needed for allergies or rhinitis., Disp: , Rfl:  .  hydrALAZINE (APRESOLINE) 10 MG tablet, Take 1 tablet (10 mg total) by mouth 3 (three) times daily as needed. If bp goes above 150/90 (Patient taking differently: Take 10 mg by mouth 3 (three) times daily as needed ("if B/P goes above 150/90"). ), Disp: 30 tablet, Rfl: 0 .  Insulin Detemir (LEVEMIR FLEXTOUCH) 100 UNIT/ML Pen, Inject 25-50 Units into the skin daily. (Patient taking differently: Inject 17 Units into the skin daily after breakfast. ), Disp: 15 mL, Rfl: 2 .  Insulin Pen Needle 32G X 4 MM MISC, 1 each by Does not apply route daily., Disp: 100 each, Rfl: 11 .  levocetirizine (XYZAL) 5 MG tablet, Take 5 mg by mouth daily as needed for allergies. , Disp: , Rfl:  .  metFORMIN (GLUCOPHAGE-XR) 750 MG 24 hr tablet, Take 1 tablet (750 mg total) by mouth 2 (two) times daily., Disp: 180 tablet, Rfl: 1 .  olmesartan-hydrochlorothiazide (BENICAR HCT) 40-25 MG tablet, Take 1 tablet by mouth daily., Disp: 90 tablet, Rfl: 0 .  ONETOUCH ULTRA test strip, , Disp: , Rfl:  .  polyethylene glycol (MIRALAX / GLYCOLAX) 17 g packet, Take 17 g by mouth daily., Disp: 14 each, Rfl: 0 .  Semaglutide, 1 MG/DOSE, (OZEMPIC, 1 MG/DOSE,) 2 MG/1.5ML SOPN, Inject 1 mg into the skin every 7 (seven) days.  (Patient taking differently: Inject 1 mg into the skin every Friday. ), Disp: 9 mL, Rfl: 2 .  traMADol (ULTRAM) 50 MG tablet, Take 1 tablet (50 mg total) by mouth every 6 (six) hours as needed for moderate pain., Disp: 10 tablet, Rfl: 0  Allergies  Allergen Reactions  . Bee Venom Swelling and Other (See Comments)    Immediately takes Benadryl to counteract  . [place] [Dapagliflozin] Rash    Pt reports that she developed a rash when she took [place] and she would like it to be added as an allergy  . Keflex [Cephalexin] Rash    I personally reviewed active problem list, medication list, allergies, notes from last encounter, lab results with the patient/caregiver today.  ROS Ten systems reviewed and is negative except as mentioned in HPI  Objective  Virtual encounter, vitals not obtained.  There is no height or weight on file to calculate BMI.  Physical Exam  Pulmonary/Chest: Effort normal. No respiratory distress. Speaking in complete sentences Neurological: Pt is alert and oriented to person, place, and time. Speech is normal Psychiatric: Patient has a normal mood and affect. behavior is normal. Judgment and thought content normal.  Results for orders placed or performed during the hospital encounter of 10/04/19 (from the past 72 hour(s))  Basic metabolic panel     Status: Abnormal   Collection Time: 10/04/19 11:58 AM  Result Value Ref Range   Sodium 136 135 - 145 mmol/L   Potassium 3.2 (L) 3.5 - 5.1 mmol/L   Chloride 95 (L) 98 - 111 mmol/L   CO2 26 22 - 32 mmol/L   Glucose, Bld 121 (H) 70 - 99 mg/dL   BUN 13 8 - 23 mg/dL   Creatinine, Ser 1.21 (H) 0.44 - 1.00 mg/dL   Calcium 9.1 8.9 - 10.3 mg/dL   GFR calc non Af Amer 47 (L) >60 mL/min   GFR calc Af Amer 54 (  L) >60 mL/min   Anion gap 15 5 - 15    Comment: Performed at [facility] [address] [place], [place] [address]  CBC     Status: Abnormal   Collection Time: 10/04/19 11:58 AM  Result Value Ref Range    WBC 9.2 4.0 - 10.5 K/uL   RBC 3.99 3.87 - 5.11 MIL/uL   Hemoglobin 12.5 12.0 - 15.0 g/dL   HCT 35.6 (L) 36.0 - 46.0 %   MCV 89.2 80.0 - 100.0 fL   MCH 31.3 26.0 - 34.0 pg   MCHC 35.1 30.0 - 36.0 g/dL   RDW 13.5 11.5 - 15.5 %   Platelets 385 150 - 400 K/uL   nRBC 0.0 0.0 - 0.2 %    Comment: Performed at [facility], [place] [address], [place], [place] [address]  Magnesium     Status: Abnormal   Collection Time: 10/04/19 11:58 AM  Result Value Ref Range   Magnesium 1.4 (L) 1.7 - 2.4 mg/dL    Comment: Performed at [facility] [address] [place], [place] [address]  Urinalysis, Routine w reflex microscopic     Status: Abnormal   Collection Time: 10/04/19  9:12 PM  Result Value Ref Range   Color, Urine YELLOW YELLOW   APPearance CLEAR CLEAR   Specific Gravity, Urine 1.008 1.005 - 1.030   pH 5.0 5.0 - 8.0   Glucose, UA NEGATIVE NEGATIVE mg/dL   Hgb urine dipstick NEGATIVE NEGATIVE   Bilirubin Urine NEGATIVE NEGATIVE   Ketones, ur 20 (A) NEGATIVE mg/dL   Protein, ur 30 (A) NEGATIVE mg/dL   Nitrite NEGATIVE NEGATIVE   Leukocytes,Ua SMALL (A) NEGATIVE   RBC / HPF 0-5 0 - 5 RBC/hpf   WBC, UA 6-10 0 - 5 WBC/hpf   Bacteria, UA RARE (A) NONE SEEN   Squamous Epithelial / LPF 0-5 0 - 5   Mucus PRESENT    Hyaline Casts, UA PRESENT     Comment: Performed at [facility], [address], [place], [place] [address]    PHQ2/9: Depression screen [facility] 2/9 10/07/2019 09/18/2019 08/24/2019 08/24/2019 07/24/2019  Decreased Interest 0 0 0 0 0  Down, Depressed, Hopeless 0 0 0 0 0  PHQ - 2 Score 0 0 0 0 0  Altered sleeping 0 0 0 - 0  Tired, decreased energy 0 0 0 - 0  Change in appetite 0 0 0 - 0  Feeling bad or failure about yourself  0 0 0 - 0  Trouble concentrating 0 0 0 - 0  Moving slowly or fidgety/restless 0 0 0 - 0  Suicidal thoughts 0 0 0 - 0  PHQ-9 Score 0 0 0 - 0  Difficult doing work/chores Not difficult at all - - - -   PHQ-2/9 Result is negative.    Fall  Risk: Fall Risk  10/07/2019 09/18/2019 08/24/2019 07/24/2019 02/12/2019  Falls in the past year? 0 0 0 0 0  Number falls in past yr: 0 0 - 0 -  Injury with Fall? 0 0 - 0 -  Follow up Falls evaluation completed - - - -   Assessment & Plan 1. Uncontrolled type 2 diabetes mellitus with microalbuminuria, without long-term current use of insulin (HCC) - Reduce Levemir to 10units today, then reduce by 2 units every 2-3 days with FBS goal <120.  - COMPLETE METABOLIC PANEL WITH GFR  2. History of CVA with residual deficit - No issues  with chewing/swallowing - no SLP indicated.  3. Decreased appetite - Discussed nutrition in detail including protein shakes - she is lactose intolerant, so we researched a few plant based options together - will go to [employer] to obtain.  CMP to evaluate protein nutrition status and ensure stability of derangements from ER.  4. Distorted taste - Discussed seasoning, fruits that she may safely eat, and protein shakes  5. Hypomagnesemia - Magnesium  6. Hypokalemia - COMPLETE METABOLIC PANEL WITH GFR  Labs tomorrow - does not have ride today Increase protein and water intake Decrease insulin each morning - go to 10units today, then decrease by 2 units every 2-3 days with goal fasting blood sugar of <120.  I discussed the assessment and treatment plan with the patient. The patient was provided an opportunity to ask questions and all were answered. The patient agreed with the plan and demonstrated an understanding of the instructions.  The patient was advised to call back or seek an in-person evaluation if the symptoms worsen or if the condition fails to improve as anticipated.  I provided 24 minutes of non-face-to-face time during this encounter.

## 2019-10-09 LAB — COMPLETE METABOLIC PANEL WITH GFR
AG Ratio: 1.3 (calc) (ref 1.0–2.5)
ALT: 17 U/L (ref 6–29)
AST: 22 U/L (ref 10–35)
Albumin: 4.1 g/dL (ref 3.6–5.1)
Alkaline phosphatase (APISO): 71 U/L (ref 37–153)
BUN: 14 mg/dL (ref 7–25)
CO2: 30 mmol/L (ref 20–32)
Calcium: 10.1 mg/dL (ref 8.6–10.4)
Chloride: 97 mmol/L — ABNORMAL LOW (ref 98–110)
Creat: 0.91 mg/dL (ref 0.50–0.99)
GFR, Est African American: 76 mL/min/{1.73_m2} (ref >=60)
GFR, Est Non African American: 66 mL/min/{1.73_m2} (ref >=60)
Globulin: 3.2 g/dL (calc) (ref 1.9–3.7)
Glucose, Bld: 89 mg/dL (ref 65–99)
Potassium: 3.6 mmol/L (ref 3.5–5.3)
Sodium: 138 mmol/L (ref 135–146)
Total Bilirubin: 0.5 mg/dL (ref 0.2–1.2)
Total Protein: 7.3 g/dL (ref 6.1–8.1)

## 2019-10-09 LAB — MAGNESIUM: Magnesium: 1.5 mg/dL (ref 1.5–2.5)

## 2019-10-12 ENCOUNTER — Telehealth: Payer: Self-pay | Admitting: Adult Health

## 2019-10-12 NOTE — Telephone Encounter (Signed)
Pt's sister Nellie on [place] called wanting to know if the pt should continue to take the Asprin 81mg  since she was advised to stop the 325mg . Please advise.

## 2019-10-12 NOTE — Telephone Encounter (Signed)
I called nellie, sister of pt. Pt has stopped the 325mg  aspirin, she wanted to know if pt needing to take 81mg  aspirin along with the plavix.  I relayed no she only needs to be on plavix after 3 months on dual therapy.  She verbalized understanding.

## 2019-10-15 ENCOUNTER — Telehealth: Payer: Self-pay

## 2019-10-15 NOTE — Telephone Encounter (Signed)
I called pt that cardiac event monitor was negative for irregular heart beats. Pt verbalized understanding.

## 2019-10-15 NOTE — Telephone Encounter (Signed)
-----   Message from Rosalin Hawking, MD sent at 10/13/2019  3:20 PM EST ----- Could you please let the patient know that the heart monitoring test done recently was negative for irregular heart beats. Please continue current treatment. Thanks.  Rosalin Hawking, MD PhD Stroke Neurology 10/13/2019 3:20 PM

## 2019-10-17 ENCOUNTER — Other Ambulatory Visit: Payer: Self-pay | Admitting: Family Medicine

## 2019-10-17 DIAGNOSIS — I10 Essential (primary) hypertension: Secondary | ICD-10-CM

## 2019-10-22 ENCOUNTER — Telehealth: Payer: Self-pay

## 2019-10-22 NOTE — Telephone Encounter (Signed)
ORDER SIGNED AND PLACED IN [place] FOLDER. 

## 2019-11-02 ENCOUNTER — Telehealth: Payer: Self-pay

## 2019-11-02 NOTE — Telephone Encounter (Signed)
Copied from [place] [id number]. Topic: General - Other >> Oct 30, 2019  2:33 PM Meagan [name] E wrote: Reason for CRM: Pt called to ask if she needs to keep both appts with Dr. Ancil Boozer Pt has an appt for a 4 week foloow up on 1.27.21 and a return to work follow up for 2.24.21/ Pt would like to know if she needs both appts / please advise >> Oct 30, 2019  2:39 PM Meagan [name] B wrote: Just let us know which one to cancel

## 2019-11-02 NOTE — Telephone Encounter (Signed)
Copied from [place] [id number]. Topic: General - Call Back - No Documentation >> Nov 02, 2019 11:05 AM Erick Blinks wrote: Reason for CRM: Pt needs clarification on her appts, call back  Best contact: [phone] >> Nov 02, 2019 11:26 AM [place], Marland Kitchen wrote: Pt would like to know if she needs to keep her appt on 11/04/2019 because she has another one scheduled early Feb. Please advise?

## 2019-11-03 NOTE — Telephone Encounter (Signed)
Patient does want to come into office. She will be there tomorrow. Call back [phone]

## 2019-11-03 NOTE — Telephone Encounter (Signed)
lvm to inform pt and lvm for her to call to be sure she no longer needs this appt

## 2019-11-04 ENCOUNTER — Encounter: Payer: Self-pay | Admitting: Family Medicine

## 2019-11-04 ENCOUNTER — Other Ambulatory Visit: Payer: Self-pay

## 2019-11-04 ENCOUNTER — Ambulatory Visit (INDEPENDENT_AMBULATORY_CARE_PROVIDER_SITE_OTHER): Payer: BC Managed Care – PPO | Admitting: Family Medicine

## 2019-11-04 VITALS — BP 114/68 | HR 102 | Temp 97.3°F | Resp 16 | Ht 64.0 in | Wt 175.1 lb

## 2019-11-04 DIAGNOSIS — E1140 Type 2 diabetes mellitus with diabetic neuropathy, unspecified: Secondary | ICD-10-CM

## 2019-11-04 DIAGNOSIS — I10 Essential (primary) hypertension: Secondary | ICD-10-CM | POA: Diagnosis not present

## 2019-11-04 DIAGNOSIS — E440 Moderate protein-calorie malnutrition: Secondary | ICD-10-CM

## 2019-11-04 MED ORDER — OLMESARTAN MEDOXOMIL-HCTZ 40-25 MG PO TABS: 0.5000 | ORAL_TABLET | Freq: Every day | ORAL | 0 refills | Status: DC

## 2019-11-04 NOTE — Patient Instructions (Signed)
Stop Metformin Monitor glucose and if glucose goes above 140 go up on Levemir by 2 units every 2 days to keep glucose below 140  BP needs to be around 120-130/80's we will cut benicar hczt to half daily

## 2019-11-04 NOTE — Progress Notes (Signed)
Name: Meagan [name]   MRN: [id number]    DOB: May 07, 1951   Date:11/04/2019       Progress Note  Subjective  Chief Complaint  Chief Complaint  Patient presents with  . Diabetes    Fasting blood sugar was 98 this morning  . Hypertension  . Anorexia    Still has no appetite.    HPI  Weight loss: sister is very worry about her weight loss, she took her to [facility] in Dec for dehydration and malnutrition, she was seen by Raelyn Ensign and they decreased insulin. She states she has lack of appetite, she has lost 30 lbs since October 2020. She states she does not have an appetite. Her glucose at home is very well controlled, she is eating very low. She has been compliant with medications since stroke in 06/2019. Explained that Metformin and Ozempic can cause weight loss and indigestion. She prefers stopping metformin first and if glucose remains controlled with lemevir adjustment if needed we will go down on Ozempic dose.  Drink Boost between meals, needs to drink water to avoid dehydration  HTN: is towards low end of normal and gets dizzy at times also tired, we will go down on benicar hctz dose and monitor bp at home  Patient Active Problem List   Diagnosis Date Noted  . History of CVA with residual deficit 07/01/2019  . Leukocytosis 07/01/2019  . Polycythemia 07/01/2019  . Morbid obesity ([place]) 10/22/2018  . Non compliance with medical treatment 03/26/2018  . Dyslipidemia associated with type 2 diabetes mellitus ([place]) 12/04/2016  . Uncontrolled type 2 diabetes mellitus with microalbuminuria, without long-term current use of insulin ([place]) 12/04/2016  . Seasonal allergies 02/01/2016  . OSA (obstructive sleep apnea) 10/06/2015  . Hypertensive retinopathy 10/06/2015  . Thyroid cyst 10/06/2015  . Dyslipidemia 10/06/2015  . Type 2 diabetes, uncontrolled, with neuropathy ([place]) 10/06/2015  . History of pneumonia 10/06/2015  . Chronic neck pain 10/06/2015  . Carotid stenosis   . Hypertension, benign      Past Surgical History:  Procedure Laterality Date  . APPENDECTOMY  2000s  . BIOPSY THYROID  2006   [facility]  . BREAST EXCISIONAL BIOPSY Left 2000s   neg  . CARPAL TUNNEL RELEASE Left March 2015  . COLONOSCOPY  2011   DR.WOHL  . COLONOSCOPY WITH PROPOFOL N/A 07/11/2017   Procedure: COLONOSCOPY WITH PROPOFOL;  Surgeon: Jonathon Bellows, MD;  [facility] ENDOSCOPY;  Service: Gastroenterology;  Laterality: N/A;  . ganglion cyst removal     . KNEE SURGERY Left 08/2009  . KNEE SURGERY Right 04/07/2010  . ROTATOR CUFF REPAIR Right 2008  . TUBAL LIGATION      Family History  Problem Relation Age of Onset  . Breast cancer Maternal Aunt   . Hypertension Mother   . Kidney Stones Mother   . Hypertension Father   . Congestive Heart Failure Father   . Hypertension Paternal Grandfather   . Diabetes Sister   . Vision loss Sister   . Varicose Veins Sister   . Hypertension Sister   . Breast cancer Sister 64       Double Mastectomy  . Hypertension Sister      Current Outpatient Medications:  .  amLODipine (NORVASC) 5 MG tablet, Take 1 tablet (5 mg total) by mouth daily., Disp: 90 tablet, Rfl: 1 .  atorvastatin (LIPITOR) 80 MG tablet, Take 1 tablet (80 mg total) by mouth daily at 6 PM., Disp: 90 tablet, Rfl: 1 .  blood  glucose meter kit and supplies, Dispense based on patient and insurance preference. Use up to four times daily as directed. (FOR ICD-10 E10.9, E11.9)., Disp: 1 each, Rfl: 0 .  clopidogrel (PLAVIX) 75 MG tablet, Take 1 tablet (75 mg total) by mouth daily., Disp: 90 tablet, Rfl: 1 .  fluticasone (FLONASE) 50 MCG/ACT nasal spray, Place 1 spray into both nostrils daily as needed for allergies or rhinitis., Disp: , Rfl:  .  hydrALAZINE (APRESOLINE) 10 MG tablet, Take 1 tablet (10 mg total) by mouth 3 (three) times daily as needed. If bp goes above 150/90 (Patient taking differently: Take 10 mg by mouth 3 (three) times daily as needed ("if B/P goes above 150/90"). ), Disp: 30 tablet,  Rfl: 0 .  Insulin Detemir (LEVEMIR FLEXTOUCH) 100 UNIT/ML Pen, Inject 25-50 Units into the skin daily. (Patient taking differently: Inject 17 Units into the skin daily after breakfast. ), Disp: 15 mL, Rfl: 2 .  Insulin Pen Needle 32G X 4 MM MISC, 1 each by Does not apply route daily., Disp: 100 each, Rfl: 11 .  levocetirizine (XYZAL) 5 MG tablet, Take 5 mg by mouth daily as needed for allergies. , Disp: , Rfl:  .  olmesartan-hydrochlorothiazide (BENICAR HCT) 40-25 MG tablet, Take 0.5 tablets by mouth daily., Disp: 30 tablet, Rfl: 0 .  ONETOUCH ULTRA test strip, , Disp: , Rfl:  .  polyethylene glycol (MIRALAX / GLYCOLAX) 17 g packet, Take 17 g by mouth daily., Disp: 14 each, Rfl: 0 .  Semaglutide, 1 MG/DOSE, (OZEMPIC, 1 MG/DOSE,) 2 MG/1.5ML SOPN, Inject 1 mg into the skin every 7 (seven) days. (Patient taking differently: Inject 1 mg into the skin every Friday. ), Disp: 9 mL, Rfl: 2 .  traMADol (ULTRAM) 50 MG tablet, Take 1 tablet (50 mg total) by mouth every 6 (six) hours as needed for moderate pain., Disp: 10 tablet, Rfl: 0  Allergies  Allergen Reactions  . Bee Venom Swelling and Other (See Comments)    Immediately takes Benadryl to counteract  . Wilder Glade [Dapagliflozin] Rash    Pt reports that she developed a rash when she took [place] and she would like it to be added as an allergy  . Keflex [Cephalexin] Rash    I personally reviewed active problem list, medication list, allergies, family history, social history with the patient/caregiver today.   ROS  Ten systems reviewed and is negative except as mentioned in HPI   Objective  Vitals:   11/04/19 1345  BP: 114/68  Pulse: (!) 102  Resp: 16  Temp: (!) 97.3 F (36.3 C)  TempSrc: Temporal  SpO2: 99%  Weight: 175 lb 1.6 oz (79.4 kg)  Height: '5\' 4"'  (1.626 m)    Body mass index is 30.06 kg/m.  Physical Exam  Constitutional: Patient appears  malnourished  No distress.  HEENT: head atraumatic, normocephalic, pupils equal and  reactive to light Cardiovascular: Normal rate, regular rhythm and normal heart sounds.  No murmur heard. No BLE edema. Pulmonary/Chest: Effort normal and breath sounds normal. No respiratory distress. Abdominal: Soft.  There is no tenderness. Psychiatric: Patient has a normal mood and affect. behavior is normal. Judgment and thought content normal.  Recent Results (from the past 2160 hour(s))  Cytology - PAP     Status: None   Collection Time: 08/24/19  2:21 PM  Result Value Ref Range   High risk HPV Negative    Adequacy      Satisfactory for evaluation; transformation zone component PRESENT.   Diagnosis      -  Negative for Intraepithelial Lesions or Malignancy (NILM)   Diagnosis - Benign reactive/reparative changes   Lipid panel     Status: Abnormal   Collection Time: 09/18/19 12:00 AM  Result Value Ref Range   Cholesterol 89 <200 mg/dL   HDL 30 (L) > OR = 50 mg/dL   Triglycerides 100 <150 mg/dL   LDL Cholesterol (Calc) 40 mg/dL (calc)    Comment: Reference range: <100 . Desirable range <100 mg/dL for primary prevention;   <70 mg/dL for patients with CHD or diabetic patients  with > or = 2 CHD risk factors. Marland Kitchen LDL-C is now calculated using the Martin-Hopkins  calculation, which is a validated novel method providing  better accuracy than the Friedewald equation in the  estimation of LDL-C.  Cresenciano Genre et al. Annamaria Helling. 0174;944(96): 2061-2068  (http://education.[facility].com/faq/FAQ164)    Total CHOL/HDL Ratio 3.0 <5.0 (calc)   Non-HDL Cholesterol (Calc) 59 <130 mg/dL (calc)    Comment: For patients with diabetes plus 1 major ASCVD risk  factor, treating to a non-HDL-C goal of <100 mg/dL  (LDL-C of <70 mg/dL) is considered a therapeutic  option.   COMPLETE METABOLIC PANEL WITH GFR     Status: Abnormal   Collection Time: 09/18/19 12:00 AM  Result Value Ref Range   Glucose, Bld 95 65 - 99 mg/dL    Comment: .            Fasting reference interval .    BUN 17 7 - 25  mg/dL   Creat 0.99 0.50 - 0.99 mg/dL    Comment: For patients >38 years of age, the reference limit for Creatinine is approximately 13% higher for people identified as African-American. .    GFR, Est Non African American 59 (L) > OR = 60 mL/min/1.22m   GFR, Est African American 69 > OR = 60 mL/min/1.788m  BUN/Creatinine Ratio NOT APPLICABLE 6 - 22 (calc)   Sodium 139 135 - 146 mmol/L   Potassium 3.7 3.5 - 5.3 mmol/L   Chloride 97 (L) 98 - 110 mmol/L   CO2 29 20 - 32 mmol/L   Calcium 9.8 8.6 - 10.4 mg/dL   Total Protein 7.1 6.1 - 8.1 g/dL   Albumin 4.1 3.6 - 5.1 g/dL   Globulin 3.0 1.9 - 3.7 g/dL (calc)   AG Ratio 1.4 1.0 - 2.5 (calc)   Total Bilirubin 0.6 0.2 - 1.2 mg/dL   Alkaline phosphatase (APISO) 78 37 - 153 U/L   AST 19 10 - 35 U/L   ALT 14 6 - 29 U/L  Hemoglobin A1c     Status: Abnormal   Collection Time: 09/18/19 12:00 AM  Result Value Ref Range   Hgb A1c MFr Bld 6.1 (H) <5.7 % of total Hgb    Comment: For someone without known diabetes, a hemoglobin  A1c value between 5.7% and 6.4% is consistent with prediabetes and should be confirmed with a  follow-up test. . For someone with known diabetes, a value <7% indicates that their diabetes is well controlled. A1c targets should be individualized based on duration of diabetes, age, comorbid conditions, and other considerations. . This assay result is consistent with an increased risk of diabetes. . Currently, no consensus exists regarding use of hemoglobin A1c for diagnosis of diabetes for children. .    Mean Plasma Glucose 128 (calc)   eAG (mmol/L) 7.1 (calc)  Basic metabolic panel     Status: Abnormal   Collection Time: 10/04/19 11:58 AM  Result Value Ref Range  Sodium 136 135 - 145 mmol/L   Potassium 3.2 (L) 3.5 - 5.1 mmol/L   Chloride 95 (L) 98 - 111 mmol/L   CO2 26 22 - 32 mmol/L   Glucose, Bld 121 (H) 70 - 99 mg/dL   BUN 13 8 - 23 mg/dL   Creatinine, Ser 1.21 (H) 0.44 - 1.00 mg/dL   Calcium 9.1 8.9  - 10.3 mg/dL   GFR calc non Af Amer 47 (L) >60 mL/min   GFR calc Af Amer 54 (L) >60 mL/min   Anion gap 15 5 - 15    Comment: Performed at [facility] [address], [place], [place] [address]  CBC     Status: Abnormal   Collection Time: 10/04/19 11:58 AM  Result Value Ref Range   WBC 9.2 4.0 - 10.5 K/uL   RBC 3.99 3.87 - 5.11 MIL/uL   Hemoglobin 12.5 12.0 - 15.0 g/dL   HCT 35.6 (L) 36.0 - 46.0 %   MCV 89.2 80.0 - 100.0 fL   MCH 31.3 26.0 - 34.0 pg   MCHC 35.1 30.0 - 36.0 g/dL   RDW 13.5 11.5 - 15.5 %   Platelets 385 150 - 400 K/uL   nRBC 0.0 0.0 - 0.2 %    Comment: Performed at [facility], [place] [address], [place], [place] [address]  Magnesium     Status: Abnormal   Collection Time: 10/04/19 11:58 AM  Result Value Ref Range   Magnesium 1.4 (L) 1.7 - 2.4 mg/dL    Comment: Performed at [facility] [address] [place], [place] [address]  Urinalysis, Routine w reflex microscopic     Status: Abnormal   Collection Time: 10/04/19  9:12 PM  Result Value Ref Range   Color, Urine YELLOW YELLOW   APPearance CLEAR CLEAR   Specific Gravity, Urine 1.008 1.005 - 1.030   pH 5.0 5.0 - 8.0   Glucose, UA NEGATIVE NEGATIVE mg/dL   Hgb urine dipstick NEGATIVE NEGATIVE   Bilirubin Urine NEGATIVE NEGATIVE   Ketones, ur 20 (A) NEGATIVE mg/dL   Protein, ur 30 (A) NEGATIVE mg/dL   Nitrite NEGATIVE NEGATIVE   Leukocytes,Ua SMALL (A) NEGATIVE   RBC / HPF 0-5 0 - 5 RBC/hpf   WBC, UA 6-10 0 - 5 WBC/hpf   Bacteria, UA RARE (A) NONE SEEN   Squamous Epithelial / LPF 0-5 0 - 5   Mucus PRESENT    Hyaline Casts, UA PRESENT     Comment: Performed at [facility], [address] [place], [place] [address]  COMPLETE METABOLIC PANEL WITH GFR     Status: Abnormal   Collection Time: 10/08/19  2:30 PM  Result Value Ref Range   Glucose, Bld 89 65 - 99 mg/dL    Comment: .            Fasting reference interval .    BUN 14 7 - 25 mg/dL   Creat 0.91 0.50 - 0.99 mg/dL     Comment: For patients >34 years of age, the reference limit for Creatinine is approximately 13% higher for people identified as African-American. .    GFR, Est Non African American 66 > OR = 60 mL/min/1.72m   GFR, Est African American 76 > OR = 60 mL/min/1.738m  BUN/Creatinine Ratio NOT APPLICABLE 6 - 22 (calc)   Sodium 138 135 - 146 mmol/L   Potassium 3.6 3.5 - 5.3 mmol/L   Chloride 97 (L) 98 -  110 mmol/L   CO2 30 20 - 32 mmol/L   Calcium 10.1 8.6 - 10.4 mg/dL   Total Protein 7.3 6.1 - 8.1 g/dL   Albumin 4.1 3.6 - 5.1 g/dL   Globulin 3.2 1.9 - 3.7 g/dL (calc)   AG Ratio 1.3 1.0 - 2.5 (calc)   Total Bilirubin 0.5 0.2 - 1.2 mg/dL   Alkaline phosphatase (APISO) 71 37 - 153 U/L   AST 22 10 - 35 U/L   ALT 17 6 - 29 U/L  Magnesium     Status: None   Collection Time: 10/08/19  2:30 PM  Result Value Ref Range   Magnesium 1.5 1.5 - 2.5 mg/dL     PHQ2/9: Depression screen [facility] 2/9 11/04/2019 10/07/2019 09/18/2019 08/24/2019 08/24/2019  Decreased Interest 0 0 0 0 0  Down, Depressed, Hopeless 0 0 0 0 0  PHQ - 2 Score 0 0 0 0 0  Altered sleeping 0 0 0 0 -  Tired, decreased energy 0 0 0 0 -  Change in appetite 3 0 0 0 -  Feeling bad or failure about yourself  0 0 0 0 -  Trouble concentrating 0 0 0 0 -  Moving slowly or fidgety/restless 0 0 0 0 -  Suicidal thoughts 0 0 0 0 -  PHQ-9 Score 3 0 0 0 -  Difficult doing work/chores Not difficult at all Not difficult at all - - -    phq 9 is negative   Fall Risk: Fall Risk  10/07/2019 09/18/2019 08/24/2019 07/24/2019 02/12/2019  Falls in the past year? 0 0 0 0 0  Number falls in past yr: 0 0 - 0 -  Injury with Fall? 0 0 - 0 -  Follow up Falls evaluation completed - - - -      Functional Status Survey: Is the patient deaf or have difficulty hearing?: No Does the patient have difficulty seeing, even when wearing glasses/contacts?: Yes Does the patient have difficulty concentrating, remembering, or making decisions?: No Does the  patient have difficulty walking or climbing stairs?: Yes Does the patient have difficulty dressing or bathing?: No Does the patient have difficulty doing errands alone such as visiting a doctor's office or shopping?: Yes    Assessment & Plan  1. Hypertension, benign  - olmesartan-hydrochlorothiazide (BENICAR HCT) 40-25 MG tablet; Take 0.5 tablets by mouth daily.  Dispense: 30 tablet; Refill: 0  2. Moderate protein-calorie malnutrition ([place])  Discussed importance of eating small meals and multiple times a day  3. Controlled type 2 diabetes with neuropathy ([place])

## 2019-11-05 ENCOUNTER — Encounter: Payer: Self-pay | Admitting: Family Medicine

## 2019-11-10 ENCOUNTER — Emergency Department: Payer: BC Managed Care – PPO

## 2019-11-10 ENCOUNTER — Encounter: Payer: Self-pay | Admitting: Emergency Medicine

## 2019-11-10 ENCOUNTER — Emergency Department
Admission: EM | Admit: 2019-11-10 | Discharge: 2019-11-10 | Disposition: A | Discharge status: Home / Self Care | Payer: BC Managed Care – PPO | Attending: Student in an Organized Health Care Education/Training Program | Admitting: Student in an Organized Health Care Education/Training Program

## 2019-11-10 ENCOUNTER — Other Ambulatory Visit: Payer: Self-pay

## 2019-11-10 DIAGNOSIS — E119 Type 2 diabetes mellitus without complications: Secondary | ICD-10-CM | POA: Diagnosis not present

## 2019-11-10 DIAGNOSIS — Z79899 Other long term (current) drug therapy: Secondary | ICD-10-CM | POA: Insufficient documentation

## 2019-11-10 DIAGNOSIS — I10 Essential (primary) hypertension: Secondary | ICD-10-CM | POA: Insufficient documentation

## 2019-11-10 DIAGNOSIS — R55 Syncope and collapse: Secondary | ICD-10-CM | POA: Insufficient documentation

## 2019-11-10 DIAGNOSIS — R110 Nausea: Secondary | ICD-10-CM | POA: Diagnosis not present

## 2019-11-10 DIAGNOSIS — Z20822 Contact with and (suspected) exposure to covid-19: Secondary | ICD-10-CM | POA: Diagnosis not present

## 2019-11-10 LAB — CBC WITH DIFFERENTIAL/PLATELET
Abs Immature Granulocytes: 0.03 10*3/uL (ref 0.00–0.07)
Basophils Absolute: 0.1 10*3/uL (ref 0.0–0.1)
Basophils Relative: 1 %
Eosinophils Absolute: 0.1 10*3/uL (ref 0.0–0.5)
Eosinophils Relative: 1 %
HCT: 33.8 % — ABNORMAL LOW (ref 36.0–46.0)
Hemoglobin: 12.2 g/dL (ref 12.0–15.0)
Immature Granulocytes: 0 %
Lymphocytes Relative: 17 %
Lymphs Abs: 1.7 10*3/uL (ref 0.7–4.0)
MCH: 31.1 pg (ref 26.0–34.0)
MCHC: 36.1 g/dL — ABNORMAL HIGH (ref 30.0–36.0)
MCV: 86.2 fL (ref 80.0–100.0)
Monocytes Absolute: 0.7 10*3/uL (ref 0.1–1.0)
Monocytes Relative: 7 %
Neutro Abs: 7.4 10*3/uL (ref 1.7–7.7)
Neutrophils Relative %: 74 %
Platelets: 344 10*3/uL (ref 150–400)
RBC: 3.92 MIL/uL (ref 3.87–5.11)
RDW: 13 % (ref 11.5–15.5)
WBC: 9.9 10*3/uL (ref 4.0–10.5)
nRBC: 0 % (ref 0.0–0.2)

## 2019-11-10 LAB — URINALYSIS, COMPLETE (UACMP) WITH MICROSCOPIC
Bilirubin Urine: NEGATIVE
Glucose, UA: NEGATIVE mg/dL
Hgb urine dipstick: NEGATIVE
Ketones, ur: 20 mg/dL — AB
Nitrite: NEGATIVE
Protein, ur: 100 mg/dL — AB
Specific Gravity, Urine: 1.013 (ref 1.005–1.030)
WBC, UA: >50 WBC/hpf — ABNORMAL HIGH (ref 0–5)
pH: 6 (ref 5.0–8.0)

## 2019-11-10 LAB — POC SARS CORONAVIRUS 2 AG: SARS Coronavirus 2 Ag: NEGATIVE

## 2019-11-10 LAB — COMPREHENSIVE METABOLIC PANEL
ALT: 26 U/L (ref 0–44)
AST: 33 U/L (ref 15–41)
Albumin: 4.2 g/dL (ref 3.5–5.0)
Alkaline Phosphatase: 75 U/L (ref 38–126)
Anion gap: 15 (ref 5–15)
BUN: 13 mg/dL (ref 8–23)
CO2: 28 mmol/L (ref 22–32)
Calcium: 9.3 mg/dL (ref 8.9–10.3)
Chloride: 88 mmol/L — ABNORMAL LOW (ref 98–111)
Creatinine, Ser: 0.99 mg/dL (ref 0.44–1.00)
GFR calc Af Amer: >60 mL/min (ref >60)
GFR calc non Af Amer: 59 mL/min — ABNORMAL LOW (ref >60)
Glucose, Bld: 131 mg/dL — ABNORMAL HIGH (ref 70–99)
Potassium: 3.2 mmol/L — ABNORMAL LOW (ref 3.5–5.1)
Sodium: 131 mmol/L — ABNORMAL LOW (ref 135–145)
Total Bilirubin: 0.9 mg/dL (ref 0.3–1.2)
Total Protein: 8.3 g/dL — ABNORMAL HIGH (ref 6.5–8.1)

## 2019-11-10 LAB — TROPONIN I (HIGH SENSITIVITY)
Troponin I (High Sensitivity): 8 ng/L (ref <18)
Troponin I (High Sensitivity): 8 ng/L (ref <18)

## 2019-11-10 MED ORDER — SODIUM CHLORIDE 0.9 % IV BOLUS
500.0000 mL | Freq: Once | INTRAVENOUS | Status: AC
  Administered 2019-11-10: 500 mL via INTRAVENOUS

## 2019-11-10 MED ORDER — ONDANSETRON 4 MG PO TBDP
4.0000 mg | ORAL_TABLET | Freq: Once | ORAL | Status: AC
  Administered 2019-11-10: 18:00:00 4 mg via ORAL
  Filled 2019-11-10: qty 1

## 2019-11-10 MED ORDER — ACETAMINOPHEN 500 MG PO TABS
1000.0000 mg | ORAL_TABLET | Freq: Once | ORAL | Status: AC
  Administered 2019-11-10: 1000 mg via ORAL
  Filled 2019-11-10: qty 2

## 2019-11-10 NOTE — ED Notes (Signed)
Able to move arms and legs in stretcher unassisted. Pt states that she thinks she "might have overdone it" at PT today. Has PT X 2 per week.

## 2019-11-10 NOTE — ED Notes (Signed)
Pt taken to CT.

## 2019-11-10 NOTE — ED Notes (Addendum)
Pt back from CT and xray where she had large amount of emesis, states that she feels better now. Much more talkative, tearful-states that she has continued headache.

## 2019-11-10 NOTE — ED Notes (Signed)
Report received by this RN. Pt resting in bed with eyes closed at this time. NAD noted.

## 2019-11-10 NOTE — ED Notes (Signed)
Pt now able to use limbs with much more purpose, effort against gravity to bilateral legs and better strength in arms. Assisted X 1 and stand by assist X 1 for pt to use restroom.

## 2019-11-10 NOTE — ED Notes (Signed)
Pt able to ambulate with walker.  No assist needed from this RN. Pt able to stand and ambulate without difficulty. MD to bedside to update patient and sister called with patients permission for update and ride home.

## 2019-11-10 NOTE — ED Triage Notes (Signed)
Pt from PT.  EMS called for nausea.  Pt also c/o headache.  At PT pt walked few laps then got nauseated.  Per EMS mental status seemed to decline.  When both arms raised, both fell immediately to bedside.  Pt will speak but quietly and after prompting.  CBG 134 with EMS.  Pt unable to raise either leg off bed even though was just walking at PT.  Pt mostly just stares at you. No facial droop.  Unlabored.

## 2019-11-10 NOTE — ED Provider Notes (Signed)
[facility] Emergency Department Provider Note    First MD Initiated Contact with Patient 11/10/19 [phone]     (approximate)  I have reviewed the triage vital signs and the nursing notes.   HISTORY  Chief Complaint Nausea    HPI Meagan [name] is a 69 y.o. female with the below listed past medical history presents the ER for evaluation of generalized weakness that occurred after she was working with PT today.  Walked several laps and then started feeling generalized weakness and all extremities.  Also had mild headache.  States she has a history of similar headaches.  Was not sudden in onset.  No loss of consciousness but did feel faint.  States has had normal p.o. intake.  Is not the worst headache of her life.  No recent fevers.  No blurry vision. + nausea   Past Medical History:  Diagnosis Date  . Cellulitis    left leg  . 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 Diffuse cystic mastopathy   . Fall 2013  . Hypertension 2004  . Occlusion and stenosis of carotid artery without mention of cerebral infarction   . Sleep apnea   . Stroke [facility])    Family History  Problem Relation Age of Onset  . Breast cancer Maternal Aunt   . Hypertension Mother   . Kidney Stones Mother   . Hypertension Father   . Congestive Heart Failure Father   . Hypertension Paternal Grandfather   . Diabetes Sister   . Vision loss Sister   . Varicose Veins Sister   . Hypertension Sister   . Breast cancer Sister 73       Double Mastectomy  . Hypertension Sister    Past Surgical History:  Procedure Laterality Date  . APPENDECTOMY  2000s  . BIOPSY THYROID  2006   [facility]  . BREAST EXCISIONAL BIOPSY Left 2000s   neg  . CARPAL TUNNEL RELEASE Left March 2015  . COLONOSCOPY  2011   DR.WOHL  . COLONOSCOPY WITH PROPOFOL N/A 07/11/2017   Procedure: COLONOSCOPY WITH PROPOFOL;  Surgeon: Jonathon Bellows, MD;  Location: [facility] ENDOSCOPY;  Service:  Gastroenterology;  Laterality: N/A;  . ganglion cyst removal     . KNEE SURGERY Left 08/2009  . KNEE SURGERY Right 04/07/2010  . ROTATOR CUFF REPAIR Right 2008  . TUBAL LIGATION     Patient Active Problem List   Diagnosis Date Noted  . History of CVA with residual deficit 07/01/2019  . Leukocytosis 07/01/2019  . Polycythemia 07/01/2019  . Morbid obesity ([place]) 10/22/2018  . Non compliance with medical treatment 03/26/2018  . Dyslipidemia associated with type 2 diabetes mellitus ([place]) 12/04/2016  . Uncontrolled type 2 diabetes mellitus with microalbuminuria, without long-term current use of insulin ([place]) 12/04/2016  . Seasonal allergies 02/01/2016  . OSA (obstructive sleep apnea) 10/06/2015  . Hypertensive retinopathy 10/06/2015  . Thyroid cyst 10/06/2015  . Dyslipidemia 10/06/2015  . Type 2 diabetes, uncontrolled, with neuropathy ([place]) 10/06/2015  . History of pneumonia 10/06/2015  . Chronic neck pain 10/06/2015  . Carotid stenosis   . Hypertension, benign       Prior to Admission medications   Medication Sig Start Date End Date Taking? Authorizing Provider  amLODipine (NORVASC) 5 MG tablet Take 1 tablet (5 mg total) by mouth daily. 07/31/19   Steele Sizer, MD  atorvastatin (LIPITOR) 80 MG tablet Take 1 tablet (80 mg total) by mouth daily at 6 PM. 07/31/19  Steele Sizer, MD  blood glucose meter kit and supplies Dispense based on patient and insurance preference. Use up to four times daily as directed. (FOR ICD-10 E10.9, E11.9). 07/31/19   Steele Sizer, MD  clopidogrel (PLAVIX) 75 MG tablet Take 1 tablet (75 mg total) by mouth daily. 07/31/19   Steele Sizer, MD  fluticasone (FLONASE) 50 MCG/ACT nasal spray Place 1 spray into both nostrils daily as needed for allergies or rhinitis.    [provider]  hydrALAZINE (APRESOLINE) 10 MG tablet Take 1 tablet (10 mg total) by mouth 3 (three) times daily as needed. If bp goes above 150/90 Patient taking differently:  Take 10 mg by mouth 3 (three) times daily as needed ("if B/P goes above 150/90").  07/24/19   Steele Sizer, MD  Insulin Detemir (LEVEMIR FLEXTOUCH) 100 UNIT/ML Pen Inject 25-50 Units into the skin daily. Patient taking differently: Inject 17 Units into the skin daily after breakfast.  07/29/19   Ancil Boozer, Drue Stager, MD  Insulin Pen Needle 32G X 4 MM MISC 1 each by Does not apply route daily. 07/30/19   Steele Sizer, MD  levocetirizine (XYZAL) 5 MG tablet Take 5 mg by mouth daily as needed for allergies.     [provider]  olmesartan-hydrochlorothiazide (BENICAR HCT) 40-25 MG tablet Take 0.5 tablets by mouth daily. 11/04/19   Steele Sizer, MD  [facility] ULTRA test strip  09/15/19   [provider]  polyethylene glycol (MIRALAX / GLYCOLAX) 17 g packet Take 17 g by mouth daily. 07/08/19   Mariel Aloe, MD  Semaglutide, 1 MG/DOSE, (OZEMPIC, 1 MG/DOSE,) 2 MG/1.5ML SOPN Inject 1 mg into the skin every 7 (seven) days. Patient taking differently: Inject 1 mg into the skin every Friday.  07/24/19   Steele Sizer, MD  traMADol (ULTRAM) 50 MG tablet Take 1 tablet (50 mg total) by mouth every 6 (six) hours as needed for moderate pain. 07/08/19   Mariel Aloe, MD    Allergies Bee venom, Wilder Glade [dapagliflozin], and Keflex [cephalexin]    Social History Social History   Tobacco Use  . Smoking status: Never Smoker  . Smokeless tobacco: Never Used  Substance Use Topics  . Alcohol use: No  . Drug use: No    Review of Systems Patient denies headaches, rhinorrhea, blurry vision, numbness, shortness of breath, chest pain, edema, cough, abdominal pain, nausea, vomiting, diarrhea, dysuria, fevers, rashes or hallucinations unless otherwise stated above in HPI. ____________________________________________   PHYSICAL EXAM:  VITAL SIGNS: Vitals:   11/10/19 1616 11/10/19 1848  BP: (!) 160/94 (!) 146/87  Pulse: 88 100  Resp: 18 16  Temp: 98.2 F (36.8 C)   SpO2: 99% 98%     Constitutional: Alert and oriented.  Eyes: Conjunctivae are normal.  Head: Atraumatic. Nose: No congestion/rhinnorhea. Mouth/Throat: Mucous membranes are moist.   Neck: No stridor. Painless ROM.  Cardiovascular: Normal rate, regular rhythm. Grossly normal heart sounds.  Good peripheral circulation. Respiratory: Normal respiratory effort.  No retractions. Lungs CTAB. Gastrointestinal: Soft and nontender. No distention. No abdominal bruits. No CVA tenderness. Genitourinary:  Musculoskeletal: No lower extremity tenderness nor edema.  No joint effusions. Neurologic:  CN- intact.  No facial droop, Normal FNF.  Normal heel to shin.  Sensation intact bilaterally. Normal speech and language. No gross focal neurologic deficits are appreciated. No gait instability. Skin:  Skin is warm, dry and intact. No rash noted. Psychiatric: Mood and affect are normal. Speech and behavior are normal.  ____________________________________________   LABS (all labs  ordered are listed, but only abnormal results are displayed)  Results for orders placed or performed during the hospital encounter of 11/10/19 (from the past 24 hour(s))  CBC with Differential/Platelet     Status: Abnormal   Collection Time: 11/10/19  4:21 PM  Result Value Ref Range   WBC 9.9 4.0 - 10.5 K/uL   RBC 3.92 3.87 - 5.11 MIL/uL   Hemoglobin 12.2 12.0 - 15.0 g/dL   HCT 33.8 (L) 36.0 - 46.0 %   MCV 86.2 80.0 - 100.0 fL   MCH 31.1 26.0 - 34.0 pg   MCHC 36.1 (H) 30.0 - 36.0 g/dL   RDW 13.0 11.5 - 15.5 %   Platelets 344 150 - 400 K/uL   nRBC 0.0 0.0 - 0.2 %   Neutrophils Relative % 74 %   Neutro Abs 7.4 1.7 - 7.7 K/uL   Lymphocytes Relative 17 %   Lymphs Abs 1.7 0.7 - 4.0 K/uL   Monocytes Relative 7 %   Monocytes Absolute 0.7 0.1 - 1.0 K/uL   Eosinophils Relative 1 %   Eosinophils Absolute 0.1 0.0 - 0.5 K/uL   Basophils Relative 1 %   Basophils Absolute 0.1 0.0 - 0.1 K/uL   Immature Granulocytes 0 %   Abs Immature  Granulocytes 0.03 0.00 - 0.07 K/uL  Comprehensive metabolic panel     Status: Abnormal   Collection Time: 11/10/19  4:21 PM  Result Value Ref Range   Sodium 131 (L) 135 - 145 mmol/L   Potassium 3.2 (L) 3.5 - 5.1 mmol/L   Chloride 88 (L) 98 - 111 mmol/L   CO2 28 22 - 32 mmol/L   Glucose, Bld 131 (H) 70 - 99 mg/dL   BUN 13 8 - 23 mg/dL   Creatinine, Ser 0.99 0.44 - 1.00 mg/dL   Calcium 9.3 8.9 - 10.3 mg/dL   Total Protein 8.3 (H) 6.5 - 8.1 g/dL   Albumin 4.2 3.5 - 5.0 g/dL   AST 33 15 - 41 U/L   ALT 26 0 - 44 U/L   Alkaline Phosphatase 75 38 - 126 U/L   Total Bilirubin 0.9 0.3 - 1.2 mg/dL   GFR calc non Af Amer 59 (L) >60 mL/min   GFR calc Af Amer >60 >60 mL/min   Anion gap 15 5 - 15  Troponin I (High Sensitivity)     Status: None   Collection Time: 11/10/19  4:21 PM  Result Value Ref Range   Troponin I (High Sensitivity) 8 <18 ng/L  Urinalysis, Complete w Microscopic     Status: Abnormal   Collection Time: 11/10/19  5:12 PM  Result Value Ref Range   Color, Urine YELLOW (A) YELLOW   APPearance HAZY (A) CLEAR   Specific Gravity, Urine 1.013 1.005 - 1.030   pH 6.0 5.0 - 8.0   Glucose, UA NEGATIVE NEGATIVE mg/dL   Hgb urine dipstick NEGATIVE NEGATIVE   Bilirubin Urine NEGATIVE NEGATIVE   Ketones, ur 20 (A) NEGATIVE mg/dL   Protein, ur 100 (A) NEGATIVE mg/dL   Nitrite NEGATIVE NEGATIVE   Leukocytes,Ua LARGE (A) NEGATIVE   RBC / HPF 0-5 0 - 5 RBC/hpf   WBC, UA >50 (H) 0 - 5 WBC/hpf   Bacteria, UA RARE (A) NONE SEEN   Squamous Epithelial / LPF 0-5 0 - 5   Mucus PRESENT    Hyaline Casts, UA PRESENT   Troponin I (High Sensitivity)     Status: None   Collection Time: 11/10/19  6:10 PM  Result Value Ref Range   Troponin I (High Sensitivity) 8 <18 ng/L  POC SARS Coronavirus 2 Ag     Status: None   Collection Time: 11/10/19  6:18 PM  Result Value Ref Range   SARS Coronavirus 2 Ag NEGATIVE NEGATIVE   ____________________________________________  EKG My review and personal  interpretation at Time: 16:25   Indication: weakness  Rate: 90  Rhythm: sinus Axis: normal Other: normal intervals, nonspecific t wave abnmality, unchanaged from previous ____________________________________________  RADIOLOGY  I personally reviewed all radiographic images ordered to evaluate for the above acute complaints and reviewed radiology reports and findings.  These findings were personally discussed with the patient.  Please see medical record for radiology report.  ____________________________________________   PROCEDURES  Procedure(s) performed:  Procedures    Critical Care performed: no ____________________________________________   INITIAL IMPRESSION / ASSESSMENT AND PLAN / ED COURSE  Pertinent labs & imaging results that were available during my care of the patient were reviewed by me and considered in my medical decision making (see chart for details).   DDX: Dehydration, hypoglycemia, cva, drug effect, withdrawal, orthostasis, vasovagal, near syncope,    Meagan [name] is a 69 y.o. who presents to the ED with symptoms as described above.  Patient nontoxic-appearing.  Reassuring exam.  No focal deficits at this time.  Given her history however will order CT imaging to ensure there is no evidence of subdural or mass.  Have a lower suspicion for TIA or CVA.  Will give IV fluids.  Clinical Course as of Nov 09 1933  Tue Nov 10, 2019  1700 Patient now feels much better able to ambulate steady gait.  May be component of dehydration.  We will continue observe.  Does not seem consistent with CVA or TIA.   [PR]  1746 Patient denies any symptoms of UTI. Will send for culture.   [PR]  1931 Patient states that she feels well.  She is worried that she got dehydrated and has had decreased oral intake.  States she does not have as much of an appetite but has been drinking boost's.  Reviewed medical record had very similar presentation the end of December.  She is able to ambulate  with her walker and feels like she is at her baseline.  She not having nausea at this time.  I do believe she is appropriate for discharge home for further workup as an outpatient.  Have discussed with the patient and available family all diagnostics and treatments performed thus far and all questions were answered to the best of my ability. The patient demonstrates understanding and agreement with plan.    [PR]    Clinical Course User Index [PR] Merlyn Lot, MD    The patient was evaluated in Emergency Department today for the symptoms described in the history of present illness. He/she was evaluated in the context of the global COVID-19 pandemic, which necessitated consideration that the patient might be at risk for infection with the SARS-CoV-2 virus that causes COVID-19. Institutional protocols and algorithms that pertain to the evaluation of patients at risk for COVID-19 are in a state of rapid change based on information released by regulatory bodies including the CDC and federal and state organizations. These policies and algorithms were followed during the patient's care in the ED.  As part of my medical decision making, I reviewed the following data within the [place] notes reviewed and incorporated, Labs reviewed, notes from prior ED visits and [place]  Controlled Substance Database   ____________________________________________   FINAL CLINICAL IMPRESSION(S) / ED DIAGNOSES  Final diagnoses:  Nausea  Near syncope      NEW MEDICATIONS STARTED DURING THIS VISIT:  New Prescriptions   No medications on file     Note:  This document was prepared using Dragon voice recognition software and may include unintentional dictation errors.    Merlyn Lot, MD 11/10/19 Joen Laura

## 2019-11-10 NOTE — ED Notes (Signed)
Sister arrived to take patient home. Pt taken in car via Wheelchair in NAD.

## 2019-11-11 ENCOUNTER — Encounter: Payer: Self-pay | Admitting: Adult Health

## 2019-11-11 ENCOUNTER — Ambulatory Visit: Payer: BC Managed Care – PPO | Admitting: Adult Health

## 2019-11-11 ENCOUNTER — Telehealth: Payer: Self-pay

## 2019-11-11 ENCOUNTER — Other Ambulatory Visit: Payer: Self-pay

## 2019-11-11 VITALS — BP 123/78 | HR 93 | Temp 96.8°F | Ht 64.0 in | Wt 174.0 lb

## 2019-11-11 DIAGNOSIS — R634 Abnormal weight loss: Secondary | ICD-10-CM

## 2019-11-11 DIAGNOSIS — E1129 Type 2 diabetes mellitus with other diabetic kidney complication: Secondary | ICD-10-CM

## 2019-11-11 DIAGNOSIS — E785 Hyperlipidemia, unspecified: Secondary | ICD-10-CM | POA: Diagnosis not present

## 2019-11-11 DIAGNOSIS — I10 Essential (primary) hypertension: Secondary | ICD-10-CM

## 2019-11-11 DIAGNOSIS — IMO0002 Reserved for concepts with insufficient information to code with codable children: Secondary | ICD-10-CM

## 2019-11-11 DIAGNOSIS — R471 Dysarthria and anarthria: Secondary | ICD-10-CM

## 2019-11-11 DIAGNOSIS — I63531 Cerebral infarction due to unspecified occlusion or stenosis of right posterior cerebral artery: Secondary | ICD-10-CM

## 2019-11-11 DIAGNOSIS — R809 Proteinuria, unspecified: Secondary | ICD-10-CM

## 2019-11-11 DIAGNOSIS — E1165 Type 2 diabetes mellitus with hyperglycemia: Secondary | ICD-10-CM

## 2019-11-11 DIAGNOSIS — H53462 Homonymous bilateral field defects, left side: Secondary | ICD-10-CM

## 2019-11-11 LAB — URINE CULTURE: Culture: <10000 — AB

## 2019-11-11 NOTE — Telephone Encounter (Signed)
Left a message per Dr.Sowles request to check on her due to going to ER for Nausea. Also to remind her that Ozempic may be causing the nausea, to go down on dose and we may need to stop it.

## 2019-11-11 NOTE — Patient Instructions (Addendum)
Continue plavix and lipitor for secondary stroke prevention  Follow up with PCP regarding ongoing management and monitoring of cholesterol, blood pressure and diabetes as well as ongoing difficulties with lack of appetite   Highly encourage trying to increase taking in protein and ensure enough hydration - speak to PCP regarding possible use of appetite stimulants to see if this would be beneficial    Follow up in 3 months or call earlier if needed        Protein Content in Foods  Generally, most healthy people need around 50 grams of protein each day. Depending on your overall health, you may need more or less protein in your diet. Talk to your health care provider or dietitian about how much protein you need. See the following list for the protein content of some common foods. High-protein foods High-protein foods contain 4 grams (4 g) or more of protein per serving. They include:  Beef, ground sirloin (cooked) -- 3 oz have 24 g of protein.  Cheese (hard) -- 1 oz has 7 g of protein.  Chicken breast, boneless and skinless (cooked) -- 3 oz have 13.4 g of protein.  Cottage cheese -- 1/2 cup has 13.4 g of protein.  Egg -- 1 egg has 6 g of protein.  Fish, filet (cooked) -- 1 oz has 6-7 g of protein.  Garbanzo beans (canned or cooked) -- 1/2 cup has 6-7 g of protein.  Kidney beans (canned or cooked) -- 1/2 cup has 6-7 g of protein.  Lamb (cooked) -- 3 oz has 24 g of protein.  Milk -- 1 cup (8 oz) has 8 g of protein.  Nuts (peanuts, pistachios, almonds) -- 1 oz has 6 g of protein.  Peanut butter -- 1 oz has 7-8 g of protein.  Pork tenderloin (cooked) -- 3 oz has 18.4 g of protein.  Pumpkin seeds -- 1 oz has 8.5 g of protein.  Soybeans (roasted) -- 1 oz has 8 g of protein.  Soybeans (cooked) -- 1/2 cup has 11 g of protein.  Soy milk -- 1 cup (8 oz) has 5-10 g of protein.  Soy or vegetable patty -- 1 patty has 11 g of protein.  Sunflower seeds -- 1 oz has 5.5 g of  protein.  Tofu (firm) -- 1/2 cup has 20 g of protein.  Tuna (canned in water) -- 3 oz has 20 g of protein.  Yogurt -- 6 oz has 8 g of protein. Low-protein foods Low-protein foods contain 3 grams (3 g) or less of protein per serving. They include:  Beets (raw or cooked) -- 1/2 cup has 1.5 g of protein.  Bran cereal -- 1/2 cup has 2-3 g of protein.  Bread -- 1 slice has 2.5 g of protein.  Broccoli (raw or cooked) -- 1/2 cup has 2 g of protein.  Collard greens (raw or cooked) -- 1/2 cup has 2 g of protein.  Corn (fresh or cooked) -- 1/2 cup has 2 g of protein.  Cream cheese -- 1 oz has 2 g of protein.  Creamer (half-and-half) -- 1 oz has 1 g of protein.  Flour tortilla -- 1 tortilla has 2.5 g of protein  Frozen yogurt -- 1/2 cup has 3 g of protein.  Fruit or vegetable juice -- 1/2 cup has 1 g of protein.  Green beans (raw or cooked) -- 1/2 cup has 1 g of protein.  Green peas (canned) -- 1/2 cup has 3.5 g of protein.  Muffins --  1 small muffin (2 oz) has 3 g of protein.  Oatmeal (cooked) -- 1/2 cup has 3 g of protein.  Potato (baked with skin) -- 1 medium potato has 3 g of protein.  Rice (cooked) -- 1/2 cup has 2.5-3.5 g of protein.  Sour cream -- 1/2 cup has 2.5 g of protein.  Spinach (cooked) -- 1/2 cup has 3 g of protein.  Squash (cooked) -- 1/2 cup has 1.5 g of protein. Actual amounts of protein may be different depending on processing. Talk with your health care provider or dietitian about what foods are recommended for you. This information is not intended to replace advice given to you by your health care provider. Make sure you discuss any questions you have with your health care provider. Document Revised: 06/04/2016 Document Reviewed: 06/04/2016 Elsevier Patient Education  2020 [employer].

## 2019-11-11 NOTE — Progress Notes (Signed)
[facility] [address] [place]. [place] [address] [phone]       STROKE FOLLOW UP NOTE  [name] Date of Birth:  Jun 26, 1951 Medical Record Number:  [id number]   Reason for Referral: stroke follow up    CHIEF COMPLAINT:  Chief Complaint  Patient presents with  . Follow-up    3 mon f/u. Sister present. Rm 9. Patient's sister mentioned that she has been losing alot of weight and not eating.     HPI: Stroke admission 07/01/2019: [name] is a 69 y.o. female with history of NIDDM, HTN, OSA who had recent eye injections  presented on 07/01/2019 with HA, difficulty looking to the L that did not resolve and L sided numbness since 9/20.  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  MRI showed acute right parieto-occipital junction infarct along with small vessel disease.  MRA head showed arthrosclerosis bilateral PCAs with focal stenosis right P1/P2 junction.  MRA neck motion degraded.  CTA head neck right ICA 45% stenosis, right ECA 60%, left CCA 30% stenosis.  2D echo unremarkable.  Recommended 30-day cardiac event monitor outpatient to rule out atrial fibrillation. LDL 109.  A1c 10.1.  Initiated DAPT for 3 months due to intracranial stenosis and Plavix alone as previously on aspirin.  Uncontrolled DM and recommend close PCP follow-up.  HTN stable.  Change of statin therapy to atorvastatin 80 mg daily.  Other stroke risk factors include advanced age, obesity and OSA but no prior history of stroke.  Residual deficits of left homonymous hemianopia and recommended follow-up with ophthalmology prior to return to driving.  She also had residual right hemiparesis and discharged to SNF on 07/08/2019 for ongoing therapy.  Initial visit 08/06/2019: [name] is a 69 year old female who is being seen today for stroke follow-up accompanied by her sister.  She has since been discharged from SNF  rehab and currently residing with her sister.  Residual deficits of left homonymous hemianopia, left hemiparesis and delayed recall.  She continues to participate in outpatient PT/OT in [place], [place] with ongoing improvement.  Continues to ambulate with rolling walker.  She denies improvement of her vision.  She has had evaluation by her retina specialist since hospital discharge and has follow-up visit in December.  She does report worsening of vision and headaches with eyestrain.  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  She has not returned to driving due to decreased vision.  She continues on aspirin and Plavix without bleeding or bruising.  Continues on atorvastatin without myalgias.  Blood pressure today 136/89.  She did receive 30-day cardiac event but per patient, has attempted to reach out to her cardiologist in regards to need of monitoring and has not received return phone call.  No further concerns at this time.  Update 11/11/2019: [name] is a 69 year old female who is being seen today by request of her sister due to worsening appetite with weight loss. Established patient with prior history of stroke. 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  Continues on plavix and atorvastatin for secondary stroke prevention without side effects.  Blood pressure today 123/78.  30-day cardiac event monitor completed which was negative for atrial fibrillation. Since prior visit, she has had 2 ED admissions both appearing to be due to dehydration and decreased appetite.  Continues to follow with  PCP for ongoing monitoring and management with adjustment of medications due to possible side effects. Sister is very concerned regarding ongoing decreased appetite and weight loss. Denies new or worsening stroke/TIA symptoms.       ROS:   14 system review of systems performed and negative with exception of decreased  vision, speech difficulty and decreased appetite with weight loss   PMH:  Past Medical History:  Diagnosis Date  . Cellulitis    left leg  . 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 Diffuse cystic mastopathy   . Fall 2013  . Hypertension 2004  . Occlusion and stenosis of carotid artery without mention of cerebral infarction   . Sleep apnea   . Stroke [facility])     PSH:  Past Surgical History:  Procedure Laterality Date  . APPENDECTOMY  2000s  . BIOPSY THYROID  2006   [facility]  . BREAST EXCISIONAL BIOPSY Left 2000s   neg  . CARPAL TUNNEL RELEASE Left March 2015  . COLONOSCOPY  2011   DR.WOHL  . COLONOSCOPY WITH PROPOFOL N/A 07/11/2017   Procedure: COLONOSCOPY WITH PROPOFOL;  Surgeon: Jonathon Bellows, MD;  Location: [facility] ENDOSCOPY;  Service: Gastroenterology;  Laterality: N/A;  . ganglion cyst removal     . KNEE SURGERY Left 08/2009  . KNEE SURGERY Right 04/07/2010  . ROTATOR CUFF REPAIR Right 2008  . TUBAL LIGATION      Social History:  Social History   Socioeconomic History  . Marital status: Widowed    Spouse name: Not on file  . Number of children: 4  . Years of education: Not on file  . Highest education level: Master's degree (e.g., MA, MS, MEng, MEd, MSW, MBA)  Occupational History  . Occupation: Higher education careers adviser   Tobacco Use  . Smoking status: Never Smoker  . Smokeless tobacco: Never Used  Substance and Sexual Activity  . Alcohol use: No  . Drug use: No  . Sexual activity: Not Currently    Partners: Male    Comment: Widow for 14 years  Other Topics Concern  . Not on file  Social History Narrative   She had a stroke on 06/2019, she has left peripheral vision loss since stroke   She moved in with her youngest sister    Social Determinants of Health   Financial Resource Strain:   . Difficulty of Paying Living Expenses: Not on file  Food Insecurity:   . Worried About Charity fundraiser in the Last Year: Not on file  . Ran Out of  Food in the Last Year: Not on file  Transportation Needs:   . Lack of Transportation (Medical): Not on file  . Lack of Transportation (Non-Medical): Not on file  Physical Activity: Inactive  . Days of Exercise per Week: 0 days  . Minutes of Exercise per Session: 0 min  Stress:   . Feeling of Stress : Not on file  Social Connections:   . Frequency of Communication with Friends and Family: Not on file  . Frequency of Social Gatherings with Friends and Family: Not on file  . Attends Religious Services: Not on file  . Active Member of Clubs or Organizations: Not on file  . Attends Archivist Meetings: Not on file  . Marital Status: Not on file  Intimate Partner Violence:   . Fear of Current or Ex-Partner: Not on file  . Emotionally Abused: Not on file  . Physically Abused: Not on file  .  Sexually Abused: Not on file    Family History:  Family History  Problem Relation Age of Onset  . Breast cancer Maternal Aunt   . Hypertension Mother   . Kidney Stones Mother   . Hypertension Father   . Congestive Heart Failure Father   . Hypertension Paternal Grandfather   . Diabetes Sister   . Vision loss Sister   . Varicose Veins Sister   . Hypertension Sister   . Breast cancer Sister 51       Double Mastectomy  . Hypertension Sister     Medications:   Current Outpatient Medications on File Prior to Visit  Medication Sig Dispense Refill  . amLODipine (NORVASC) 5 MG tablet Take 1 tablet (5 mg total) by mouth daily. 90 tablet 1  . atorvastatin (LIPITOR) 80 MG tablet Take 1 tablet (80 mg total) by mouth daily at 6 PM. 90 tablet 1  . blood glucose meter kit and supplies Dispense based on patient and insurance preference. Use up to four times daily as directed. (FOR ICD-10 E10.9, E11.9). 1 each 0  . clopidogrel (PLAVIX) 75 MG tablet Take 1 tablet (75 mg total) by mouth daily. 90 tablet 1  . fluticasone (FLONASE) 50 MCG/ACT nasal spray Place 1 spray into both nostrils daily as  needed for allergies or rhinitis.    . hydrALAZINE (APRESOLINE) 10 MG tablet Take 1 tablet (10 mg total) by mouth 3 (three) times daily as needed. If bp goes above 150/90 (Patient taking differently: Take 10 mg by mouth 3 (three) times daily as needed ("if B/P goes above 150/90"). ) 30 tablet 0  . Insulin Detemir (LEVEMIR FLEXTOUCH) 100 UNIT/ML Pen Inject 25-50 Units into the skin daily. (Patient taking differently: Inject 17 Units into the skin daily after breakfast. ) 15 mL 2  . Insulin Pen Needle 32G X 4 MM MISC 1 each by Does not apply route daily. 100 each 11  . levocetirizine (XYZAL) 5 MG tablet Take 5 mg by mouth daily as needed for allergies.     Marland Kitchen olmesartan-hydrochlorothiazide (BENICAR HCT) 40-25 MG tablet Take 0.5 tablets by mouth daily. 30 tablet 0  . ONETOUCH ULTRA test strip     . polyethylene glycol (MIRALAX / GLYCOLAX) 17 g packet Take 17 g by mouth daily. 14 each 0  . Semaglutide, 1 MG/DOSE, (OZEMPIC, 1 MG/DOSE,) 2 MG/1.5ML SOPN Inject 1 mg into the skin every 7 (seven) days. (Patient taking differently: Inject 1 mg into the skin every Friday. ) 9 mL 2  . traMADol (ULTRAM) 50 MG tablet Take 1 tablet (50 mg total) by mouth every 6 (six) hours as needed for moderate pain. 10 tablet 0   No current facility-administered medications on file prior to visit.    Allergies:   Allergies  Allergen Reactions  . Bee Venom Swelling and Other (See Comments)    Immediately takes Benadryl to counteract  . Wilder Glade [Dapagliflozin] Rash    Pt reports that she developed a rash when she took [place] and she would like it to be added as an allergy  . Keflex [Cephalexin] Rash     Physical Exam  Vitals:   11/11/19 1432  BP: 123/78  Pulse: 93  Temp: (!) 96.8 F (36 C)  TempSrc: Oral  Weight: 174 lb (78.9 kg)  Height: _0  (1.626 m)   Body mass index is 29.87 kg/m. No exam data present   General: well developed, well nourished, pleasant middle-aged African-American female, seated, in  no evident  distress Head: head normocephalic and atraumatic.   Neck: supple with no carotid or supraclavicular bruits Cardiovascular: regular rate and rhythm, no murmurs Musculoskeletal: no deformity Skin:  no rash/petichiae Vascular:  Normal pulses all extremities   Neurologic Exam Mental Status: Awake and fully alert.   Mild dysarthria. Oriented to place and time. Recent and remote memory intact. Attention span, concentration and fund of knowledge appropriate but delayed. Mood and affect appropriate.  Cranial Nerves: Fundoscopic exam reveals sharp disc margins. Pupils equal, briskly reactive to light. Extraocular movements full without nystagmus. Visual fields left homonymous hemianopia.  Hearing intact. Facial sensation intact.  Motor: Normal bulk and tone. Normal strength in all tested extremity muscles except for mild left hip flexor weakness.  Sensory.: intact to touch , pinprick , position and vibratory sensation.  Coordination: Rapid alternating movements normal in all extremities except slightly decreased left hand dexterity. Finger-to-nose and heel-to-shin performed with ataxia on left side. Gait and Station: Arises from chair without difficulty. Stance is normal. Gait demonstrates normal stride length and balance Reflexes: 1+ and symmetric. Toes downgoing.        Diagnostic Data (Labs, Imaging, Testing)  MR BRAIN WO CONTRAST MR MRA HEAD  MR MRA NECK 07/01/2019 IMPRESSION: Acute infarction at the right parieto-occipital junction affecting a region of brain measuring about 5 x 2 x 3 cm. No evidence of hemorrhage. Moderate chronic small-vessel ischemic changes elsewhere affecting the pons in the cerebral hemispheric white matter. Intracranial MR angiography does not show any large or medium vessel occlusion. There is distal vessel atherosclerotic irregularity in both posterior cerebral artery branches, including a focal stenosis at the right P1 P2 junction. No significant  neck vessel disease suspected, but detail is quite limited due to technical deficiency of the noncontrast neck MRA. The patient would not allow repeat imaging.   CT ANGIO NECK W OR WO CONTRAST 07/02/2019 IMPRESSION: 1. 45% diameter stenosis proximal right internal carotid artery. 60% diameter stenosis right external carotid artery 2. 30% diameter stenosis distal left common carotid artery. Left internal carotid artery mildly disease without significant stenosis 3. Both vertebral arteries are patent to the basilar without stenosis.   ECHOCARDIOGRAM 07/02/2019 IMPRESSIONS  1. Left ventricular ejection fraction, by visual estimation, is 60 to 65%. The left ventricle has normal function. Normal left ventricular size. There is mildly increased left ventricular hypertrophy.  2. Left ventricular diastolic Doppler parameters are consistent with impaired relaxation pattern of LV diastolic filling.  3. Trivial pericardial effusion is present.  4. The aortic valve is tricuspid Aortic valve regurgitation was not visualized by color flow Doppler. Structurally normal aortic valve, with no evidence of sclerosis or stenosis.  5. The tricuspid valve is normal in structure. Tricuspid valve regurgitation is trivial.  6. Global right ventricle has normal systolic function.The right ventricular size is normal. No increase in right ventricular wall thickness.  7. Mild mitral annular calcification.  8. The mitral valve is normal in structure. No evidence of mitral valve regurgitation. No evidence of mitral stenosis.  9. Right atrial size was normal. 10. Left atrial size was normal. 11. The tricuspid regurgitant velocity is 2.30 m/s, and with an assumed right atrial pressure of 3 mmHg, the estimated right ventricular systolic pressure is normal at 24.2 mmHg. 12. The inferior vena cava is normal in size with greater than 50% respiratory variability, suggesting right atrial pressure of 3 mmHg.    ASSESSMENT:  Meagan [name] is a 69 y.o. 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 Vascular risk factors include HTN, HLD, DM, OSA and intercranial stenosis.  Residual deficits of mild dysarthria with slowed speech and left homonymous hemianopia which has been stable. Greatest concerns today is regarding decreased appetite with weight loss which apparently is the reason why sister requested todays visit.     PLAN:  1. Weight loss: Greatest discussion today regarding weight loss and decreased appetite concerns.  Currently being monitored by PCP making medication changes for possible side effects causing decreased appetite.  Advised patient and sister to continue to follow with PCP in regards to ongoing concerns.  Discussion regarding increasing protein intake with education provided along with increasing hydration and to speak with PCP about possible appetite stimulant such as Megace or Remeron. 2. Right PCA stroke: Continue clopidogrel 75 mg daily  and atorvastatin for secondary stroke prevention.  Maintain strict control of hypertension with blood pressure goal below 130/90, diabetes with hemoglobin A1c goal below 6.5% and cholesterol with LDL cholesterol (bad cholesterol) goal below 70 mg/dL.  I also advised the patient to eat a healthy diet with plenty of whole grains, cereals, fruits and vegetables, exercise regularly with at least 30 minutes of continuous activity daily and maintain ideal body weight. 3. HTN: Advised to continue current treatment regimen.  Today's BP stable.  Advised to continue to monitor at home along with continued follow-up with PCP for management 4. HLD: Advised to continue current treatment regimen along with continued follow-up with PCP for future prescribing and monitoring of lipid panel 5. DMII: Advised to continue to monitor glucose levels at home along  with continued follow-up with PCP for management and monitoring    Follow up in 3  months or call earlier if needed   Greater than 50% of time during this 30 minute visit was spent on counseling, discussion regarding great concern of decreased appetite with weight loss, discussion regarding prior stroke hx of diagnosis of right PCA stroke, reviewing risk factor management of intracranial stenosis, HTN, HLD and DM, long discussion regarding residual deficits, planning of further management along with potential future management, and discussion with patient and family answering all questions.    Frann Rider, AGNP-BC  [facility] [address] [place], [place] [address]  Phone [phone] Fax [phone] Note: This document was prepared with digital dictation and possible smart phrase technology. Any transcriptional errors that result from this process are unintentional.

## 2019-11-12 ENCOUNTER — Ambulatory Visit: Payer: Self-pay | Admitting: *Deleted

## 2019-11-12 NOTE — Telephone Encounter (Signed)
No answer left VM. If patient calls back, see Dr. Ancil Boozer note to check on her and her nausea.

## 2019-11-12 NOTE — Telephone Encounter (Signed)
Pt and her sister [name] were on the phone together returning a call.  I read them Dr. Ancil Boozer' message regarding the Ozempic possibly causing the nausea (went to ED 2 days ago) that the dose may need to be reduced or stopped.  Message unclear as to how much the dose is to be reduced or if she is to stop the Ozempic.  I let them know someone would call them back after checking with Dr. Ancil Boozer.

## 2019-11-13 ENCOUNTER — Telehealth: Payer: Self-pay

## 2019-11-13 NOTE — Telephone Encounter (Signed)
She was evaluated on Wednesday at Neurologist. He explained to them that if she does not start eating properly she will have to get a feeding tube.  Her bs this morning 123 fasting. She is not emptying or going to the bathroom because she does not have anything in her.

## 2019-11-13 NOTE — Telephone Encounter (Signed)
Please clarify one message informed me to tell the patient to go down on Ozempic due to nausea. Today message informed me to the patient to stop Ozempic all together and monitor her glucose at home. Are we suppose to tell her to stop or go down? Or are these two options for the patient to decide. If the patient chooses to go down to what dosage do we instruct her to go down?

## 2019-11-15 ENCOUNTER — Encounter: Payer: Self-pay | Admitting: Adult Health

## 2019-11-16 NOTE — Progress Notes (Signed)
I agree with the above plan 

## 2019-11-16 NOTE — Telephone Encounter (Signed)
Patient called and she is aware. She will stop Ozempic.

## 2019-11-20 NOTE — Telephone Encounter (Signed)
Patient is doing well. She has gotten her appetite back. She does not have any nausea. She will go back to PT once she is stronger.

## 2019-12-02 ENCOUNTER — Ambulatory Visit (INDEPENDENT_AMBULATORY_CARE_PROVIDER_SITE_OTHER): Payer: BC Managed Care – PPO | Admitting: Family Medicine

## 2019-12-02 ENCOUNTER — Emergency Department
Admission: EM | Admit: 2019-12-02 | Discharge: 2019-12-02 | Disposition: A | Discharge status: Home / Self Care | Payer: BC Managed Care – PPO | Attending: Student | Admitting: Student

## 2019-12-02 ENCOUNTER — Other Ambulatory Visit: Payer: Self-pay

## 2019-12-02 ENCOUNTER — Encounter: Payer: Self-pay | Admitting: Family Medicine

## 2019-12-02 ENCOUNTER — Emergency Department: Payer: BC Managed Care – PPO

## 2019-12-02 VITALS — BP 118/66 | HR 110 | Temp 96.9°F | Resp 16 | Ht 64.0 in | Wt 179.1 lb

## 2019-12-02 DIAGNOSIS — K5900 Constipation, unspecified: Secondary | ICD-10-CM | POA: Diagnosis not present

## 2019-12-02 DIAGNOSIS — E1169 Type 2 diabetes mellitus with other specified complication: Secondary | ICD-10-CM

## 2019-12-02 DIAGNOSIS — E785 Hyperlipidemia, unspecified: Secondary | ICD-10-CM

## 2019-12-02 DIAGNOSIS — Z8673 Personal history of transient ischemic attack (TIA), and cerebral infarction without residual deficits: Secondary | ICD-10-CM | POA: Insufficient documentation

## 2019-12-02 DIAGNOSIS — E119 Type 2 diabetes mellitus without complications: Secondary | ICD-10-CM | POA: Insufficient documentation

## 2019-12-02 DIAGNOSIS — I10 Essential (primary) hypertension: Secondary | ICD-10-CM | POA: Diagnosis not present

## 2019-12-02 DIAGNOSIS — Z79899 Other long term (current) drug therapy: Secondary | ICD-10-CM | POA: Insufficient documentation

## 2019-12-02 DIAGNOSIS — R1030 Lower abdominal pain, unspecified: Secondary | ICD-10-CM | POA: Diagnosis not present

## 2019-12-02 DIAGNOSIS — I6930 Unspecified sequelae of cerebral infarction: Secondary | ICD-10-CM | POA: Diagnosis not present

## 2019-12-02 DIAGNOSIS — Z7902 Long term (current) use of antithrombotics/antiplatelets: Secondary | ICD-10-CM | POA: Diagnosis not present

## 2019-12-02 DIAGNOSIS — Z794 Long term (current) use of insulin: Secondary | ICD-10-CM | POA: Insufficient documentation

## 2019-12-02 LAB — URINALYSIS, COMPLETE (UACMP) WITH MICROSCOPIC
Bacteria, UA: NONE SEEN
Bilirubin Urine: NEGATIVE
Glucose, UA: NEGATIVE mg/dL
Hgb urine dipstick: NEGATIVE
Ketones, ur: NEGATIVE mg/dL
Leukocytes,Ua: NEGATIVE
Nitrite: NEGATIVE
Protein, ur: 30 mg/dL — AB
Specific Gravity, Urine: 1.009 (ref 1.005–1.030)
pH: 5 (ref 5.0–8.0)

## 2019-12-02 LAB — COMPREHENSIVE METABOLIC PANEL
ALT: 20 U/L (ref 0–44)
AST: 23 U/L (ref 15–41)
Albumin: 3.8 g/dL (ref 3.5–5.0)
Alkaline Phosphatase: 86 U/L (ref 38–126)
Anion gap: 9 (ref 5–15)
BUN: 22 mg/dL (ref 8–23)
CO2: 29 mmol/L (ref 22–32)
Calcium: 9.5 mg/dL (ref 8.9–10.3)
Chloride: 98 mmol/L (ref 98–111)
Creatinine, Ser: 0.94 mg/dL (ref 0.44–1.00)
GFR calc Af Amer: >60 mL/min (ref >60)
GFR calc non Af Amer: >60 mL/min (ref >60)
Glucose, Bld: 119 mg/dL — ABNORMAL HIGH (ref 70–99)
Potassium: 3.8 mmol/L (ref 3.5–5.1)
Sodium: 136 mmol/L (ref 135–145)
Total Bilirubin: 0.8 mg/dL (ref 0.3–1.2)
Total Protein: 7.7 g/dL (ref 6.5–8.1)

## 2019-12-02 LAB — CBC
HCT: 34.3 % — ABNORMAL LOW (ref 36.0–46.0)
Hemoglobin: 11.8 g/dL — ABNORMAL LOW (ref 12.0–15.0)
MCH: 31.1 pg (ref 26.0–34.0)
MCHC: 34.4 g/dL (ref 30.0–36.0)
MCV: 90.5 fL (ref 80.0–100.0)
Platelets: 367 10*3/uL (ref 150–400)
RBC: 3.79 MIL/uL — ABNORMAL LOW (ref 3.87–5.11)
RDW: 13.2 % (ref 11.5–15.5)
WBC: 12.8 10*3/uL — ABNORMAL HIGH (ref 4.0–10.5)
nRBC: 0 % (ref 0.0–0.2)

## 2019-12-02 LAB — LIPASE, BLOOD: Lipase: 59 U/L — ABNORMAL HIGH (ref 11–51)

## 2019-12-02 MED ORDER — IOHEXOL 9 MG/ML PO SOLN
500.0000 mL | ORAL | Status: AC
  Administered 2019-12-02 (×2): 500 mL via ORAL
  Filled 2019-12-02 (×2): qty 500

## 2019-12-02 MED ORDER — SODIUM CHLORIDE 0.9% FLUSH: 3.0000 mL | Freq: Once | INTRAVENOUS | Status: DC

## 2019-12-02 MED ORDER — IOHEXOL 300 MG/ML  SOLN
100.0000 mL | Freq: Once | INTRAMUSCULAR | Status: AC | PRN
  Administered 2019-12-02: 100 mL via INTRAVENOUS
  Filled 2019-12-02: qty 100

## 2019-12-02 MED ORDER — SENNOSIDES-DOCUSATE SODIUM 8.6-50 MG PO TABS: 2.0000 | ORAL_TABLET | Freq: Every day | ORAL | 1 refills | Status: DC | PRN

## 2019-12-02 NOTE — ED Provider Notes (Addendum)
[facility] Emergency Department Provider Note  ____________________________________________   First MD Initiated Contact with Patient 12/02/19 1728     (approximate)  I have reviewed the triage vital signs and the nursing notes.  History  Chief Complaint Constipation    HPI Meagan [name] is a 69 y.o. female with hx CVA (w/ residual R sided weakness and some word finding difficulties) who presents for constipation. Patient states she has had ongoing issues with decreaed appetite since her stroke (06/2019) and was primarily only drinking liquids, broth, Ensure, etc. for a while because of this. About 2 weeks ago she felt her appetite improving and started to eat more solid foods.  Since then, for about the last week and a half she has not had a bowel movement.  She is passing a small amount of gas.  Has some nausea but no vomiting.  Continues to tolerate PO.  She has tried multiple over-the-counter laxatives and enemas without relief.  Saw her PCP in clinic today, who attempted rectal exam for fecal impaction but was unsuccessful, and therefore referred to the emergency department for further evaluation, treatment, and rule out of bowel obstruction.  Intra-abdominal surgical history includes appendectomy, tubal ligation.  Patient denies any prior history of bowel obstruction.  She is not on any chronic opiates.    Past Medical Hx Past Medical History:  Diagnosis Date  . Cellulitis    left leg  . 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 Diffuse cystic mastopathy   . Fall 2013  . Hypertension 2004  . Occlusion and stenosis of carotid artery without mention of cerebral infarction   . Sleep apnea   . Stroke [facility])     Problem List Patient Active Problem List   Diagnosis Date Noted  . History of CVA with residual deficit 07/01/2019  . Leukocytosis 07/01/2019  . Polycythemia 07/01/2019  . Morbid obesity ([place]) 10/22/2018  .  Non compliance with medical treatment 03/26/2018  . Dyslipidemia associated with type 2 diabetes mellitus ([place]) 12/04/2016  . Uncontrolled type 2 diabetes mellitus with microalbuminuria, without long-term current use of insulin ([place]) 12/04/2016  . Seasonal allergies 02/01/2016  . OSA (obstructive sleep apnea) 10/06/2015  . Hypertensive retinopathy 10/06/2015  . Thyroid cyst 10/06/2015  . Dyslipidemia 10/06/2015  . Type 2 diabetes, uncontrolled, with neuropathy ([place]) 10/06/2015  . History of pneumonia 10/06/2015  . Chronic neck pain 10/06/2015  . Carotid stenosis   . Hypertension, benign     Past Surgical Hx Past Surgical History:  Procedure Laterality Date  . APPENDECTOMY  2000s  . BIOPSY THYROID  2006   [facility]  . BREAST EXCISIONAL BIOPSY Left 2000s   neg  . CARPAL TUNNEL RELEASE Left March 2015  . COLONOSCOPY  2011   DR.WOHL  . COLONOSCOPY WITH PROPOFOL N/A 07/11/2017   Procedure: COLONOSCOPY WITH PROPOFOL;  Surgeon: Jonathon Bellows, MD;  Location: [facility] ENDOSCOPY;  Service: Gastroenterology;  Laterality: N/A;  . ganglion cyst removal     . KNEE SURGERY Left 08/2009  . KNEE SURGERY Right 04/07/2010  . ROTATOR CUFF REPAIR Right 2008  . TUBAL LIGATION      Medications Prior to Admission medications   Medication Sig Start Date End Date Taking? Authorizing Provider  amLODipine (NORVASC) 5 MG tablet Take 1 tablet (5 mg total) by mouth daily. 07/31/19   Steele Sizer, MD  atorvastatin (LIPITOR) 80 MG tablet Take 1 tablet (80 mg total) by mouth daily at 6  PM. 07/31/19   Steele Sizer, MD  blood glucose meter kit and supplies Dispense based on patient and insurance preference. Use up to four times daily as directed. (FOR ICD-10 E10.9, E11.9). 07/31/19   Steele Sizer, MD  clopidogrel (PLAVIX) 75 MG tablet Take 1 tablet (75 mg total) by mouth daily. 07/31/19   Steele Sizer, MD  fluticasone (FLONASE) 50 MCG/ACT nasal spray Place 1 spray into both nostrils daily as needed for  allergies or rhinitis.    [provider]  hydrALAZINE (APRESOLINE) 10 MG tablet Take 1 tablet (10 mg total) by mouth 3 (three) times daily as needed. If bp goes above 150/90 Patient taking differently: Take 10 mg by mouth 3 (three) times daily as needed ("if B/P goes above 150/90").  07/24/19   Steele Sizer, MD  Insulin Detemir (LEVEMIR FLEXTOUCH) 100 UNIT/ML Pen Inject 25-50 Units into the skin daily. Patient taking differently: Inject 17 Units into the skin daily after breakfast.  07/29/19   Ancil Boozer, Drue Stager, MD  Insulin Pen Needle 32G X 4 MM MISC 1 each by Does not apply route daily. 07/30/19   Steele Sizer, MD  levocetirizine (XYZAL) 5 MG tablet Take 5 mg by mouth daily as needed for allergies.     [provider]  olmesartan-hydrochlorothiazide (BENICAR HCT) 40-25 MG tablet Take 0.5 tablets by mouth daily. 11/04/19   Steele Sizer, MD  [facility] ULTRA test strip  09/15/19   [provider]  polyethylene glycol (MIRALAX / GLYCOLAX) 17 g packet Take 17 g by mouth daily. 07/08/19   Mariel Aloe, MD    Allergies Bee venom, Wilder Glade [dapagliflozin], and Keflex [cephalexin]  Family Hx Family History  Problem Relation Age of Onset  . Breast cancer Maternal Aunt   . Hypertension Mother   . Kidney Stones Mother   . Hypertension Father   . Congestive Heart Failure Father   . Hypertension Paternal Grandfather   . Diabetes Sister   . Vision loss Sister   . Varicose Veins Sister   . Hypertension Sister   . Breast cancer Sister 94       Double Mastectomy  . Hypertension Sister     Social Hx Social History   Tobacco Use  . Smoking status: Never Smoker  . Smokeless tobacco: Never Used  Substance Use Topics  . Alcohol use: No  . Drug use: No     Review of Systems  Constitutional: Negative for fever, chills. Eyes: Negative for visual changes. ENT: Negative for sore throat. Cardiovascular: Negative for chest pain. Respiratory: Negative for  shortness of breath. Gastrointestinal: + constipation  Genitourinary: Negative for dysuria. Musculoskeletal: Negative for leg swelling. Skin: Negative for rash. Neurological: Negative for headaches.   Physical Exam  Vital Signs: ED Triage Vitals  Enc Vitals Group     BP 12/02/19 1653 110/75     Pulse Rate 12/02/19 1653 96     Resp 12/02/19 1653 20     Temp 12/02/19 1653 98.5 F (36.9 C)     Temp Source 12/02/19 1653 Oral     SpO2 12/02/19 1653 100 %     Weight 12/02/19 1654 179 lb (81.2 kg)     Height 12/02/19 1654 '5\' 4"'  (1.626 m)     Head Circumference --      Peak Flow --      Pain Score 12/02/19 1655 8     Pain Loc --      Pain Edu? --      Excl. in  GC? --     Constitutional: Alert and oriented.  Head: Normocephalic. Atraumatic. Eyes: Conjunctivae clear. Sclera anicteric. Nose: No congestion. No rhinorrhea. Mouth/Throat: Wearing mask.  Neck: No stridor.   Cardiovascular: Normal rate, regular rhythm. Extremities well perfused. Respiratory: Normal respiratory effort.  Lungs CTAB. Gastrointestinal: Soft. Non-distended. Mild tenderness in the right lower abdomen.  Rectal: RN chaperone present.  Disimpaction as noted below, successful removal of mixed hard and soft brown stool.  Patient tolerated well. Musculoskeletal: No lower extremity edema. No deformities. Neurologic:  Consistent with history of stroke with residual right sided weakness and some very mild word finding difficulty.  Skin: Skin is warm, dry and intact. No rash noted. Psychiatric: Mood and affect are appropriate for situation.  EKG  Personally reviewed.   Rate: 97 Rhythm: sinus Axis: normal Intervals: WNL No acute ischemic changes No STEMI    Radiology  CT: IMPRESSION:  1. Moderately large amount of stool in the colon and rectum  consistent with constipation. No evidence of bowel obstruction.  2. Rectal inflammation compatible with proctitis.  3. 3.5 cm distal abdominal aortic aneurysm.  Recommend followup by  ultrasound in 2 years. This recommendation follows ACR consensus  guidelines: White Paper of the ACR Incidental Findings Committee II  on Vascular Findings. J Am Coll Radiol 2013; 10:789-794. Aortic  aneurysm NOS (ICD10-I71.9)  4. Uterine fibroid.  5. Small right adrenal myelolipoma.  6. Aortic Atherosclerosis (ICD10-I70.0).    Procedures  Procedure(s) performed (including critical care):  Fecal disimpaction  Date/Time: 12/02/2019 8:51 PM Performed by: Lilia Pro., MD Authorized by: Lilia Pro., MD  Consent: Verbal consent obtained. Risks and benefits: risks, benefits and alternatives were discussed Consent given by: patient Patient understanding: patient states understanding of the procedure being performed Patient consent: the patient's understanding of the procedure matches consent given Imaging studies: imaging studies available Patient identity confirmed: verbally with patient Patient tolerance: patient tolerated the procedure well with no immediate complications Comments: Successful disimpaction with moderate amount of mixed hard and soft brown stool.      Initial Impression / Assessment and Plan / ED Course  69 y.o. female who presents to the ED for constipation  Ddx: slow transit constipation, constipation 2/2 recent dietary changes, electrolyte abnormality, obstruction, ileus  Will evaluate with labs, imaging. Patient would like to trial a soap suds enema first and then if unsuccessful proceed with disimpaction attempt.  CT reveal large amount of stool in colon, rectum c/w constipation. No evidence of bowel obstruction. Patient able to move bowels after soap suds enema and with disimpaction (as above) and comfortable with discharge. CT otherwise also revealed rectal inflammation c/w proctitis, suspect 2/2 constipation and recent straining/enema/exams, do not feel consistent with infectious etiology will defer antibiotics at this time,  patient agreeable. Also noted distal 3.5 cm abdominal aortic aneurysm, need for follow ultrasound as outpatient. Updated patient on this as well who voices understanding.   We will plan for discharge with constipation regimen and outpatient follow-up.  Patient voices understanding and is comfortable with the plan and discharge.  Given return precautions.   Final Clinical Impression(s) / ED Diagnosis  Final diagnoses:  Constipation, unspecified constipation type       Note:  This document was prepared using Dragon voice recognition software and may include unintentional dictation errors.     Lilia Pro., MD 12/02/19 [phone]

## 2019-12-02 NOTE — Progress Notes (Signed)
Name: Meagan [name]   MRN: [id number]    DOB: Jan 15, 1951   Date:12/02/2019       Progress Note  Subjective  Chief Complaint  Chief Complaint  Patient presents with  . Letter for School/Work  . Constipation    She has not had a bowel movement in over a week. She is in alot of pain. She has tried everything OTC with no relief.     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 She has tried Miralax, Dulcolax, prune juice,  and fleet enema without response. No blood in stools. No vomiting, but feeling sweaty. Crying in pain.   Patient Active Problem List   Diagnosis Date Noted  . History of CVA with residual deficit 07/01/2019  . Leukocytosis 07/01/2019  . Polycythemia 07/01/2019  . Morbid obesity ([place]) 10/22/2018  . Non compliance with medical treatment 03/26/2018  . Dyslipidemia associated with type 2 diabetes mellitus ([place]) 12/04/2016  . Uncontrolled type 2 diabetes mellitus with microalbuminuria, without long-term current use of insulin ([place]) 12/04/2016  . Seasonal allergies 02/01/2016  . OSA (obstructive sleep apnea) 10/06/2015  . Hypertensive retinopathy 10/06/2015  . Thyroid cyst 10/06/2015  . Dyslipidemia 10/06/2015  . Type 2 diabetes, uncontrolled, with neuropathy ([place]) 10/06/2015  . History of pneumonia 10/06/2015  . Chronic neck pain 10/06/2015  . Carotid stenosis   . Hypertension, benign     Past Surgical History:  Procedure Laterality Date  . APPENDECTOMY  2000s  . BIOPSY THYROID  2006   [facility]  . BREAST EXCISIONAL BIOPSY Left 2000s   neg  . CARPAL TUNNEL RELEASE Left March 2015  . COLONOSCOPY  2011   DR.WOHL  . COLONOSCOPY WITH PROPOFOL N/A 07/11/2017   Procedure:  COLONOSCOPY WITH PROPOFOL;  Surgeon: Jonathon Bellows, MD;  Location: [facility] ENDOSCOPY;  Service: Gastroenterology;  Laterality: N/A;  . ganglion cyst removal     . KNEE SURGERY Left 08/2009  . KNEE SURGERY Right 04/07/2010  . ROTATOR CUFF REPAIR Right 2008  . TUBAL LIGATION      Family History  Problem Relation Age of Onset  . Breast cancer Maternal Aunt   . Hypertension Mother   . Kidney Stones Mother   . Hypertension Father   . Congestive Heart Failure Father   . Hypertension Paternal Grandfather   . Diabetes Sister   . Vision loss Sister   . Varicose Veins Sister   . Hypertension Sister   . Breast cancer Sister 31       Double Mastectomy  . Hypertension Sister     Social History   Tobacco Use  . Smoking status: Never Smoker  . Smokeless tobacco: Never Used  Substance Use Topics  . Alcohol use: No  . Drug use: No     Current Outpatient Medications:  .  amLODipine (NORVASC) 5 MG tablet, Take 1 tablet (5 mg total) by mouth daily., Disp: 90 tablet, Rfl: 1 .  atorvastatin (LIPITOR) 80 MG tablet, Take 1 tablet (80 mg total) by mouth daily at 6 PM., Disp: 90 tablet, Rfl: 1 .  blood glucose meter kit and supplies, Dispense based on patient and insurance preference. Use up to four times daily as  directed. (FOR ICD-10 E10.9, E11.9)., Disp: 1 each, Rfl: 0 .  clopidogrel (PLAVIX) 75 MG tablet, Take 1 tablet (75 mg total) by mouth daily., Disp: 90 tablet, Rfl: 1 .  fluticasone (FLONASE) 50 MCG/ACT nasal spray, Place 1 spray into both nostrils daily as needed for allergies or rhinitis., Disp: , Rfl:  .  hydrALAZINE (APRESOLINE) 10 MG tablet, Take 1 tablet (10 mg total) by mouth 3 (three) times daily as needed. If bp goes above 150/90 (Patient taking differently: Take 10 mg by mouth 3 (three) times daily as needed ("if B/P goes above 150/90"). ), Disp: 30 tablet, Rfl: 0 .  Insulin Detemir (LEVEMIR FLEXTOUCH) 100 UNIT/ML Pen, Inject 25-50 Units into the skin daily. (Patient taking  differently: Inject 17 Units into the skin daily after breakfast. ), Disp: 15 mL, Rfl: 2 .  Insulin Pen Needle 32G X 4 MM MISC, 1 each by Does not apply route daily., Disp: 100 each, Rfl: 11 .  levocetirizine (XYZAL) 5 MG tablet, Take 5 mg by mouth daily as needed for allergies. , Disp: , Rfl:  .  olmesartan-hydrochlorothiazide (BENICAR HCT) 40-25 MG tablet, Take 0.5 tablets by mouth daily., Disp: 30 tablet, Rfl: 0 .  ONETOUCH ULTRA test strip, , Disp: , Rfl:  .  polyethylene glycol (MIRALAX / GLYCOLAX) 17 g packet, Take 17 g by mouth daily., Disp: 14 each, Rfl: 0 .  Semaglutide, 1 MG/DOSE, (OZEMPIC, 1 MG/DOSE,) 2 MG/1.5ML SOPN, Inject 1 mg into the skin every 7 (seven) days. (Patient taking differently: Inject 1 mg into the skin every Friday. ), Disp: 9 mL, Rfl: 2 .  traMADol (ULTRAM) 50 MG tablet, Take 1 tablet (50 mg total) by mouth every 6 (six) hours as needed for moderate pain., Disp: 10 tablet, Rfl: 0  Allergies  Allergen Reactions  . Bee Venom Swelling and Other (See Comments)    Immediately takes Benadryl to counteract  . Wilder Glade [Dapagliflozin] Rash    Pt reports that she developed a rash when she took [place] and she would like it to be added as an allergy  . Keflex [Cephalexin] Rash    I personally reviewed active problem list, medication list, allergies, family history, social history with the patient/caregiver today.   ROS  Constitutional: Negative for fever, positive for  weight change.  Respiratory: Negative for cough and shortness of breath.   Cardiovascular: Negative for chest pain or palpitations.  Gastrointestinal: Positive for abdominal pain, and  bowel changes.  Musculoskeletal: Positive  for gait problem or joint swelling.  Skin: Negative for rash.  Neurological: Negative for dizziness or headache.  No other specific complaints in a complete review of systems (except as listed in HPI above).  Objective  Vitals:   12/02/19 1450  BP: 118/66  Pulse: (!) 110   Resp: 16  Temp: (!) 96.9 F (36.1 C)  TempSrc: Temporal  SpO2: 98%  Weight: 179 lb 1.6 oz (81.2 kg)  Height: _0  (1.626 m)    Body mass index is 30.74 kg/m.  Physical Exam  Constitutional: Patient appears was crying, seemed uncomfortable, sweaty on her neck and abdominal area, clothes big on her secondary to weight loss  HEENT: head atraumatic, normocephalic Cardiovascular: Normal rate, regular rhythm and normal heart sounds.  No murmur heard. No BLE edema. Pulmonary/Chest: Effort normal and breath sounds normal. No respiratory distress. Abdominal: Soft.  There is lower abdominal tenderness, normal bowel sounds, rectal exam with brown stools, large amount on rectum, pain during exam, no blood Psychiatric:  Patient is cooperative, crying in pain   Recent Results (from the past 2160 hour(s))  Lipid panel     Status: Abnormal   Collection Time: 09/18/19 12:00 AM  Result Value Ref Range   Cholesterol 89 <200 mg/dL   HDL 30 (L) > OR = 50 mg/dL   Triglycerides 100 <150 mg/dL   LDL Cholesterol (Calc) 40 mg/dL (calc)    Comment: Reference range: <100 . Desirable range <100 mg/dL for primary prevention;   <70 mg/dL for patients with CHD or diabetic patients  with > or = 2 CHD risk factors. Marland Kitchen LDL-C is now calculated using the Martin-Hopkins  calculation, which is a validated novel method providing  better accuracy than the Friedewald equation in the  estimation of LDL-C.  Cresenciano Genre et al. Annamaria Helling. 8250;539(76): 2061-2068  (http://education.[facility].com/faq/FAQ164)    Total CHOL/HDL Ratio 3.0 <5.0 (calc)   Non-HDL Cholesterol (Calc) 59 <130 mg/dL (calc)    Comment: For patients with diabetes plus 1 major ASCVD risk  factor, treating to a non-HDL-C goal of <100 mg/dL  (LDL-C of <70 mg/dL) is considered a therapeutic  option.   COMPLETE METABOLIC PANEL WITH GFR     Status: Abnormal   Collection Time: 09/18/19 12:00 AM  Result Value Ref Range   Glucose, Bld 95 65 - 99  mg/dL    Comment: .            Fasting reference interval .    BUN 17 7 - 25 mg/dL   Creat 0.99 0.50 - 0.99 mg/dL    Comment: For patients >94 years of age, the reference limit for Creatinine is approximately 13% higher for people identified as African-American. .    GFR, Est Non African American 59 (L) > OR = 60 mL/min/1.64m   GFR, Est African American 69 > OR = 60 mL/min/1.742m  BUN/Creatinine Ratio NOT APPLICABLE 6 - 22 (calc)   Sodium 139 135 - 146 mmol/L   Potassium 3.7 3.5 - 5.3 mmol/L   Chloride 97 (L) 98 - 110 mmol/L   CO2 29 20 - 32 mmol/L   Calcium 9.8 8.6 - 10.4 mg/dL   Total Protein 7.1 6.1 - 8.1 g/dL   Albumin 4.1 3.6 - 5.1 g/dL   Globulin 3.0 1.9 - 3.7 g/dL (calc)   AG Ratio 1.4 1.0 - 2.5 (calc)   Total Bilirubin 0.6 0.2 - 1.2 mg/dL   Alkaline phosphatase (APISO) 78 37 - 153 U/L   AST 19 10 - 35 U/L   ALT 14 6 - 29 U/L  Hemoglobin A1c     Status: Abnormal   Collection Time: 09/18/19 12:00 AM  Result Value Ref Range   Hgb A1c MFr Bld 6.1 (H) <5.7 % of total Hgb    Comment: For someone without known diabetes, a hemoglobin  A1c value between 5.7% and 6.4% is consistent with prediabetes and should be confirmed with a  follow-up test. . For someone with known diabetes, a value <7% indicates that their diabetes is well controlled. A1c targets should be individualized based on duration of diabetes, age, comorbid conditions, and other considerations. . This assay result is consistent with an increased risk of diabetes. . Currently, no consensus exists regarding use of hemoglobin A1c for diagnosis of diabetes for children. .    Mean Plasma Glucose 128 (calc)   eAG (mmol/L) 7.1 (calc)  Basic metabolic panel     Status: Abnormal   Collection Time: 10/04/19 11:58 AM  Result Value Ref Range  Sodium 136 135 - 145 mmol/L   Potassium 3.2 (L) 3.5 - 5.1 mmol/L   Chloride 95 (L) 98 - 111 mmol/L   CO2 26 22 - 32 mmol/L   Glucose, Bld 121 (H) 70 - 99 mg/dL    BUN 13 8 - 23 mg/dL   Creatinine, Ser 1.21 (H) 0.44 - 1.00 mg/dL   Calcium 9.1 8.9 - 10.3 mg/dL   GFR calc non Af Amer 47 (L) >60 mL/min   GFR calc Af Amer 54 (L) >60 mL/min   Anion gap 15 5 - 15    Comment: Performed at [facility] [address] [place], [place] [address]  CBC     Status: Abnormal   Collection Time: 10/04/19 11:58 AM  Result Value Ref Range   WBC 9.2 4.0 - 10.5 K/uL   RBC 3.99 3.87 - 5.11 MIL/uL   Hemoglobin 12.5 12.0 - 15.0 g/dL   HCT 35.6 (L) 36.0 - 46.0 %   MCV 89.2 80.0 - 100.0 fL   MCH 31.3 26.0 - 34.0 pg   MCHC 35.1 30.0 - 36.0 g/dL   RDW 13.5 11.5 - 15.5 %   Platelets 385 150 - 400 K/uL   nRBC 0.0 0.0 - 0.2 %    Comment: Performed at [facility], [place] [address] [place], [place] [address]  Magnesium     Status: Abnormal   Collection Time: 10/04/19 11:58 AM  Result Value Ref Range   Magnesium 1.4 (L) 1.7 - 2.4 mg/dL    Comment: Performed at [facility] [address], [place], [place] [address]  Urinalysis, Routine w reflex microscopic     Status: Abnormal   Collection Time: 10/04/19  9:12 PM  Result Value Ref Range   Color, Urine YELLOW YELLOW   APPearance CLEAR CLEAR   Specific Gravity, Urine 1.008 1.005 - 1.030   pH 5.0 5.0 - 8.0   Glucose, UA NEGATIVE NEGATIVE mg/dL   Hgb urine dipstick NEGATIVE NEGATIVE   Bilirubin Urine NEGATIVE NEGATIVE   Ketones, ur 20 (A) NEGATIVE mg/dL   Protein, ur 30 (A) NEGATIVE mg/dL   Nitrite NEGATIVE NEGATIVE   Leukocytes,Ua SMALL (A) NEGATIVE   RBC / HPF 0-5 0 - 5 RBC/hpf   WBC, UA 6-10 0 - 5 WBC/hpf   Bacteria, UA RARE (A) NONE SEEN   Squamous Epithelial / LPF 0-5 0 - 5   Mucus PRESENT    Hyaline Casts, UA PRESENT     Comment: Performed at [facility], [address], [place], [place] [address]  COMPLETE METABOLIC PANEL WITH GFR     Status: Abnormal   Collection Time: 10/08/19  2:30 PM  Result Value Ref Range   Glucose, Bld 89 65 - 99 mg/dL    Comment: .            Fasting  reference interval .    BUN 14 7 - 25 mg/dL   Creat 0.91 0.50 - 0.99 mg/dL    Comment: For patients >76 years of age, the reference limit for Creatinine is approximately 13% higher for people identified as African-American. .    GFR, Est Non African American 66 > OR = 60 mL/min/1.23m   GFR, Est African American 76 > OR = 60 mL/min/1.744m  BUN/Creatinine Ratio NOT APPLICABLE 6 - 22 (calc)   Sodium 138 135 - 146 mmol/L   Potassium 3.6 3.5 - 5.3 mmol/L   Chloride 97 (L) 98 -  110 mmol/L   CO2 30 20 - 32 mmol/L   Calcium 10.1 8.6 - 10.4 mg/dL   Total Protein 7.3 6.1 - 8.1 g/dL   Albumin 4.1 3.6 - 5.1 g/dL   Globulin 3.2 1.9 - 3.7 g/dL (calc)   AG Ratio 1.3 1.0 - 2.5 (calc)   Total Bilirubin 0.5 0.2 - 1.2 mg/dL   Alkaline phosphatase (APISO) 71 37 - 153 U/L   AST 22 10 - 35 U/L   ALT 17 6 - 29 U/L  Magnesium     Status: None   Collection Time: 10/08/19  2:30 PM  Result Value Ref Range   Magnesium 1.5 1.5 - 2.5 mg/dL  CBC with Differential/Platelet     Status: Abnormal   Collection Time: 11/10/19  4:21 PM  Result Value Ref Range   WBC 9.9 4.0 - 10.5 K/uL   RBC 3.92 3.87 - 5.11 MIL/uL   Hemoglobin 12.2 12.0 - 15.0 g/dL   HCT 33.8 (L) 36.0 - 46.0 %   MCV 86.2 80.0 - 100.0 fL   MCH 31.1 26.0 - 34.0 pg   MCHC 36.1 (H) 30.0 - 36.0 g/dL   RDW 13.0 11.5 - 15.5 %   Platelets 344 150 - 400 K/uL   nRBC 0.0 0.0 - 0.2 %   Neutrophils Relative % 74 %   Neutro Abs 7.4 1.7 - 7.7 K/uL   Lymphocytes Relative 17 %   Lymphs Abs 1.7 0.7 - 4.0 K/uL   Monocytes Relative 7 %   Monocytes Absolute 0.7 0.1 - 1.0 K/uL   Eosinophils Relative 1 %   Eosinophils Absolute 0.1 0.0 - 0.5 K/uL   Basophils Relative 1 %   Basophils Absolute 0.1 0.0 - 0.1 K/uL   Immature Granulocytes 0 %   Abs Immature Granulocytes 0.03 0.00 - 0.07 K/uL    Comment: Performed at [facility], [address] [place], [place] [address]  Comprehensive metabolic panel     Status: Abnormal   Collection Time:  11/10/19  4:21 PM  Result Value Ref Range   Sodium 131 (L) 135 - 145 mmol/L   Potassium 3.2 (L) 3.5 - 5.1 mmol/L   Chloride 88 (L) 98 - 111 mmol/L   CO2 28 22 - 32 mmol/L   Glucose, Bld 131 (H) 70 - 99 mg/dL   BUN 13 8 - 23 mg/dL   Creatinine, Ser 0.99 0.44 - 1.00 mg/dL   Calcium 9.3 8.9 - 10.3 mg/dL   Total Protein 8.3 (H) 6.5 - 8.1 g/dL   Albumin 4.2 3.5 - 5.0 g/dL   AST 33 15 - 41 U/L   ALT 26 0 - 44 U/L   Alkaline Phosphatase 75 38 - 126 U/L   Total Bilirubin 0.9 0.3 - 1.2 mg/dL   GFR calc non Af Amer 59 (L) >60 mL/min   GFR calc Af Amer >60 >60 mL/min   Anion gap 15 5 - 15    Comment: Performed at [facility], [address] [place], [place] [address]  Troponin I (High Sensitivity)     Status: None   Collection Time: 11/10/19  4:21 PM  Result Value Ref Range   Troponin I (High Sensitivity) 8 <18 ng/L    Comment: (NOTE) Elevated high sensitivity troponin I (hsTnI) values and significant  changes across serial measurements may suggest ACS but many other  chronic and acute conditions are known to elevate hsTnI results.  Refer to the "Links" section for chest pain algorithms and  additional  guidance. Performed at [facility], [address] [place], [place] [address]   Urinalysis, Complete w Microscopic     Status: Abnormal   Collection Time: 11/10/19  5:12 PM  Result Value Ref Range   Color, Urine YELLOW (A) YELLOW   APPearance HAZY (A) CLEAR   Specific Gravity, Urine 1.013 1.005 - 1.030   pH 6.0 5.0 - 8.0   Glucose, UA NEGATIVE NEGATIVE mg/dL   Hgb urine dipstick NEGATIVE NEGATIVE   Bilirubin Urine NEGATIVE NEGATIVE   Ketones, ur 20 (A) NEGATIVE mg/dL   Protein, ur 100 (A) NEGATIVE mg/dL   Nitrite NEGATIVE NEGATIVE   Leukocytes,Ua LARGE (A) NEGATIVE   RBC / HPF 0-5 0 - 5 RBC/hpf   WBC, UA >50 (H) 0 - 5 WBC/hpf   Bacteria, UA RARE (A) NONE SEEN   Squamous Epithelial / LPF 0-5 0 - 5   Mucus PRESENT    Hyaline Casts, UA PRESENT     Comment:  Performed at [facility], [address], [place], [place] [address]  Urine culture     Status: Abnormal   Collection Time: 11/10/19  5:12 PM   Specimen: Urine, Clean Catch  Result Value Ref Range   Specimen Description      URINE, CLEAN CATCH Performed at [facility], [address] [place], [place] [address]    Special Requests      NONE Performed at [facility], [place], [place] [address]    Culture (A)     <10,000 COLONIES/mL INSIGNIFICANT GROWTH Performed at [facility] [address] [place], [place] [address]    Report Status 11/11/2019 FINAL   Troponin I (High Sensitivity)     Status: None   Collection Time: 11/10/19  6:10 PM  Result Value Ref Range   Troponin I (High Sensitivity) 8 <18 ng/L    Comment: (NOTE) Elevated high sensitivity troponin I (hsTnI) values and significant  changes across serial measurements may suggest ACS but many other  chronic and acute conditions are known to elevate hsTnI results.  Refer to the "Links" section for chest pain algorithms and additional  guidance. Performed at [facility], [address] [place], [place] [address]   POC SARS Coronavirus 2 Ag     Status: None   Collection Time: 11/10/19  6:18 PM  Result Value Ref Range   SARS Coronavirus 2 Ag NEGATIVE NEGATIVE    Comment: (NOTE) SARS-CoV-2 antigen NOT DETECTED.  Negative results are presumptive.  Negative results do not preclude SARS-CoV-2 infection and should not be used as the sole basis for treatment or other patient management decisions, including infection  control decisions, particularly in the presence of clinical signs and  symptoms consistent with COVID-19, or in those who have been in contact with the virus.  Negative results must be combined with clinical observations, patient history, and epidemiological information. The expected result is Negative. Fact Sheet for Patients:  PodPark.tn Fact Sheet for Healthcare Providers: GiftContent.is This test is not yet approved or cleared by the [place] FDA and  has been authorized for detection and/or diagnosis of SARS-CoV-2 by FDA under an Emergency Use Authorization (EUA).  This EUA will remain in effect (meaning this test can be used) for the duration of  the COVID-19 de claration under Section 564(b)(1) of the Act, 21 U.S.C. section 360bbb-3(b)(1), unless the authorization is terminated or revoked sooner.       PHQ2/9: Depression screen  [facility] 2/9 12/02/2019 11/04/2019 10/07/2019 09/18/2019 08/24/2019  Decreased Interest 0 0 0 0 0  Down, Depressed, Hopeless 0 0 0 0 0  PHQ - 2 Score 0 0 0 0 0  Altered sleeping 0 0 0 0 0  Tired, decreased energy 0 0 0 0 0  Change in appetite 0 3 0 0 0  Feeling bad or failure about yourself  0 0 0 0 0  Trouble concentrating 0 0 0 0 0  Moving slowly or fidgety/restless 0 0 0 0 0  Suicidal thoughts 0 0 0 0 0  PHQ-9 Score 0 3 0 0 0  Difficult doing work/chores - Not difficult at all Not difficult at all - -  Some recent data might be hidden    phq 9 is negative   Fall Risk: Fall Risk  12/02/2019 10/07/2019 09/18/2019 08/24/2019 07/24/2019  Falls in the past year? 0 0 0 0 0  Number falls in past yr: 0 0 0 - 0  Injury with Fall? 0 0 0 - 0  Follow up - Falls evaluation completed - - -     Functional Status Survey: Is the patient deaf or have difficulty hearing?: No Does the patient have difficulty seeing, even when wearing glasses/contacts?: Yes Does the patient have difficulty concentrating, remembering, or making decisions?: No Does the patient have difficulty walking or climbing stairs?: Yes Does the patient have difficulty dressing or bathing?: Yes Does the patient have difficulty doing errands alone such as visiting a doctor's office or shopping?: Yes   Assessment & Plan  1. Dyslipidemia associated with  type 2 diabetes mellitus ([place])   2. History of CVA with residual deficit  Extend time off work   3. Lower abdominal pain  In acute distress sending to [facility] by EMS, patient had difficulty coming inside our building   4. Acute constipation  Did not responding to otc medication or enema and we don't have enemas in our office

## 2019-12-02 NOTE — ED Triage Notes (Signed)
Pt to the er for constipation and impaction. Pt sent from her PCP for disimpaction. Pt has tried all the home remedies with no relief. PCP was unsuccessful in the office. Pt has had a stroke in September and has developed issues.

## 2019-12-02 NOTE — ED Notes (Signed)
Pt has moderate amount of stool on [facility]

## 2019-12-02 NOTE — ED Notes (Signed)
Soap suds enema given to pt due to constipation.  Pt tolerated this well and had a return of stool tinged enema water as well as small amount of formed stool.  Stool was not hard, it was medium in consistency.  Pt cleaned up with no rinse cleanser and warm water.  Pt then up to [facility] a second time with the result of a small amount of soft stool.  Helped pt clean up and gave her a fresh gown.

## 2019-12-02 NOTE — Discharge Instructions (Addendum)
Thank you for letting us take care of you in the emergency department today.   Please continue to take any regular, prescribed medications.   New medications we have prescribed:  - Bowel clean out method, below - [place]ot, to take daily to help with constipation  Please follow up with: - Your primary care doctor to review your ER visit and follow up on your symptoms.   Please return to the ER for any new or worsening symptoms.   Bowel Clean Out: YOU WILL NEED TO PURCHASE AT THE PHARMACY: 1. 238 gram (8.3 oz) bottle of Miralax (over the counter) 2. 64 ounce bottle of sugar free Gatorade  3. Mix the bottle of Miralax with the bottle of Gatorade in a separate container.  4. Drink an 8 oz glass of the Miralax/Gatorade solution every hour minutes until the solution is gone or until you start having regular bowel movements. You do not need to complete the entire regimen if you begin having satisfactory bowel movements.

## 2019-12-02 NOTE — ED Triage Notes (Signed)
First Nurse:  Patient presents to the ED via EMS for constipation x 1.5 weeks. Patient went to see her PCP but they said they couldn't disimpact her due to too much stool.

## 2019-12-03 ENCOUNTER — Encounter: Payer: Self-pay | Admitting: Family Medicine

## 2019-12-03 ENCOUNTER — Telehealth: Payer: Self-pay | Admitting: Family Medicine

## 2019-12-03 NOTE — Telephone Encounter (Signed)
Copied from [place] [id number]. Topic: General - Other >> Dec 03, 2019  8:32 AM Keene Breath wrote: Reason for CRM: Patient would like the nurse to call regarding her paperwork for her to return to work.  Patient got sick at the office while at her appt.  CB# [phone]

## 2019-12-04 NOTE — Telephone Encounter (Signed)
I did not speak with patient. Front desk said Marnette Burgess was taking care of patient.

## 2019-12-06 ENCOUNTER — Ambulatory Visit: Payer: BC Managed Care – PPO | Attending: Internal Medicine

## 2019-12-06 DIAGNOSIS — Z23 Encounter for immunization: Secondary | ICD-10-CM | POA: Insufficient documentation

## 2019-12-06 NOTE — Progress Notes (Signed)
   Covid-19 Vaccination Clinic  Name:  Meagan [name]    MRN: [id number] DOB: 1951-04-15  12/06/2019  [name] was observed post Covid-19 immunization for 30 minutes based on pre-vaccination screening without incidence. She was provided with Vaccine Information Sheet and instruction to access the V-Safe system.   Ms. Stair was instructed to call 911 with any severe reactions post vaccine: Marland Kitchen Difficulty breathing  . Swelling of your face and throat  . A fast heartbeat  . A bad rash all over your body  . Dizziness and weakness    Immunizations Administered    Name Date Dose VIS Date Route   Pfizer COVID-19 Vaccine 12/06/2019  4:50 PM 0.3 mL 09/18/2019 Intramuscular   Manufacturer: [place]   Lot: [id number]   [place]: [id number]

## 2019-12-09 ENCOUNTER — Other Ambulatory Visit: Payer: Self-pay | Admitting: Family Medicine

## 2019-12-09 NOTE — Telephone Encounter (Signed)
Requested medication (s) are due for refill today: yes  Requested medication (s) are on the active medication list: {yes  Last refill:  10/28/19  Future visit scheduled: yes  Notes to clinic:  historical provider    Requested Prescriptions  Pending Prescriptions Disp Refills   ONETOUCH ULTRA test strip [Pharmacy Med Name: [place] TEST STRP] 200 strip     Sig: USE UP TO 4 TIMES DAILY AS DIRECTED      Endocrinology: Diabetes - Testing Supplies Passed - 12/09/2019  1:00 AM      Passed - Valid encounter within last 12 months    Recent Outpatient Visits           1 week ago Dyslipidemia associated with type 2 diabetes mellitus [facility])   [facility] [place], Drue Stager, MD   1 month ago Moderate protein-calorie malnutrition [facility])   [facility] [place], Drue Stager, MD   2 months ago Uncontrolled type 2 diabetes mellitus with microalbuminuria, without long-term current use of insulin [facility])   [place], FNP   2 months ago History of CVA with residual deficit   [facility] [place], Drue Stager, MD   3 months ago History of CVA with residual deficit   [facility] Steele Sizer, MD       Future Appointments             In 2 months Ancil Boozer, Drue Stager, MD [facility], [facility]

## 2019-12-18 ENCOUNTER — Telehealth: Payer: Self-pay

## 2019-12-18 NOTE — Telephone Encounter (Signed)
Called lmom informing patient of appointment on 12/21/2019. klh

## 2019-12-21 ENCOUNTER — Encounter: Payer: Self-pay | Admitting: Internal Medicine

## 2019-12-21 ENCOUNTER — Ambulatory Visit: Payer: BC Managed Care – PPO | Admitting: Internal Medicine

## 2019-12-21 ENCOUNTER — Other Ambulatory Visit: Payer: Self-pay

## 2019-12-21 VITALS — BP 158/90 | HR 94 | Temp 97.3°F | Resp 16 | Ht 64.5 in | Wt 184.6 lb

## 2019-12-21 DIAGNOSIS — G4733 Obstructive sleep apnea (adult) (pediatric): Secondary | ICD-10-CM | POA: Diagnosis not present

## 2019-12-21 DIAGNOSIS — I10 Essential (primary) hypertension: Secondary | ICD-10-CM | POA: Diagnosis not present

## 2019-12-21 DIAGNOSIS — Z6831 Body mass index (BMI) 31.0-31.9, adult: Secondary | ICD-10-CM | POA: Diagnosis not present

## 2019-12-21 DIAGNOSIS — Z8673 Personal history of transient ischemic attack (TIA), and cerebral infarction without residual deficits: Secondary | ICD-10-CM

## 2019-12-21 NOTE — Progress Notes (Signed)
[facility] [place], [place] [address]  Pulmonary Sleep Medicine   Office Visit Note  Patient Name: Meagan [name] DOB: 16-Sep-1951 MRN [id number]  Date of Service: 12/21/2019  Complaints/HPI: Pt is seen today for pulmonary follow up.  She has a history of osa. Pt reports good compliance with CPAP therapy. Cleaning machine by hand, and changing filters and tubing as directed. Denies headaches, sinus issues, palpitations, or hemoptysis. She was unable to wear her machine on one night because she could not get the cord plugged in.  She has seen linacre who has addressed this issues with her.      ROS  General: (-) fever, (-) chills, (-) night sweats, (-) weakness Skin: (-) rashes, (-) itching,. Eyes: (-) visual changes, (-) redness, (-) itching. Nose and Sinuses: (-) nasal stuffiness or itchiness, (-) postnasal drip, (-) nosebleeds, (-) sinus trouble. Mouth and Throat: (-) sore throat, (-) hoarseness. Neck: (-) swollen glands, (-) enlarged thyroid, (-) neck pain. Respiratory: - cough, (-) bloody sputum, - shortness of breath, - wheezing. Cardiovascular: - ankle swelling, (-) chest pain. Lymphatic: (-) lymph node enlargement. Neurologic: (-) numbness, (-) tingling. Psychiatric: (-) anxiety, (-) depression   Current Medication: Outpatient Encounter Medications as of 12/21/2019  Medication Sig Note  . amLODipine (NORVASC) 5 MG tablet Take 1 tablet (5 mg total) by mouth daily.   Marland Kitchen atorvastatin (LIPITOR) 80 MG tablet Take 1 tablet (80 mg total) by mouth daily at 6 PM.   . blood glucose meter kit and supplies Dispense based on patient and insurance preference. Use up to four times daily as directed. (FOR ICD-10 E10.9, E11.9).   Marland Kitchen clopidogrel (PLAVIX) 75 MG tablet Take 1 tablet (75 mg total) by mouth daily.   . fluticasone (FLONASE) 50 MCG/ACT nasal spray Place 1 spray into both nostrils daily as needed for allergies or rhinitis.   Marland Kitchen glucose blood (ONETOUCH ULTRA)  test strip USE UP TO 4 TIMES DAILY AS DIRECTED   . hydrALAZINE (APRESOLINE) 10 MG tablet Take 1 tablet (10 mg total) by mouth 3 (three) times daily as needed. If bp goes above 150/90 (Patient taking differently: Take 10 mg by mouth 3 (three) times daily as needed ("if B/P goes above 150/90"). )   . Insulin Detemir (LEVEMIR FLEXTOUCH) 100 UNIT/ML Pen Inject 25-50 Units into the skin daily. (Patient taking differently: Inject 17 Units into the skin daily after breakfast. ) 10/04/2019: Patient said her MD wants this tapered as follows: decrease by 2 units every three days, beginning on 09/23/2019  . Insulin Pen Needle 32G X 4 MM MISC 1 each by Does not apply route daily.   Marland Kitchen levocetirizine (XYZAL) 5 MG tablet Take 5 mg by mouth daily as needed for allergies.    Marland Kitchen olmesartan-hydrochlorothiazide (BENICAR HCT) 40-25 MG tablet Take 0.5 tablets by mouth daily.   . polyethylene glycol (MIRALAX / GLYCOLAX) 17 g packet Take 17 g by mouth daily.   Marland Kitchen senna-docusate (SENOKOT-S) 8.6-50 MG tablet Take 2 tablets by mouth daily as needed for mild constipation or moderate constipation.    No facility-administered encounter medications on file as of 12/21/2019.    Surgical History: Past Surgical History:  Procedure Laterality Date  . APPENDECTOMY  2000s  . BIOPSY THYROID  2006   [facility]  . BREAST EXCISIONAL BIOPSY Left 2000s   neg  . CARPAL TUNNEL RELEASE Left March 2015  . COLONOSCOPY  2011   DR.WOHL  . COLONOSCOPY WITH PROPOFOL N/A 07/11/2017  Procedure: COLONOSCOPY WITH PROPOFOL;  Surgeon: Jonathon Bellows, MD;  Location: [facility] ENDOSCOPY;  Service: Gastroenterology;  Laterality: N/A;  . ganglion cyst removal     . KNEE SURGERY Left 08/2009  . KNEE SURGERY Right 04/07/2010  . ROTATOR CUFF REPAIR Right 2008  . TUBAL LIGATION      Medical History: Past Medical History:  Diagnosis Date  . Cellulitis    left leg  . 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 Diffuse cystic  mastopathy   . Fall 2013  . Hypertension 2004  . Occlusion and stenosis of carotid artery without mention of cerebral infarction   . Sleep apnea   . Stroke [facility])     Family History: Family History  Problem Relation Age of Onset  . Breast cancer Maternal Aunt   . Hypertension Mother   . Kidney Stones Mother   . Hypertension Father   . Congestive Heart Failure Father   . Hypertension Paternal Grandfather   . Diabetes Sister   . Vision loss Sister   . Varicose Veins Sister   . Hypertension Sister   . Breast cancer Sister 75       Double Mastectomy  . Hypertension Sister     Social History: Social History   Socioeconomic History  . Marital status: Widowed    Spouse name: Not on file  . Number of children: 4  . Years of education: Not on file  . Highest education level: Master's degree (e.g., MA, MS, MEng, MEd, MSW, MBA)  Occupational History  . Occupation: Higher education careers adviser   Tobacco Use  . Smoking status: Never Smoker  . Smokeless tobacco: Never Used  Substance and Sexual Activity  . Alcohol use: No  . Drug use: No  . Sexual activity: Not Currently    Partners: Male    Comment: Widow for 14 years  Other Topics Concern  . Not on file  Social History Narrative   She had a stroke on 06/2019, she has left peripheral vision loss since stroke   She moved in with her youngest sister    Social Determinants of Health   Financial Resource Strain:   . Difficulty of Paying Living Expenses:   Food Insecurity:   . Worried About Charity fundraiser in the Last Year:   . Arboriculturist in the Last Year:   Transportation Needs:   . Film/video editor (Medical):   Marland Kitchen Lack of Transportation (Non-Medical):   Physical Activity: Inactive  . Days of Exercise per Week: 0 days  . Minutes of Exercise per Session: 0 min  Stress:   . Feeling of Stress :   Social Connections:   . Frequency of Communication with Friends and Family:   . Frequency of Social Gatherings with Friends  and Family:   . Attends Religious Services:   . Active Member of Clubs or Organizations:   . Attends Archivist Meetings:   Marland Kitchen Marital Status:   Intimate Partner Violence:   . Fear of Current or Ex-Partner:   . Emotionally Abused:   Marland Kitchen Physically Abused:   . Sexually Abused:     Vital Signs: Blood pressure (!) 179/97, pulse 94, temperature (!) 97.3 F (36.3 C), resp. rate 16, height 5' 4.5" (1.638 m), weight 184 lb 9.6 oz (83.7 kg), SpO2 99 %.  Examination: General Appearance: The patient is well-developed, well-nourished, and in no distress. Skin: Gross inspection of skin unremarkable. Head: normocephalic, no gross deformities.  Eyes: no gross deformities noted. ENT: ears appear grossly normal no exudates. Neck: Supple. No thyromegaly. No LAD. Respiratory: clear bilaterally. Cardiovascular: Normal S1 and S2 without murmur or rub. Extremities: No cyanosis. pulses are equal. Neurologic: Alert and oriented. No involuntary movements.  LABS: Recent Results (from the past 2160 hour(s))  Basic metabolic panel     Status: Abnormal   Collection Time: 10/04/19 11:58 AM  Result Value Ref Range   Sodium 136 135 - 145 mmol/L   Potassium 3.2 (L) 3.5 - 5.1 mmol/L   Chloride 95 (L) 98 - 111 mmol/L   CO2 26 22 - 32 mmol/L   Glucose, Bld 121 (H) 70 - 99 mg/dL   BUN 13 8 - 23 mg/dL   Creatinine, Ser 1.21 (H) 0.44 - 1.00 mg/dL   Calcium 9.1 8.9 - 10.3 mg/dL   GFR calc non Af Amer 47 (L) >60 mL/min   GFR calc Af Amer 54 (L) >60 mL/min   Anion gap 15 5 - 15    Comment: Performed at [facility] [address], [place], [place] [address]  CBC     Status: Abnormal   Collection Time: 10/04/19 11:58 AM  Result Value Ref Range   WBC 9.2 4.0 - 10.5 K/uL   RBC 3.99 3.87 - 5.11 MIL/uL   Hemoglobin 12.5 12.0 - 15.0 g/dL   HCT 35.6 (L) 36.0 - 46.0 %   MCV 89.2 80.0 - 100.0 fL   MCH 31.3 26.0 - 34.0 pg   MCHC 35.1 30.0 - 36.0 g/dL   RDW 13.5 11.5 - 15.5 %   Platelets 385 150 -  400 K/uL   nRBC 0.0 0.0 - 0.2 %    Comment: Performed at [facility], [place] [address] [place], [place] [address]  Magnesium     Status: Abnormal   Collection Time: 10/04/19 11:58 AM  Result Value Ref Range   Magnesium 1.4 (L) 1.7 - 2.4 mg/dL    Comment: Performed at [facility] [address], [place], [place] [address]  Urinalysis, Routine w reflex microscopic     Status: Abnormal   Collection Time: 10/04/19  9:12 PM  Result Value Ref Range   Color, Urine YELLOW YELLOW   APPearance CLEAR CLEAR   Specific Gravity, Urine 1.008 1.005 - 1.030   pH 5.0 5.0 - 8.0   Glucose, UA NEGATIVE NEGATIVE mg/dL   Hgb urine dipstick NEGATIVE NEGATIVE   Bilirubin Urine NEGATIVE NEGATIVE   Ketones, ur 20 (A) NEGATIVE mg/dL   Protein, ur 30 (A) NEGATIVE mg/dL   Nitrite NEGATIVE NEGATIVE   Leukocytes,Ua SMALL (A) NEGATIVE   RBC / HPF 0-5 0 - 5 RBC/hpf   WBC, UA 6-10 0 - 5 WBC/hpf   Bacteria, UA RARE (A) NONE SEEN   Squamous Epithelial / LPF 0-5 0 - 5   Mucus PRESENT    Hyaline Casts, UA PRESENT     Comment: Performed at [facility], [address], [place], [place] [address]  COMPLETE METABOLIC PANEL WITH GFR     Status: Abnormal   Collection Time: 10/08/19  2:30 PM  Result Value Ref Range   Glucose, Bld 89 65 - 99 mg/dL    Comment: .            Fasting reference interval .    BUN 14 7 - 25 mg/dL   Creat 0.91 0.50 - 0.99 mg/dL    Comment: For patients >49 years of  age, the reference limit for Creatinine is approximately 13% higher for people identified as African-American. .    GFR, Est Non African American 66 > OR = 60 mL/min/1.65m   GFR, Est African American 76 > OR = 60 mL/min/1.748m  BUN/Creatinine Ratio NOT APPLICABLE 6 - 22 (calc)   Sodium 138 135 - 146 mmol/L   Potassium 3.6 3.5 - 5.3 mmol/L   Chloride 97 (L) 98 - 110 mmol/L   CO2 30 20 - 32 mmol/L   Calcium 10.1 8.6 - 10.4 mg/dL   Total Protein 7.3 6.1 - 8.1 g/dL   Albumin 4.1 3.6 - 5.1 g/dL   Globulin  3.2 1.9 - 3.7 g/dL (calc)   AG Ratio 1.3 1.0 - 2.5 (calc)   Total Bilirubin 0.5 0.2 - 1.2 mg/dL   Alkaline phosphatase (APISO) 71 37 - 153 U/L   AST 22 10 - 35 U/L   ALT 17 6 - 29 U/L  Magnesium     Status: None   Collection Time: 10/08/19  2:30 PM  Result Value Ref Range   Magnesium 1.5 1.5 - 2.5 mg/dL  CBC with Differential/Platelet     Status: Abnormal   Collection Time: 11/10/19  4:21 PM  Result Value Ref Range   WBC 9.9 4.0 - 10.5 K/uL   RBC 3.92 3.87 - 5.11 MIL/uL   Hemoglobin 12.2 12.0 - 15.0 g/dL   HCT 33.8 (L) 36.0 - 46.0 %   MCV 86.2 80.0 - 100.0 fL   MCH 31.1 26.0 - 34.0 pg   MCHC 36.1 (H) 30.0 - 36.0 g/dL   RDW 13.0 11.5 - 15.5 %   Platelets 344 150 - 400 K/uL   nRBC 0.0 0.0 - 0.2 %   Neutrophils Relative % 74 %   Neutro Abs 7.4 1.7 - 7.7 K/uL   Lymphocytes Relative 17 %   Lymphs Abs 1.7 0.7 - 4.0 K/uL   Monocytes Relative 7 %   Monocytes Absolute 0.7 0.1 - 1.0 K/uL   Eosinophils Relative 1 %   Eosinophils Absolute 0.1 0.0 - 0.5 K/uL   Basophils Relative 1 %   Basophils Absolute 0.1 0.0 - 0.1 K/uL   Immature Granulocytes 0 %   Abs Immature Granulocytes 0.03 0.00 - 0.07 K/uL    Comment: Performed at [facility] [id number] metabolic panel     Status: Abnormal   Collection Time: 11/10/19  4:21 PM  Result Value Ref Range   Sodium 131 (L) 135 - 145 mmol/L   Potassium 3.2 (L) 3.5 - 5.1 mmol/L   Chloride 88 (L) 98 - 111 mmol/L   CO2 28 22 - 32 mmol/L   Glucose, Bld 131 (H) 70 - 99 mg/dL   BUN 13 8 - 23 mg/dL   Creatinine, Ser 0.99 0.44 - 1.00 mg/dL   Calcium 9.3 8.9 - 10.3 mg/dL   Total Protein 8.3 (H) 6.5 - 8.1 g/dL   Albumin 4.2 3.5 - 5.0 g/dL   AST 33 15 - 41 U/L   ALT 26 0 - 44 U/L   Alkaline Phosphatase 75 38 - 126 U/L   Total Bilirubin 0.9 0.3 - 1.2 mg/dL   GFR calc non Af Amer 59 (L) >60 mL/min   GFR calc Af Amer >60 >60 mL/min   Anion gap 15 5 - 15    Comment: Performed at [facility] [place]Troponin I (High  Sensitivity)     Status: None   Collection Time: 11/10/19  4:21 PM  Result Value Ref Range   Troponin I (High Sensitivity) 8 <18 ng/L    Comment: (NOTE) Elevated high sensitivity troponin I (hsTnI) values and significant  changes across serial measurements may suggest ACS but many other  chronic and acute conditions are known to elevate hsTnI results.  Refer to the "Links" section for chest pain algorithms and additional  guidance. Performed at [facility], [address] [place], [place] [address]   Urinalysis, Complete w Microscopic     Status: Abnormal   Collection Time: 11/10/19  5:12 PM  Result Value Ref Range   Color, Urine YELLOW (A) YELLOW   APPearance HAZY (A) CLEAR   Specific Gravity, Urine 1.013 1.005 - 1.030   pH 6.0 5.0 - 8.0   Glucose, UA NEGATIVE NEGATIVE mg/dL   Hgb urine dipstick NEGATIVE NEGATIVE   Bilirubin Urine NEGATIVE NEGATIVE   Ketones, ur 20 (A) NEGATIVE mg/dL   Protein, ur 100 (A) NEGATIVE mg/dL   Nitrite NEGATIVE NEGATIVE   Leukocytes,Ua LARGE (A) NEGATIVE   RBC / HPF 0-5 0 - 5 RBC/hpf   WBC, UA >50 (H) 0 - 5 WBC/hpf   Bacteria, UA RARE (A) NONE SEEN   Squamous Epithelial / LPF 0-5 0 - 5   Mucus PRESENT    Hyaline Casts, UA PRESENT     Comment: Performed at [facility], [address] [place], [place] [address]  Urine culture     Status: Abnormal   Collection Time: 11/10/19  5:12 PM   Specimen: Urine, Clean Catch  Result Value Ref Range   Specimen Description      URINE, CLEAN CATCH Performed at [facility], [address], [place], [place] [address]    Special Requests      NONE Performed at [facility], [place], [place] [address]    Culture (A)     <10,000 COLONIES/mL INSIGNIFICANT GROWTH Performed at [facility] [address], [place], [place] [address]    Report Status 11/11/2019 FINAL   Troponin I  (High Sensitivity)     Status: None   Collection Time: 11/10/19  6:10 PM  Result Value Ref Range   Troponin I (High Sensitivity) 8 <18 ng/L    Comment: (NOTE) Elevated high sensitivity troponin I (hsTnI) values and significant  changes across serial measurements may suggest ACS but many other  chronic and acute conditions are known to elevate hsTnI results.  Refer to the "Links" section for chest pain algorithms and additional  guidance. Performed at [facility] [address] [place], [place] [address]   POC SARS Coronavirus 2 Ag     Status: None   Collection Time: 11/10/19  6:18 PM  Result Value Ref Range   SARS Coronavirus 2 Ag NEGATIVE NEGATIVE    Comment: (NOTE) SARS-CoV-2 antigen NOT DETECTED.  Negative results are presumptive.  Negative results do not preclude SARS-CoV-2 infection and should not be used as the sole basis for treatment or other patient management decisions, including infection  control decisions, particularly in the presence of clinical signs and  symptoms consistent with COVID-19, or in those who have been in contact with the virus.  Negative results must be combined with clinical observations, patient history, and epidemiological information. The expected result is Negative. Fact Sheet for Patients: PodPark.tn Fact Sheet for Healthcare Providers: GiftContent.is This test is not yet approved  or cleared by the [place] and  has been authorized for detection and/or diagnosis of SARS-CoV-2 by FDA under an Emergency Use Authorization (EUA).  This EUA will remain in effect (meaning this test can be used) for the duration of  the COVID-19 de claration under Section 564(b)(1) of the Act, 21 U.S.C. section 360bbb-3(b)(1), unless the authorization is terminated or revoked sooner.   Lipase, blood     Status: Abnormal   Collection Time: 12/02/19  5:38 PM  Result Value Ref Range    Lipase 59 (H) 11 - 51 U/L    Comment: Performed at [facility], [address] [place], [place] [address]  Comprehensive metabolic panel     Status: Abnormal   Collection Time: 12/02/19  5:38 PM  Result Value Ref Range   Sodium 136 135 - 145 mmol/L   Potassium 3.8 3.5 - 5.1 mmol/L   Chloride 98 98 - 111 mmol/L   CO2 29 22 - 32 mmol/L   Glucose, Bld 119 (H) 70 - 99 mg/dL    Comment: Glucose reference range applies only to samples taken after fasting for at least 8 hours.   BUN 22 8 - 23 mg/dL   Creatinine, Ser 0.94 0.44 - 1.00 mg/dL   Calcium 9.5 8.9 - 10.3 mg/dL   Total Protein 7.7 6.5 - 8.1 g/dL   Albumin 3.8 3.5 - 5.0 g/dL   AST 23 15 - 41 U/L   ALT 20 0 - 44 U/L   Alkaline Phosphatase 86 38 - 126 U/L   Total Bilirubin 0.8 0.3 - 1.2 mg/dL   GFR calc non Af Amer >60 >60 mL/min   GFR calc Af Amer >60 >60 mL/min   Anion gap 9 5 - 15    Comment: Performed at [facility], [address] [place], [place] [address]  CBC     Status: Abnormal   Collection Time: 12/02/19  5:38 PM  Result Value Ref Range   WBC 12.8 (H) 4.0 - 10.5 K/uL   RBC 3.79 (L) 3.87 - 5.11 MIL/uL   Hemoglobin 11.8 (L) 12.0 - 15.0 g/dL   HCT 34.3 (L) 36.0 - 46.0 %   MCV 90.5 80.0 - 100.0 fL   MCH 31.1 26.0 - 34.0 pg   MCHC 34.4 30.0 - 36.0 g/dL   RDW 13.2 11.5 - 15.5 %   Platelets 367 150 - 400 K/uL   nRBC 0.0 0.0 - 0.2 %    Comment: Performed at [facility], [address] [place], [place] [address]  Urinalysis, Complete w Microscopic     Status: Abnormal   Collection Time: 12/02/19  7:19 PM  Result Value Ref Range   Color, Urine YELLOW (A) YELLOW   APPearance CLEAR (A) CLEAR   Specific Gravity, Urine 1.009 1.005 - 1.030   pH 5.0 5.0 - 8.0   Glucose, UA NEGATIVE NEGATIVE mg/dL   Hgb urine dipstick NEGATIVE NEGATIVE   Bilirubin Urine NEGATIVE NEGATIVE   Ketones, ur NEGATIVE NEGATIVE mg/dL   Protein, ur 30 (A) NEGATIVE mg/dL   Nitrite NEGATIVE NEGATIVE   Leukocytes,Ua  NEGATIVE NEGATIVE   RBC / HPF 0-5 0 - 5 RBC/hpf   WBC, UA 0-5 0 - 5 WBC/hpf   Bacteria, UA NONE SEEN NONE SEEN   Squamous Epithelial / LPF 0-5 0 - 5   Mucus PRESENT     Comment: Performed at [facility], [address] [place], [place] [address]    Radiology:  CT Abdomen Pelvis W Contrast  Result Date: 12/02/2019 CLINICAL DATA:  Constipation. EXAM: CT ABDOMEN AND PELVIS WITH CONTRAST TECHNIQUE: Multidetector CT imaging of the abdomen and pelvis was performed using the standard protocol following bolus administration of intravenous contrast. CONTRAST:  177m OMNIPAQUE IOHEXOL 300 MG/ML  SOLN COMPARISON:  Abdominal ultrasound 11/19/2005 FINDINGS: Lower chest: Minimal atelectasis or scarring in the lung bases. No pleural effusion. Normal heart size. Hepatobiliary: No focal liver abnormality is seen. No gallstones, gallbladder wall thickening, or biliary dilatation. Pancreas: Unremarkable. Spleen: Unremarkable. Adrenals/Urinary Tract: 6 mm fat density mass in the right adrenal gland consistent with a myelolipoma. Unremarkable left adrenal gland. No evidence of renal mass, calculi, or hydronephrosis. Unremarkable bladder. Stomach/Bowel: The stomach is unremarkable. There is a moderately large amount of stool in the colon and rectum, and there is circumferential rectal wall thickening with perirectal and presacral fat stranding. There is no evidence of bowel obstruction. Prior appendectomy. Vascular/Lymphatic: Aortic atherosclerosis with 3.5 cm aneurysm of the distal aorta at the bifurcation involving the proximal left common iliac artery. No enlarged lymph nodes. Reproductive: 3.2 cm right uterine fundal mass. Unremarkable ovaries. Other: No ascites or pneumoperitoneum. Musculoskeletal: No acute osseous abnormality or suspicious osseous lesion. IMPRESSION: 1. Moderately large amount of stool in the colon and rectum consistent with constipation. No evidence of bowel obstruction. 2. Rectal  inflammation compatible with proctitis. 3. 3.5 cm distal abdominal aortic aneurysm. Recommend followup by ultrasound in 2 years. This recommendation follows ACR consensus guidelines: White Paper of the ACR Incidental Findings Committee II on Vascular Findings. J Am Coll Radiol 2013; 10:789-794. Aortic aneurysm NOS (ICD10-I71.9) 4. Uterine fibroid. 5. Small right adrenal myelolipoma. 6. Aortic Atherosclerosis (ICD10-I70.0). Electronically Signed   By: ALogan BoresM.D.   On: 12/02/2019 20:07    No results found.  CT Abdomen Pelvis W Contrast  Result Date: 12/02/2019 CLINICAL DATA:  Constipation. EXAM: CT ABDOMEN AND PELVIS WITH CONTRAST TECHNIQUE: Multidetector CT imaging of the abdomen and pelvis was performed using the standard protocol following bolus administration of intravenous contrast. CONTRAST:  1045mOMNIPAQUE IOHEXOL 300 MG/ML  SOLN COMPARISON:  Abdominal ultrasound 11/19/2005 FINDINGS: Lower chest: Minimal atelectasis or scarring in the lung bases. No pleural effusion. Normal heart size. Hepatobiliary: No focal liver abnormality is seen. No gallstones, gallbladder wall thickening, or biliary dilatation. Pancreas: Unremarkable. Spleen: Unremarkable. Adrenals/Urinary Tract: 6 mm fat density mass in the right adrenal gland consistent with a myelolipoma. Unremarkable left adrenal gland. No evidence of renal mass, calculi, or hydronephrosis. Unremarkable bladder. Stomach/Bowel: The stomach is unremarkable. There is a moderately large amount of stool in the colon and rectum, and there is circumferential rectal wall thickening with perirectal and presacral fat stranding. There is no evidence of bowel obstruction. Prior appendectomy. Vascular/Lymphatic: Aortic atherosclerosis with 3.5 cm aneurysm of the distal aorta at the bifurcation involving the proximal left common iliac artery. No enlarged lymph nodes. Reproductive: 3.2 cm right uterine fundal mass. Unremarkable ovaries. Other: No ascites or  pneumoperitoneum. Musculoskeletal: No acute osseous abnormality or suspicious osseous lesion. IMPRESSION: 1. Moderately large amount of stool in the colon and rectum consistent with constipation. No evidence of bowel obstruction. 2. Rectal inflammation compatible with proctitis. 3. 3.5 cm distal abdominal aortic aneurysm. Recommend followup by ultrasound in 2 years. This recommendation follows ACR consensus guidelines: White Paper of the ACR Incidental Findings Committee II on Vascular Findings. J Am Coll Radiol 2013; 10:789-794. Aortic aneurysm NOS (ICD10-I71.9) 4. Uterine fibroid. 5. Small right adrenal myelolipoma. 6. Aortic Atherosclerosis (  ICD10-I70.0). Electronically Signed   By: Logan Bores M.D.   On: 12/02/2019 20:07      Assessment and Plan: Patient Active Problem List   Diagnosis Date Noted  . History of CVA with residual deficit 07/01/2019  . Leukocytosis 07/01/2019  . Polycythemia 07/01/2019  . Morbid obesity ([place]) 10/22/2018  . Non compliance with medical treatment 03/26/2018  . Dyslipidemia associated with type 2 diabetes mellitus ([place]) 12/04/2016  . Uncontrolled type 2 diabetes mellitus with microalbuminuria, without long-term current use of insulin ([place]) 12/04/2016  . Seasonal allergies 02/01/2016  . OSA (obstructive sleep apnea) 10/06/2015  . Hypertensive retinopathy 10/06/2015  . Thyroid cyst 10/06/2015  . Dyslipidemia 10/06/2015  . Type 2 diabetes, uncontrolled, with neuropathy ([place]) 10/06/2015  . History of pneumonia 10/06/2015  . Chronic neck pain 10/06/2015  . Carotid stenosis   . Hypertension, benign     1. OSA (obstructive sleep apnea) Stable, continue to use cpap as directed.   2. History of stroke  Doing well, no residual deficit.   3. BMI 31.0-31.9,adult Obesity Counseling: Risk Assessment: An assessment of behavioral risk factors was made today and includes lack of exercise sedentary lifestyle, lack of portion control and poor dietary habits.  Risk  Modification Advice: She was counseled on portion control guidelines. Restricting daily caloric intake to 1800. The detrimental long term effects of obesity on her health and ongoing poor compliance was also discussed with the patient.  4. Hypertension, essential Slightly elevated,   General Counseling: I have discussed the findings of the evaluation and examination with Meagan [name].  I have also discussed any further diagnostic evaluation thatmay be needed or ordered today. Meagan [name] verbalizes understanding of the findings of todays visit. We also reviewed her medications today and discussed drug interactions and side effects including but not limited excessive drowsiness and altered mental states. We also discussed that there is always a risk not just to her but also people around her. she has been encouraged to call the office with any questions or concerns that should arise related to todays visit.  No orders of the defined types were placed in this encounter.    Time spent: 30 This patient was seen by Orson Gear AGNP-C in Collaboration with Dr. Devona Konig as a part of collaborative care agreement.   I have personally obtained a history, examined the patient, evaluated laboratory and imaging results, formulated the assessment and plan and placed orders.    Allyne Gee, MD [facility] Pulmonary and Critical Care Sleep medicine

## 2019-12-29 ENCOUNTER — Ambulatory Visit: Payer: BC Managed Care – PPO | Attending: Internal Medicine

## 2019-12-29 ENCOUNTER — Telehealth: Payer: Self-pay

## 2019-12-29 DIAGNOSIS — Z23 Encounter for immunization: Secondary | ICD-10-CM

## 2019-12-29 NOTE — Progress Notes (Signed)
   Covid-19 Vaccination Clinic  Name:  Meagan [name]    MRN: [id number] DOB: [birth year]  12/29/2019  [name] was observed post Covid-19 immunization for 30 minutes based on pre-vaccination screening without incident. She was provided with Vaccine Information Sheet and instruction to access the V-Safe system.   [name] was instructed to call 911 with any severe reactions post vaccine: Marland Kitchen Difficulty breathing  . Swelling of face and throat  . A fast heartbeat  . A bad rash all over body  . Dizziness and weakness   Immunizations Administered    Name Date Dose VIS Date Route   Pfizer COVID-19 Vaccine 12/29/2019 10:41 AM 0.3 mL 09/18/2019 Intramuscular   Manufacturer: [place]   Lot: [id number]   [place]: [id number]

## 2019-12-29 NOTE — Telephone Encounter (Signed)
Left a message and advised pt regarding cpap downloads and supplies and its best for pt to see [employer] every few months to make sure supplies are good and cpap machine getting cleaned properly and follow up on downloads. Meagan [name]

## 2020-01-02 ENCOUNTER — Ambulatory Visit: Payer: BC Managed Care – PPO

## 2020-01-06 ENCOUNTER — Ambulatory Visit: Payer: BC Managed Care – PPO

## 2020-01-12 ENCOUNTER — Ambulatory Visit: Payer: BC Managed Care – PPO

## 2020-01-15 ENCOUNTER — Ambulatory Visit (INDEPENDENT_AMBULATORY_CARE_PROVIDER_SITE_OTHER): Payer: BC Managed Care – PPO | Admitting: Family Medicine

## 2020-01-15 ENCOUNTER — Encounter: Payer: Self-pay | Admitting: Family Medicine

## 2020-01-15 ENCOUNTER — Other Ambulatory Visit: Payer: Self-pay

## 2020-01-15 VITALS — BP 138/78 | HR 89 | Temp 97.1°F | Resp 16 | Ht 64.5 in | Wt 193.4 lb

## 2020-01-15 DIAGNOSIS — E785 Hyperlipidemia, unspecified: Secondary | ICD-10-CM | POA: Diagnosis not present

## 2020-01-15 DIAGNOSIS — L600 Ingrowing nail: Secondary | ICD-10-CM | POA: Diagnosis not present

## 2020-01-15 DIAGNOSIS — E440 Moderate protein-calorie malnutrition: Secondary | ICD-10-CM

## 2020-01-15 DIAGNOSIS — E1169 Type 2 diabetes mellitus with other specified complication: Secondary | ICD-10-CM | POA: Diagnosis not present

## 2020-01-15 NOTE — Progress Notes (Signed)
Name: Meagan [name]   MRN: [id number]    DOB: [birth year]   Date:01/15/2020       Progress Note  Subjective  Chief Complaint  Chief Complaint  Patient presents with  . Nail Problem    She may have an infection in her right great toe. She was getting a pedicure on Monday and pus was oozing out of it. She wants a referral to a podiatrist.    HPI   Ingrown toenail: [name] has DM and went to get her nails done this past week. She was told to follow up with PCP because she has an ingrown toe nail that was oozing. She states initially she had some pain, however the clear oozing is clearing and pain is no longer present. She denies fever, chills , leg pain or redness  DMII: she states since she stopped Ozempic she has been able to eat again, her weigh tis up, however her glucose is getting higher. Around 150-160's fasting over the past couple of weeks. Discussed going up on Levermir by 2 units every 3 days to keep fasting between 100 -140 . She denies polyphagia, polydipsia or polyuria. She is happy she can eat again, and is feeling stronger since she started to gain weight.   Patient Active Problem List   Diagnosis Date Noted  . History of CVA with residual deficit 07/01/2019  . Leukocytosis 07/01/2019  . Polycythemia 07/01/2019  . Morbid obesity ([place]) 10/22/2018  . Non compliance with medical treatment 03/26/2018  . Dyslipidemia associated with type 2 diabetes mellitus ([place]) 12/04/2016  . Uncontrolled type 2 diabetes mellitus with microalbuminuria, without long-term current use of insulin ([place]) 12/04/2016  . Seasonal allergies 02/01/2016  . OSA (obstructive sleep apnea) 10/06/2015  . Hypertensive retinopathy 10/06/2015  . Thyroid cyst 10/06/2015  . Dyslipidemia 10/06/2015  . Type 2 diabetes, uncontrolled, with neuropathy ([place]) 10/06/2015  . History of pneumonia 10/06/2015  . Chronic neck pain 10/06/2015  . Carotid stenosis   . Hypertension, benign      Past Surgical History:   Procedure Laterality Date  . APPENDECTOMY  2000s  . BIOPSY THYROID  2006   [facility]  . BREAST EXCISIONAL BIOPSY Left 2000s   neg  . CARPAL TUNNEL RELEASE Left March 2015  . COLONOSCOPY  2011   DR.WOHL  . COLONOSCOPY WITH PROPOFOL N/A 07/11/2017   Procedure: COLONOSCOPY WITH PROPOFOL;  Surgeon: Jonathon Bellows, MD;  Location: [facility] ENDOSCOPY;  Service: Gastroenterology;  Laterality: N/A;  . ganglion cyst removal     . KNEE SURGERY Left 08/2009  . KNEE SURGERY Right 04/07/2010  . ROTATOR CUFF REPAIR Right 2008  . TUBAL LIGATION      Family History  Problem Relation Age of Onset  . Breast cancer Maternal Aunt   . Hypertension Mother   . Kidney Stones Mother   . Hypertension Father   . Congestive Heart Failure Father   . Hypertension Paternal Grandfather   . Diabetes Sister   . Vision loss Sister   . Varicose Veins Sister   . Hypertension Sister   . Breast cancer Sister 57       Double Mastectomy  . Hypertension Sister     Social History   Tobacco Use  . Smoking status: Never Smoker  . Smokeless tobacco: Never Used  Substance Use Topics  . Alcohol use: No     Current Outpatient Medications:  .  amLODipine (NORVASC) 5 MG tablet, Take 1 tablet (5 mg total) by mouth daily.,  Disp: 90 tablet, Rfl: 1 .  atorvastatin (LIPITOR) 80 MG tablet, Take 1 tablet (80 mg total) by mouth daily at 6 PM., Disp: 90 tablet, Rfl: 1 .  blood glucose meter kit and supplies, Dispense based on patient and insurance preference. Use up to four times daily as directed. (FOR ICD-10 E10.9, E11.9)., Disp: 1 each, Rfl: 0 .  clopidogrel (PLAVIX) 75 MG tablet, Take 1 tablet (75 mg total) by mouth daily., Disp: 90 tablet, Rfl: 1 .  fluticasone (FLONASE) 50 MCG/ACT nasal spray, Place 1 spray into both nostrils daily as needed for allergies or rhinitis., Disp: , Rfl:  .  glucose blood (ONETOUCH ULTRA) test strip, USE UP TO 4 TIMES DAILY AS DIRECTED, Disp: 200 strip, Rfl: 2 .  hydrALAZINE (APRESOLINE) 10 MG tablet,  Take 1 tablet (10 mg total) by mouth 3 (three) times daily as needed. If bp goes above 150/90 (Patient taking differently: Take 10 mg by mouth 3 (three) times daily as needed ("if B/P goes above 150/90"). ), Disp: 30 tablet, Rfl: 0 .  Insulin Detemir (LEVEMIR FLEXTOUCH) 100 UNIT/ML Pen, Inject 25-50 Units into the skin daily. (Patient taking differently: Inject 17 Units into the skin daily after breakfast. ), Disp: 15 mL, Rfl: 2 .  Insulin Pen Needle 32G X 4 MM MISC, 1 each by Does not apply route daily., Disp: 100 each, Rfl: 11 .  levocetirizine (XYZAL) 5 MG tablet, Take 5 mg by mouth daily as needed for allergies. , Disp: , Rfl:  .  olmesartan-hydrochlorothiazide (BENICAR HCT) 40-25 MG tablet, Take 0.5 tablets by mouth daily., Disp: 30 tablet, Rfl: 0 .  polyethylene glycol (MIRALAX / GLYCOLAX) 17 g packet, Take 17 g by mouth daily. (Patient not taking: Reported on 01/15/2020), Disp: 14 each, Rfl: 0 .  senna-docusate (SENOKOT-S) 8.6-50 MG tablet, Take 2 tablets by mouth daily as needed for mild constipation or moderate constipation. (Patient not taking: Reported on 01/15/2020), Disp: 30 tablet, Rfl: 1  Allergies  Allergen Reactions  . Bee Venom Swelling and Other (See Comments)    Immediately takes Benadryl to counteract  . Wilder Glade [Dapagliflozin] Rash    Pt reports that she developed a rash when she took [place] and she would like it to be added as an allergy  . Keflex [Cephalexin] Rash    I personally reviewed active problem list, medication list, allergies, family history, social history, health maintenance with the patient/caregiver today.   ROS  Ten systems reviewed and is negative except as mentioned in HPI   Objective  Vitals:   01/15/20 1442  BP: 138/78  Pulse: 89  Resp: 16  Temp: (!) 97.1 F (36.2 C)  TempSrc: Temporal  SpO2: 98%  Weight: 193 lb 6.4 oz (87.7 kg)  Height: 5' 4.5" (1.638 m)    Body mass index is 32.68 kg/m.  Physical Exam  Constitutional: Patient  appears well-developed and well-nourished. Obese No distress.  HEENT: head atraumatic, normocephalic, pupils equal and reactive to light Cardiovascular: Normal rate, regular rhythm and normal heart sounds.  No murmur heard. No BLE edema. Pulmonary/Chest: Effort normal and breath sounds normal. No respiratory distress. Abdominal: Soft.  There is no tenderness. Psychiatric: Patient has a normal mood and affect. behavior is normal. Judgment and thought content normal.  Recent Results (from the past 2160 hour(s))  CBC with Differential/Platelet     Status: Abnormal   Collection Time: 11/10/19  4:21 PM  Result Value Ref Range   WBC 9.9 4.0 - 10.5 K/uL  RBC 3.92 3.87 - 5.11 MIL/uL   Hemoglobin 12.2 12.0 - 15.0 g/dL   HCT 33.8 (L) 36.0 - 46.0 %   MCV 86.2 80.0 - 100.0 fL   MCH 31.1 26.0 - 34.0 pg   MCHC 36.1 (H) 30.0 - 36.0 g/dL   RDW 13.0 11.5 - 15.5 %   Platelets 344 150 - 400 K/uL   nRBC 0.0 0.0 - 0.2 %   Neutrophils Relative % 74 %   Neutro Abs 7.4 1.7 - 7.7 K/uL   Lymphocytes Relative 17 %   Lymphs Abs 1.7 0.7 - 4.0 K/uL   Monocytes Relative 7 %   Monocytes Absolute 0.7 0.1 - 1.0 K/uL   Eosinophils Relative 1 %   Eosinophils Absolute 0.1 0.0 - 0.5 K/uL   Basophils Relative 1 %   Basophils Absolute 0.1 0.0 - 0.1 K/uL   Immature Granulocytes 0 %   Abs Immature Granulocytes 0.03 0.00 - 0.07 K/uL    Comment: Performed at [facility], [address] [place], [place] [address]  Comprehensive metabolic panel     Status: Abnormal   Collection Time: 11/10/19  4:21 PM  Result Value Ref Range   Sodium 131 (L) 135 - 145 mmol/L   Potassium 3.2 (L) 3.5 - 5.1 mmol/L   Chloride 88 (L) 98 - 111 mmol/L   CO2 28 22 - 32 mmol/L   Glucose, Bld 131 (H) 70 - 99 mg/dL   BUN 13 8 - 23 mg/dL   Creatinine, Ser 0.99 0.44 - 1.00 mg/dL   Calcium 9.3 8.9 - 10.3 mg/dL   Total Protein 8.3 (H) 6.5 - 8.1 g/dL   Albumin 4.2 3.5 - 5.0 g/dL   AST 33 15 - 41 U/L   ALT 26 0 - 44 U/L   Alkaline  Phosphatase 75 38 - 126 U/L   Total Bilirubin 0.9 0.3 - 1.2 mg/dL   GFR calc non Af Amer 59 (L) >60 mL/min   GFR calc Af Amer >60 >60 mL/min   Anion gap 15 5 - 15    Comment: Performed at [facility], [address] [place], [place] [address]  Troponin I (High Sensitivity)     Status: None   Collection Time: 11/10/19  4:21 PM  Result Value Ref Range   Troponin I (High Sensitivity) 8 <18 ng/L    Comment: (NOTE) Elevated high sensitivity troponin I (hsTnI) values and significant  changes across serial measurements may suggest ACS but many other  chronic and acute conditions are known to elevate hsTnI results.  Refer to the "Links" section for chest pain algorithms and additional  guidance. Performed at [facility], [address] [place],  [address]   Urinalysis, Complete w Microscopic     Status: Abnormal   Collection Time: 11/10/19  5:12 PM  Result Value Ref Range   Color, Urine YELLOW (A) YELLOW   APPearance HAZY (A) CLEAR   Specific Gravity, Urine 1.013 1.005 - 1.030   pH 6.0 5.0 - 8.0   Glucose, UA NEGATIVE NEGATIVE mg/dL   Hgb urine dipstick NEGATIVE NEGATIVE   Bilirubin Urine NEGATIVE NEGATIVE   Ketones, ur 20 (A) NEGATIVE mg/dL   Protein, ur 100 (A) NEGATIVE mg/dL   Nitrite NEGATIVE NEGATIVE   Leukocytes,Ua LARGE (A) NEGATIVE   RBC / HPF 0-5 0 - 5 RBC/hpf   WBC, UA >50 (H) 0 - 5 WBC/hpf   Bacteria, UA RARE (A) NONE SEEN   Squamous Epithelial / LPF  0-5 0 - 5   Mucus PRESENT    Hyaline Casts, UA PRESENT     Comment: Performed at [facility], [address], [place], [place] [address]  Urine culture     Status: Abnormal   Collection Time: 11/10/19  5:12 PM   Specimen: Urine, Clean Catch  Result Value Ref Range   Specimen Description      URINE, CLEAN CATCH Performed at [facility], [address], [place], [place] [address]    Special Requests      NONE Performed at [facility], [place], [place] [address]    Culture (A)     <10,000 COLONIES/mL INSIGNIFICANT GROWTH Performed at [facility], [place] [address] [place], [place] [address]    Report Status 11/11/2019 FINAL   Troponin I (High Sensitivity)     Status: None   Collection Time: 11/10/19  6:10 PM  Result Value Ref Range   Troponin I (High Sensitivity) 8 <18 ng/L    Comment: (NOTE) Elevated high sensitivity troponin I (hsTnI) values and significant  changes across serial measurements may suggest ACS but many other  chronic and acute conditions are known to elevate hsTnI results.  Refer to the "Links" section for chest pain algorithms and additional  guidance. Performed at [facility], [address] [place], [place] [address]   POC SARS Coronavirus 2 Ag     Status: None   Collection Time: 11/10/19  6:18 PM  Result Value Ref Range   SARS Coronavirus 2 Ag NEGATIVE NEGATIVE    Comment: (NOTE) SARS-CoV-2 antigen NOT DETECTED.  Negative results are presumptive.  Negative results do not preclude SARS-CoV-2 infection and should not be used as the sole basis for treatment or other patient management decisions, including infection  control decisions, particularly in the presence of clinical signs and  symptoms consistent with COVID-19, or in those who have been in contact with the virus.  Negative results must be combined with clinical observations, patient history, and epidemiological information. The expected result is Negative. Fact Sheet for Patients: PodPark.tn Fact Sheet for Healthcare Providers: GiftContent.is This test is not yet approved or cleared by the [place] FDA and  has been authorized for detection and/or diagnosis of SARS-CoV-2 by FDA under an Emergency Use Authorization (EUA).  This EUA will remain in effect (meaning this test can be used) for the duration of  the COVID-19 de claration under Section 564(b)(1) of  the Act, 21 U.S.C. section 360bbb-3(b)(1), unless the authorization is terminated or revoked sooner.   Lipase, blood     Status: Abnormal   Collection Time: 12/02/19  5:38 PM  Result Value Ref Range   Lipase 59 (H) 11 - 51 U/L    Comment: Performed at [facility], [address] [place],  [address]  Comprehensive metabolic panel     Status: Abnormal   Collection Time: 12/02/19  5:38 PM  Result Value Ref Range   Sodium 136 135 - 145 mmol/L   Potassium 3.8 3.5 - 5.1 mmol/L   Chloride 98 98 - 111 mmol/L   CO2 29 22 - 32 mmol/L   Glucose, Bld 119 (H) 70 - 99 mg/dL    Comment: Glucose reference range applies only to samples taken after fasting for at least 8 hours.   BUN 22 8 - 23 mg/dL   Creatinine, Ser 0.94 0.44 - 1.00 mg/dL   Calcium 9.5 8.9 - 10.3 mg/dL  Total Protein 7.7 6.5 - 8.1 g/dL   Albumin 3.8 3.5 - 5.0 g/dL   AST 23 15 - 41 U/L   ALT 20 0 - 44 U/L   Alkaline Phosphatase 86 38 - 126 U/L   Total Bilirubin 0.8 0.3 - 1.2 mg/dL   GFR calc non Af Amer >60 >60 mL/min   GFR calc Af Amer >60 >60 mL/min   Anion gap 9 5 - 15    Comment: Performed at [facility], [address] [place], [place] [address]  CBC     Status: Abnormal   Collection Time: 12/02/19  5:38 PM  Result Value Ref Range   WBC 12.8 (H) 4.0 - 10.5 K/uL   RBC 3.79 (L) 3.87 - 5.11 MIL/uL   Hemoglobin 11.8 (L) 12.0 - 15.0 g/dL   HCT 34.3 (L) 36.0 - 46.0 %   MCV 90.5 80.0 - 100.0 fL   MCH 31.1 26.0 - 34.0 pg   MCHC 34.4 30.0 - 36.0 g/dL   RDW 13.2 11.5 - 15.5 %   Platelets 367 150 - 400 K/uL   nRBC 0.0 0.0 - 0.2 %    Comment: Performed at [facility], [address] [place], [place] [address]  Urinalysis, Complete w Microscopic     Status: Abnormal   Collection Time: 12/02/19  7:19 PM  Result Value Ref Range   Color, Urine YELLOW (A) YELLOW   APPearance CLEAR (A) CLEAR   Specific Gravity, Urine 1.009 1.005 - 1.030   pH 5.0 5.0 - 8.0   Glucose, UA NEGATIVE  NEGATIVE mg/dL   Hgb urine dipstick NEGATIVE NEGATIVE   Bilirubin Urine NEGATIVE NEGATIVE   Ketones, ur NEGATIVE NEGATIVE mg/dL   Protein, ur 30 (A) NEGATIVE mg/dL   Nitrite NEGATIVE NEGATIVE   Leukocytes,Ua NEGATIVE NEGATIVE   RBC / HPF 0-5 0 - 5 RBC/hpf   WBC, UA 0-5 0 - 5 WBC/hpf   Bacteria, UA NONE SEEN NONE SEEN   Squamous Epithelial / LPF 0-5 0 - 5   Mucus PRESENT     Comment: Performed at [facility], [address] [place], [place] [address]    Diabetic Foot Exam: Diabetic Foot Exam - Simple   Simple Foot Form Diabetic Foot exam was performed with the following findings: Yes 01/15/2020  3:04 PM  Visual Inspection See comments: Yes Sensation Testing Intact to touch and monofilament testing bilaterally: Yes Pulse Check Posterior Tibialis and Dorsalis pulse intact bilaterally: Yes Comments Ingrown toe nail right foot, some redness,no pain or oozing right now       PHQ2/9: Depression screen [facility] 2/9 01/15/2020 12/02/2019 11/04/2019 10/07/2019 09/18/2019  Decreased Interest 0 0 0 0 0  Down, Depressed, Hopeless 0 0 0 0 0  PHQ - 2 Score 0 0 0 0 0  Altered sleeping 0 0 0 0 0  Tired, decreased energy 0 0 0 0 0  Change in appetite 0 0 3 0 0  Feeling bad or failure about yourself  0 0 0 0 0  Trouble concentrating 0 0 0 0 0  Moving slowly or fidgety/restless 0 0 0 0 0  Suicidal thoughts 0 0 0 0 0  PHQ-9 Score 0 0 3 0 0  Difficult doing work/chores - - Not difficult at all Not difficult at all -  Some recent data might be hidden    phq 9 is negative   Fall Risk: Fall Risk  01/15/2020 12/02/2019 10/07/2019 09/18/2019 08/24/2019  Falls in the  past year? 0 0 0 0 0  Number falls in past yr: 0 0 0 0 -  Injury with Fall? 0 0 0 0 -  Follow up - - Falls evaluation completed - -     Functional Status Survey: Is the patient deaf or have difficulty hearing?: No Does the patient have difficulty seeing, even when wearing glasses/contacts?: Yes Does the patient have  difficulty concentrating, remembering, or making decisions?: No Does the patient have difficulty walking or climbing stairs?: Yes Does the patient have difficulty dressing or bathing?: No Does the patient have difficulty doing errands alone such as visiting a doctor's office or shopping?: Yes    Assessment & Plan  1. Ingrown nail of great toe of left foot  - Ambulatory referral to Podiatry  2. Dyslipidemia associated with type 2 diabetes mellitus ([place])  - Ambulatory referral to Podiatry  Advised to increase dose of tresiba by 2 units every 3 days to keep glucose between 100-140   3. Moderate protein-calorie malnutrition ([place])  She is doing better, gaining weight since stopped on Ozempic

## 2020-01-15 NOTE — Patient Instructions (Addendum)
Keto desserts such as keto ice cream or Fat Bombs You can also eat one bite of dark chocolate when craving sweets Avoid bread, pasta, rice and any type of sweet beverages Try whisp crackers   Levemir  go up by 2 units every 3 days to keep fasting level between 100-140

## 2020-01-20 ENCOUNTER — Other Ambulatory Visit: Payer: Self-pay | Admitting: Family Medicine

## 2020-01-22 ENCOUNTER — Other Ambulatory Visit: Payer: Self-pay | Admitting: Family Medicine

## 2020-01-22 DIAGNOSIS — I6930 Unspecified sequelae of cerebral infarction: Secondary | ICD-10-CM

## 2020-01-26 ENCOUNTER — Telehealth: Payer: Self-pay

## 2020-01-26 ENCOUNTER — Other Ambulatory Visit: Payer: Self-pay | Admitting: Family Medicine

## 2020-01-26 DIAGNOSIS — I6930 Unspecified sequelae of cerebral infarction: Secondary | ICD-10-CM

## 2020-01-26 NOTE — Telephone Encounter (Signed)
Copied from [place] [id number]. Topic: General - Inquiry >> Jan 25, 2020  3:53 PM Greggory Keen D wrote: Reason for CRM: Juanita with [employer] called asking for a last date seen and next appt date and a return to work date at this time.  CB# [phone] X [phone]

## 2020-01-26 NOTE — Telephone Encounter (Signed)
Confirmed appointment on 01/27/2020 and screened for [email or web address] 

## 2020-01-26 NOTE — Telephone Encounter (Signed)
Pt returned the office call. Pt says based on the letter that she provided [place] [employer] with, she is due to have a ov to determine on  5/25. Pt says at that time a determination will be made.

## 2020-01-26 NOTE — Telephone Encounter (Signed)
Left patient a message asking her estimate return back to work date since [place] [employer] was inquiring about this.

## 2020-01-26 NOTE — Telephone Encounter (Signed)
Spoke with [place] from [facility] and informed them her return back to work date would be determined during her next visit on 03/01/2020

## 2020-01-27 ENCOUNTER — Other Ambulatory Visit: Payer: Self-pay | Admitting: Family Medicine

## 2020-01-27 ENCOUNTER — Ambulatory Visit: Payer: BC Managed Care – PPO

## 2020-01-27 ENCOUNTER — Other Ambulatory Visit: Payer: Self-pay

## 2020-01-27 DIAGNOSIS — I10 Essential (primary) hypertension: Secondary | ICD-10-CM

## 2020-01-27 DIAGNOSIS — G4733 Obstructive sleep apnea (adult) (pediatric): Secondary | ICD-10-CM | POA: Diagnosis not present

## 2020-01-27 NOTE — Progress Notes (Addendum)
95 percentile pressure 14.1   95th percentile leak 34.6   apnea index 0.4 /hr  apnea-hypopnea index  0.7 /hr   total days used  >4 hr 30 days  total days used <4 hr 0 days  Total compliance 100 percent  Pt has questions about the swelling under her eyes.

## 2020-01-27 NOTE — Telephone Encounter (Signed)
Requested Prescriptions  Pending Prescriptions Disp Refills  . amLODipine (NORVASC) 5 MG tablet [Pharmacy Med Name: AMLODIPINE BESYLATE 5 MG TAB] 90 tablet 1    Sig: TAKE 1 TABLET BY MOUTH EVERY DAY     Cardiovascular:  Calcium Channel Blockers Passed - 01/27/2020  1:23 AM      Passed - Last BP in normal range    BP Readings from Last 1 Encounters:  01/15/20 138/78         Passed - Valid encounter within last 6 months    Recent Outpatient Visits          1 week ago Ingrown nail of great toe of left foot   [facility] [place], Drue Stager, MD   1 month ago Dyslipidemia associated with type 2 diabetes mellitus [facility])   [facility] Steele Sizer, MD   2 months ago Moderate protein-calorie malnutrition [facility])   [facility] [place], Drue Stager, MD   3 months ago Uncontrolled type 2 diabetes mellitus with microalbuminuria, without long-term current use of insulin [facility])   [place], FNP   4 months ago History of CVA with residual deficit   [facility] Steele Sizer, MD      Future Appointments            In 1 month Ancil Boozer, Drue Stager, MD [facility]

## 2020-02-08 ENCOUNTER — Other Ambulatory Visit: Payer: Self-pay

## 2020-02-08 ENCOUNTER — Ambulatory Visit: Payer: BC Managed Care – PPO | Admitting: Adult Health

## 2020-02-08 ENCOUNTER — Encounter: Payer: Self-pay | Admitting: Adult Health

## 2020-02-08 VITALS — BP 151/84 | HR 84 | Temp 97.0°F | Ht 64.0 in | Wt 197.0 lb

## 2020-02-08 DIAGNOSIS — I10 Essential (primary) hypertension: Secondary | ICD-10-CM

## 2020-02-08 DIAGNOSIS — E1165 Type 2 diabetes mellitus with hyperglycemia: Secondary | ICD-10-CM

## 2020-02-08 DIAGNOSIS — E785 Hyperlipidemia, unspecified: Secondary | ICD-10-CM | POA: Diagnosis not present

## 2020-02-08 DIAGNOSIS — R809 Proteinuria, unspecified: Secondary | ICD-10-CM

## 2020-02-08 DIAGNOSIS — IMO0002 Reserved for concepts with insufficient information to code with codable children: Secondary | ICD-10-CM

## 2020-02-08 DIAGNOSIS — I63531 Cerebral infarction due to unspecified occlusion or stenosis of right posterior cerebral artery: Secondary | ICD-10-CM

## 2020-02-08 DIAGNOSIS — E1129 Type 2 diabetes mellitus with other diabetic kidney complication: Secondary | ICD-10-CM | POA: Diagnosis not present

## 2020-02-08 DIAGNOSIS — H53462 Homonymous bilateral field defects, left side: Secondary | ICD-10-CM

## 2020-02-08 NOTE — Patient Instructions (Signed)
Continue clopidogrel 75 mg daily  and atorvastatin 80 mg daily for secondary stroke prevention  Continue to follow up with PCP regarding cholesterol, blood pressure and diabetes management   Continue to monitor blood pressure at home and follow-up with PCP with ongoing increased blood pressure results  Please request your eye doctor fax recent office visit results including peripheral vision field test to our office for further review  Maintain strict control of hypertension with blood pressure goal below 130/90, diabetes with hemoglobin A1c goal below 6.5% and cholesterol with LDL cholesterol (bad cholesterol) goal below 70 mg/dL. I also advised the patient to eat a healthy diet with plenty of whole grains, cereals, fruits and vegetables, exercise regularly and maintain ideal body weight.  Followup in the future with me in 6 months or call earlier if needed       Thank you for coming to see [place] at [facility]. I hope we have been able to provide you high quality care today.  You may receive a patient satisfaction survey over the next few weeks. We would appreciate your feedback and comments so that we may continue to improve ourselves and the health of our patients.

## 2020-02-08 NOTE — Progress Notes (Signed)
I agree with the above plan 

## 2020-02-08 NOTE — Progress Notes (Signed)
[facility] [address] [place]. [place] [address] [phone]       STROKE FOLLOW UP NOTE  [name] Date of Birth:  January 28, 1951 Medical Record Number:  [id number]   Reason for Referral: stroke follow up    CHIEF COMPLAINT:  Chief Complaint  Patient presents with  . Follow-up    CVA fu, rm 9,     HPI:  Today, 02/08/2020, Meagan [name] returns for stroke follow-up accompanied by her sister.  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  Recent ophthalmologic bilateral laser surgery for "increased pressure with vessel leakage" and denies benefit of vision improvement.  Continues to work with outpatient therapy with ongoing improvement.  Continues on Plavix and atorvastatin for secondary stroke prevention.  Blood pressure today 151/84.  She is concerned regarding recent elevated blood pressures with ongoing monitoring at home and does continue to follow with PCP for ongoing management.  DM stable.  Continues to follow with PCP regularly.  No further neurological concerns at this time.    History provided for reference purposes only Update 11/11/2019: Meagan [name] is a 69 year old female who is being seen today by request of her sister due to worsening appetite with weight loss. Established patient with prior history of stroke. 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  Continues on plavix and atorvastatin for secondary stroke prevention without side effects.  Blood pressure today 123/78.  30-day cardiac event monitor completed which was negative for atrial fibrillation. Since prior visit, she has had 2 ED admissions both appearing to be due to dehydration and decreased appetite.  Continues to follow with PCP for ongoing monitoring and management with adjustment of medications due to possible side effects. Sister is very concerned  regarding ongoing decreased appetite and weight loss. Denies new or worsening stroke/TIA symptoms.   Initial visit 08/06/2019: Meagan [name] is a 69 year old female who is being seen today for stroke follow-up accompanied by her sister.  She has since been discharged from SNF rehab and currently residing with her sister.  Residual deficits of left homonymous hemianopia, left hemiparesis and delayed recall.  She continues to participate in outpatient PT/OT in [place], [place] with ongoing improvement.  Continues to ambulate with rolling walker.  She denies improvement of her vision.  She has had evaluation by her retina specialist since hospital discharge and has follow-up visit in December.  She does report worsening of vision and headaches with eyestrain.  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  She has not returned to driving due to decreased vision.  She continues on aspirin and Plavix without bleeding or bruising.  Continues on atorvastatin without myalgias.  Blood pressure today 136/89.  She did receive 30-day cardiac event but per patient, has attempted to reach out to her cardiologist in regards to need of monitoring and has not received return phone call.  No further concerns at this time.  Stroke admission 07/01/2019: Meagan [name] is a 69 y.o. female with history of NIDDM, HTN, OSA who had recent eye injections  presented on 07/01/2019 with HA, difficulty looking to the L that did not resolve and L sided numbness since 9/20.  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  MRI showed acute right parieto-occipital junction infarct along with small vessel disease.  MRA head showed arthrosclerosis bilateral PCAs with focal stenosis right P1/P2 junction.  MRA neck motion degraded.  CTA head neck right ICA 45% stenosis, right ECA 60%, left CCA 30% stenosis.  2D echo unremarkable.   Recommended 30-day cardiac event monitor outpatient to rule out atrial fibrillation. LDL 109.  A1c 10.1.  Initiated DAPT for 3 months due to intracranial stenosis and Plavix alone as previously on aspirin.  Uncontrolled DM and recommend close PCP follow-up.  HTN stable.  Change of statin therapy to atorvastatin 80 mg daily.  Other stroke risk factors include advanced age, obesity and OSA but no prior history of stroke.  Residual deficits of left homonymous hemianopia and recommended follow-up with ophthalmology prior to return to driving.  She also had residual right hemiparesis and discharged to SNF on 07/08/2019 for ongoing therapy.      ROS:   14 system review of systems performed and negative with exception of vision impairment, speech difficulty, gait impairment, weakness   PMH:  Past Medical History:  Diagnosis Date  . Cellulitis    left leg  . 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 Diffuse cystic mastopathy   . Fall 2013  . Hypertension 2004  . Occlusion and stenosis of carotid artery without mention of cerebral infarction   . Sleep apnea   . Stroke [facility])     PSH:  Past Surgical History:  Procedure Laterality Date  . APPENDECTOMY  2000s  . BIOPSY THYROID  2006   [facility]  . BREAST EXCISIONAL BIOPSY Left 2000s   neg  . CARPAL TUNNEL RELEASE Left March 2015  . COLONOSCOPY  2011   DR.WOHL  . COLONOSCOPY WITH PROPOFOL N/A 07/11/2017   Procedure: COLONOSCOPY WITH PROPOFOL;  Surgeon: Jonathon Bellows, MD;  Location: [facility] ENDOSCOPY;  Service: Gastroenterology;  Laterality: N/A;  . ganglion cyst removal     . KNEE SURGERY Left 08/2009  . KNEE SURGERY Right 04/07/2010  . ROTATOR CUFF REPAIR Right 2008  . TUBAL LIGATION      Social History:  Social History   Socioeconomic History  . Marital status: Widowed    Spouse name: Not on file  . Number of children: 4  . Years of education: Not on file  . Highest education level: Master's degree (e.g.,  MA, MS, MEng, MEd, MSW, MBA)  Occupational History  . Occupation: Higher education careers adviser   Tobacco Use  . Smoking status: Never Smoker  . Smokeless tobacco: Never Used  Substance and Sexual Activity  . Alcohol use: No  . Drug use: No  . Sexual activity: Not Currently    Partners: Male    Comment: Widow for 14 years  Other Topics Concern  . Not on file  Social History Narrative   She had a stroke on 06/2019, she has left peripheral vision loss since stroke   She moved in with her youngest sister    Social Determinants of Health   Financial Resource Strain:   . Difficulty of Paying Living Expenses:   Food Insecurity:   . Worried About Charity fundraiser in the Last Year:   . Arboriculturist in the Last Year:   Transportation Needs:   . Film/video editor (Medical):   Marland Kitchen Lack of Transportation (Non-Medical):   Physical Activity: Inactive  . Days of Exercise per Week: 0 days  . Minutes of Exercise per Session: 0 min  Stress:   . Feeling of Stress :   Social  Connections:   . Frequency of Communication with Friends and Family:   . Frequency of Social Gatherings with Friends and Family:   . Attends Religious Services:   . Active Member of Clubs or Organizations:   . Attends Archivist Meetings:   Marland Kitchen Marital Status:   Intimate Partner Violence:   . Fear of Current or Ex-Partner:   . Emotionally Abused:   Marland Kitchen Physically Abused:   . Sexually Abused:     Family History:  Family History  Problem Relation Age of Onset  . Breast cancer Maternal Aunt   . Hypertension Mother   . Kidney Stones Mother   . Hypertension Father   . Congestive Heart Failure Father   . Hypertension Paternal Grandfather   . Diabetes Sister   . Vision loss Sister   . Varicose Veins Sister   . Hypertension Sister   . Breast cancer Sister 66       Double Mastectomy  . Hypertension Sister     Medications:   Current Outpatient Medications on File Prior to Visit  Medication Sig Dispense Refill    . amLODipine (NORVASC) 5 MG tablet TAKE 1 TABLET BY MOUTH EVERY DAY 90 tablet 1  . atorvastatin (LIPITOR) 80 MG tablet TAKE 1 TABLET (80 MG TOTAL) BY MOUTH DAILY AT 6 PM. 90 tablet 3  . blood glucose meter kit and supplies Dispense based on patient and insurance preference. Use up to four times daily as directed. (FOR ICD-10 E10.9, E11.9). 1 each 0  . clopidogrel (PLAVIX) 75 MG tablet TAKE 1 TABLET BY MOUTH EVERY DAY 90 tablet 1  . glucose blood (ONETOUCH ULTRA) test strip USE UP TO 4 TIMES DAILY AS DIRECTED 200 strip 2  . hydrALAZINE (APRESOLINE) 10 MG tablet Take 1 tablet (10 mg total) by mouth 3 (three) times daily as needed. If bp goes above 150/90 (Patient taking differently: Take 10 mg by mouth 3 (three) times daily as needed ("if B/P goes above 150/90"). ) 30 tablet 0  . Insulin Detemir (LEVEMIR FLEXTOUCH) 100 UNIT/ML Pen Inject 25-50 Units into the skin daily. (Patient taking differently: Inject 17 Units into the skin daily after breakfast. ) 15 mL 2  . Insulin Pen Needle 32G X 4 MM MISC 1 each by Does not apply route daily. 100 each 11  . levocetirizine (XYZAL) 5 MG tablet Take 5 mg by mouth daily as needed for allergies.     Marland Kitchen olmesartan-hydrochlorothiazide (BENICAR HCT) 40-25 MG tablet Take 0.5 tablets by mouth daily. 30 tablet 0  . polyethylene glycol (MIRALAX / GLYCOLAX) 17 g packet Take 17 g by mouth daily. 14 each 0  . senna-docusate (SENOKOT-S) 8.6-50 MG tablet Take 2 tablets by mouth daily as needed for mild constipation or moderate constipation. 30 tablet 1   No current facility-administered medications on file prior to visit.    Allergies:   Allergies  Allergen Reactions  . Bee Venom Swelling and Other (See Comments)    Immediately takes Benadryl to counteract  . Wilder Glade [Dapagliflozin] Rash    Pt reports that she developed a rash when she took [place] and she would like it to be added as an allergy  . Keflex [Cephalexin] Rash     Physical Exam  Vitals:   02/08/20  1543  BP: (!) 151/84  Pulse: 84  Temp: (!) 97 F (36.1 C)  Weight: 197 lb (89.4 kg)  Height: '5\' 4"'  (1.626 m)   Body mass index is 33.81 kg/m. No exam  data present   General: well developed, well nourished, pleasant middle-aged African-American female, seated, in no evident distress Head: head normocephalic and atraumatic.   Neck: supple with no carotid or supraclavicular bruits Cardiovascular: regular rate and rhythm, no murmurs Musculoskeletal: no deformity Skin:  no rash/petichiae Vascular:  Normal pulses all extremities   Neurologic Exam Mental Status: Awake and fully alert.   Mild dysarthria. Oriented to place and time. Recent and remote memory intact. Attention span, concentration and fund of knowledge appropriate but delayed. Mood and affect appropriate.  Cranial Nerves: Pupils equal, briskly reactive to light. Extraocular movements full without nystagmus. Visual fields left homonymous hemianopia.  Hearing intact. Facial sensation intact.  Mild left lower facial weakness.  Motor: Normal bulk and tone. Normal strength in all tested extremity muscles except for mild left hip flexor weakness.  Sensory.: intact to pinprick , position and vibratory sensation with slightly decreased sensation left side.  Coordination: Rapid alternating movements normal in all extremities except slightly decreased left hand dexterity. Finger-to-nose and heel-to-shin performed with ataxia on left side. Gait and Station: Arises from chair without difficulty. Stance is normal. Gait demonstrates normal stride length and balance with use of rolling walker Reflexes: 1+ and symmetric. Toes downgoing.        ASSESSMENT: Meagan [name] is a 68 y.o. 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 Vascular risk factors include HTN, HLD, DM, OSA and intercranial stenosis.  Residual  deficits of mild dysarthria, left-sided ataxia, gait impairment and left homonymous hemianopia which has been stable.    PLAN:  1. Right PCA stroke:  -Recommend ongoing participation in outpatient therapies as patient endorses continued benefit.  Advised to continue to follow with ophthalmology as well as request office visit notes be faxed to office -Continue clopidogrel 75 mg daily  and atorvastatin for secondary stroke prevention.  Request ongoing prescribing by PCP.  Maintain strict control of hypertension with blood pressure goal below 130/90, diabetes with hemoglobin A1c goal below 6.5% and cholesterol with LDL cholesterol (bad cholesterol) goal below 70 mg/dL.  I also advised the patient to eat a healthy diet with plenty of whole grains, cereals, fruits and vegetables, exercise regularly with at least 30 minutes of continuous activity daily and maintain ideal body weight. 2. HTN: Stable.  Continue to follow with PCP for monitoring management 3. HLD: Continuation of atorvastatin and ongoing follow-up with PCP for prescribing, monitoring and management 4. DMII: Continue to follow with PCP for monitoring and management    Follow up in 6  months or call earlier if needed  I spent 35 minutes of face-to-face and non-face-to-face time with patient and sister.  This included previsit chart review, lab review, study review, order entry, electronic health record documentation, patient education regarding prior stroke, long discussion regarding residual deficits and ongoing visual difficulties, importance of managing stroke risk factors and answered all questions to patient satisfaction    Frann Rider, [facility]  [facility] [address] [place], [place] [address]  Phone [phone] Fax [phone] Note: This document was prepared with digital dictation and possible smart phrase technology. Any transcriptional errors that result from this process are  unintentional.

## 2020-02-29 ENCOUNTER — Encounter: Payer: Self-pay | Admitting: Family Medicine

## 2020-03-01 ENCOUNTER — Ambulatory Visit: Payer: BC Managed Care – PPO | Admitting: Family Medicine

## 2020-03-01 ENCOUNTER — Encounter: Payer: Self-pay | Admitting: Family Medicine

## 2020-03-01 ENCOUNTER — Other Ambulatory Visit: Payer: Self-pay

## 2020-03-01 VITALS — BP 130/76 | HR 91 | Temp 97.9°F | Resp 16 | Ht 64.0 in | Wt 199.0 lb

## 2020-03-01 DIAGNOSIS — G4733 Obstructive sleep apnea (adult) (pediatric): Secondary | ICD-10-CM

## 2020-03-01 DIAGNOSIS — I6930 Unspecified sequelae of cerebral infarction: Secondary | ICD-10-CM

## 2020-03-01 DIAGNOSIS — I10 Essential (primary) hypertension: Secondary | ICD-10-CM

## 2020-03-01 DIAGNOSIS — E1159 Type 2 diabetes mellitus with other circulatory complications: Secondary | ICD-10-CM

## 2020-03-01 DIAGNOSIS — E1169 Type 2 diabetes mellitus with other specified complication: Secondary | ICD-10-CM | POA: Diagnosis not present

## 2020-03-01 DIAGNOSIS — Z23 Encounter for immunization: Secondary | ICD-10-CM

## 2020-03-01 DIAGNOSIS — E785 Hyperlipidemia, unspecified: Secondary | ICD-10-CM

## 2020-03-01 DIAGNOSIS — Z1231 Encounter for screening mammogram for malignant neoplasm of breast: Secondary | ICD-10-CM

## 2020-03-01 DIAGNOSIS — E2839 Other primary ovarian failure: Secondary | ICD-10-CM

## 2020-03-01 LAB — POCT GLYCOSYLATED HEMOGLOBIN (HGB A1C): Hemoglobin A1C: 7 % — AB (ref 4.0–5.6)

## 2020-03-01 MED ORDER — OLMESARTAN MEDOXOMIL-HCTZ 40-12.5 MG PO TABS: 1.0000 | ORAL_TABLET | Freq: Every day | ORAL | 1 refills | Status: DC

## 2020-03-01 MED ORDER — HYDRALAZINE HCL 10 MG PO TABS: 10.0000 mg | ORAL_TABLET | Freq: Three times a day (TID) | ORAL | 0 refills | Status: DC | PRN

## 2020-03-01 MED ORDER — RYBELSUS 7 MG PO TABS: 1.0000 | ORAL_TABLET | Freq: Every day | ORAL | 1 refills | Status: DC

## 2020-03-01 NOTE — Progress Notes (Signed)
Name: Meagan [name]   MRN: [id number]    DOB: October 21, 1950   Date:03/01/2020       Progress Note  Subjective  Chief Complaint  Chief Complaint  Patient presents with  . Follow-up    discuss return to work    HPI  DMII: she states since she stopped Ozempic she has been able to eat again, her weigh tis up, however her glucose has been in the 150's fasting, stay on 18 units of Levemir she would like to control her appetite and asked to resume Ozempic, explained that I am afraid of doing so since it caused significant lack of appetite, we will try Rybelsus instead  Start at 3 mg and go up to 7 mg and monitor for lack of appetite, if needed we can try going back on Farxiga/Jardiance - she is afraid of yeast vaginitis.  She denies polyphagia, polydipsia or polyuria. Drinking plenty of water, and trying to follow a diabetic diet   S/p CVA: date of CVA was 07/01/2019, admitted from 07/01/2019 until 07/08/2019 and had inpatient rehab until  07/17/2019. She developed right occipital headaches and bp was spiking . She went to eye doctor and bp was over 200's , he called me and we advised to send her to [facility], she arrived to [facility] with her daughter. CTA showed 45 % stenosis proximal right internal carotid artery , 60 % stenosis right external carotid artery , left distal common external carotid was 30 % , echo showed normal EF, LDL was very high at 199 and during visit it was changed from Crestor to Lipitor 80. She is also on Plavix 75 mg and aspirin 325 mg . No side effects of medication. She initially lost vision bilaterally and has some dysarthria, but visionis unchanged since last visit with me, she does not haveperipheral vision onleft side, still seeing retina sub-specialist and is still unable to drive, using a walker , still has difficulty coming up with her words. She has not recovered in 6 months to a level to be able to go back to work. We will send note to her employer.   HTN: bp at home has been  mostly in the 140's range in am's, sister started giving her hydralazine daily and we will change benicar hctz from 40/25 to 40/12.5 mg and monitor  No chest pain, palpitation or SOB.   OSA: seen by Dr. Humphrey Rolls and had sleep study, wearing cpap every night, tolerating it well.   Patient Active Problem List   Diagnosis Date Noted  . History of CVA with residual deficit 07/01/2019  . Leukocytosis 07/01/2019  . Polycythemia 07/01/2019  . Morbid obesity ([place]) 10/22/2018  . Non compliance with medical treatment 03/26/2018  . Dyslipidemia associated with type 2 diabetes mellitus ([place]) 12/04/2016  . Uncontrolled type 2 diabetes mellitus with microalbuminuria, without long-term current use of insulin ([place]) 12/04/2016  . Seasonal allergies 02/01/2016  . OSA (obstructive sleep apnea) 10/06/2015  . Hypertensive retinopathy 10/06/2015  . Thyroid cyst 10/06/2015  . Dyslipidemia 10/06/2015  . Type 2 diabetes, uncontrolled, with neuropathy ([facility]) 10/06/2015  . History of pneumonia 10/06/2015  . Chronic neck pain 10/06/2015  . Carotid stenosis   . Hypertension, benign     Past Surgical History:  Procedure Laterality Date  . APPENDECTOMY  2000s  . BIOPSY THYROID  2006   [facility]  . BREAST EXCISIONAL BIOPSY Left 2000s   neg  . CARPAL TUNNEL RELEASE Left March 2015  . COLONOSCOPY  2011  DR.WOHL  . COLONOSCOPY WITH PROPOFOL N/A 07/11/2017   Procedure: COLONOSCOPY WITH PROPOFOL;  Surgeon: Jonathon Bellows, MD;  Location: [facility] ENDOSCOPY;  Service: Gastroenterology;  Laterality: N/A;  . ganglion cyst removal     . KNEE SURGERY Left 08/2009  . KNEE SURGERY Right 04/07/2010  . ROTATOR CUFF REPAIR Right 2008  . TUBAL LIGATION      Family History  Problem Relation Age of Onset  . Breast cancer Maternal Aunt   . Hypertension Mother   . Kidney Stones Mother   . Hypertension Father   . Congestive Heart Failure Father   . Hypertension Paternal Grandfather   . Diabetes Sister   . Vision loss Sister   .  Varicose Veins Sister   . Hypertension Sister   . Breast cancer Sister 64       Double Mastectomy  . Hypertension Sister     Social History   Tobacco Use  . Smoking status: Never Smoker  . Smokeless tobacco: Never Used  Substance Use Topics  . Alcohol use: No     Current Outpatient Medications:  .  amLODipine (NORVASC) 5 MG tablet, TAKE 1 TABLET BY MOUTH EVERY DAY, Disp: 90 tablet, Rfl: 1 .  atorvastatin (LIPITOR) 80 MG tablet, TAKE 1 TABLET (80 MG TOTAL) BY MOUTH DAILY AT 6 PM., Disp: 90 tablet, Rfl: 3 .  blood glucose meter kit and supplies, Dispense based on patient and insurance preference. Use up to four times daily as directed. (FOR ICD-10 E10.9, E11.9)., Disp: 1 each, Rfl: 0 .  clopidogrel (PLAVIX) 75 MG tablet, TAKE 1 TABLET BY MOUTH EVERY DAY, Disp: 90 tablet, Rfl: 1 .  glucose blood (ONETOUCH ULTRA) test strip, USE UP TO 4 TIMES DAILY AS DIRECTED, Disp: 200 strip, Rfl: 2 .  hydrALAZINE (APRESOLINE) 10 MG tablet, Take 1 tablet (10 mg total) by mouth 3 (three) times daily as needed. If bp goes above 150/90 (Patient taking differently: Take 10 mg by mouth 3 (three) times daily as needed ("if B/P goes above 150/90"). ), Disp: 30 tablet, Rfl: 0 .  Insulin Detemir (LEVEMIR FLEXTOUCH) 100 UNIT/ML Pen, Inject 25-50 Units into the skin daily. (Patient taking differently: Inject 17 Units into the skin daily after breakfast. ), Disp: 15 mL, Rfl: 2 .  Insulin Pen Needle 32G X 4 MM MISC, 1 each by Does not apply route daily., Disp: 100 each, Rfl: 11 .  levocetirizine (XYZAL) 5 MG tablet, Take 5 mg by mouth daily as needed for allergies. , Disp: , Rfl:  .  olmesartan-hydrochlorothiazide (BENICAR HCT) 40-25 MG tablet, Take 0.5 tablets by mouth daily., Disp: 30 tablet, Rfl: 0 .  polyethylene glycol (MIRALAX / GLYCOLAX) 17 g packet, Take 17 g by mouth daily., Disp: 14 each, Rfl: 0 .  senna-docusate (SENOKOT-S) 8.6-50 MG tablet, Take 2 tablets by mouth daily as needed for mild constipation or  moderate constipation., Disp: 30 tablet, Rfl: 1  Allergies  Allergen Reactions  . Bee Venom Swelling and Other (See Comments)    Immediately takes Benadryl to counteract  . Wilder Glade [Dapagliflozin] Rash    Pt reports that she developed a rash when she took [place] and she would like it to be added as an allergy  . Keflex [Cephalexin] Rash    I personally reviewed active problem list, medication list, allergies, family history, social history, health maintenance with the patient/caregiver today.   ROS  Constitutional: Negative for fever, positive for  weight change.  Respiratory: Negative for cough and shortness  of breath.   Cardiovascular: Negative for chest pain or palpitations.  Gastrointestinal: Negative for abdominal pain, no bowel changes.  Musculoskeletal: Positive for gait problem but no  joint swelling.  Skin: Negative for rash.  Neurological: Negative for dizziness or headache.  No other specific complaints in a complete review of systems (except as listed in HPI above).  Objective  Vitals:   03/01/20 1418  BP: 130/76  Pulse: 91  Resp: 16  Temp: 97.9 F (36.6 C)  TempSrc: Temporal  SpO2: 97%  Weight: 199 lb (90.3 kg)  Height: '5\' 4"'  (1.626 m)    Body mass index is 34.16 kg/m.  Physical Exam  Constitutional: Patient appears well-developed and well-nourished.  No distress.  HEENT: head atraumatic, normocephalic, pupils equal and reactive to light,  neck supple Cardiovascular: Normal rate, regular rhythm and normal heart sounds.  No murmur heard. No BLE edema. Pulmonary/Chest: Effort normal and breath sounds normal. No respiratory distress. Abdominal: Soft.  There is no tenderness. Psychiatric: Patient has a normal mood and affect. behavior is normal. Judgment and thought content normal. Neurological: still has left side weakness, uses a walker, still has episodes of dysarthria , she also has some mild cognitive dysfunction since stroke.   Recent Results (from  the past 2160 hour(s))  Lipase, blood     Status: Abnormal   Collection Time: 12/02/19  5:38 PM  Result Value Ref Range   Lipase 59 (H) 11 - 51 U/L    Comment: Performed at [facility], [address] [place], [place] [address]  Comprehensive metabolic panel     Status: Abnormal   Collection Time: 12/02/19  5:38 PM  Result Value Ref Range   Sodium 136 135 - 145 mmol/L   Potassium 3.8 3.5 - 5.1 mmol/L   Chloride 98 98 - 111 mmol/L   CO2 29 22 - 32 mmol/L   Glucose, Bld 119 (H) 70 - 99 mg/dL    Comment: Glucose reference range applies only to samples taken after fasting for at least 8 hours.   BUN 22 8 - 23 mg/dL   Creatinine, Ser 0.94 0.44 - 1.00 mg/dL   Calcium 9.5 8.9 - 10.3 mg/dL   Total Protein 7.7 6.5 - 8.1 g/dL   Albumin 3.8 3.5 - 5.0 g/dL   AST 23 15 - 41 U/L   ALT 20 0 - 44 U/L   Alkaline Phosphatase 86 38 - 126 U/L   Total Bilirubin 0.8 0.3 - 1.2 mg/dL   GFR calc non Af Amer >60 >60 mL/min   GFR calc Af Amer >60 >60 mL/min   Anion gap 9 5 - 15    Comment: Performed at [facility], [address] [place], [place] [address]  CBC     Status: Abnormal   Collection Time: 12/02/19  5:38 PM  Result Value Ref Range   WBC 12.8 (H) 4.0 - 10.5 K/uL   RBC 3.79 (L) 3.87 - 5.11 MIL/uL   Hemoglobin 11.8 (L) 12.0 - 15.0 g/dL   HCT 34.3 (L) 36.0 - 46.0 %   MCV 90.5 80.0 - 100.0 fL   MCH 31.1 26.0 - 34.0 pg   MCHC 34.4 30.0 - 36.0 g/dL   RDW 13.2 11.5 - 15.5 %   Platelets 367 150 - 400 K/uL   nRBC 0.0 0.0 - 0.2 %    Comment: Performed at [facility], [address], [place], [place] [address]  Urinalysis, Complete w Microscopic  Status: Abnormal   Collection Time: 12/02/19  7:19 PM  Result Value Ref Range   Color, Urine YELLOW (A) YELLOW   APPearance CLEAR (A) CLEAR   Specific Gravity, Urine 1.009 1.005 - 1.030   pH 5.0 5.0 - 8.0   Glucose, UA NEGATIVE NEGATIVE mg/dL   Hgb urine dipstick NEGATIVE NEGATIVE   Bilirubin Urine NEGATIVE  NEGATIVE   Ketones, ur NEGATIVE NEGATIVE mg/dL   Protein, ur 30 (A) NEGATIVE mg/dL   Nitrite NEGATIVE NEGATIVE   Leukocytes,Ua NEGATIVE NEGATIVE   RBC / HPF 0-5 0 - 5 RBC/hpf   WBC, UA 0-5 0 - 5 WBC/hpf   Bacteria, UA NONE SEEN NONE SEEN   Squamous Epithelial / LPF 0-5 0 - 5   Mucus PRESENT     Comment: Performed at [facility], [address] [place], [place] [address]    PHQ2/9: Depression screen [facility] 2/9 03/01/2020 01/15/2020 12/02/2019 11/04/2019 10/07/2019  Decreased Interest 0 0 0 0 0  Down, Depressed, Hopeless 0 0 0 0 0  PHQ - 2 Score 0 0 0 0 0  Altered sleeping 0 0 0 0 0  Tired, decreased energy 0 0 0 0 0  Change in appetite 0 0 0 3 0  Feeling bad or failure about yourself  0 0 0 0 0  Trouble concentrating 0 0 0 0 0  Moving slowly or fidgety/restless 0 0 0 0 0  Suicidal thoughts 0 0 0 0 0  PHQ-9 Score 0 0 0 3 0  Difficult doing work/chores Not difficult at all - - Not difficult at all Not difficult at all  Some recent data might be hidden    phq 9 is negative   Fall Risk: Fall Risk  03/01/2020 01/15/2020 12/02/2019 10/07/2019 09/18/2019  Falls in the past year? 0 0 0 0 0  Number falls in past yr: 0 0 0 0 0  Injury with Fall? 0 0 0 0 0  Follow up Falls evaluation completed - - Falls evaluation completed -     Functional Status Survey: Is the patient deaf or have difficulty hearing?: Yes Does the patient have difficulty seeing, even when wearing glasses/contacts?: Yes Does the patient have difficulty concentrating, remembering, or making decisions?: No Does the patient have difficulty walking or climbing stairs?: Yes Does the patient have difficulty dressing or bathing?: No Does the patient have difficulty doing errands alone such as visiting a doctor's office or shopping?: No    Assessment & Plan  1. Hypertension, benign  - olmesartan-hydrochlorothiazide (BENICAR HCT) 40-12.5 MG tablet; Take 1 tablet by mouth daily.  Dispense: 90 tablet; Refill: 1  2.  Dyslipidemia associated with type 2 diabetes mellitus (HCC)  - Semaglutide (RYBELSUS) 7 MG TABS; Take 1 tablet by mouth daily with breakfast.  Dispense: 30 tablet; Refill: 1 - POCT HgB A1C  We will monitor for weight loss and may need to stop it   3. Dyslipidemia   4. Hypertension associated with diabetes ([place])  - POCT HgB A1C  5. History of CVA with residual deficit  We will send a letter to work stating not able to return to work  6. Need for vaccination for pneumococcus  - Pneumococcal polysaccharide vaccine 23-valent greater than or equal to 2yo subcutaneous/IM  7. OSA (obstructive sleep apnea)  She is compliant with CPAP

## 2020-03-18 ENCOUNTER — Telehealth: Payer: Self-pay

## 2020-03-18 NOTE — Telephone Encounter (Signed)
Called lmom informing patient of appointment on 06/15/20201. klh

## 2020-03-22 ENCOUNTER — Encounter: Payer: Self-pay | Admitting: Internal Medicine

## 2020-03-22 ENCOUNTER — Ambulatory Visit (INDEPENDENT_AMBULATORY_CARE_PROVIDER_SITE_OTHER): Payer: BC Managed Care – PPO | Admitting: Internal Medicine

## 2020-03-22 ENCOUNTER — Other Ambulatory Visit: Payer: Self-pay

## 2020-03-22 VITALS — BP 130/70 | HR 92 | Temp 97.4°F | Resp 16 | Ht 64.0 in | Wt 201.0 lb

## 2020-03-22 DIAGNOSIS — J301 Allergic rhinitis due to pollen: Secondary | ICD-10-CM

## 2020-03-22 DIAGNOSIS — Z8673 Personal history of transient ischemic attack (TIA), and cerebral infarction without residual deficits: Secondary | ICD-10-CM

## 2020-03-22 DIAGNOSIS — G4733 Obstructive sleep apnea (adult) (pediatric): Secondary | ICD-10-CM

## 2020-03-22 NOTE — Progress Notes (Signed)
[facility] [place], [place] [address]  Pulmonary Sleep Medicine   Office Visit Note  Patient Name: Meagan [name] DOB: Nov 14, 1950 MRN [id number]  Date of Service: 03/22/2020  Complaints/HPI: OSA compliance 100% doing very well with her compliance on the CPAP device.  She continues to show progression her last compliance report as noted was 100%.  She is trying to work on getting weight loss also.  Blood pressures have been under better control 2.  Allergies really without any major issues right now  ROS  General: (-) fever, (-) chills, (-) night sweats, (-) weakness Skin: (-) rashes, (-) itching,. Eyes: (-) visual changes, (-) redness, (-) itching. Nose and Sinuses: (-) nasal stuffiness or itchiness, (-) postnasal drip, (-) nosebleeds, (-) sinus trouble. Mouth and Throat: (-) sore throat, (-) hoarseness. Neck: (-) swollen glands, (-) enlarged thyroid, (-) neck pain. Respiratory: - cough, (-) bloody sputum, + shortness of breath, - wheezing. Cardiovascular: - ankle swelling, (-) chest pain. Lymphatic: (-) lymph node enlargement. Neurologic: (-) numbness, (-) tingling. Psychiatric: (-) anxiety, (-) depression   Current Medication: Outpatient Encounter Medications as of 03/22/2020  Medication Sig Note  . amLODipine (NORVASC) 5 MG tablet TAKE 1 TABLET BY MOUTH EVERY DAY   . atorvastatin (LIPITOR) 80 MG tablet TAKE 1 TABLET (80 MG TOTAL) BY MOUTH DAILY AT 6 PM.   . blood glucose meter kit and supplies Dispense based on patient and insurance preference. Use up to four times daily as directed. (FOR ICD-10 E10.9, E11.9).   Marland Kitchen clopidogrel (PLAVIX) 75 MG tablet TAKE 1 TABLET BY MOUTH EVERY DAY   . glucose blood (ONETOUCH ULTRA) test strip USE UP TO 4 TIMES DAILY AS DIRECTED   . hydrALAZINE (APRESOLINE) 10 MG tablet Take 1 tablet (10 mg total) by mouth 3 (three) times daily as needed. If bp goes above 150/90   . Insulin Detemir (LEVEMIR FLEXTOUCH) 100 UNIT/ML Pen  Inject 25-50 Units into the skin daily. (Patient taking differently: Inject 17 Units into the skin daily after breakfast. ) 10/04/2019: Patient said her MD wants this tapered as follows: decrease by 2 units every three days, beginning on 09/23/2019  . Insulin Pen Needle 32G X 4 MM MISC 1 each by Does not apply route daily.   Marland Kitchen levocetirizine (XYZAL) 5 MG tablet Take 5 mg by mouth daily as needed for allergies.    Marland Kitchen olmesartan-hydrochlorothiazide (BENICAR HCT) 40-12.5 MG tablet Take 1 tablet by mouth daily.   . polyethylene glycol (MIRALAX / GLYCOLAX) 17 g packet Take 17 g by mouth daily.   . Semaglutide (RYBELSUS) 7 MG TABS Take 1 tablet by mouth daily with breakfast.   . senna-docusate (SENOKOT-S) 8.6-50 MG tablet Take 2 tablets by mouth daily as needed for mild constipation or moderate constipation.    No facility-administered encounter medications on file as of 03/22/2020.    Surgical History: Past Surgical History:  Procedure Laterality Date  . APPENDECTOMY  2000s  . BIOPSY THYROID  2006   [facility]  . BREAST EXCISIONAL BIOPSY Left 2000s   neg  . CARPAL TUNNEL RELEASE Left March 2015  . COLONOSCOPY  2011   DR.WOHL  . COLONOSCOPY WITH PROPOFOL N/A 07/11/2017   Procedure: COLONOSCOPY WITH PROPOFOL;  Surgeon: Jonathon Bellows, MD;  Location: [facility] ENDOSCOPY;  Service: Gastroenterology;  Laterality: N/A;  . ganglion cyst removal     . KNEE SURGERY Left 08/2009  . KNEE SURGERY Right 04/07/2010  . ROTATOR CUFF REPAIR Right 2008  .  TUBAL LIGATION      Medical History: Past Medical History:  Diagnosis Date  . Cellulitis    left leg  . 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 Diffuse cystic mastopathy   . Fall 2013  . Hypertension 2004  . Occlusion and stenosis of carotid artery without mention of cerebral infarction   . Sleep apnea   . Stroke [facility])     Family History: Family History  Problem Relation Age of Onset  . Breast cancer Maternal Aunt   .  Hypertension Mother   . Kidney Stones Mother   . Hypertension Father   . Congestive Heart Failure Father   . Hypertension Paternal Grandfather   . Diabetes Sister   . Vision loss Sister   . Varicose Veins Sister   . Hypertension Sister   . Breast cancer Sister 2       Double Mastectomy  . Hypertension Sister     Social History: Social History   Socioeconomic History  . Marital status: Widowed    Spouse name: Not on file  . Number of children: 4  . Years of education: Not on file  . Highest education level: Master's degree (e.g., MA, MS, MEng, MEd, MSW, MBA)  Occupational History  . Occupation: Higher education careers adviser   Tobacco Use  . Smoking status: Never Smoker  . Smokeless tobacco: Never Used  Vaping Use  . Vaping Use: Never used  Substance and Sexual Activity  . Alcohol use: No  . Drug use: No  . Sexual activity: Not Currently    Partners: Male    Comment: Widow for 14 years  Other Topics Concern  . Not on file  Social History Narrative   She had a stroke on 06/2019, she has left peripheral vision loss since stroke   She moved in with her youngest sister    Social Determinants of Health   Financial Resource Strain:   . Difficulty of Paying Living Expenses:   Food Insecurity:   . Worried About Charity fundraiser in the Last Year:   . Arboriculturist in the Last Year:   Transportation Needs:   . Film/video editor (Medical):   Marland Kitchen Lack of Transportation (Non-Medical):   Physical Activity: Inactive  . Days of Exercise per Week: 0 days  . Minutes of Exercise per Session: 0 min  Stress:   . Feeling of Stress :   Social Connections:   . Frequency of Communication with Friends and Family:   . Frequency of Social Gatherings with Friends and Family:   . Attends Religious Services:   . Active Member of Clubs or Organizations:   . Attends Archivist Meetings:   Marland Kitchen Marital Status:   Intimate Partner Violence:   . Fear of Current or Ex-Partner:   .  Emotionally Abused:   Marland Kitchen Physically Abused:   . Sexually Abused:     Vital Signs: Blood pressure 130/70, pulse 92, temperature (!) 97.4 F (36.3 C), resp. rate 16, height '5\' 4"'  (1.626 m), weight 201 lb (91.2 kg), SpO2 96 %.  Examination: General Appearance: The patient is well-developed, well-nourished, and in no distress. Skin: Gross inspection of skin unremarkable. Head: normocephalic, no gross deformities. Eyes: no gross deformities noted. ENT: ears appear grossly normal no exudates. Neck: Supple. No thyromegaly. No LAD. Respiratory: no rhonchi no rales. Cardiovascular: Normal S1 and S2 without murmur or rub. Extremities: No cyanosis. pulses are equal. Neurologic: Alert and oriented.  No involuntary movements.  LABS: Recent Results (from the past 2160 hour(s))  POCT HgB A1C     Status: Abnormal   Collection Time: 03/01/20  3:46 PM  Result Value Ref Range   Hemoglobin A1C 7.0 (A) 4.0 - 5.6 %   HbA1c POC (<> result, manual entry)     HbA1c, POC (prediabetic range)     HbA1c, POC (controlled diabetic range)      Radiology: CT Abdomen Pelvis W Contrast  Result Date: 12/02/2019 CLINICAL DATA:  Constipation. EXAM: CT ABDOMEN AND PELVIS WITH CONTRAST TECHNIQUE: Multidetector CT imaging of the abdomen and pelvis was performed using the standard protocol following bolus administration of intravenous contrast. CONTRAST:  144m OMNIPAQUE IOHEXOL 300 MG/ML  SOLN COMPARISON:  Abdominal ultrasound 11/19/2005 FINDINGS: Lower chest: Minimal atelectasis or scarring in the lung bases. No pleural effusion. Normal heart size. Hepatobiliary: No focal liver abnormality is seen. No gallstones, gallbladder wall thickening, or biliary dilatation. Pancreas: Unremarkable. Spleen: Unremarkable. Adrenals/Urinary Tract: 6 mm fat density mass in the right adrenal gland consistent with a myelolipoma. Unremarkable left adrenal gland. No evidence of renal mass, calculi, or hydronephrosis. Unremarkable bladder.  Stomach/Bowel: The stomach is unremarkable. There is a moderately large amount of stool in the colon and rectum, and there is circumferential rectal wall thickening with perirectal and presacral fat stranding. There is no evidence of bowel obstruction. Prior appendectomy. Vascular/Lymphatic: Aortic atherosclerosis with 3.5 cm aneurysm of the distal aorta at the bifurcation involving the proximal left common iliac artery. No enlarged lymph nodes. Reproductive: 3.2 cm right uterine fundal mass. Unremarkable ovaries. Other: No ascites or pneumoperitoneum. Musculoskeletal: No acute osseous abnormality or suspicious osseous lesion. IMPRESSION: 1. Moderately large amount of stool in the colon and rectum consistent with constipation. No evidence of bowel obstruction. 2. Rectal inflammation compatible with proctitis. 3. 3.5 cm distal abdominal aortic aneurysm. Recommend followup by ultrasound in 2 years. This recommendation follows ACR consensus guidelines: White Paper of the ACR Incidental Findings Committee II on Vascular Findings. J Am Coll Radiol 2013; 10:789-794. Aortic aneurysm NOS (ICD10-I71.9) 4. Uterine fibroid. 5. Small right adrenal myelolipoma. 6. Aortic Atherosclerosis (ICD10-I70.0). Electronically Signed   By: ALogan BoresM.D.   On: 12/02/2019 20:07    No results found.  No results found.    Assessment and Plan: Patient Active Problem List   Diagnosis Date Noted  . History of CVA with residual deficit 07/01/2019  . Leukocytosis 07/01/2019  . Polycythemia 07/01/2019  . Morbid obesity ([place] 10/22/2018  . Non compliance with medical treatment 03/26/2018  . Dyslipidemia associated with type 2 diabetes mellitus ([place] 12/04/2016  . Uncontrolled type 2 diabetes mellitus with microalbuminuria, without long-term current use of insulin ([place] 12/04/2016  . Seasonal allergies 02/01/2016  . OSA (obstructive sleep apnea) 10/06/2015  . Hypertensive retinopathy 10/06/2015  . Thyroid cyst 10/06/2015  .  Dyslipidemia 10/06/2015  . Type 2 diabetes, uncontrolled, with neuropathy ([place] 10/06/2015  . History of pneumonia 10/06/2015  . Chronic neck pain 10/06/2015  . Carotid stenosis   . Hypertension, benign     1. OSA she is on CPAP adequately controlled and 100% compliance continue with the present setting 2. H/o Stroke no further issues with CVA she is getting PT OT 3. Morbid obesity work on diet and exercise 4. Seasonal Allergies nasal inhalers as tolerated  General Counseling: I have discussed the findings of the evaluation and examination with Jazyiah.  I have also discussed any further diagnostic evaluation thatmay be needed or ordered today.  Selena verbalizes understanding of the findings of todays visit. We also reviewed her medications today and discussed drug interactions and side effects including but not limited excessive drowsiness and altered mental states. We also discussed that there is always a risk not just to her but also people around her. she has been encouraged to call the office with any questions or concerns that should arise related to todays visit.  No orders of the defined types were placed in this encounter.    Time spent: 62mn  I have personally obtained a history, examined the patient, evaluated laboratory and imaging results, formulated the assessment and plan and placed orders.    SAllyne Gee MD FNovamed Surgery Center Of Cleveland LLCPulmonary and Critical Care Sleep medicine

## 2020-03-22 NOTE — Patient Instructions (Signed)

## 2020-03-23 ENCOUNTER — Ambulatory Visit
Admission: RE | Admit: 2020-03-23 | Discharge: 2020-03-23 | Disposition: A | Discharge status: Home / Self Care | Payer: BC Managed Care – PPO | Source: Ambulatory Visit | Attending: Family Medicine | Admitting: Family Medicine

## 2020-03-23 DIAGNOSIS — Z1231 Encounter for screening mammogram for malignant neoplasm of breast: Secondary | ICD-10-CM | POA: Diagnosis present

## 2020-03-23 DIAGNOSIS — E2839 Other primary ovarian failure: Secondary | ICD-10-CM | POA: Diagnosis present

## 2020-03-28 ENCOUNTER — Ambulatory Visit (INDEPENDENT_AMBULATORY_CARE_PROVIDER_SITE_OTHER): Payer: BC Managed Care – PPO | Admitting: Vascular Surgery

## 2020-03-28 ENCOUNTER — Ambulatory Visit (INDEPENDENT_AMBULATORY_CARE_PROVIDER_SITE_OTHER): Payer: BC Managed Care – PPO

## 2020-03-28 ENCOUNTER — Other Ambulatory Visit: Payer: Self-pay

## 2020-03-28 ENCOUNTER — Encounter (INDEPENDENT_AMBULATORY_CARE_PROVIDER_SITE_OTHER): Payer: Self-pay | Admitting: Vascular Surgery

## 2020-03-28 ENCOUNTER — Encounter (INDEPENDENT_AMBULATORY_CARE_PROVIDER_SITE_OTHER): Payer: BC Managed Care – PPO

## 2020-03-28 VITALS — BP 132/81 | HR 89 | Resp 16 | Ht 64.0 in | Wt 201.0 lb

## 2020-03-28 DIAGNOSIS — IMO0002 Reserved for concepts with insufficient information to code with codable children: Secondary | ICD-10-CM

## 2020-03-28 DIAGNOSIS — E785 Hyperlipidemia, unspecified: Secondary | ICD-10-CM | POA: Diagnosis not present

## 2020-03-28 DIAGNOSIS — I6523 Occlusion and stenosis of bilateral carotid arteries: Secondary | ICD-10-CM | POA: Diagnosis not present

## 2020-03-28 DIAGNOSIS — E1165 Type 2 diabetes mellitus with hyperglycemia: Secondary | ICD-10-CM

## 2020-03-28 DIAGNOSIS — I10 Essential (primary) hypertension: Secondary | ICD-10-CM

## 2020-03-28 DIAGNOSIS — E1140 Type 2 diabetes mellitus with diabetic neuropathy, unspecified: Secondary | ICD-10-CM | POA: Diagnosis not present

## 2020-03-28 NOTE — Progress Notes (Signed)
MRN : [id number]  Meagan [name] is a 69 y.o. (08-22-1951) female who presents with chief complaint of  Chief Complaint  Patient presents with  . Follow-up    ultrasound follow up  .  History of Present Illness:  The patient is seen for evaluation of carotid stenosis. The carotid stenosis was identified after she experienced a stroke. The CVA was posterior. Nevertheless, work-up included a CT angiogram which has demonstrated less than 50% bilateral internal carotid artery stenosis. There is a 60% stenosis of the right external carotid artery. At the time of her CVA the patient was in a hypertensive crisis and complaining of an occipital headache  The patient denies amaurosis fugax. There is no recent history of TIA symptoms or focal motor deficits. There is no prior documented CVA.  She denies further severe headaches since her stroke. There is no history of seizures.  The patient is taking enteric-coated aspirin 81 mg daily.  The patient has a history of coronary artery disease, no recent episodes of angina or shortness of breath. The patient denies PAD or claudication symptoms. There is a history of hyperlipidemia which is being treated with a statin.   Duplex Ultrasound of the RICA 91-91% and the LICA <66%  Current Meds  Medication Sig  . amLODipine (NORVASC) 5 MG tablet TAKE 1 TABLET BY MOUTH EVERY DAY  . atorvastatin (LIPITOR) 80 MG tablet TAKE 1 TABLET (80 MG TOTAL) BY MOUTH DAILY AT 6 PM.  . blood glucose meter kit and supplies Dispense based on patient and insurance preference. Use up to four times daily as directed. (FOR ICD-10 E10.9, E11.9).  Marland Kitchen clopidogrel (PLAVIX) 75 MG tablet TAKE 1 TABLET BY MOUTH EVERY DAY  . glucose blood (ONETOUCH ULTRA) test strip USE UP TO 4 TIMES DAILY AS DIRECTED  . hydrALAZINE (APRESOLINE) 10 MG tablet Take 1 tablet (10 mg total) by mouth 3 (three) times daily as needed. If bp goes above 150/90  . Insulin Detemir (LEVEMIR FLEXTOUCH) 100  UNIT/ML Pen Inject 25-50 Units into the skin daily. (Patient taking differently: Inject 17 Units into the skin daily after breakfast. )  . Insulin Pen Needle 32G X 4 MM MISC 1 each by Does not apply route daily.  Marland Kitchen levocetirizine (XYZAL) 5 MG tablet Take 5 mg by mouth daily as needed for allergies.   Marland Kitchen olmesartan-hydrochlorothiazide (BENICAR HCT) 40-12.5 MG tablet Take 1 tablet by mouth daily.  . polyethylene glycol (MIRALAX / GLYCOLAX) 17 g packet Take 17 g by mouth daily.  . Semaglutide (RYBELSUS) 7 MG TABS Take 1 tablet by mouth daily with breakfast.  . senna-docusate (SENOKOT-S) 8.6-50 MG tablet Take 2 tablets by mouth daily as needed for mild constipation or moderate constipation.    Past Medical History:  Diagnosis Date  . Cellulitis    left leg  . 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 Diffuse cystic mastopathy   . Fall 2013  . Hypertension 2004  . Occlusion and stenosis of carotid artery without mention of cerebral infarction   . Sleep apnea   . Stroke [facility])     Past Surgical History:  Procedure Laterality Date  . APPENDECTOMY  2000s  . BIOPSY THYROID  2006   [facility]  . BREAST EXCISIONAL BIOPSY Left 2000s   neg  . CARPAL TUNNEL RELEASE Left March 2015  . COLONOSCOPY  2011   DR.WOHL  . COLONOSCOPY WITH PROPOFOL N/A 07/11/2017   Procedure: COLONOSCOPY WITH PROPOFOL;  Surgeon: Jonathon Bellows, MD;  Location: [facility] ENDOSCOPY;  Service: Gastroenterology;  Laterality: N/A;  . ganglion cyst removal     . KNEE SURGERY Left 08/2009  . KNEE SURGERY Right 04/07/2010  . ROTATOR CUFF REPAIR Right 2008  . TUBAL LIGATION      Social History Social History   Tobacco Use  . Smoking status: Never Smoker  . Smokeless tobacco: Never Used  Vaping Use  . Vaping Use: Never used  Substance Use Topics  . Alcohol use: No  . Drug use: No    Family History Family History  Problem Relation Age of Onset  . Breast cancer Maternal Aunt   . Hypertension  Mother   . Kidney Stones Mother   . Hypertension Father   . Congestive Heart Failure Father   . Hypertension Paternal Grandfather   . Diabetes Sister   . Vision loss Sister   . Varicose Veins Sister   . Hypertension Sister   . Breast cancer Sister 43       Double Mastectomy  . Hypertension Sister     Allergies  Allergen Reactions  . Bee Venom Swelling and Other (See Comments)    Immediately takes Benadryl to counteract  . Wilder Glade [Dapagliflozin] Rash    Pt reports that she developed a rash when she took [place] and she would like it to be added as an allergy  . Keflex [Cephalexin] Rash     REVIEW OF SYSTEMS (Negative unless checked)  Constitutional: _0 Weight loss  _1 Fever  _2 Chills Cardiac: _3 Chest pain   _4 Chest pressure   _5 Palpitations   _6 Shortness of breath when laying flat   _7 Shortness of breath with exertion. Vascular:  _8 Pain in legs with walking   _9 Pain in legs at rest  _10 History of DVT   _11 Phlebitis   _12 Swelling in legs   _13 Varicose veins   _14 Non-healing ulcers Pulmonary:   _15 Uses home oxygen   _16 Productive cough   _17 Hemoptysis   _18 Wheeze  _19 COPD   _20 Asthma Neurologic:  _21 Dizziness   _22 Seizures   _23 History of stroke   _24 History of TIA  _25 Aphasia   _26 Vissual changes   _27 Weakness or numbness in arm   _28 Weakness or numbness in leg Musculoskeletal:   _29 Joint swelling   _30 Joint pain   _31 Low back pain Hematologic:  _32 Easy bruising  _33 Easy bleeding   _34 Hypercoagulable state   _35 Anemic Gastrointestinal:  _36 Diarrhea   _37 Vomiting  _38 Gastroesophageal reflux/heartburn   _39 Difficulty swallowing. Genitourinary:  _40 Chronic kidney disease   _41 Difficult urination  _42 Frequent urination   _43 Blood in urine Skin:  _44 Rashes   _45 Ulcers  Psychological:  _46 History of anxiety   _47  History of major depression.  Physical Examination  Vitals:   03/28/20 1354 03/28/20 1356  BP: 138/84 132/81  Pulse: 89   Resp: 16   Height: _48  (1.626 m)    Body mass index is 34.5 kg/m. Gen: WD/WN,  NAD Head: [place]/AT, No temporalis wasting.  Ear/Nose/Throat: Hearing grossly intact, nares w/o erythema or drainage Eyes: PER, EOMI, sclera nonicteric.  Neck: Supple, no large masses.   Pulmonary:  Good air movement, no audible wheezing bilaterally, no use of accessory muscles.  Cardiac: RRR, no JVD Vascular:  Vessel Right Left  Radial Palpable Palpable  Carotid Palpable Palpable  Gastrointestinal: Non-distended. No guarding/no peritoneal signs.  Musculoskeletal: M/S 5/5 throughout.  No deformity or atrophy.  Neurologic: CN 2-12 intact. Symmetrical.  Speech is fluent. Motor exam as listed above. Psychiatric: Judgment intact, Mood & affect appropriate for pt's clinical situation. Dermatologic:  No rashes or ulcers noted.  No changes consistent with cellulitis.   CBC Lab Results  Component Value Date   WBC 12.8 (H) 12/02/2019   HGB 11.8 (L) 12/02/2019   HCT 34.3 (L) 12/02/2019   MCV 90.5 12/02/2019   PLT 367 12/02/2019    BMET    Component Value Date/Time   NA 136 12/02/2019 1738   NA 138 01/31/2016 1115   K 3.8 12/02/2019 1738   CL 98 12/02/2019 1738   CO2 29 12/02/2019 1738   GLUCOSE 119 (H) 12/02/2019 1738   BUN 22 12/02/2019 1738   BUN 10 01/31/2016 1115   CREATININE 0.94 12/02/2019 1738   CREATININE 0.91 10/08/2019 1430   CALCIUM 9.5 12/02/2019 1738   GFRNONAA >60 12/02/2019 1738   GFRNONAA 66 10/08/2019 1430   GFRAA >60 12/02/2019 1738   GFRAA 76 10/08/2019 1430   CrCl cannot be calculated (Patient's most recent lab result is older than the maximum 21 days allowed.).  COAG Lab Results  Component Value Date   INR 1.1 04/21/2009    Radiology DG Bone Density  Result Date: 03/23/2020 EXAM: DUAL X-RAY ABSORPTIOMETRY (DXA) FOR BONE MINERAL DENSITY IMPRESSION: Your patient Meagan [name] completed a BMD test on 03/23/2020 using the Anderson (software version: 14.10) manufactured by [employer]. The following summarizes the results of our  evaluation. Technologist:MTB PATIENT BIOGRAPHICAL: Name: Meagan, [name] Patient ID: [id number] Birth Date: 1951/02/13 Height: 64.0 in. Gender: Female Exam Date: 03/23/2020 Weight: 201.0 lbs. Indications: Diabetic, Postmenopausal Fractures: Treatments: DENSITOMETRY RESULTS: Site         Region     Measured Date Measured Age WHO Classification Young Adult T-score BMD         %Change vs. Previous Significant Change (*) AP Spine L1-L4 03/23/2020 66.8 Normal -0.4 1.150 g/cm2 - - DualFemur Neck Right 03/23/2020 66.8 Normal -0.9 0.914 g/cm2 - - DualFemur Total Mean 03/23/2020 66.8 Normal -0.5 0.946 g/cm2 - - Left Forearm Radius 33% 03/23/2020 66.8 Normal 0.7 0.937 g/cm2 - - ASSESSMENT: The BMD measured at Femur Neck Right is 0.914 g/cm2 with a T-score of -0.9. This patient is considered normal according to [facility]) criteria. The scan quality is good. World Pharmacologist [facility]) criteria for post-menopausal, Caucasian Women: Normal:                   T-score at or above -1 SD Osteopenia/low bone mass: T-score between -1 and -2.5 SD Osteoporosis:             T-score at or below -2.5 SD RECOMMENDATIONS: 1. All patients should optimize calcium and vitamin D intake. 2. Consider FDA-approved medical therapies in postmenopausal women and men aged 68 years and older, based on the following: a. A hip or vertebral(clinical or morphometric) fracture b. T-score < -2.5 at the femoral neck or spine after appropriate evaluation to exclude secondary causes c. Low bone mass (T-score between -1.0 and -2.5 at the femoral neck or spine) and a 10-year probability of a hip fracture > 3% or a 10-year probability of a major osteoporosis-related fracture > 20% based on the [place]-adapted WHO algorithm 3. Clinician judgment and/or patient preferences may indicate treatment for people with 10-year fracture probabilities above or below these levels FOLLOW-UP: People with diagnosed cases of osteoporosis or at high risk for  fracture should have regular bone mineral density tests. For patients eligible for Medicare, routine testing is allowed once every 2 years. The testing frequency can  be increased to one year for patients who have rapidly progressing disease, those who are receiving or discontinuing medical therapy to restore bone mass, or have additional risk factors. I have reviewed this report, and agree with the above findings. [facility] Electronically Signed   By: Rolm Baptise M.D.   On: 03/23/2020 19:58   MM 3D SCREEN BREAST BILATERAL  Result Date: 03/25/2020 CLINICAL DATA:  Screening. EXAM: DIGITAL SCREENING BILATERAL MAMMOGRAM WITH TOMO AND CAD COMPARISON:  Previous exam(s). ACR Breast Density Category c: The breast tissue is heterogeneously dense, which may obscure small masses. FINDINGS: There are no findings suspicious for malignancy. Images were processed with CAD. IMPRESSION: No mammographic evidence of malignancy. A result letter of this screening mammogram will be mailed directly to the patient. RECOMMENDATION: Screening mammogram in one year. (Code:SM-B-01Y) BI-RADS CATEGORY  1: Negative. Electronically Signed   By: Ammie Ferrier M.D.   On: 03/25/2020 09:10     Assessment/Plan 1. Bilateral carotid artery stenosis Recommend:  Given the patient's asymptomatic subcritical stenosis no further invasive testing or surgery at this time.  Duplex ultrasound shows <50% ICA stenosis bilaterally.  Continue antiplatelet therapy as prescribed Continue management of CAD, HTN and Hyperlipidemia Healthy heart diet,  encouraged exercise at least 4 times per week Follow up in 6 months with duplex ultrasound and physical exam   - VAS US CAROTID; Future  2. Hypertension, benign Continue antihypertensive medications as already ordered, these medications have been reviewed and there are no changes at this time.   3. Type 2 diabetes, uncontrolled, with neuropathy ([place]) Continue hypoglycemic  medications as already ordered, these medications have been reviewed and there are no changes at this time.  Hgb A1C to be monitored as already arranged by primary service   4. Dyslipidemia Continue statin as ordered and reviewed, no changes at this time     Hortencia Pilar, MD  03/28/2020 1:59 PM

## 2020-04-11 ENCOUNTER — Encounter (INDEPENDENT_AMBULATORY_CARE_PROVIDER_SITE_OTHER): Payer: Self-pay | Admitting: Vascular Surgery

## 2020-04-25 ENCOUNTER — Telehealth: Payer: Self-pay | Admitting: *Deleted

## 2020-04-25 ENCOUNTER — Telehealth: Payer: Self-pay

## 2020-04-25 NOTE — Telephone Encounter (Signed)
Becky from [facility] called to discuss Medical records for this pt. Please call [phone] ext: [phone].

## 2020-04-25 NOTE — Telephone Encounter (Signed)
Request from [facility] was faxed on 04/21/20

## 2020-04-28 DIAGNOSIS — Z0289 Encounter for other administrative examinations: Secondary | ICD-10-CM

## 2020-05-03 NOTE — Telephone Encounter (Signed)
Received from MR, Attending Physicians form to complete.  Started and to JM/NP to complete/ sign.

## 2020-05-04 NOTE — Telephone Encounter (Signed)
Completed and placed in out box.  Thank you.

## 2020-05-05 NOTE — Telephone Encounter (Signed)
To MR. 

## 2020-05-06 ENCOUNTER — Other Ambulatory Visit: Payer: Self-pay | Admitting: Family Medicine

## 2020-05-06 DIAGNOSIS — E1169 Type 2 diabetes mellitus with other specified complication: Secondary | ICD-10-CM

## 2020-05-25 ENCOUNTER — Other Ambulatory Visit: Payer: Self-pay | Admitting: Family Medicine

## 2020-06-30 LAB — HM DIABETES EYE EXAM

## 2020-06-30 NOTE — Progress Notes (Signed)
Name: Meagan [name]   MRN: [id number]    DOB: 12/03/1950   Date:07/01/2020       Progress Note  Subjective  Chief Complaint  Chief Complaint  Patient presents with  . Diabetes  . Dyslipidemia  . Hypertension  . Shoulder Pain    Left shoulder pain x 2 months. Limited ROM, sore to touch. 6/10 pain with movement and touching. She heard something pop one night while she was laying down.    HPI  DMII: she states since she stopped Ozempic she has been able to eat again, her weigh is going up , however her glucose has been in the 170's and sometimes 190's , stay on 20 units of Levemir she would like to control her appetite and asked to resume Ozempic, explained that I am afraid of doing so since it caused significant lack of appetite, we switch to Rybelsus on her last visit, but taking 7 mg and states not curbing her appetite and her diet and is not good anymore. Also snacking before bed time. She is willing to try going up on the dose and resume a diabetic diet   S/p [id number] of CVA was 07/01/2019, admitted from 07/01/2019 until 07/08/2019 and had inpatient rehab until 07/17/2019. She developed right occipital headaches and bp was spiking . She went to eye doctor and bp was over 200's , he called me and we advised to send her to [facility], she arrived to [facility] with her daughter. CTA showed 45 % stenosis proximal right internal carotid artery , 60 % stenosis right external carotid artery , left distal common external carotid was 30 % , echo showed normal EF, LDL was very high at 199 , she is now on Atorvastatin and  Plavix 75 mg . She initially lost vision bilaterally and has some dysarthria, but visionis unchanged since last visit with me, she does not haveperipheral vision onleft side, still seeing retina sub-specialist and is still unable to drive, using a walker , speech is much better now . She retired   HTN: bp at home has been mostlyin the 140's range in am's, sister is now only giving her  hydralazine prn again, and we will go back to Benicar 40/25 and norvasc 5 mg   OSA: seen by Dr. Humphrey Rolls and had sleep study, wearing cpap every night, tolerating it well Unchanged .   Morbid obesity: she has a BMI above 35 and multiple co-morbidities, discussed importance of losing weight  Left shoulder pain: she sleeps on her left side , she heard a pop and pain when she started to lift left shoulder, going on for months, no redness or swelling.   Patient Active Problem List   Diagnosis Date Noted  . History of CVA with residual deficit 07/01/2019  . Leukocytosis 07/01/2019  . Polycythemia 07/01/2019  . Morbid obesity ([place]) 10/22/2018  . Non compliance with medical treatment 03/26/2018  . Dyslipidemia associated with type 2 diabetes mellitus ([place]) 12/04/2016  . Uncontrolled type 2 diabetes mellitus with microalbuminuria, without long-term current use of insulin ([place]) 12/04/2016  . Seasonal allergies 02/01/2016  . OSA (obstructive sleep apnea) 10/06/2015  . Hypertensive retinopathy 10/06/2015  . Thyroid cyst 10/06/2015  . Dyslipidemia 10/06/2015  . Type 2 diabetes, uncontrolled, with neuropathy ([place]) 10/06/2015  . History of pneumonia 10/06/2015  . Chronic neck pain 10/06/2015  . Carotid stenosis   . Hypertension, benign     Past Surgical History:  Procedure Laterality Date  . APPENDECTOMY  2000s  .  BIOPSY THYROID  2006   [facility]  . BREAST EXCISIONAL BIOPSY Left 2000s   neg  . CARPAL TUNNEL RELEASE Left March 2015  . COLONOSCOPY  2011   DR.WOHL  . COLONOSCOPY WITH PROPOFOL N/A 07/11/2017   Procedure: COLONOSCOPY WITH PROPOFOL;  Surgeon: Jonathon Bellows, MD;  Location: [facility] ENDOSCOPY;  Service: Gastroenterology;  Laterality: N/A;  . ganglion cyst removal     . KNEE SURGERY Left 08/2009  . KNEE SURGERY Right 04/07/2010  . ROTATOR CUFF REPAIR Right 2008  . TUBAL LIGATION      Family History  Problem Relation Age of Onset  . Breast cancer Maternal Aunt   . Hypertension Mother   .  Kidney Stones Mother   . Hypertension Father   . Congestive Heart Failure Father   . Hypertension Paternal Grandfather   . Diabetes Sister   . Vision loss Sister   . Varicose Veins Sister   . Hypertension Sister   . Breast cancer Sister 57       Double Mastectomy  . Hypertension Sister     Social History   Tobacco Use  . Smoking status: Never Smoker  . Smokeless tobacco: Never Used  Substance Use Topics  . Alcohol use: No     Current Outpatient Medications:  .  amLODipine (NORVASC) 5 MG tablet, TAKE 1 TABLET BY MOUTH EVERY DAY, Disp: 90 tablet, Rfl: 1 .  atorvastatin (LIPITOR) 80 MG tablet, TAKE 1 TABLET (80 MG TOTAL) BY MOUTH DAILY AT 6 PM., Disp: 90 tablet, Rfl: 3 .  blood glucose meter kit and supplies, Dispense based on patient and insurance preference. Use up to four times daily as directed. (FOR ICD-10 E10.9, E11.9)., Disp: 1 each, Rfl: 0 .  clopidogrel (PLAVIX) 75 MG tablet, TAKE 1 TABLET BY MOUTH EVERY DAY, Disp: 90 tablet, Rfl: 1 .  glucose blood (ONETOUCH ULTRA) test strip, USE UP TO 4 TIMES DAILY AS DIRECTED, Disp: 200 strip, Rfl: 2 .  hydrALAZINE (APRESOLINE) 10 MG tablet, Take 1 tablet (10 mg total) by mouth 3 (three) times daily as needed. If bp goes above 150/90, Disp: 30 tablet, Rfl: 0 .  Insulin Detemir (LEVEMIR FLEXTOUCH) 100 UNIT/ML Pen, Inject 25-50 Units into the skin daily. (Patient taking differently: Inject 17 Units into the skin daily after breakfast. ), Disp: 15 mL, Rfl: 2 .  Insulin Pen Needle 32G X 4 MM MISC, 1 each by Does not apply route daily., Disp: 100 each, Rfl: 11 .  levocetirizine (XYZAL) 5 MG tablet, Take 5 mg by mouth daily as needed for allergies. , Disp: , Rfl:  .  olmesartan-hydrochlorothiazide (BENICAR HCT) 40-12.5 MG tablet, Take 1 tablet by mouth daily., Disp: 90 tablet, Rfl: 1 .  polyethylene glycol (MIRALAX / GLYCOLAX) 17 g packet, Take 17 g by mouth daily., Disp: 14 each, Rfl: 0 .  RYBELSUS 7 MG TABS, TAKE 1 TABLET BY MOUTH EVERY DAY  WITH BREAKFAST, Disp: 30 tablet, Rfl: 1 .  senna-docusate (SENOKOT-S) 8.6-50 MG tablet, Take 2 tablets by mouth daily as needed for mild constipation or moderate constipation., Disp: 30 tablet, Rfl: 1  Allergies  Allergen Reactions  . Bee Venom Swelling and Other (See Comments)    Immediately takes Benadryl to counteract  . Wilder Glade [Dapagliflozin] Rash    Pt reports that she developed a rash when she took [place] and she would like it to be added as an allergy  . Keflex [Cephalexin] Rash    I personally reviewed active problem list, medication  list, allergies, family history, social history, health maintenance with the patient/caregiver today.   ROS  Constitutional: Negative for fever , positive for weight change.  Respiratory: Negative for cough and shortness of breath.   Cardiovascular: Negative for chest pain or palpitations.  Gastrointestinal: Negative for abdominal pain, no bowel changes.  Musculoskeletal: positive for  for gait problem but no  joint swelling.  Skin: Negative for rash.  Neurological: Negative for dizziness or headache.  No other specific complaints in a complete review of systems (except as listed in HPI above).  Objective  Vitals:   07/01/20 1445  BP: (!) 160/90  Pulse: 99  Resp: 16  Temp: 98.1 F (36.7 C)  TempSrc: Oral  SpO2: 99%  Weight: 205 lb 12.8 oz (93.4 kg)  Height: 5' 4" (1.626 m)    Body mass index is 35.33 kg/m.  Physical Exam  Constitutional: Patient appears well-developed and well-nourished. Obese No distress.  HEENT: head atraumatic, normocephalic, pupils equal and reactive to light, neck supple Cardiovascular: Normal rate, regular rhythm and normal heart sounds.  No murmur heard. No BLE edema. Pulmonary/Chest: Effort normal and breath sounds normal. No respiratory distress. Abdominal: Soft.  There is no tenderness. Psychiatric: Patient has a normal mood and affect. behavior is normal. Judgment and thought content  normal. Muscular skeletal: uses a cane to improve with gait, decrease rom of left shoulder  Recent Results (from the past 2160 hour(s))  POCT HgB A1C     Status: Abnormal   Collection Time: 07/01/20  2:56 PM  Result Value Ref Range   Hemoglobin A1C 7.5 (A) 4.0 - 5.6 %   HbA1c POC (<> result, manual entry)     HbA1c, POC (prediabetic range)     HbA1c, POC (controlled diabetic range)       PHQ2/9: Depression screen [facility] 2/9 07/01/2020 03/01/2020 01/15/2020 12/02/2019 11/04/2019  Decreased Interest 0 0 0 0 0  Down, Depressed, Hopeless 0 0 0 0 0  PHQ - 2 Score 0 0 0 0 0  Altered sleeping - 0 0 0 0  Tired, decreased energy - 0 0 0 0  Change in appetite - 0 0 0 3  Feeling bad or failure about yourself  - 0 0 0 0  Trouble concentrating - 0 0 0 0  Moving slowly or fidgety/restless - 0 0 0 0  Suicidal thoughts - 0 0 0 0  PHQ-9 Score - 0 0 0 3  Difficult doing work/chores - Not difficult at all - - Not difficult at all  Some recent data might be hidden    phq 9 is negative   Fall Risk: Fall Risk  07/01/2020 03/01/2020 01/15/2020 12/02/2019 10/07/2019  Falls in the past year? 0 0 0 0 0  Number falls in past yr: 0 0 0 0 0  Injury with Fall? 0 0 0 0 0  Follow up - Falls evaluation completed - - Falls evaluation completed     Functional Status Survey: Is the patient deaf or have difficulty hearing?: No Does the patient have difficulty seeing, even when wearing glasses/contacts?: Yes Does the patient have difficulty concentrating, remembering, or making decisions?: No Does the patient have difficulty walking or climbing stairs?: No Does the patient have difficulty dressing or bathing?: No Does the patient have difficulty doing errands alone such as visiting a doctor's office or shopping?: Yes    Assessment & Plan  1. Uncontrolled type 2 diabetes mellitus with microalbuminuria, without long-term current use of insulin ([place])  She will resume a diabetic diet, we will adjust dose of Rybelsus   - POCT HgB A1C - Semaglutide (RYBELSUS) 14 MG TABS; Take 1 tablet by mouth daily. Double your current dose until out before filling this rx  Dispense: 90 tablet; Refill: 0  2. Dyslipidemia associated with type 2 diabetes mellitus ([place])   3. Hypertension, uncontrolled  Resume previous regiment, she gained weight , needs to resume a low salt diet   4. Dyslipidemia   5. Hypertension associated with diabetes ([place])  On ARB  6. OSA (obstructive sleep apnea)   7.. History of CVA with residual deficit  Improving, still uses a cane   8. Morbid obesity ([place])  Discussed with the patient the risk posed by an increased BMI. Discussed importance of portion control, calorie counting and at least 150 minutes of physical activity weekly. Avoid sweet beverages and drink more water. Eat at least 6 servings of fruit and vegetables daily   9. Intermittent constipation  - docusate sodium (COLACE) 100 MG capsule; Take 1 capsule (100 mg total) by mouth daily as needed for mild constipation.  Dispense: 60 capsule; Refill: 0  10. Need for immunization against influenza  - Flu Vaccine QUAD High Dose(Fluad)  11. Chronic left shoulder pain  - Ambulatory referral to Physical Therapy

## 2020-07-01 ENCOUNTER — Encounter: Payer: Self-pay | Admitting: Family Medicine

## 2020-07-01 ENCOUNTER — Other Ambulatory Visit: Payer: Self-pay

## 2020-07-01 ENCOUNTER — Ambulatory Visit (INDEPENDENT_AMBULATORY_CARE_PROVIDER_SITE_OTHER): Payer: BC Managed Care – PPO | Admitting: Family Medicine

## 2020-07-01 VITALS — BP 148/82 | HR 99 | Temp 98.1°F | Resp 16 | Ht 64.0 in | Wt 205.8 lb

## 2020-07-01 DIAGNOSIS — E1129 Type 2 diabetes mellitus with other diabetic kidney complication: Secondary | ICD-10-CM

## 2020-07-01 DIAGNOSIS — Z23 Encounter for immunization: Secondary | ICD-10-CM | POA: Diagnosis not present

## 2020-07-01 DIAGNOSIS — E1169 Type 2 diabetes mellitus with other specified complication: Secondary | ICD-10-CM | POA: Diagnosis not present

## 2020-07-01 DIAGNOSIS — E1159 Type 2 diabetes mellitus with other circulatory complications: Secondary | ICD-10-CM

## 2020-07-01 DIAGNOSIS — E785 Hyperlipidemia, unspecified: Secondary | ICD-10-CM | POA: Diagnosis not present

## 2020-07-01 DIAGNOSIS — R809 Proteinuria, unspecified: Secondary | ICD-10-CM

## 2020-07-01 DIAGNOSIS — E1165 Type 2 diabetes mellitus with hyperglycemia: Secondary | ICD-10-CM | POA: Diagnosis not present

## 2020-07-01 DIAGNOSIS — M25512 Pain in left shoulder: Secondary | ICD-10-CM

## 2020-07-01 DIAGNOSIS — G4733 Obstructive sleep apnea (adult) (pediatric): Secondary | ICD-10-CM

## 2020-07-01 DIAGNOSIS — I6523 Occlusion and stenosis of bilateral carotid arteries: Secondary | ICD-10-CM

## 2020-07-01 DIAGNOSIS — I10 Essential (primary) hypertension: Secondary | ICD-10-CM

## 2020-07-01 DIAGNOSIS — K5909 Other constipation: Secondary | ICD-10-CM

## 2020-07-01 DIAGNOSIS — I6930 Unspecified sequelae of cerebral infarction: Secondary | ICD-10-CM

## 2020-07-01 DIAGNOSIS — IMO0002 Reserved for concepts with insufficient information to code with codable children: Secondary | ICD-10-CM

## 2020-07-01 DIAGNOSIS — G8929 Other chronic pain: Secondary | ICD-10-CM

## 2020-07-01 LAB — POCT GLYCOSYLATED HEMOGLOBIN (HGB A1C): Hemoglobin A1C: 7.5 % — AB (ref 4.0–5.6)

## 2020-07-01 MED ORDER — RYBELSUS 14 MG PO TABS: 1.0000 | ORAL_TABLET | Freq: Every day | ORAL | 0 refills | Status: DC

## 2020-07-01 MED ORDER — OLMESARTAN MEDOXOMIL-HCTZ 40-25 MG PO TABS: 1.0000 | ORAL_TABLET | Freq: Every day | ORAL | 1 refills | Status: DC

## 2020-07-01 MED ORDER — HYDROCHLOROTHIAZIDE 12.5 MG PO CAPS: 12.5000 mg | ORAL_CAPSULE | Freq: Every day | ORAL | 1 refills | Status: DC

## 2020-07-01 MED ORDER — DOCUSATE SODIUM 100 MG PO CAPS: 100.0000 mg | ORAL_CAPSULE | Freq: Every day | ORAL | 0 refills | Status: DC | PRN

## 2020-07-16 ENCOUNTER — Other Ambulatory Visit: Payer: Self-pay | Admitting: Family Medicine

## 2020-07-16 DIAGNOSIS — E1169 Type 2 diabetes mellitus with other specified complication: Secondary | ICD-10-CM

## 2020-07-21 ENCOUNTER — Other Ambulatory Visit: Payer: Self-pay | Admitting: Family Medicine

## 2020-07-21 DIAGNOSIS — I6930 Unspecified sequelae of cerebral infarction: Secondary | ICD-10-CM

## 2020-07-25 ENCOUNTER — Telehealth: Payer: Self-pay

## 2020-07-25 NOTE — Telephone Encounter (Signed)
Confirmed and screened for 07-27-20 ov.

## 2020-07-27 ENCOUNTER — Ambulatory Visit (INDEPENDENT_AMBULATORY_CARE_PROVIDER_SITE_OTHER): Payer: Medicare Other

## 2020-07-27 ENCOUNTER — Other Ambulatory Visit: Payer: Self-pay

## 2020-07-27 DIAGNOSIS — G4733 Obstructive sleep apnea (adult) (pediatric): Secondary | ICD-10-CM

## 2020-07-27 NOTE — Progress Notes (Signed)
95 percentile pressure 13.3   95th percentile leak 26.8   apnea index 0.8 /hr  apnea-hypopnea index  1.3 /hr   total days used  >4 hr 167 days  total days used <4 hr 6 days  Total compliance 96 percent

## 2020-08-09 ENCOUNTER — Other Ambulatory Visit: Payer: Self-pay | Admitting: Family Medicine

## 2020-08-10 ENCOUNTER — Other Ambulatory Visit: Payer: Self-pay | Admitting: Family Medicine

## 2020-08-10 DIAGNOSIS — I10 Essential (primary) hypertension: Secondary | ICD-10-CM

## 2020-08-11 ENCOUNTER — Other Ambulatory Visit: Payer: Self-pay | Admitting: Family Medicine

## 2020-08-11 DIAGNOSIS — I10 Essential (primary) hypertension: Secondary | ICD-10-CM

## 2020-08-15 ENCOUNTER — Other Ambulatory Visit: Payer: Self-pay

## 2020-08-15 ENCOUNTER — Ambulatory Visit (INDEPENDENT_AMBULATORY_CARE_PROVIDER_SITE_OTHER): Payer: Medicare Other | Admitting: Adult Health

## 2020-08-15 ENCOUNTER — Encounter: Payer: Self-pay | Admitting: Adult Health

## 2020-08-15 VITALS — BP 138/90 | HR 90 | Ht 64.0 in | Wt 202.0 lb

## 2020-08-15 DIAGNOSIS — E785 Hyperlipidemia, unspecified: Secondary | ICD-10-CM

## 2020-08-15 DIAGNOSIS — E1129 Type 2 diabetes mellitus with other diabetic kidney complication: Secondary | ICD-10-CM

## 2020-08-15 DIAGNOSIS — I63531 Cerebral infarction due to unspecified occlusion or stenosis of right posterior cerebral artery: Secondary | ICD-10-CM | POA: Diagnosis not present

## 2020-08-15 DIAGNOSIS — R809 Proteinuria, unspecified: Secondary | ICD-10-CM

## 2020-08-15 DIAGNOSIS — I10 Essential (primary) hypertension: Secondary | ICD-10-CM

## 2020-08-15 DIAGNOSIS — IMO0002 Reserved for concepts with insufficient information to code with codable children: Secondary | ICD-10-CM

## 2020-08-15 DIAGNOSIS — E1165 Type 2 diabetes mellitus with hyperglycemia: Secondary | ICD-10-CM

## 2020-08-15 NOTE — Progress Notes (Signed)
[facility] [address] [place]. [place] [address] [phone]       STROKE FOLLOW UP NOTE  [name] Date of Birth:  Mar 06, 1951 Medical Record Number:  [id number]   Reason for Referral: stroke follow up    CHIEF COMPLAINT:  Chief Complaint  Patient presents with  . Follow-up    rm 9, stroke fu, with sister, pt states she is doing well, she has had multiple eye procedures     HPI:  Today, 08/15/2020, Meagan [name] returns for stroke follow-up accompanied by her sister.  Reports residual deficits of mild dysarthria, vision impairment, left sided weakness and gait impairment. 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 Denies new or worsening stroke/TIA symptoms. Has had additional vision procedure recently for diabetic retinopathy including steroid injections as laser procedure not beneficial. Per patient, opthalmology reported slight improvement but not yet released to drive. Scheduled follow up visit scheduled in December or January. She continues to live with her sister and is since retired. Remains on plavix and atorvastatin without side effects. Blood pressure today 138/90. DM stable. No further concerns at this time.     History provided for reference purposes only Update 02/08/2020 JM: Meagan [name] returns for stroke follow-up accompanied by her sister.  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  Recent ophthalmologic bilateral laser surgery for "increased pressure with vessel leakage" and denies benefit of vision improvement.  Continues to work with outpatient therapy with ongoing improvement.  Continues on Plavix and atorvastatin for secondary stroke prevention.  Blood pressure today 151/84.  She is concerned regarding recent elevated blood pressures with ongoing monitoring at home and does continue to follow with  PCP for ongoing management.  DM stable.  Continues to follow with PCP regularly.  No further neurological concerns at this time.  Update 11/11/2019: Meagan [name] is a 69 year old female who is being seen today by request of her sister due to worsening appetite with weight loss. Established patient with prior history of stroke. 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  Continues on plavix and atorvastatin for secondary stroke prevention without side effects.  Blood pressure today 123/78.  30-day cardiac event monitor completed which was negative for atrial fibrillation. Since prior visit, she has had 2 ED admissions both appearing to be due to dehydration and decreased appetite.  Continues to follow with PCP for ongoing monitoring and management with adjustment of medications due to possible side effects. Sister is very concerned regarding ongoing decreased appetite and weight loss. Denies new or worsening stroke/TIA symptoms.   Initial visit 08/06/2019: Meagan [name] is a 69 year old female who is being seen today for stroke follow-up accompanied by her sister.  She has since been discharged from SNF rehab and currently residing with her sister.  Residual deficits of left homonymous hemianopia, left hemiparesis and delayed recall.  She continues to participate in outpatient PT/OT in [place], [place] with ongoing improvement.  Continues to ambulate with rolling walker.  She denies improvement of her vision.  She has had evaluation by her retina specialist since hospital discharge and has follow-up visit in December.  She does report worsening of vision and headaches with eyestrain.  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  She has not returned to driving due to decreased vision.  She continues on  aspirin and Plavix without bleeding or bruising.  Continues on atorvastatin without myalgias.  Blood pressure today 136/89.   She did receive 30-day cardiac event but per patient, has attempted to reach out to her cardiologist in regards to need of monitoring and has not received return phone call.  No further concerns at this time.  Stroke admission 07/01/2019: Meagan [name] is a 69 y.o. female with history of NIDDM, HTN, OSA who had recent eye injections  presented on 07/01/2019 with HA, difficulty looking to the L that did not resolve and L sided numbness since 9/20.  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  MRI showed acute right parieto-occipital junction infarct along with small vessel disease.  MRA head showed arthrosclerosis bilateral PCAs with focal stenosis right P1/P2 junction.  MRA neck motion degraded.  CTA head neck right ICA 45% stenosis, right ECA 60%, left CCA 30% stenosis.  2D echo unremarkable.  Recommended 30-day cardiac event monitor outpatient to rule out atrial fibrillation. LDL 109.  A1c 10.1.  Initiated DAPT for 3 months due to intracranial stenosis and Plavix alone as previously on aspirin.  Uncontrolled DM and recommend close PCP follow-up.  HTN stable.  Change of statin therapy to atorvastatin 80 mg daily.  Other stroke risk factors include advanced age, obesity and OSA but no prior history of stroke.  Residual deficits of left homonymous hemianopia and recommended follow-up with ophthalmology prior to return to driving.  She also had residual right hemiparesis and discharged to SNF on 07/08/2019 for ongoing therapy.      ROS:   14 system review of systems performed and negative with exception of those listed in HPI   PMH:  Past Medical History:  Diagnosis Date  . Cellulitis    left leg  . 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 Diffuse cystic mastopathy   . Fall 2013  . Hypertension 2004  . Occlusion and stenosis of carotid artery without mention of cerebral infarction   . Sleep  apnea   . Stroke [facility])     PSH:  Past Surgical History:  Procedure Laterality Date  . APPENDECTOMY  2000s  . BIOPSY THYROID  2006   [facility]  . BREAST EXCISIONAL BIOPSY Left 2000s   neg  . CARPAL TUNNEL RELEASE Left March 2015  . COLONOSCOPY  2011   DR.WOHL  . COLONOSCOPY WITH PROPOFOL N/A 07/11/2017   Procedure: COLONOSCOPY WITH PROPOFOL;  Surgeon: Jonathon Bellows, MD;  Location: [facility] ENDOSCOPY;  Service: Gastroenterology;  Laterality: N/A;  . ganglion cyst removal     . KNEE SURGERY Left 08/2009  . KNEE SURGERY Right 04/07/2010  . ROTATOR CUFF REPAIR Right 2008  . TUBAL LIGATION      Social History:  Social History   Socioeconomic History  . Marital status: Widowed    Spouse name: Not on file  . Number of children: 4  . Years of education: Not on file  . Highest education level: Master's degree (e.g., MA, MS, MEng, MEd, MSW, MBA)  Occupational History  . Occupation: Higher education careers adviser   Tobacco Use  . Smoking status: Never Smoker  . Smokeless tobacco: Never Used  Vaping Use  . Vaping Use: Never used  Substance and Sexual Activity  . Alcohol use: No  . Drug use: No  . Sexual activity: Not Currently    Partners: Male    Comment: Widow for 14  years  Other Topics Concern  . Not on file  Social History Narrative   She had a stroke on 06/2019, she has left peripheral vision loss since stroke   She moved in with her youngest sister    Social Determinants of Health   Financial Resource Strain:   . Difficulty of Paying Living Expenses: Not on file  Food Insecurity:   . Worried About Charity fundraiser in the Last Year: Not on file  . Ran Out of Food in the Last Year: Not on file  Transportation Needs:   . Lack of Transportation (Medical): Not on file  . Lack of Transportation (Non-Medical): Not on file  Physical Activity:   . Days of Exercise per Week: Not on file  . Minutes of Exercise per Session: Not on file  Stress:   . Feeling of Stress : Not on file  Social  Connections:   . Frequency of Communication with Friends and Family: Not on file  . Frequency of Social Gatherings with Friends and Family: Not on file  . Attends Religious Services: Not on file  . Active Member of Clubs or Organizations: Not on file  . Attends Archivist Meetings: Not on file  . Marital Status: Not on file  Intimate Partner Violence:   . Fear of Current or Ex-Partner: Not on file  . Emotionally Abused: Not on file  . Physically Abused: Not on file  . Sexually Abused: Not on file    Family History:  Family History  Problem Relation Age of Onset  . Breast cancer Maternal Aunt   . Hypertension Mother   . Kidney Stones Mother   . Hypertension Father   . Congestive Heart Failure Father   . Hypertension Paternal Grandfather   . Diabetes Sister   . Vision loss Sister   . Varicose Veins Sister   . Hypertension Sister   . Breast cancer Sister 75       Double Mastectomy  . Hypertension Sister     Medications:   Current Outpatient Medications on File Prior to Visit  Medication Sig Dispense Refill  . amLODipine (NORVASC) 5 MG tablet TAKE 1 TABLET BY MOUTH EVERY DAY 90 tablet 0  . atorvastatin (LIPITOR) 80 MG tablet TAKE 1 TABLET (80 MG TOTAL) BY MOUTH DAILY AT 6 PM. 90 tablet 3  . blood glucose meter kit and supplies Dispense based on patient and insurance preference. Use up to four times daily as directed. (FOR ICD-10 E10.9, E11.9). 1 each 0  . clopidogrel (PLAVIX) 75 MG tablet TAKE 1 TABLET BY MOUTH EVERY DAY 90 tablet 1  . docusate sodium (COLACE) 100 MG capsule Take 1 capsule (100 mg total) by mouth daily as needed for mild constipation. 60 capsule 0  . glucose blood (ONETOUCH ULTRA) test strip USE UP TO 4 TIMES DAILY AS DIRECTED 200 strip 2  . hydrALAZINE (APRESOLINE) 10 MG tablet Take 1 tablet (10 mg total) by mouth 3 (three) times daily as needed. If bp goes above 150/90 30 tablet 0  . Insulin Detemir (LEVEMIR FLEXTOUCH) 100 UNIT/ML Pen Inject 25-50  Units into the skin daily. (Patient taking differently: Inject 17 Units into the skin daily after breakfast. ) 15 mL 2  . Insulin Pen Needle 32G X 4 MM MISC 1 each by Does not apply route daily. 100 each 11  . levocetirizine (XYZAL) 5 MG tablet Take 5 mg by mouth daily as needed for allergies.     Marland Kitchen olmesartan-hydrochlorothiazide (  BENICAR HCT) 40-25 MG tablet Take 1 tablet by mouth daily. 90 tablet 1  . polyethylene glycol (MIRALAX / GLYCOLAX) 17 g packet Take 17 g by mouth daily. 14 each 0  . Semaglutide (RYBELSUS) 14 MG TABS Take 1 tablet by mouth daily. Double your current dose until out before filling this rx 90 tablet 0   No current facility-administered medications on file prior to visit.    Allergies:   Allergies  Allergen Reactions  . Bee Venom Swelling and Other (See Comments)    Immediately takes Benadryl to counteract  . Wilder Glade [Dapagliflozin] Rash    Pt reports that she developed a rash when she took [place] and she would like it to be added as an allergy  . Keflex [Cephalexin] Rash     Physical Exam  Vitals:   08/15/20 1531  BP: 138/90  Pulse: 90  Weight: 202 lb (91.6 kg)  Height: _0  (1.626 m)   Body mass index is 34.67 kg/m. No exam data present   General: well developed, well nourished, pleasant middle-aged African-American female, seated, in no evident distress Head: head normocephalic and atraumatic.   Neck: supple with no carotid or supraclavicular bruits Cardiovascular: regular rate and rhythm, no murmurs Musculoskeletal: no deformity Skin:  no rash/petichiae Vascular:  Normal pulses all extremities   Neurologic Exam Mental Status: Awake and fully alert.  Mild dysarthria with occasional speech hesitancy. Oriented to place and time. Recent and remote memory intact. Attention span, concentration and fund of knowledge appropriate but delayed. Mood and affect appropriate.  Cranial Nerves: Pupils equal, briskly reactive to light. Extraocular movements  full without nystagmus. Visual fields left inferior homonymous quadrantanopia. Hearing intact. Facial sensation intact.  Mild left lower facial weakness.  Motor: Normal bulk and tone. Normal strength in all tested extremity muscles; limited L shoulder ROM Sensory.: intact to pinprick , position and vibratory sensation with slightly decreased sensation left side.  Coordination: Rapid alternating movements normal in all extremities except slightly decreased left hand dexterity. Finger-to-nose and heel-to-shin performed with ataxia on left side. Gait and Station: Arises from chair without difficulty. Stance is normal. Gait demonstrates normal stride length and balance with use of cane Reflexes: 1+ and symmetric. Toes downgoing.       ASSESSMENT/PLAN: Meagan [name] is a 69 y.o. 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 Vascular risk factors include HTN, HLD, DM, OSA and intercranial stenosis.       1. Right PCA stroke:  a. Residual deficits: mild Dysarthria, LLE ataxia, gait impairment and left peripheral vision impairment. Continue working with therapy for L shoulder pain ?adhesive capsulitis for continued improvement  b. Continue clopidogrel 75 mg daily  and atorvastatin for secondary stroke prevention.   c. Discussed secondary stroke prevention measures and importance of close PCP follow-up for aggressive stroke risk factor management 2. HTN: BP goal <130/90.  Stable on hydralazine, Benicar and amlodipine per PCP 3. HLD: LDL goal<70. On atorvastatin 80 mg daily  Per PCP 4. DMII: A1c goal<7. On Levemir per PCP. Continues to follow with ophthalmology for diabetic retinopathy.     Follow up in 6  months or call earlier if needed   CC:  [facility] provider: Dr. Midge Minium, Drue Stager, MD    I spent 35 minutes of face-to-face and non-face-to-face time with  patient and sister.  This included previsit chart review, lab review, study review,  order entry, electronic health record documentation, patient education regarding prior stroke and residual deficits, importance of managing stroke risk factors and answered all questions to patient satisfaction   Frann [name], [facility]  [facility] [address] [place], [place] [address]  Phone [phone] Fax [phone] Note: This document was prepared with digital dictation and possible smart phrase technology. Any transcriptional errors that result from this process are unintentional.

## 2020-08-15 NOTE — Patient Instructions (Addendum)
Continue working with therapy and further discuss possible benefit of dry needling for left shoulder and neck pain  Continue to follow up with eye doctor regarding vision and further clearance for driving  Continue clopidogrel 75 mg daily  and atorvastatin  for secondary stroke prevention  Continue to follow up with PCP regarding cholesterol, blood pressure and diabetes management  Maintain strict control of hypertension with blood pressure goal below 130/90, diabetes with hemoglobin A1c goal below 7% and cholesterol with LDL cholesterol (bad cholesterol) goal below 70 mg/dL.      Followup in the future with me in 6 months or call earlier if needed      Thank you for coming to see [place] at [facility]. I hope we have been able to provide you high quality care today.  You may receive a patient satisfaction survey over the next few weeks. We would appreciate your feedback and comments so that we may continue to improve ourselves and the health of our patients.     Adhesive Capsulitis  Adhesive capsulitis, also called frozen shoulder, causes the shoulder to become stiff and painful to move. This condition happens when there is inflammation of the tendons and ligaments that surround the shoulder joint (shoulder capsule). What are the causes? This condition may be caused by:  An injury to your shoulder joint.  Straining your shoulder.  Not moving your shoulder for a period of time. This can happen if your arm was injured or in a sling.  Long-standing conditions, such as: ? Diabetes. ? Thyroid problems. ? Heart disease. ? Stroke. ? Rheumatoid arthritis. ? Lung disease. In some cases, the cause is not known. What increases the risk? You are more likely to develop this condition if you are:  A woman.  Older than 67 years of age. What are the signs or symptoms? Symptoms of this condition include:  Pain in your shoulder when you move your arm. There may  also be pain when parts of your shoulder are touched. The pain may be worse at night or when you are resting.  A sore or aching shoulder.  The inability to move your shoulder normally.  Muscle spasms. How is this diagnosed? This condition is diagnosed with a physical exam and imaging tests, such as an X-ray or MRI. How is this treated? This condition may be treated with:  Treatment of the underlying cause or condition.  Medicine. Medicine may be given to relieve pain, inflammation, or muscle spasms.  Steroid injections into the shoulder joint.  Physical therapy. This involves performing exercises to get the shoulder moving again.  Acupuncture. This is a type of treatment that involves stimulating specific points on your body by inserting thin needles through your skin.  Shoulder manipulation. This is a procedure to move the shoulder into another position. It is done after you are given a medicine to make you fall asleep (general anesthetic). The joint may also be injected with salt water at high pressure to break down scarring.  Surgery. This may be done in severe cases when other treatments have failed. Although most people recover completely from adhesive capsulitis, some may not regain full shoulder movement. Follow these instructions at home: Managing pain, stiffness, and swelling      If directed, put ice on the injured area: ? Put ice in a plastic bag. ? Place a towel between your skin and the bag. ? Leave the ice on for 20 minutes, 2-3 times per day.  If directed,  apply heat to the affected area before you exercise. Use the heat source that your health care provider recommends, such as a moist heat pack or a heating pad. ? Place a towel between your skin and the heat source. ? Leave the heat on for 20-30 minutes. ? Remove the heat if your skin turns bright red. This is especially important if you are unable to feel pain, heat, or cold. You may have a greater risk of  getting burned. General instructions  Take over-the-counter and prescription medicines only as told by your health care provider.  If you are being treated with physical therapy, follow instructions from your physical therapist.  Avoid exercises that put a lot of demand on your shoulder, such as throwing. These exercises can make pain worse.  Keep all follow-up visits as told by your health care provider. This is important. Contact a health care provider if:  You develop new symptoms.  Your symptoms get worse. Summary  Adhesive capsulitis, also called frozen shoulder, causes the shoulder to become stiff and painful to move.  You are more likely to have this condition if you are a woman and over age 44.  It is treated with physical therapy, medicines, and sometimes surgery. This information is not intended to replace advice given to you by your health care provider. Make sure you discuss any questions you have with your health care provider. Document Revised: 02/28/2018 Document Reviewed: 02/28/2018 Elsevier Patient Education  [place].

## 2020-08-16 NOTE — Progress Notes (Signed)
I agree with the above plan 

## 2020-08-24 ENCOUNTER — Other Ambulatory Visit: Payer: Self-pay | Admitting: Family Medicine

## 2020-08-29 ENCOUNTER — Other Ambulatory Visit: Payer: Self-pay

## 2020-08-29 ENCOUNTER — Ambulatory Visit (INDEPENDENT_AMBULATORY_CARE_PROVIDER_SITE_OTHER): Payer: Medicare Other | Admitting: Family Medicine

## 2020-08-29 ENCOUNTER — Encounter: Payer: Self-pay | Admitting: Family Medicine

## 2020-08-29 VITALS — BP 142/92 | HR 91 | Temp 97.9°F | Resp 16 | Ht 64.0 in | Wt 203.8 lb

## 2020-08-29 DIAGNOSIS — R809 Proteinuria, unspecified: Secondary | ICD-10-CM | POA: Diagnosis not present

## 2020-08-29 DIAGNOSIS — M7542 Impingement syndrome of left shoulder: Secondary | ICD-10-CM | POA: Diagnosis not present

## 2020-08-29 DIAGNOSIS — I6523 Occlusion and stenosis of bilateral carotid arteries: Secondary | ICD-10-CM | POA: Diagnosis not present

## 2020-08-29 DIAGNOSIS — E1129 Type 2 diabetes mellitus with other diabetic kidney complication: Secondary | ICD-10-CM

## 2020-08-29 DIAGNOSIS — I152 Hypertension secondary to endocrine disorders: Secondary | ICD-10-CM

## 2020-08-29 DIAGNOSIS — E1159 Type 2 diabetes mellitus with other circulatory complications: Secondary | ICD-10-CM | POA: Diagnosis not present

## 2020-08-29 DIAGNOSIS — E1165 Type 2 diabetes mellitus with hyperglycemia: Secondary | ICD-10-CM | POA: Diagnosis not present

## 2020-08-29 DIAGNOSIS — IMO0002 Reserved for concepts with insufficient information to code with codable children: Secondary | ICD-10-CM

## 2020-08-29 MED ORDER — CARVEDILOL 6.25 MG PO TABS: 6.2500 mg | ORAL_TABLET | Freq: Two times a day (BID) | ORAL | 0 refills | Status: DC

## 2020-08-29 NOTE — Patient Instructions (Signed)
Increase Levermir from 20 units to 22 units and go up by 2 units every 3 days to keep fasting sugar between 100-140

## 2020-08-29 NOTE — Progress Notes (Signed)
Name: Meagan [name]   MRN: [id number]    DOB: 05/18/1951   Date:08/29/2020       Progress Note  Subjective  Chief Complaint  Follow up  HPI   DMII: she states since she stopped Ozempic she has been able to eat again, her weigh is going up , however her glucose has been in the 180's fasting and up to 200 once, , stay on 20 units of Levemir she would like to control her appetite and asked to resume Ozempic, explained that I am afraid of doing so since it caused significant lack of appetite, we switch to Rybelsus initially at 7 mg and in the past 2 months she has been on 14 mg. She states it helps curb her appetite, but she is still having sweets before bed at least twice a week. Explained that she needs to change her diet   HTN: bp at home has been mostlyin the 140's range in am's, sister is now only giving her hydralazine prn again, she is on Benicar 40/25 and bp today still in the 140's, we will add Carvedilol since heart rate also a little elevated and monitor   Left shoulder pain: she is getting PT and states is has improved but needs to have more sessions to help with pain and rom since not back to normal we will refer her to Meagan [name]   Patient Active Problem List   Diagnosis Date Noted   History of CVA with residual deficit 07/01/2019   Leukocytosis 07/01/2019   Polycythemia 07/01/2019   Morbid obesity (Meagan [name]) 10/22/2018   Non compliance with medical treatment 03/26/2018   Dyslipidemia associated with type 2 diabetes mellitus ([place]) 12/04/2016   Uncontrolled type 2 diabetes mellitus with microalbuminuria, without long-term current use of insulin ([place]) 12/04/2016   Seasonal allergies 02/01/2016   OSA (obstructive sleep apnea) 10/06/2015   Hypertensive retinopathy 10/06/2015   Thyroid cyst 10/06/2015   Dyslipidemia 10/06/2015   Type 2 diabetes, uncontrolled, with neuropathy ([place]) 10/06/2015   History of pneumonia 10/06/2015   Chronic neck pain 10/06/2015    Carotid stenosis    Hypertension, benign     Past Surgical History:  Procedure Laterality Date   APPENDECTOMY  2000s   BIOPSY THYROID  2006   [facility]   BREAST EXCISIONAL BIOPSY Left 2000s   neg   CARPAL TUNNEL RELEASE Left March 2015   COLONOSCOPY  2011   Meagan [name]   COLONOSCOPY WITH PROPOFOL N/A 07/11/2017   Procedure: COLONOSCOPY WITH PROPOFOL;  Surgeon: Meagan Bellows, MD;  Location: [name] ENDOSCOPY;  Service: Gastroenterology;  Laterality: N/A;   ganglion cyst removal      KNEE SURGERY Left 08/2009   KNEE SURGERY Right 04/07/2010   ROTATOR CUFF REPAIR Right 2008   TUBAL LIGATION      Family History  Problem Relation Age of Onset   Breast cancer Maternal Aunt    Hypertension Mother    Kidney Stones Mother    Hypertension Father    Congestive Heart Failure Father    Hypertension Paternal Grandfather    Diabetes Sister    Vision loss Sister    Varicose Veins Sister    Hypertension Sister    Breast cancer Sister 9       Double Mastectomy   Hypertension Sister     Social History   Tobacco Use   Smoking status: Never Smoker   Smokeless tobacco: Never Used  Substance Use Topics   Alcohol use: No  Current Outpatient Medications:    amLODipine (NORVASC) 5 MG tablet, TAKE 1 TABLET BY MOUTH EVERY DAY, Disp: 90 tablet, Rfl: 0   atorvastatin (LIPITOR) 80 MG tablet, TAKE 1 TABLET (80 MG TOTAL) BY MOUTH DAILY AT 6 PM., Disp: 90 tablet, Rfl: 3   BD PEN NEEDLE NANO 2ND GEN 32G X 4 MM MISC, USE AS DIRECTED DAILY, Disp: 100 each, Rfl: 11   blood glucose meter kit and supplies, Dispense based on patient and insurance preference. Use up to four times daily as directed. (FOR ICD-10 E10.9, E11.9)., Disp: 1 each, Rfl: 0   clopidogrel (PLAVIX) 75 MG tablet, TAKE 1 TABLET BY MOUTH EVERY DAY, Disp: 90 tablet, Rfl: 1   docusate sodium (COLACE) 100 MG capsule, Take 1 capsule (100 mg total) by mouth daily as needed for mild constipation., Disp: 60 capsule, Rfl:  0   glucose blood (ONETOUCH ULTRA) test strip, USE UP TO 4 TIMES DAILY AS DIRECTED, Disp: 200 strip, Rfl: 2   hydrALAZINE (APRESOLINE) 10 MG tablet, Take 1 tablet (10 mg total) by mouth 3 (three) times daily as needed. If bp goes above 150/90, Disp: 30 tablet, Rfl: 0   hydrochlorothiazide (MICROZIDE) 12.5 MG capsule, Take 12.5 mg by mouth daily., Disp: , Rfl:    Insulin Detemir (LEVEMIR FLEXTOUCH) 100 UNIT/ML Pen, Inject 25-50 Units into the skin daily. (Patient taking differently: Inject 17 Units into the skin daily after breakfast. ), Disp: 15 mL, Rfl: 2   levocetirizine (XYZAL) 5 MG tablet, Take 5 mg by mouth daily as needed for allergies. , Disp: , Rfl:    olmesartan-hydrochlorothiazide (BENICAR HCT) 40-25 MG tablet, Take 1 tablet by mouth daily., Disp: 90 tablet, Rfl: 1   polyethylene glycol (MIRALAX / GLYCOLAX) 17 g packet, Take 17 g by mouth daily., Disp: 14 each, Rfl: 0   Semaglutide (RYBELSUS) 14 MG TABS, Take 1 tablet by mouth daily. Double your current dose until out before filling this rx, Disp: 90 tablet, Rfl: 0  Allergies  Allergen Reactions   Bee Venom Swelling and Other (See Comments)    Immediately takes Benadryl to counteract   [place] [Dapagliflozin] Rash    Pt reports that she developed a rash when she took [place] and she would like it to be added as an allergy   Keflex [Cephalexin] Rash    I personally reviewed active problem list, medication list, allergies, family history, social history, health maintenance, notes from last encounter with the patient/caregiver today.   ROS  Constitutional: Negative for fever or weight change.  Respiratory: Negative for cough and shortness of breath.   Cardiovascular: Negative for chest pain or palpitations.  Gastrointestinal: Negative for abdominal pain, no bowel changes.  Musculoskeletal: Positive for gait problem but no  joint swelling.  Skin: Negative for rash.  Neurological: Negative for dizziness or headache.  No  other specific complaints in a complete review of systems (except as listed in HPI above).  Objective  Vitals:   08/29/20 1420  BP: (!) 142/92  Pulse: 91  Resp: 16  Temp: 97.9 F (36.6 C)  TempSrc: Oral  SpO2: 93%  Weight: 203 lb 12.8 oz (92.4 kg)  Height: '5\' 4"'  (1.626 m)    Body mass index is 34.98 kg/m.  Physical Exam  Constitutional: Patient appears well-developed and well-nourished. Obese  No distress.  HEENT: head atraumatic, normocephalic, pupils equal and reactive to light, neck supple, throat within normal limits Cardiovascular: Normal rate, regular rhythm and normal heart sounds.  No murmur heard.  No BLE edema. Pulmonary/Chest: Effort normal and breath sounds normal. No respiratory distress. Abdominal: Soft.  There is no tenderness. Psychiatric: Patient has a normal mood and affect. behavior is normal. Judgment and thought content normal.  Recent Results (from the past 2160 hour(s))  HM DIABETES EYE EXAM     Status: Abnormal   Collection Time: 06/30/20 12:00 AM  Result Value Ref Range   HM Diabetic Eye Exam Retinopathy (A) No Retinopathy    Comment: Dr. Matilde Sprang  POCT HgB A1C     Status: Abnormal   Collection Time: 07/01/20  2:56 PM  Result Value Ref Range   Hemoglobin A1C 7.5 (A) 4.0 - 5.6 %   HbA1c POC (<> result, manual entry)     HbA1c, POC (prediabetic range)     HbA1c, POC (controlled diabetic range)        PHQ2/9: Depression screen [facility] 2/9 08/29/2020 07/01/2020 03/01/2020 01/15/2020 12/02/2019  Decreased Interest 0 0 0 0 0  Down, Depressed, Hopeless 0 0 0 0 0  PHQ - 2 Score 0 0 0 0 0  Altered sleeping - - 0 0 0  Tired, decreased energy - - 0 0 0  Change in appetite - - 0 0 0  Feeling bad or failure about yourself  - - 0 0 0  Trouble concentrating - - 0 0 0  Moving slowly or fidgety/restless - - 0 0 0  Suicidal thoughts - - 0 0 0  PHQ-9 Score - - 0 0 0  Difficult doing work/chores - - Not difficult at all - -  Some recent data might be hidden    phq  9 is negative   Fall Risk: Fall Risk  08/29/2020 07/01/2020 03/01/2020 01/15/2020 12/02/2019  Falls in the past year? 0 0 0 0 0  Number falls in past yr: 0 0 0 0 0  Injury with Fall? 0 0 0 0 0  Risk for fall due to : Impaired balance/gait - - - -  Follow up - - Falls evaluation completed - -    Functional Status Survey: Is the patient deaf or have difficulty hearing?: No Does the patient have difficulty seeing, even when wearing glasses/contacts?: Yes Does the patient have difficulty concentrating, remembering, or making decisions?: No Does the patient have difficulty walking or climbing stairs?: No Does the patient have difficulty dressing or bathing?: No Does the patient have difficulty doing errands alone such as visiting a doctor's office or shopping?: Yes    Assessment & Plan  1. Uncontrolled type 2 diabetes mellitus with microalbuminuria, without long-term current use of insulin (HCC)  We will increase Tresiba to 22 units and titrate as necessary   2. Hypertension associated with diabetes ([place])  - carvedilol (COREG) 6.25 MG tablet; Take 1 tablet (6.25 mg total) by mouth 2 (two) times daily with a meal.  Dispense: 180 tablet; Refill: 0  3. Impingement syndrome of left shoulder  - Ambulatory referral to Orthopedic Surgery

## 2020-09-02 ENCOUNTER — Other Ambulatory Visit: Payer: Self-pay | Admitting: Family Medicine

## 2020-09-02 NOTE — Telephone Encounter (Signed)
   Notes to clinic: not on current medication list Review for refill   Requested Prescriptions  Pending Prescriptions Disp Refills   hydrochlorothiazide (MICROZIDE) 12.5 MG capsule [Pharmacy Med Name: HYDROCHLOROTHIAZIDE 12.5 MG CP] 30 capsule 1    Sig: TAKE 1 CAPSULE BY MOUTH DAILY ONLY WHILE TAKING OLMESARTAN HCTZ 40/12.5      Cardiovascular: Diuretics - Thiazide Failed - 09/02/2020  1:30 AM      Failed - Last BP in normal range    BP Readings from Last 1 Encounters:  08/29/20 (!) 142/92          Passed - Ca in normal range and within 360 days    Calcium  Date Value Ref Range Status  12/02/2019 9.5 8.9 - 10.3 mg/dL Final          Passed - Cr in normal range and within 360 days    Creat  Date Value Ref Range Status  10/08/2019 0.91 0.50 - 0.99 mg/dL Final    Comment:    For patients >14 years of age, the reference limit for Creatinine is approximately 13% higher for people identified as African-American. .    Creatinine, Ser  Date Value Ref Range Status  12/02/2019 0.94 0.44 - 1.00 mg/dL Final          Passed - K in normal range and within 360 days    Potassium  Date Value Ref Range Status  12/02/2019 3.8 3.5 - 5.1 mmol/L Final          Passed - Na in normal range and within 360 days    Sodium  Date Value Ref Range Status  12/02/2019 136 135 - 145 mmol/L Final  01/31/2016 138 134 - 144 mmol/L Final          Passed - Valid encounter within last 6 months    Recent Outpatient Visits           4 days ago Uncontrolled type 2 diabetes mellitus with microalbuminuria, without long-term current use of insulin [facility])   [facility] [place], Drue Stager, MD   2 months ago Uncontrolled type 2 diabetes mellitus with microalbuminuria, without long-term current use of insulin [facility])   [facility] [place], Drue Stager, MD   6 months ago Dyslipidemia associated with type 2 diabetes mellitus [facility])   [facility] [place],  Drue Stager, MD   7 months ago Ingrown nail of great toe of left foot   [facility] [place], Drue Stager, MD   9 months ago Dyslipidemia associated with type 2 diabetes mellitus [facility])   [facility] Steele Sizer, MD       Future Appointments             In 2 months Ancil Boozer, Drue Stager, MD [facility]

## 2020-09-05 ENCOUNTER — Other Ambulatory Visit: Payer: Self-pay

## 2020-09-19 ENCOUNTER — Other Ambulatory Visit: Payer: Self-pay

## 2020-09-19 ENCOUNTER — Ambulatory Visit (INDEPENDENT_AMBULATORY_CARE_PROVIDER_SITE_OTHER): Payer: Medicare Other | Admitting: Cardiology

## 2020-09-19 ENCOUNTER — Encounter: Payer: Self-pay | Admitting: Cardiology

## 2020-09-19 VITALS — BP 134/70 | HR 75 | Ht 64.0 in | Wt 207.1 lb

## 2020-09-19 DIAGNOSIS — I6930 Unspecified sequelae of cerebral infarction: Secondary | ICD-10-CM | POA: Diagnosis not present

## 2020-09-19 DIAGNOSIS — I10 Essential (primary) hypertension: Secondary | ICD-10-CM | POA: Diagnosis not present

## 2020-09-19 NOTE — Patient Instructions (Signed)
Medication Instructions:  Your physician recommends that you continue on your current medications as directed. Please refer to the Current Medication list given to you today.  *If you need a refill on your cardiac medications before your next appointment, please call your pharmacy*   Follow-Up: At [facility], you and your health needs are our priority.  As part of our continuing mission to provide you with exceptional heart care, we have created designated Provider Care Teams.  These Care Teams include your primary Cardiologist (physician) and Advanced Practice Providers (APPs -  Physician Assistants and Nurse Practitioners) who all work together to provide you with the care you need, when you need it.  We recommend signing up for the patient portal called "MyChart".  Sign up information is provided on this After Visit Summary.  MyChart is used to connect with patients for Virtual Visits (Telemedicine).  Patients are able to view lab/test results, encounter notes, upcoming appointments, etc.  Non-urgent messages can be sent to your provider as well.   To learn more about what you can do with MyChart, go to [email or web address].    Your next appointment:   12 month(s)  The format for your next appointment:   In Person  Provider:   You may see Brian Agbor-Etang, MD or one of the following Advanced Practice Providers on your designated Care Team:    Christopher Berge, NP  Ryan Dunn, PA-C  Jacquelyn Visser, PA-C  Cadence Furth, PA-C  Caitlin Walker, NP   

## 2020-09-19 NOTE — Progress Notes (Signed)
Cardiology Office Note:    Date:  09/19/2020   ID:  Meagan [name], DOB Mar 22, 1951, MRN [id number]  PCP:  Steele Sizer, MD  Cardiologist:  Kate Sable, MD  Electrophysiologist:  None   Referring MD: Steele Sizer, MD   Chief Complaint  Patient presents with  . Other    12 month f/u no complaints today. Meds reviewed verbally with pt.    History of Present Illness:    Meagan [name] is a 69 y.o. female with a hx of hypertension, diabetes, CVA who presents for follow-up.  Previously seen due to hypertension and recent CVA.  At the time she presented to the ED with headaches, hypertension, brain MRI showed acute infarct in the right parieto-occipital junction leading to left-sided weakness.  Started on Plavix, Lipitor.  BP regimen was also adjusted.  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  Also has peripheral vision problems, edema in her erythema.  Seeing a retina specialist in 2 days for retina procedures/Lasix surgery.  Prior notes Cardiac monitor 08/2019 no arrhythmias, no atrial fibrillation. Echo 06/2019 EF normal, 60 to 65%, impaired relaxation   Past Medical History:  Diagnosis Date  . Cellulitis    left leg  . 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 Diffuse cystic mastopathy   . Fall 2013  . Hypertension 2004  . Occlusion and stenosis of carotid artery without mention of cerebral infarction   . Sleep apnea   . Stroke [facility])     Past Surgical History:  Procedure Laterality Date  . APPENDECTOMY  2000s  . BIOPSY THYROID  2006   [facility]  . BREAST EXCISIONAL BIOPSY Left 2000s   neg  . CARPAL TUNNEL RELEASE Left March 2015  . COLONOSCOPY  2011   DR.WOHL  . COLONOSCOPY WITH PROPOFOL N/A 07/11/2017   Procedure: COLONOSCOPY WITH PROPOFOL;  Surgeon: Jonathon Bellows, MD;  Location: [facility] ENDOSCOPY;  Service: Gastroenterology;  Laterality: N/A;  . ganglion cyst removal      . KNEE SURGERY Left 08/2009  . KNEE SURGERY Right 04/07/2010  . REFRACTIVE SURGERY Bilateral   . ROTATOR CUFF REPAIR Right 2008  . TUBAL LIGATION      Current Medications: Current Meds  Medication Sig  . amLODipine (NORVASC) 5 MG tablet TAKE 1 TABLET BY MOUTH EVERY DAY  . atorvastatin (LIPITOR) 80 MG tablet TAKE 1 TABLET (80 MG TOTAL) BY MOUTH DAILY AT 6 PM.  . BD PEN NEEDLE NANO 2ND GEN 32G X 4 MM MISC USE AS DIRECTED DAILY  . blood glucose meter kit and supplies Dispense based on patient and insurance preference. Use up to four times daily as directed. (FOR ICD-10 E10.9, E11.9).  . carvedilol (COREG) 6.25 MG tablet Take 1 tablet (6.25 mg total) by mouth 2 (two) times daily with a meal.  . clopidogrel (PLAVIX) 75 MG tablet TAKE 1 TABLET BY MOUTH EVERY DAY  . docusate sodium (COLACE) 100 MG capsule Take 1 capsule (100 mg total) by mouth daily as needed for mild constipation.  Marland Kitchen glucose blood (ONETOUCH ULTRA) test strip USE UP TO 4 TIMES DAILY AS DIRECTED  . hydrALAZINE (APRESOLINE) 10 MG tablet Take 1 tablet (10 mg total) by mouth 3 (three) times daily as needed. If bp goes above 150/90  . Insulin Detemir (LEVEMIR FLEXTOUCH) 100 UNIT/ML Pen Inject 25-50 Units into the skin daily. (Patient taking differently: Inject 17 Units into the skin daily after breakfast.)  .  levocetirizine (XYZAL) 5 MG tablet Take 5 mg by mouth daily as needed for allergies.   Marland Kitchen olmesartan-hydrochlorothiazide (BENICAR HCT) 40-25 MG tablet Take 1 tablet by mouth daily.  . polyethylene glycol (MIRALAX / GLYCOLAX) 17 g packet Take 17 g by mouth daily.  . Semaglutide (RYBELSUS) 14 MG TABS Take 1 tablet by mouth daily. Double your current dose until out before filling this rx     Allergies:   Bee venom, Farxiga [dapagliflozin], and Keflex [cephalexin]   Social History   Socioeconomic History  . Marital status: Widowed    Spouse name: Not on file  . Number of children: 4  . Years of education: Not on file  .  Highest education level: Master's degree (e.g., MA, MS, MEng, MEd, MSW, MBA)  Occupational History  . Occupation: Higher education careers adviser   Tobacco Use  . Smoking status: Never Smoker  . Smokeless tobacco: Never Used  Vaping Use  . Vaping Use: Never used  Substance and Sexual Activity  . Alcohol use: No  . Drug use: No  . Sexual activity: Not Currently    Partners: Male    Comment: Widow for 14 years  Other Topics Concern  . Not on file  Social History Narrative   She had a stroke on 06/2019, she has left peripheral vision loss since stroke   She moved in with her youngest sister    Social Determinants of Radio broadcast assistant Strain: Not on file  Food Insecurity: Not on file  Transportation Needs: Not on file  Physical Activity: Not on file  Stress: Not on file  Social Connections: Not on file     Family History: The patient's family history includes Breast cancer in her maternal aunt; Breast cancer (age of onset: 53) in her sister; Congestive Heart Failure in her father; Diabetes in her sister; Hypertension in her father, mother, paternal grandfather, sister, and sister; Kidney Stones in her mother; Varicose Veins in her sister; Vision loss in her sister.  ROS:   Please see the history of present illness.     All other systems reviewed and are negative.  EKGs/Labs/Other Studies Reviewed:    The following studies were reviewed today:   EKG:  EKG is  ordered today.  The ekg ordered today demonstrates normal sinus rhythm, normal ECG  Recent Labs: 10/08/2019: Magnesium 1.5 12/02/2019: ALT 20; BUN 22; Creatinine, Ser 0.94; Hemoglobin 11.8; Platelets 367; Potassium 3.8; Sodium 136  Recent Lipid Panel    Component Value Date/Time   CHOL 89 09/18/2019 0000   CHOL 200 (H) 01/31/2016 1115   TRIG 100 09/18/2019 0000   HDL 30 (L) 09/18/2019 0000   HDL 34 (L) 01/31/2016 1115   CHOLHDL 3.0 09/18/2019 0000   VLDL 36 07/02/2019 0411   LDLCALC 40 09/18/2019 0000    Physical  Exam:    VS:  BP 134/70 (BP Location: Right Arm, Patient Position: Sitting, Cuff Size: Normal)   Pulse 75   Ht '5\' 4"'  (1.626 m)   Wt 207 lb 2 oz (94 kg)   SpO2 98%   BMI 35.55 kg/m     Wt Readings from Last 3 Encounters:  09/19/20 207 lb 2 oz (94 kg)  08/29/20 203 lb 12.8 oz (92.4 kg)  08/15/20 202 lb (91.6 kg)     GEN:  Well nourished, well developed in no acute distress, HEENT: Normal NECK: No JVD; No carotid bruits LYMPHATICS: No lymphadenopathy CARDIAC: RRR, no murmurs, rubs, gallops RESPIRATORY:  Clear to  auscultation without rales, wheezing or rhonchi  ABDOMEN: Soft, non-tender, non-distended MUSCULOSKELETAL:  No edema; left arm weakness noted SKIN: Warm and dry NEUROLOGIC:  Alert and oriented x 3 PSYCHIATRIC:  Normal affect   ASSESSMENT:    1. Primary hypertension   2. History of CVA with residual deficit      PLAN:    1.  Hypertension.  BP controlled, continue Coreg, amlodipine Benicar/hct as prescribed. 2.  History of CVA, left-sided weakness.  Prior cardiac monitor with no A. fib or flutter.  Continue Plavix, Lipitor.  LDL at goal.  Continue PT  Follow-up in a year.  Total encounter time more than 45 minutes  Greater than 50% was spent in counseling and coordination of care with the patient  This note was generated in part or whole with voice recognition software. Voice recognition is usually quite accurate but there are transcription errors that can and very often do occur. I apologize for any typographical errors that were not detected and corrected.   Medication Adjustments/Labs and Tests Ordered: Current medicines are reviewed at length with the patient today.  Concerns regarding medicines are outlined above.  Orders Placed This Encounter  Procedures  . EKG 12-Lead   No orders of the defined types were placed in this encounter.   Patient Instructions  Medication Instructions:  Your physician recommends that you continue on your current  medications as directed. Please refer to the Current Medication list given to you today.  *If you need a refill on your cardiac medications before your next appointment, please call your pharmacy*  Follow-Up: At [facility], you and your health needs are our priority.  As part of our continuing mission to provide you with exceptional heart care, we have created designated Provider Care Teams.  These Care Teams include your primary Cardiologist (physician) and Advanced Practice Providers (APPs -  Physician Assistants and Nurse Practitioners) who all work together to provide you with the care you need, when you need it.  We recommend signing up for the patient portal called "MyChart".  Sign up information is provided on this After Visit Summary.  MyChart is used to connect with patients for Virtual Visits (Telemedicine).  Patients are able to view lab/test results, encounter notes, upcoming appointments, etc.  Non-urgent messages can be sent to your provider as well.   To learn more about what you can do with MyChart, go to [email or web address].    Your next appointment:   12 month(s)  The format for your next appointment:   In Person  Provider:   You may see Kate Sable, MD or one of the following Advanced Practice Providers on your designated Care Team:    Murray Hodgkins, NP  Christell Faith, PA-C  Marrianne Mood, PA-C  Cadence [place], [place]  Laurann [place], NP      Signed, Kate Sable, MD  09/19/2020 4:45 PM    [place]

## 2020-09-20 ENCOUNTER — Ambulatory Visit (INDEPENDENT_AMBULATORY_CARE_PROVIDER_SITE_OTHER): Payer: Medicare Other | Admitting: Hospice and Palliative Medicine

## 2020-09-20 ENCOUNTER — Encounter: Payer: Self-pay | Admitting: Internal Medicine

## 2020-09-20 VITALS — BP 148/96 | HR 79 | Temp 97.3°F | Resp 16 | Ht 64.0 in | Wt 206.0 lb

## 2020-09-20 DIAGNOSIS — Z7189 Other specified counseling: Secondary | ICD-10-CM | POA: Diagnosis not present

## 2020-09-20 DIAGNOSIS — J301 Allergic rhinitis due to pollen: Secondary | ICD-10-CM

## 2020-09-20 DIAGNOSIS — G4733 Obstructive sleep apnea (adult) (pediatric): Secondary | ICD-10-CM | POA: Diagnosis not present

## 2020-09-20 DIAGNOSIS — I6523 Occlusion and stenosis of bilateral carotid arteries: Secondary | ICD-10-CM

## 2020-09-20 DIAGNOSIS — Z9989 Dependence on other enabling machines and devices: Secondary | ICD-10-CM

## 2020-09-20 NOTE — Progress Notes (Signed)
[facility] [place], [place] [address]  Pulmonary Sleep Medicine   Office Visit Note  Patient Name: Meagan [name] DOB: [birth year] MRN [id number]  Date of Service: 09/20/2020  Complaints/HPI: Patient is here for routine pulmonary follow-up Followed for OSA on CPAP therapy-reports nightly compliance with CPAP No issues with CPAP-denies headaches, disrupted sleep, congestion or dryness Wakes up feeling refreshed, sleeps better with CPAP Recent download 96% compliance, AHI 1.3 Continues to recover well after her CVA last year Allergies continue to be well controlled, not causing symptoms at this time, no recent flares  ROS  General: (-) fever, (-) chills, (-) night sweats, (-) weakness Skin: (-) rashes, (-) itching,. Eyes: (-) visual changes, (-) redness, (-) itching. Nose and Sinuses: (-) nasal stuffiness or itchiness, (-) postnasal drip, (-) nosebleeds, (-) sinus trouble. Mouth and Throat: (-) sore throat, (-) hoarseness. Neck: (-) swollen glands, (-) enlarged thyroid, (-) neck pain. Respiratory: - cough, (-) bloody sputum, - shortness of breath, - wheezing. Cardiovascular: - ankle swelling, (-) chest pain. Lymphatic: (-) lymph node enlargement. Neurologic: (-) numbness, (-) tingling. Psychiatric: (-) anxiety, (-) depression   Current Medication: Outpatient Encounter Medications as of 09/20/2020  Medication Sig Note  . amLODipine (NORVASC) 5 MG tablet TAKE 1 TABLET BY MOUTH EVERY DAY   . atorvastatin (LIPITOR) 80 MG tablet TAKE 1 TABLET (80 MG TOTAL) BY MOUTH DAILY AT 6 PM.   . BD PEN NEEDLE NANO 2ND GEN 32G X 4 MM MISC USE AS DIRECTED DAILY   . blood glucose meter kit and supplies Dispense based on patient and insurance preference. Use up to four times daily as directed. (FOR ICD-10 E10.9, E11.9).   . carvedilol (COREG) 6.25 MG tablet Take 1 tablet (6.25 mg total) by mouth 2 (two) times daily with a meal.   . clopidogrel (PLAVIX) 75 MG tablet TAKE  1 TABLET BY MOUTH EVERY DAY   . docusate sodium (COLACE) 100 MG capsule Take 1 capsule (100 mg total) by mouth daily as needed for mild constipation.   Marland Kitchen glucose blood (ONETOUCH ULTRA) test strip USE UP TO 4 TIMES DAILY AS DIRECTED   . hydrALAZINE (APRESOLINE) 10 MG tablet Take 1 tablet (10 mg total) by mouth 3 (three) times daily as needed. If bp goes above 150/90   . Insulin Detemir (LEVEMIR FLEXTOUCH) 100 UNIT/ML Pen Inject 25-50 Units into the skin daily. (Patient taking differently: Inject 17 Units into the skin daily after breakfast.) 10/04/2019: Patient said her MD wants this tapered as follows: decrease by 2 units every three days, beginning on 09/23/2019  . levocetirizine (XYZAL) 5 MG tablet Take 5 mg by mouth daily as needed for allergies.    Marland Kitchen olmesartan-hydrochlorothiazide (BENICAR HCT) 40-25 MG tablet Take 1 tablet by mouth daily.   . polyethylene glycol (MIRALAX / GLYCOLAX) 17 g packet Take 17 g by mouth daily.   . Semaglutide (RYBELSUS) 14 MG TABS Take 1 tablet by mouth daily. Double your current dose until out before filling this rx    No facility-administered encounter medications on file as of 09/20/2020.    Surgical History: Past Surgical History:  Procedure Laterality Date  . APPENDECTOMY  2000s  . BIOPSY THYROID  2006   [facility]  . BREAST EXCISIONAL BIOPSY Left 2000s   neg  . CARPAL TUNNEL RELEASE Left March 2015  . COLONOSCOPY  2011   DR.WOHL  . COLONOSCOPY WITH PROPOFOL N/A 07/11/2017   Procedure: COLONOSCOPY WITH PROPOFOL;  Surgeon: Jonathon Bellows, MD;  Location: [facility] ENDOSCOPY;  Service: Gastroenterology;  Laterality: N/A;  . ganglion cyst removal     . KNEE SURGERY Left 08/2009  . KNEE SURGERY Right 04/07/2010  . REFRACTIVE SURGERY Bilateral   . ROTATOR CUFF REPAIR Right 2008  . TUBAL LIGATION      Medical History: Past Medical History:  Diagnosis Date  . Cellulitis    left leg  . 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  Diffuse cystic mastopathy   . Fall 2013  . Hypertension 2004  . Occlusion and stenosis of carotid artery without mention of cerebral infarction   . Sleep apnea   . Stroke [facility])     Family History: Family History  Problem Relation Age of Onset  . Breast cancer Maternal Aunt   . Hypertension Mother   . Kidney Stones Mother   . Hypertension Father   . Congestive Heart Failure Father   . Hypertension Paternal Grandfather   . Diabetes Sister   . Vision loss Sister   . Varicose Veins Sister   . Hypertension Sister   . Breast cancer Sister 29       Double Mastectomy  . Hypertension Sister     Social History: Social History   Socioeconomic History  . Marital status: Widowed    Spouse name: Not on file  . Number of children: 4  . Years of education: Not on file  . Highest education level: Master's degree (e.g., MA, MS, MEng, MEd, MSW, MBA)  Occupational History  . Occupation: Higher education careers adviser   Tobacco Use  . Smoking status: Never Smoker  . Smokeless tobacco: Never Used  Vaping Use  . Vaping Use: Never used  Substance and Sexual Activity  . Alcohol use: No  . Drug use: No  . Sexual activity: Not Currently    Partners: Male    Comment: Widow for 14 years  Other Topics Concern  . Not on file  Social History Narrative   She had a stroke on 06/2019, she has left peripheral vision loss since stroke   She moved in with her youngest sister    Social Determinants of Radio broadcast assistant Strain: Not on file  Food Insecurity: Not on file  Transportation Needs: Not on file  Physical Activity: Not on file  Stress: Not on file  Social Connections: Not on file  Intimate Partner Violence: Not on file    Vital Signs: Blood pressure (!) 148/96, pulse 79, temperature (!) 97.3 F (36.3 C), resp. rate 16, height '5\' 4"'  (1.626 m), weight 206 lb (93.4 kg), SpO2 98 %.  Examination: General Appearance: The patient is well-developed, well-nourished, and in no distress. Skin:  Gross inspection of skin unremarkable. Head: normocephalic, no gross deformities. Eyes: no gross deformities noted. ENT: ears appear grossly normal no exudates. Neck: Supple. No thyromegaly. No LAD. Respiratory: Clear throughout, no rhonchi, wheezing, or rales noted. Cardiovascular: Normal S1 and S2 without murmur or rub. Extremities: No cyanosis. pulses are equal. Neurologic: Alert and oriented. No involuntary movements.  LABS: Recent Results (from the past 2160 hour(s))  HM DIABETES EYE EXAM     Status: Abnormal   Collection Time: 06/30/20 12:00 AM  Result Value Ref Range   HM Diabetic Eye Exam Retinopathy (A) No Retinopathy    Comment: Dr. Matilde Sprang  POCT HgB A1C     Status: Abnormal   Collection Time: 07/01/20  2:56 PM  Result Value Ref Range   Hemoglobin A1C 7.5 (A)  4.0 - 5.6 %   HbA1c POC (<> result, manual entry)     HbA1c, POC (prediabetic range)     HbA1c, POC (controlled diabetic range)      Radiology: DG Bone Density  Result Date: 03/23/2020 EXAM: DUAL X-RAY ABSORPTIOMETRY (DXA) FOR BONE MINERAL DENSITY IMPRESSION: Your patient Brandyce Dimario completed a BMD test on 03/23/2020 using the [employer] (software version: 14.10) manufactured by [employer]. The following summarizes the results of our evaluation. Technologist:MTB PATIENT BIOGRAPHICAL: Name: Ianna, Salmela Patient ID: [id number] Birth Date: 16-Jul-1951 Height: 64.0 in. Gender: Female Exam Date: 03/23/2020 Weight: 201.0 lbs. Indications: Diabetic, Postmenopausal Fractures: Treatments: DENSITOMETRY RESULTS: Site         Region     Measured Date Measured Age WHO Classification Young Adult T-score BMD         %Change vs. Previous Significant Change (*) AP Spine L1-L4 03/23/2020 66.8 Normal -0.4 1.150 g/cm2 - - DualFemur Neck Right 03/23/2020 66.8 Normal -0.9 0.914 g/cm2 - - DualFemur Total Mean 03/23/2020 66.8 Normal -0.5 0.946 g/cm2 - - Left Forearm Radius 33% 03/23/2020 66.8 Normal 0.7 0.937 g/cm2 - -  ASSESSMENT: The BMD measured at Femur Neck Right is 0.914 g/cm2 with a T-score of -0.9. This patient is considered normal according to [facility]) criteria. The scan quality is good. World [facility]) criteria for post-menopausal, Caucasian Women: Normal:                   T-score at or above -1 SD Osteopenia/low bone mass: T-score between -1 and -2.5 SD Osteoporosis:             T-score at or below -2.5 SD RECOMMENDATIONS: 1. All patients should optimize calcium and vitamin D intake. 2. Consider FDA-approved medical therapies in postmenopausal women and men aged 4 years and older, based on the following: a. A hip or vertebral(clinical or morphometric) fracture b. T-score < -2.5 at the femoral neck or spine after appropriate evaluation to exclude secondary causes c. Low bone mass (T-score between -1.0 and -2.5 at the femoral neck or spine) and a 10-year probability of a hip fracture > 3% or a 10-year probability of a major osteoporosis-related fracture > 20% based on the [place]-adapted WHO algorithm 3. Clinician judgment and/or patient preferences may indicate treatment for people with 10-year fracture probabilities above or below these levels FOLLOW-UP: People with diagnosed cases of osteoporosis or at high risk for fracture should have regular bone mineral density tests. For patients eligible for Medicare, routine testing is allowed once every 2 years. The testing frequency can be increased to one year for patients who have rapidly progressing disease, those who are receiving or discontinuing medical therapy to restore bone mass, or have additional risk factors. I have reviewed this report, and agree with the above findings. [facility] Electronically Signed   By: Rolm Baptise M.D.   On: 03/23/2020 19:58   MM 3D SCREEN BREAST BILATERAL  Result Date: 03/25/2020 CLINICAL DATA:  Screening. EXAM: DIGITAL SCREENING BILATERAL MAMMOGRAM WITH TOMO AND CAD COMPARISON:   Previous exam(s). ACR Breast Density Category c: The breast tissue is heterogeneously dense, which may obscure small masses. FINDINGS: There are no findings suspicious for malignancy. Images were processed with CAD. IMPRESSION: No mammographic evidence of malignancy. A result letter of this screening mammogram will be mailed directly to the patient. RECOMMENDATION: Screening mammogram in one year. (Code:SM-B-01Y) BI-RADS CATEGORY  1: Negative. Electronically Signed  By: Ammie Ferrier M.D.   On: 03/25/2020 09:10    No results found.  No results found.    Assessment and Plan: Patient Active Problem List   Diagnosis Date Noted  . History of CVA with residual deficit 07/01/2019  . Leukocytosis 07/01/2019  . Polycythemia 07/01/2019  . Morbid obesity ([place]) 10/22/2018  . Non compliance with medical treatment 03/26/2018  . Dyslipidemia associated with type 2 diabetes mellitus ([place]) 12/04/2016  . Uncontrolled type 2 diabetes mellitus with microalbuminuria, without long-term current use of insulin ([place]) 12/04/2016  . Seasonal allergies 02/01/2016  . OSA (obstructive sleep apnea) 10/06/2015  . Hypertensive retinopathy 10/06/2015  . Thyroid cyst 10/06/2015  . Dyslipidemia 10/06/2015  . Type 2 diabetes, uncontrolled, with neuropathy ([place]) 10/06/2015  . History of pneumonia 10/06/2015  . Chronic neck pain 10/06/2015  . Carotid stenosis   . Hypertension, benign     1. OSA on CPAP Continue with nightly CPAP use  2. CPAP use counseling Discussed importance of adequate CPAP use as well as proper care and cleaning techniques of machine and all supplies.  3. Seasonal allergic rhinitis due to pollen Stable and well controlled, continue to monitor  General Counseling: I have discussed the findings of the evaluation and examination with [facility].  I have also discussed any further diagnostic evaluation thatmay be needed or ordered today. Baljit verbalizes understanding of the findings of todays  visit. We also reviewed her medications today and discussed drug interactions and side effects including but not limited excessive drowsiness and altered mental states. We also discussed that there is always a risk not just to her but also people around her. she has been encouraged to call the office with any questions or concerns that should arise related to todays visit.     Time spent: 30  I have personally obtained a history, examined the patient, evaluated laboratory and imaging results, formulated the assessment and plan and placed orders. This patient was seen by Casey Burkitt AGNP-C in Collaboration with Dr. Devona Konig as a part of collaborative care agreement.    Allyne Gee, MD [facility] Pulmonary and Critical Care Sleep medicine

## 2020-09-23 ENCOUNTER — Encounter: Payer: Self-pay | Admitting: Hospice and Palliative Medicine

## 2020-09-23 NOTE — Patient Instructions (Signed)

## 2020-10-11 ENCOUNTER — Telehealth: Payer: Self-pay | Admitting: Family Medicine

## 2020-10-11 ENCOUNTER — Other Ambulatory Visit: Payer: Self-pay | Admitting: Family Medicine

## 2020-10-11 DIAGNOSIS — IMO0002 Reserved for concepts with insufficient information to code with codable children: Secondary | ICD-10-CM

## 2020-10-11 DIAGNOSIS — E1129 Type 2 diabetes mellitus with other diabetic kidney complication: Secondary | ICD-10-CM

## 2020-10-11 NOTE — Telephone Encounter (Signed)
Requested medication (s) are due for refill today:   Not sure  Requested medication (s) are on the active medication list:   Yes  Future visit scheduled:   Yes in 3 wks   Last ordered: 07/01/2020 #90, 0 refills  Returned because no protocol assigned to the medication.   Requested Prescriptions  Pending Prescriptions Disp Refills   RYBELSUS 14 MG TABS [Pharmacy Med Name: RYBELSUS 14 MG TABLET] 90 tablet 0    Sig: TAKE 1 TABLET BY MOUTH DAILY-DOUBLE YOUR CURRENT DOSE UNTIL OUT BEFORE FILLING THIS RX      Off-Protocol Failed - 10/11/2020  1:43 PM      Failed - Medication not assigned to a protocol, review manually.      Passed - Valid encounter within last 12 months    Recent Outpatient Visits           1 month ago Uncontrolled type 2 diabetes mellitus with microalbuminuria, without long-term current use of insulin [facility])   [facility], Danna Hefty, MD   3 months ago Uncontrolled type 2 diabetes mellitus with microalbuminuria, without long-term current use of insulin [facility])   [facility] [place], Danna Hefty, MD   7 months ago Dyslipidemia associated with type 2 diabetes mellitus [facility])   [facility], Danna Hefty, MD   9 months ago Ingrown nail of great toe of left foot   [facility] [place], Danna Hefty, MD   10 months ago Dyslipidemia associated with type 2 diabetes mellitus [facility])   [facility] Alba Cory, MD       Future Appointments             In 3 weeks Carlynn Purl, Danna Hefty, MD [facility]

## 2020-10-11 NOTE — Telephone Encounter (Signed)
Copied from CRM [id number]. Topic: Quick Communication - Rx Refill/Question >> Oct 11, 2020  1:52 PM Gaetana Michaelis A wrote: Medication: Semaglutide (RYBELSUS) - has no tablets left Has the patient contacted their pharmacy? Yes - pharmacy advised patient to contact PCP [id number] [place], [place] - [address] Phone: [phone]  Agent: Please be advised that RX refills may take up to 3 business days. We ask that you follow-up with your pharmacy.

## 2020-11-02 ENCOUNTER — Other Ambulatory Visit: Payer: Self-pay | Admitting: Family Medicine

## 2020-11-02 DIAGNOSIS — I10 Essential (primary) hypertension: Secondary | ICD-10-CM

## 2020-11-02 NOTE — Progress Notes (Signed)
Name: Meagan [name]   MRN: [id number]    DOB: May 26, 1951   Date:11/04/2020       Progress Note  Subjective  Chief Complaint  Follow up   HPI   DMII: she states since she stopped Ozempic she has been able to eat again, her weigh is gradually going up.  She is currently taking Rybelsus 14 mg , A1C today is 6.9 %. She states glucose high  since steroid injection on her left shoulder mid January She has not been compliant with her diet lately, reminded her of a diabetic diet   HTN: bp at home has been under control at home, only once went to 150/90. No headaches, dizziness, chest pain or palpitation. She is on Benicar hctz 40/25, carvedilol BID and Norvasc, hydralazine is only prn   S/p GQQ:PYPP of CVA was 07/01/2019, admitted from 07/01/2019 until 07/08/2019 and had inpatient rehab until 07/17/2019. She developed right occipital headaches and bp was spiking . She went to eye doctor and bp was over 200's , he called me and we advised to send her to [facility], she [facility] with her daughter. CTA showed 45 % stenosis proximal right internal carotid artery , 60 % stenosis right external carotid artery , left distal common external carotid was 30 % , echo showed normal EF, LDL was very high at 199 , she is now on Atorvastatin and  Plavix 75 mg . She gained some of her vision back, but still has some peripheral vision loss on both side, but worse on left side. She lives with her sister and needs assistance cooking, cannot drive   OSA: seen by Dr. Humphrey Rolls andhad sleep study, wearing cpap every night, tolerating it well She has been compliant   Morbid obesity: she has a BMI above 35 and multiple co-morbidities, reminded her to resume a diabetic diet   Left shoulder pain: she had PT but was still having pain, saw Ortho and had steroid injection on left shoulder mid January, she is going back for PT soon   Patient Active Problem List   Diagnosis Date Noted  . History of CVA with residual deficit  07/01/2019  . Leukocytosis 07/01/2019  . Polycythemia 07/01/2019  . Morbid obesity ([place]) 10/22/2018  . Non compliance with medical treatment 03/26/2018  . Dyslipidemia associated with type 2 diabetes mellitus ([place]) 12/04/2016  . Uncontrolled type 2 diabetes mellitus with microalbuminuria, without long-term current use of insulin ([place]) 12/04/2016  . Seasonal allergies 02/01/2016  . OSA (obstructive sleep apnea) 10/06/2015  . Hypertensive retinopathy 10/06/2015  . Thyroid cyst 10/06/2015  . Dyslipidemia 10/06/2015  . Type 2 diabetes, uncontrolled, with neuropathy ([place]) 10/06/2015  . History of pneumonia 10/06/2015  . Chronic neck pain 10/06/2015  . Carotid stenosis   . Hypertension, benign     Past Surgical History:  Procedure Laterality Date  . APPENDECTOMY  2000s  . BIOPSY THYROID  2006   [facility]  . BREAST EXCISIONAL BIOPSY Left 2000s   neg  . CARPAL TUNNEL RELEASE Left March 2015  . COLONOSCOPY  2011   DR.WOHL  . COLONOSCOPY WITH PROPOFOL N/A 07/11/2017   Procedure: COLONOSCOPY WITH PROPOFOL;  Surgeon: Jonathon Bellows, MD;  Location: [facility] ENDOSCOPY;  Service: Gastroenterology;  Laterality: N/A;  . ganglion cyst removal     . KNEE SURGERY Left 08/2009  . KNEE SURGERY Right 04/07/2010  . REFRACTIVE SURGERY Bilateral   . ROTATOR CUFF REPAIR Right 2008  . TUBAL LIGATION  Family History  Problem Relation Age of Onset  . Breast cancer Maternal Aunt   . Hypertension Mother   . Kidney Stones Mother   . Hypertension Father   . Congestive Heart Failure Father   . Hypertension Paternal Grandfather   . Diabetes Sister   . Vision loss Sister   . Varicose Veins Sister   . Hypertension Sister   . Breast cancer Sister 9       Double Mastectomy  . Hypertension Sister     Social History   Tobacco Use  . Smoking status: Never Smoker  . Smokeless tobacco: Never Used  Substance Use Topics  . Alcohol use: No     Current Outpatient Medications:  .  atorvastatin (LIPITOR) 80  MG tablet, TAKE 1 TABLET (80 MG TOTAL) BY MOUTH DAILY AT 6 PM., Disp: 90 tablet, Rfl: 3 .  BD PEN NEEDLE NANO 2ND GEN 32G X 4 MM MISC, USE AS DIRECTED DAILY, Disp: 100 each, Rfl: 11 .  blood glucose meter kit and supplies, Dispense based on patient and insurance preference. Use up to four times daily as directed. (FOR ICD-10 E10.9, E11.9)., Disp: 1 each, Rfl: 0 .  docusate sodium (COLACE) 100 MG capsule, Take 1 capsule (100 mg total) by mouth daily as needed for mild constipation., Disp: 60 capsule, Rfl: 0 .  glucose blood (ONETOUCH ULTRA) test strip, USE UP TO 4 TIMES DAILY AS DIRECTED, Disp: 200 strip, Rfl: 2 .  hydrALAZINE (APRESOLINE) 10 MG tablet, Take 1 tablet (10 mg total) by mouth 3 (three) times daily as needed. If bp goes above 150/90, Disp: 30 tablet, Rfl: 0 .  levocetirizine (XYZAL) 5 MG tablet, Take 5 mg by mouth daily as needed for allergies. , Disp: , Rfl:  .  polyethylene glycol (MIRALAX / GLYCOLAX) 17 g packet, Take 17 g by mouth daily., Disp: 14 each, Rfl: 0 .  SENNA-PLUS 8.6-50 MG tablet, Take 2 tablets by mouth at bedtime., Disp: , Rfl:  .  amLODipine (NORVASC) 5 MG tablet, Take 1 tablet (5 mg total) by mouth daily., Disp: 90 tablet, Rfl: 0 .  carvedilol (COREG) 6.25 MG tablet, Take 1 tablet (6.25 mg total) by mouth 2 (two) times daily with a meal., Disp: 180 tablet, Rfl: 0 .  clopidogrel (PLAVIX) 75 MG tablet, Take 1 tablet (75 mg total) by mouth daily., Disp: 90 tablet, Rfl: 1 .  insulin detemir (LEVEMIR FLEXTOUCH) 100 UNIT/ML FlexPen, Inject 24-50 Units into the skin daily after breakfast., Disp: 45 mL, Rfl: 1 .  olmesartan-hydrochlorothiazide (BENICAR HCT) 40-25 MG tablet, Take 1 tablet by mouth daily., Disp: 90 tablet, Rfl: 1 .  Semaglutide (RYBELSUS) 14 MG TABS, Take 1 tablet by mouth daily., Disp: 90 tablet, Rfl: 0  Allergies  Allergen Reactions  . Bee Venom Swelling and Other (See Comments)    Immediately takes Benadryl to counteract  . Wilder Glade [Dapagliflozin] Rash     Pt reports that she developed a rash when she took [place] and she would like it to be added as an allergy  . Keflex [Cephalexin] Rash    I personally reviewed active problem list, medication list, allergies, family history, social history, health maintenance with the patient/caregiver today.   ROS  Constitutional: Negative for fever, positive for  weight change.  Respiratory: Negative for cough and shortness of breath.   Cardiovascular: Negative for chest pain or palpitations.  Gastrointestinal: Negative for abdominal pain, no bowel changes.  Musculoskeletal: positive  for gait problem - due to  vision problems but no joint swelling.  Skin: Negative for rash.  Neurological: Negative for dizziness or headache.  No other specific complaints in a complete review of systems (except as listed in HPI above).  Objective  Vitals:   11/04/20 1358  BP: 138/86  Pulse: 84  Resp: 16  Temp: 98 F (36.7 C)  TempSrc: Oral  SpO2: 98%  Weight: 209 lb 3.2 oz (94.9 kg)  Height: '5\' 4"'  (1.626 m)    Body mass index is 35.91 kg/m.  Physical Exam  Constitutional: Patient appears well-developed and well-nourished. Obese  No distress.  HEENT: head atraumatic, normocephalic, pupils equal and reactive to light,  neck supple Cardiovascular: Normal rate, regular rhythm and normal heart sounds.  No murmur heard. No BLE edema. Pulmonary/Chest: Effort normal and breath sounds normal. No respiratory distress. Abdominal: Soft.  There is no tenderness. Psychiatric: Patient has a normal mood and affect. behavior is normal. Judgment and thought content normal.  Recent Results (from the past 2160 hour(s))  POCT HgB A1C     Status: Abnormal   Collection Time: 11/04/20  2:15 PM  Result Value Ref Range   Hemoglobin A1C 6.9 (A) 4.0 - 5.6 %   HbA1c POC (<> result, manual entry)     HbA1c, POC (prediabetic range)     HbA1c, POC (controlled diabetic range)       PHQ2/9: Depression screen [facility] 2/9 11/04/2020  08/29/2020 07/01/2020 03/01/2020 01/15/2020  Decreased Interest 0 0 0 0 0  Down, Depressed, Hopeless 0 0 0 0 0  PHQ - 2 Score 0 0 0 0 0  Altered sleeping - - - 0 0  Tired, decreased energy - - - 0 0  Change in appetite - - - 0 0  Feeling bad or failure about yourself  - - - 0 0  Trouble concentrating - - - 0 0  Moving slowly or fidgety/restless - - - 0 0  Suicidal thoughts - - - 0 0  PHQ-9 Score - - - 0 0  Difficult doing work/chores - - - Not difficult at all -  Some recent data might be hidden    phq 9 is negative  Fall Risk: Fall Risk  11/04/2020 08/29/2020 07/01/2020 03/01/2020 01/15/2020  Falls in the past year? 0 0 0 0 0  Number falls in past yr: 0 0 0 0 0  Injury with Fall? 0 0 0 0 0  Risk for fall due to : - Impaired balance/gait - - -  Follow up - - - Falls evaluation completed -     Functional Status Survey: Is the patient deaf or have difficulty hearing?: No Does the patient have difficulty seeing, even when wearing glasses/contacts?: Yes Does the patient have difficulty concentrating, remembering, or making decisions?: No Does the patient have difficulty walking or climbing stairs?: Yes Does the patient have difficulty dressing or bathing?: No Does the patient have difficulty doing errands alone such as visiting a doctor's office or shopping?: Yes   Assessment & Plan  1. DM II with microalbuminuria ( HCC )   - POCT HgB A1C - Semaglutide (RYBELSUS) 14 MG TABS; Take 1 tablet by mouth daily.  Dispense: 90 tablet; Refill: 0 - insulin detemir (LEVEMIR FLEXTOUCH) 100 UNIT/ML FlexPen; Inject 24-50 Units into the skin daily after breakfast.  Dispense: 45 mL; Refill: 1  2. Dyslipidemia associated with type 2 diabetes mellitus (HCC)  - insulin detemir (LEVEMIR FLEXTOUCH) 100 UNIT/ML FlexPen; Inject 24-50 Units into the skin daily after  breakfast.  Dispense: 45 mL; Refill: 1 - Lipid panel - Microalbumin / creatinine urine ratio  3. OSA (obstructive sleep apnea)   4.  History of CVA with residual deficit  - clopidogrel (PLAVIX) 75 MG tablet; Take 1 tablet (75 mg total) by mouth daily.  Dispense: 90 tablet; Refill: 1  5. Morbid obesity ([place])  Discussed with the patient the risk posed by an increased BMI. Discussed importance of portion control, calorie counting and at least 150 minutes of physical activity weekly. Avoid sweet beverages and drink more water. Eat at least 6 servings of fruit and vegetables daily   6. Hypertension, benign  - olmesartan-hydrochlorothiazide (BENICAR HCT) 40-25 MG tablet; Take 1 tablet by mouth daily.  Dispense: 90 tablet; Refill: 1 - CBC with Differential/Platelet - COMPLETE METABOLIC PANEL WITH GFR  7. Carotid stenosis, bilateral   8. Hypertension associated with diabetes ([place])  - carvedilol (COREG) 6.25 MG tablet; Take 1 tablet (6.25 mg total) by mouth 2 (two) times daily with a meal.  Dispense: 180 tablet; Refill: 0

## 2020-11-04 ENCOUNTER — Encounter: Payer: Self-pay | Admitting: Family Medicine

## 2020-11-04 ENCOUNTER — Other Ambulatory Visit: Payer: Self-pay

## 2020-11-04 ENCOUNTER — Ambulatory Visit (INDEPENDENT_AMBULATORY_CARE_PROVIDER_SITE_OTHER): Payer: Medicare Other | Admitting: Family Medicine

## 2020-11-04 VITALS — BP 138/86 | HR 84 | Temp 98.0°F | Resp 16 | Ht 64.0 in | Wt 209.2 lb

## 2020-11-04 DIAGNOSIS — I6523 Occlusion and stenosis of bilateral carotid arteries: Secondary | ICD-10-CM | POA: Diagnosis not present

## 2020-11-04 DIAGNOSIS — R809 Proteinuria, unspecified: Secondary | ICD-10-CM | POA: Diagnosis not present

## 2020-11-04 DIAGNOSIS — I6930 Unspecified sequelae of cerebral infarction: Secondary | ICD-10-CM | POA: Diagnosis not present

## 2020-11-04 DIAGNOSIS — E1129 Type 2 diabetes mellitus with other diabetic kidney complication: Secondary | ICD-10-CM | POA: Diagnosis not present

## 2020-11-04 DIAGNOSIS — E1169 Type 2 diabetes mellitus with other specified complication: Secondary | ICD-10-CM

## 2020-11-04 DIAGNOSIS — Z794 Long term (current) use of insulin: Secondary | ICD-10-CM

## 2020-11-04 DIAGNOSIS — G4733 Obstructive sleep apnea (adult) (pediatric): Secondary | ICD-10-CM

## 2020-11-04 DIAGNOSIS — I10 Essential (primary) hypertension: Secondary | ICD-10-CM

## 2020-11-04 DIAGNOSIS — E1159 Type 2 diabetes mellitus with other circulatory complications: Secondary | ICD-10-CM

## 2020-11-04 DIAGNOSIS — I152 Hypertension secondary to endocrine disorders: Secondary | ICD-10-CM

## 2020-11-04 DIAGNOSIS — E785 Hyperlipidemia, unspecified: Secondary | ICD-10-CM

## 2020-11-04 LAB — POCT GLYCOSYLATED HEMOGLOBIN (HGB A1C): Hemoglobin A1C: 6.9 % — AB (ref 4.0–5.6)

## 2020-11-04 MED ORDER — RYBELSUS 14 MG PO TABS: 1.0000 | ORAL_TABLET | Freq: Every day | ORAL | 0 refills | Status: DC

## 2020-11-04 MED ORDER — LEVEMIR FLEXTOUCH 100 UNIT/ML ~~LOC~~ SOPN: 24.0000 [IU] | PEN_INJECTOR | Freq: Every day | SUBCUTANEOUS | 1 refills | Status: DC

## 2020-11-04 MED ORDER — CLOPIDOGREL BISULFATE 75 MG PO TABS: 75.0000 mg | ORAL_TABLET | Freq: Every day | ORAL | 1 refills | Status: DC

## 2020-11-04 MED ORDER — OLMESARTAN MEDOXOMIL-HCTZ 40-25 MG PO TABS: 1.0000 | ORAL_TABLET | Freq: Every day | ORAL | 1 refills | Status: DC

## 2020-11-04 MED ORDER — AMLODIPINE BESYLATE 5 MG PO TABS: 5.0000 mg | ORAL_TABLET | Freq: Every day | ORAL | 0 refills | Status: DC

## 2020-11-04 MED ORDER — CARVEDILOL 6.25 MG PO TABS: 6.2500 mg | ORAL_TABLET | Freq: Two times a day (BID) | ORAL | 0 refills | Status: DC

## 2020-11-05 LAB — COMPLETE METABOLIC PANEL WITH GFR
AG Ratio: 1.2 (calc) (ref 1.0–2.5)
ALT: 19 U/L (ref 6–29)
AST: 17 U/L (ref 10–35)
Albumin: 3.4 g/dL — ABNORMAL LOW (ref 3.6–5.1)
Alkaline phosphatase (APISO): 104 U/L (ref 37–153)
BUN/Creatinine Ratio: 17 (calc) (ref 6–22)
BUN: 18 mg/dL (ref 7–25)
CO2: 32 mmol/L (ref 20–32)
Calcium: 9.1 mg/dL (ref 8.6–10.4)
Chloride: 101 mmol/L (ref 98–110)
Creat: 1.07 mg/dL — ABNORMAL HIGH (ref 0.50–0.99)
GFR, Est African American: 62 mL/min/{1.73_m2} (ref >=60)
GFR, Est Non African American: 54 mL/min/{1.73_m2} — ABNORMAL LOW (ref >=60)
Globulin: 2.9 g/dL (calc) (ref 1.9–3.7)
Glucose, Bld: 129 mg/dL — ABNORMAL HIGH (ref 65–99)
Potassium: 3.8 mmol/L (ref 3.5–5.3)
Sodium: 139 mmol/L (ref 135–146)
Total Bilirubin: 0.5 mg/dL (ref 0.2–1.2)
Total Protein: 6.3 g/dL (ref 6.1–8.1)

## 2020-11-05 LAB — MICROALBUMIN / CREATININE URINE RATIO
Creatinine, Urine: 55 mg/dL (ref 20–275)
Microalb Creat Ratio: 1496 mcg/mg creat — ABNORMAL HIGH (ref <30)
Microalb, Ur: 82.3 mg/dL

## 2020-11-05 LAB — CBC WITH DIFFERENTIAL/PLATELET
Absolute Monocytes: 806 cells/uL (ref 200–950)
Basophils Absolute: 65 cells/uL (ref 0–200)
Basophils Relative: 0.5 %
Eosinophils Absolute: 208 cells/uL (ref 15–500)
Eosinophils Relative: 1.6 %
HCT: 32.1 % — ABNORMAL LOW (ref 35.0–45.0)
Hemoglobin: 11.2 g/dL — ABNORMAL LOW (ref 11.7–15.5)
Lymphs Abs: 2574 cells/uL (ref 850–3900)
MCH: 30.9 pg (ref 27.0–33.0)
MCHC: 34.9 g/dL (ref 32.0–36.0)
MCV: 88.7 fL (ref 80.0–100.0)
MPV: 10.3 fL (ref 7.5–12.5)
Monocytes Relative: 6.2 %
Neutro Abs: 9347 cells/uL — ABNORMAL HIGH (ref 1500–7800)
Neutrophils Relative %: 71.9 %
Platelets: 264 10*3/uL (ref 140–400)
RBC: 3.62 10*6/uL — ABNORMAL LOW (ref 3.80–5.10)
RDW: 12.7 % (ref 11.0–15.0)
Total Lymphocyte: 19.8 %
WBC: 13 10*3/uL — ABNORMAL HIGH (ref 3.8–10.8)

## 2020-11-05 LAB — LIPID PANEL
Cholesterol: 124 mg/dL (ref <200)
HDL: 40 mg/dL — ABNORMAL LOW (ref >=50)
LDL Cholesterol (Calc): 65 mg/dL (calc)
Non-HDL Cholesterol (Calc): 84 mg/dL (calc) (ref <130)
Total CHOL/HDL Ratio: 3.1 (calc) (ref <5.0)
Triglycerides: 105 mg/dL (ref <150)

## 2021-01-21 ENCOUNTER — Other Ambulatory Visit: Payer: Self-pay | Admitting: Family Medicine

## 2021-01-21 DIAGNOSIS — I6930 Unspecified sequelae of cerebral infarction: Secondary | ICD-10-CM

## 2021-01-21 NOTE — Telephone Encounter (Signed)
Requested Prescriptions  Pending Prescriptions Disp Refills  . atorvastatin (LIPITOR) 80 MG tablet [Pharmacy Med Name: ATORVASTATIN 80 MG TABLET] 90 tablet 2    Sig: TAKE 1 TABLET (80 MG TOTAL) BY MOUTH DAILY AT 6 PM.     Cardiovascular:  Antilipid - Statins Failed - 01/21/2021  9:03 AM      Failed - HDL in normal range and within 360 days    HDL  Date Value Ref Range Status  11/04/2020 40 (L) > OR = 50 mg/dL Final  01/31/2016 34 (L) >39 mg/dL Final         Passed - Total Cholesterol in normal range and within 360 days    Cholesterol, Total  Date Value Ref Range Status  01/31/2016 200 (H) 100 - 199 mg/dL Final   Cholesterol  Date Value Ref Range Status  11/04/2020 124 <200 mg/dL Final         Passed - LDL in normal range and within 360 days    LDL Cholesterol (Calc)  Date Value Ref Range Status  11/04/2020 65 mg/dL (calc) Final    Comment:    Reference range: <100 . Desirable range <100 mg/dL for primary prevention;   <70 mg/dL for patients with CHD or diabetic patients  with > or = 2 CHD risk factors. Marland Kitchen LDL-C is now calculated using the Martin-Hopkins  calculation, which is a validated novel method providing  better accuracy than the Friedewald equation in the  estimation of LDL-C.  Cresenciano Genre et al. Annamaria Helling. 1027;253(66): 2061-2068  (http://education.[employer].com/faq/FAQ164)          Passed - Triglycerides in normal range and within 360 days    Triglycerides  Date Value Ref Range Status  11/04/2020 105 <150 mg/dL Final         Passed - Patient is not pregnant      Passed - Valid encounter within last 12 months    Recent Outpatient Visits          2 months ago Type 2 diabetes mellitus with microalbuminuria, with long-term current use of insulin [facility])   [facility] [place], Drue Stager, MD   4 months ago Uncontrolled type 2 diabetes mellitus with microalbuminuria, without long-term current use of insulin [facility])   [place], Drue Stager, MD   6 months ago Uncontrolled type 2 diabetes mellitus with microalbuminuria, without long-term current use of insulin [facility])   [facility] [place], Drue Stager, MD   10 months ago Dyslipidemia associated with type 2 diabetes mellitus [facility])   [facility] Steele Sizer, MD   1 year ago Ingrown nail of great toe of left foot   [facility] Steele Sizer, MD      Future Appointments            In 1 month Ancil Boozer, Drue Stager, MD [facility], [facility]

## 2021-01-25 ENCOUNTER — Ambulatory Visit (INDEPENDENT_AMBULATORY_CARE_PROVIDER_SITE_OTHER): Payer: Medicare Other

## 2021-01-25 ENCOUNTER — Other Ambulatory Visit: Payer: Self-pay

## 2021-01-25 DIAGNOSIS — G4733 Obstructive sleep apnea (adult) (pediatric): Secondary | ICD-10-CM

## 2021-01-25 NOTE — Progress Notes (Signed)
95 percentile pressure 13.2   95th percentile leak 25.0   apnea index 1.4 /hr  apnea-hypopnea index  1.8 /hr   total days used  >4 hr 66 days  total days used <4 hr 12 days  Total compliance 73 percent  She is doing great helped her change filter and nose pillow. We also discussed using cpap during naps. No other problems or questions at this time

## 2021-02-13 ENCOUNTER — Encounter: Payer: Self-pay | Admitting: Adult Health

## 2021-02-13 ENCOUNTER — Ambulatory Visit (INDEPENDENT_AMBULATORY_CARE_PROVIDER_SITE_OTHER): Payer: Medicare Other | Admitting: Adult Health

## 2021-02-13 VITALS — BP 141/76 | HR 75 | Ht 64.0 in | Wt 217.0 lb

## 2021-02-13 DIAGNOSIS — E1165 Type 2 diabetes mellitus with hyperglycemia: Secondary | ICD-10-CM

## 2021-02-13 DIAGNOSIS — H53462 Homonymous bilateral field defects, left side: Secondary | ICD-10-CM

## 2021-02-13 DIAGNOSIS — E785 Hyperlipidemia, unspecified: Secondary | ICD-10-CM

## 2021-02-13 DIAGNOSIS — I10 Essential (primary) hypertension: Secondary | ICD-10-CM

## 2021-02-13 DIAGNOSIS — E1129 Type 2 diabetes mellitus with other diabetic kidney complication: Secondary | ICD-10-CM | POA: Diagnosis not present

## 2021-02-13 DIAGNOSIS — I63531 Cerebral infarction due to unspecified occlusion or stenosis of right posterior cerebral artery: Secondary | ICD-10-CM

## 2021-02-13 DIAGNOSIS — IMO0002 Reserved for concepts with insufficient information to code with codable children: Secondary | ICD-10-CM

## 2021-02-13 DIAGNOSIS — I6523 Occlusion and stenosis of bilateral carotid arteries: Secondary | ICD-10-CM

## 2021-02-13 DIAGNOSIS — R809 Proteinuria, unspecified: Secondary | ICD-10-CM

## 2021-02-13 NOTE — Patient Instructions (Addendum)
Overall doing very well - continue to do exercises at home as safety permits   Continue to follow with other specialities as scheduled   Continue clopidogrel 75 mg daily  and atorvastatin  for secondary stroke prevention  Continue to follow up with PCP regarding cholesterol, blood pressure and diabetes management  Maintain strict control of hypertension with blood pressure goal below 130/90, diabetes with hemoglobin A1c goal below 7% and cholesterol with LDL cholesterol (bad cholesterol) goal below 70 mg/dL.        Thank you for coming to see [place] at [facility]. I hope we have been able to provide you high quality care today.  You may receive a patient satisfaction survey over the next few weeks. We would appreciate your feedback and comments so that we may continue to improve ourselves and the health of our patients.

## 2021-02-13 NOTE — Progress Notes (Signed)
[facility] [address] [place]. [place] [address] [phone]       STROKE FOLLOW UP NOTE  [name] Date of Birth:  06-22-1951 Medical Record Number:  [id number]   Reason for Referral: stroke follow up    CHIEF COMPLAINT:  Chief Complaint  Patient presents with  . [name]    Rm 14 with sister (nellie) Pt is well and stable, no new symptoms, Still has vision complications but is still receiving treatment for it.      HPI:  Today, 02/13/2021, Meagan [name] returns for 59-monthstroke [name] accompanied by her sister  Stable from stroke standpoint without new stroke/TIA symptoms and reports residual left sided weakness, gait impairment with use of cane and visual impairment. Denies any recent falls.  Evaluated by orthopedics for continued left shoulder pain receiving injection with great improvement of ROM and minimal residual pain - completed PT for shoulder and still does exercises at home Continues to follow with retina specialist getting repeat injections - f/u scheduled 5/23 - still has not been released back to drive but per patient, has been slowly improving   Compliant on Plavix and atorvastatin without associated side effects Blood pressure today 141/76. Monitors at home and typically stable A1c 6.9 in 10/2020 -routinely monitors at home and typically 190-230s - does admit to dietary noncompliance - has f/u with PCP at the end of this month  Compliant on CPAP nightly for [name] management routinely monitored by Dr. [facility]  No further concerns at this time   History provided for reference purposes only Update 08/15/2020 JM: Meagan [name] accompanied by her sister.  Reports residual deficits of mild dysarthria, vision impairment, left sided weakness and gait impairment. 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 Denies new or worsening stroke/TIA symptoms. Has  had additional vision procedure recently for diabetic retinopathy including steroid injections as laser procedure not beneficial. Per patient, opthalmology reported slight improvement but not yet released to drive. Scheduled follow up visit scheduled in December or January. She continues to live with her sister and is since retired. Remains on plavix and atorvastatin without side effects. Blood pressure today 138/90. DM stable. No further concerns at this time.   Update 02/08/2020 JM: [name] for stroke [name] accompanied by her sister.  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  Recent ophthalmologic bilateral laser surgery for "increased pressure with vessel leakage" and denies benefit of vision improvement.  Continues to work with outpatient therapy with ongoing improvement.  Continues on Plavix and atorvastatin for secondary stroke prevention.  Blood pressure today 151/84.  She is concerned regarding recent elevated blood pressures with ongoing monitoring at home and does continue to follow with PCP for ongoing management.  DM stable.  Continues to follow with PCP regularly.  No further neurological concerns at this time.  Update 11/11/2019: Meagan [name] who is being seen today by request of her sister due to worsening appetite with weight loss. Established patient with prior history of stroke. 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  Continues on plavix and atorvastatin for secondary stroke prevention without side effects.  Blood pressure today 123/78.  30-day cardiac event monitor completed which was negative for atrial fibrillation. Since prior visit, she has had 2 ED admissions both appearing to be due to dehydration  and decreased appetite.  Continues to follow with PCP for ongoing monitoring and management with adjustment of  medications due to possible side effects. Sister is very concerned regarding ongoing decreased appetite and weight loss. Denies new or worsening stroke/TIA symptoms.   Initial visit 08/06/2019: Meagan [name] is a 70 year old [name] who is being seen today for stroke [name] accompanied by her sister.  She has since been discharged from SNF rehab and currently residing with her sister.  Residual deficits of left homonymous hemianopia, left hemiparesis and delayed recall.  She continues to participate in outpatient PT/OT in [place], [place] with ongoing improvement.  Continues to ambulate with rolling walker.  She denies improvement of her vision.  She has had evaluation by her retina specialist since hospital discharge and has [name] visit in December.  She does report worsening of vision and headaches with eyestrain.  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.  She has not returned to driving due to decreased vision.  She continues on aspirin and Plavix without bleeding or bruising.  Continues on atorvastatin without myalgias.  Blood pressure today 136/89.  She did receive 30-day cardiac event but per patient, has attempted to reach out to her cardiologist in regards to need of monitoring and has not received return phone call.  No further concerns at this time.  Stroke admission 07/01/2019: Meagan [name] who had recent eye injections  presented on 07/01/2019 with HA, difficulty looking to the L that did not resolve and L sided numbness since 9/20.  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  MRI showed acute right parieto-occipital junction infarct along with small vessel disease.  MRA head showed arthrosclerosis bilateral PCAs with focal stenosis right P1/P2 junction.  MRA neck motion degraded.  CTA head neck right ICA 45% stenosis,  right ECA 60%, left CCA 30% stenosis.  2D echo unremarkable.  Recommended 30-day cardiac event monitor outpatient to rule out atrial fibrillation. LDL 109.  A1c 10.1.  Initiated DAPT for 3 months due to intracranial stenosis and Plavix alone as previously on aspirin.  Uncontrolled DM and recommend close PCP [name].  HTN stable.  Change of statin therapy to atorvastatin 80 mg daily.  Other stroke risk factors include advanced age, obesity and [name] but no prior history of stroke.  Residual deficits of left homonymous hemianopia and recommended [name] with ophthalmology prior to return to driving.  She also had residual right hemiparesis and discharged to SNF on 07/08/2019 for ongoing therapy.      ROS:   14 system review of systems performed and negative with exception of those listed in HPI   PMH:  Past Medical History:  Diagnosis Date  . Cellulitis    left leg  . 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 Diffuse cystic mastopathy   . Fall 2013  . Hypertension 2004  . Occlusion and stenosis of carotid artery without mention of cerebral infarction   . Sleep apnea   . Stroke [facility])     PSH:  Past Surgical History:  Procedure Laterality Date  . APPENDECTOMY  2000s  . BIOPSY THYROID  2006   [facility]  . BREAST EXCISIONAL BIOPSY Left 2000s   neg  . CARPAL TUNNEL RELEASE Left March 2015  . COLONOSCOPY  2011   DR.WOHL  .  COLONOSCOPY WITH PROPOFOL N/A 07/11/2017   Procedure: COLONOSCOPY WITH PROPOFOL;  Surgeon: Jonathon Bellows, MD;  Location: [facility] ENDOSCOPY;  Service: Gastroenterology;  Laterality: N/A;  . ganglion cyst removal     . KNEE SURGERY Left 08/2009  . KNEE SURGERY Right 04/07/2010  . REFRACTIVE SURGERY Bilateral   . ROTATOR CUFF REPAIR Right 2008  . TUBAL LIGATION      Social History:  Social History   Socioeconomic History  . Marital status: Widowed    Spouse name: Not on file  . Number of children: 4  . Years of education: Not on file   . Highest education level: Master's degree (e.g., MA, MS, MEng, MEd, MSW, MBA)  Occupational History  . Occupation: Higher education careers adviser   Tobacco Use  . Smoking status: Never Smoker  . Smokeless tobacco: Never Used  Vaping Use  . Vaping Use: Never used  Substance and Sexual Activity  . Alcohol use: No  . Drug use: No  . Sexual activity: Not Currently    Partners: Male    Comment: Widow for 14 years  Other Topics Concern  . Not on file  Social History Narrative   She had a stroke on 06/2019, she has left peripheral vision loss since stroke   She moved in with her youngest sister    Social Determinants of Radio broadcast assistant Strain: Not on file  Food Insecurity: Not on file  Transportation Needs: Not on file  Physical Activity: Not on file  Stress: Not on file  Social Connections: Not on file  Intimate Partner Violence: Not on file    Family History:  Family History  Problem Relation Age of Onset  . Breast cancer Maternal Aunt   . Hypertension Mother   . Kidney Stones Mother   . Hypertension Father   . Congestive Heart Failure Father   . Hypertension Paternal Grandfather   . Diabetes Sister   . Vision loss Sister   . Varicose Veins Sister   . Hypertension Sister   . Breast cancer Sister 69       Double Mastectomy  . Hypertension Sister     Medications:   Current Outpatient Medications on File Prior to Visit  Medication Sig Dispense Refill  . amLODipine (NORVASC) 5 MG tablet Take 1 tablet (5 mg total) by mouth daily. 90 tablet 0  . atorvastatin (LIPITOR) 80 MG tablet TAKE 1 TABLET (80 MG TOTAL) BY MOUTH DAILY AT 6 PM. 90 tablet 2  . BD PEN NEEDLE NANO 2ND GEN 32G X 4 MM MISC USE AS DIRECTED DAILY 100 each 11  . blood glucose meter kit and supplies Dispense based on patient and insurance preference. Use up to four times daily as directed. (FOR ICD-10 E10.9, E11.9). 1 each 0  . carvedilol (COREG) 6.25 MG tablet Take 1 tablet (6.25 mg total) by mouth 2 (two) times  daily with a meal. 180 tablet 0  . clopidogrel (PLAVIX) 75 MG tablet Take 1 tablet (75 mg total) by mouth daily. 90 tablet 1  . docusate sodium (COLACE) 100 MG capsule Take 1 capsule (100 mg total) by mouth daily as needed for mild constipation. 60 capsule 0  . glucose blood (ONETOUCH ULTRA) test strip USE UP TO 4 TIMES DAILY AS DIRECTED 200 strip 2  . hydrALAZINE (APRESOLINE) 10 MG tablet Take 1 tablet (10 mg total) by mouth 3 (three) times daily as needed. If bp goes above 150/90 30 tablet 0  . insulin detemir (LEVEMIR FLEXTOUCH)  100 UNIT/ML FlexPen Inject 24-50 Units into the skin daily after breakfast. 45 mL 1  . levocetirizine (XYZAL) 5 MG tablet Take 5 mg by mouth daily as needed for allergies.     Marland Kitchen olmesartan-hydrochlorothiazide (BENICAR HCT) 40-25 MG tablet Take 1 tablet by mouth daily. 90 tablet 1  . polyethylene glycol (MIRALAX / GLYCOLAX) 17 g packet Take 17 g by mouth daily. 14 each 0  . Semaglutide (RYBELSUS) 14 MG TABS Take 1 tablet by mouth daily. 90 tablet 0  . SENNA-PLUS 8.6-50 MG tablet Take 2 tablets by mouth at bedtime.     No current facility-administered medications on file prior to visit.    Allergies:   Allergies  Allergen Reactions  . Amoxicillin     rash  . Bee Venom Swelling and Other (See Comments)    Immediately takes Benadryl to counteract  . Wilder Glade [Dapagliflozin] Rash    Pt reports that she developed a rash when she took [place] and she would like it to be added as an allergy  . Keflex [Cephalexin] Rash     Physical Exam  Vitals:   02/13/21 1343  BP: (!) 141/76  Pulse: 75  Weight: 217 lb (98.4 kg)  Height: '5\' 4"'  (1.626 m)   Body mass index is 37.25 kg/m. No exam data present  General: well developed, well nourished, pleasant middle-aged African-American [name], seated, in no evident distress Head: head normocephalic and atraumatic.   Neck: supple with no carotid or supraclavicular bruits Cardiovascular: regular rate and rhythm, no  murmurs Musculoskeletal: no deformity Skin:  no rash/petichiae Vascular:  Normal pulses all extremities   Neurologic Exam Mental Status: Awake and fully alert. Mild dysarthria with occasional speech hesitancy. Oriented to place and time. Recent and remote memory intact. Attention span, concentration and fund of knowledge appropriate but delayed. Mood and affect appropriate.  Cranial Nerves: Pupils equal, briskly reactive to light. Extraocular movements full without nystagmus. Visual fields left inferior homonymous quadrantanopia. Hearing intact. Facial sensation intact.  Mild left lower facial weakness.  Motor: Normal bulk and tone. Normal strength in all tested extremity muscles with great improvement of L shoulder ROM Sensory.: intact to pinprick , position and vibratory sensation with slightly decreased sensation left side.  Coordination: Rapid alternating movements normal in all extremities except slightly decreased left hand dexterity. Finger-to-nose and heel-to-shin performed with mild ataxia on left side. Gait and Station: Arises from chair without difficulty. Stance is normal. Gait demonstrates normal stride length and balance with use of cane Reflexes: 1+ and symmetric. Toes downgoing.       ASSESSMENT/PLAN: Meagan [name] is a 70 y.o. 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 Vascular risk factors include HTN, HLD, DM, [name] and intercranial stenosis.       1. Right PCA stroke:  a. Residual deficits: mild Dysarthria, L sided ataxia, gait impairment and left peripheral vision impairment.  Since completed therapies and encouraged continued exercises at home.  Being followed by orthopedics for adhesive capsulitis with great benefit after recent injection b. Continue clopidogrel 75 mg daily  and atorvastatin for secondary stroke prevention.    c. Discussed secondary stroke prevention measures and importance of close PCP [name] for aggressive stroke risk factor management 2. HTN: BP goal <130/90.  Stable on hydralazine, Benicar and amlodipine per PCP 3. HLD: LDL goal<70.  Lipid panel 10/2020 LDL 65 on atorvastatin 80 mg daily  Per PCP 4. DMII: A1c goal<7.  A1c 6.9 10/2020 down from 7.5 on Levemir per PCP. Continues to follow with ophthalmology for diabetic retinopathy.  5. Carotid stenosis: followed by [facility] Dr. Delana Meyer - f/u visit next month   Overall stable from stroke standpoint and advised to [name] on an as-needed basis which patient and sister agreed with   CC:  [facility] provider: Dr. Midge Minium, Drue Stager, MD     Frann Rider, [facility]  [facility] [address] [place], [place] [address]  Phone [phone] Fax [phone] Note: This document was prepared with digital dictation and possible smart phrase technology. Any transcriptional errors that result from this process are unintentional.

## 2021-02-16 ENCOUNTER — Other Ambulatory Visit: Payer: Self-pay | Admitting: Family Medicine

## 2021-02-16 DIAGNOSIS — E1159 Type 2 diabetes mellitus with other circulatory complications: Secondary | ICD-10-CM

## 2021-02-16 NOTE — Progress Notes (Signed)
I agree with the above plan 

## 2021-03-02 NOTE — Progress Notes (Signed)
Name: Meagan [name]   MRN: [id number]    DOB: Feb 09, 1951   Date:03/07/2021       Progress Note  Subjective  Chief Complaint  Follow Up  HPI  DMII: she states since she stopped Ozempic due to significant weight loss. She is currently taking Rybelsus 14 mg , A1C went from  6.9 % to 7.1 % . She is trying to eat healthier but she feels hungry all the time, snacking more often We will adjust dose of Levemir from 24 to 26 units and needs to resume healthier diet. She has dyslipidemia, HTN and microalbuminuria. FSBS at home has been above 150. She states she will try to resume a healthier diet   HTN: bp at home has been under control at home and also today during her visit No headaches, dizziness, chest pain or palpitation. She is on Benicar hctz 40/25, carvedilol BID and Norvasc, hydralazine is only prn   S/p NTI:RWER of CVA was 07/01/2019, admitted from 07/01/2019 until 07/08/2019 and had inpatient rehab until 07/17/2019. She developed right occipital headaches and bp was spiking . She went to eye doctor and bp was over 200's , he called me and we advised to send her to [facility] she arrived to [facility] with her daughter. CTA showed 45 % stenosis proximal right internal carotid artery , 60 % stenosis right external carotid artery , left distal common external carotid was 30 % , echo showed normal EF, LDL was very high at 199 , she is now on Atorvastatin and  Plavix 75 mg . She gained some of her vision back, but still has some peripheral vision loss on both sides, but worse on left side. She lives with her sister and needs assistance cooking, still not allowed to drive. She sees a retina sub-specialist   OSA: seen by Dr. Humphrey Rolls andhad sleep study, wearing cpap every night, tolerating it well She has been compliant   Morbid obesity: she has a BMI above 35 and multiple co-morbidities, she lost a few pounds since last visit. She has been snacking on unhealthy snacks again but states she will resume a healthier diet     Patient Active Problem List   Diagnosis Date Noted  . History of CVA with residual deficit 07/01/2019  . Leukocytosis 07/01/2019  . Polycythemia 07/01/2019  . Morbid obesity ([place]) 10/22/2018  . Non compliance with medical treatment 03/26/2018  . Dyslipidemia associated with type 2 diabetes mellitus ([place]) 12/04/2016  . Uncontrolled type 2 diabetes mellitus with microalbuminuria, without long-term current use of insulin ([place]) 12/04/2016  . Seasonal allergies 02/01/2016  . OSA (obstructive sleep apnea) 10/06/2015  . Hypertensive retinopathy 10/06/2015  . Thyroid cyst 10/06/2015  . Dyslipidemia 10/06/2015  . Type 2 diabetes, uncontrolled, with neuropathy ([place]) 10/06/2015  . History of pneumonia 10/06/2015  . Chronic neck pain 10/06/2015  . Carotid stenosis   . Hypertension, benign     Past Surgical History:  Procedure Laterality Date  . APPENDECTOMY  2000s  . BIOPSY THYROID  2006   [facility]  . BREAST EXCISIONAL BIOPSY Left 2000s   neg  . CARPAL TUNNEL RELEASE Left March 2015  . COLONOSCOPY  2011   DR.WOHL  . COLONOSCOPY WITH PROPOFOL N/A 07/11/2017   Procedure: COLONOSCOPY WITH PROPOFOL;  Surgeon: Jonathon Bellows, MD;  Location: [facility]COPY;  Service: Gastroenterology;  Laterality: N/A;  . ganglion cyst removal     . KNEE SURGERY Left 08/2009  . KNEE SURGERY Right 04/07/2010  . REFRACTIVE SURGERY Bilateral   .  ROTATOR CUFF REPAIR Right 2008  . TUBAL LIGATION      Family History  Problem Relation Age of Onset  . Breast cancer Maternal Aunt   . Hypertension Mother   . Kidney Stones Mother   . Hypertension Father   . Congestive Heart Failure Father   . Hypertension Paternal Grandfather   . Diabetes Sister   . Vision loss Sister   . Varicose Veins Sister   . Hypertension Sister   . Breast cancer Sister 25       Double Mastectomy  . Hypertension Sister     Social History   Tobacco Use  . Smoking status: Never Smoker  . Smokeless tobacco: Never Used  Substance Use  Topics  . Alcohol use: No     Current Outpatient Medications:  .  amLODipine (NORVASC) 5 MG tablet, Take 1 tablet (5 mg total) by mouth daily., Disp: 90 tablet, Rfl: 0 .  atorvastatin (LIPITOR) 80 MG tablet, TAKE 1 TABLET (80 MG TOTAL) BY MOUTH DAILY AT 6 PM., Disp: 90 tablet, Rfl: 2 .  BD PEN NEEDLE NANO 2ND GEN 32G X 4 MM MISC, USE AS DIRECTED DAILY, Disp: 100 each, Rfl: 11 .  blood glucose meter kit and supplies, Dispense based on patient and insurance preference. Use up to four times daily as directed. (FOR ICD-10 E10.9, E11.9)., Disp: 1 each, Rfl: 0 .  carvedilol (COREG) 6.25 MG tablet, TAKE 1 TABLET BY MOUTH 2 TIMES DAILY WITH A MEAL., Disp: 180 tablet, Rfl: 0 .  clopidogrel (PLAVIX) 75 MG tablet, Take 1 tablet (75 mg total) by mouth daily., Disp: 90 tablet, Rfl: 1 .  docusate sodium (COLACE) 100 MG capsule, Take 1 capsule (100 mg total) by mouth daily as needed for mild constipation., Disp: 60 capsule, Rfl: 0 .  glucose blood (ONETOUCH ULTRA) test strip, USE UP TO 4 TIMES DAILY AS DIRECTED, Disp: 200 strip, Rfl: 2 .  hydrALAZINE (APRESOLINE) 10 MG tablet, Take 1 tablet (10 mg total) by mouth 3 (three) times daily as needed. If bp goes above 150/90, Disp: 30 tablet, Rfl: 0 .  insulin detemir (LEVEMIR FLEXTOUCH) 100 UNIT/ML FlexPen, Inject 24-50 Units into the skin daily after breakfast., Disp: 45 mL, Rfl: 1 .  levocetirizine (XYZAL) 5 MG tablet, Take 5 mg by mouth daily as needed for allergies. , Disp: , Rfl:  .  olmesartan-hydrochlorothiazide (BENICAR HCT) 40-25 MG tablet, Take 1 tablet by mouth daily., Disp: 90 tablet, Rfl: 1 .  polyethylene glycol (MIRALAX / GLYCOLAX) 17 g packet, Take 17 g by mouth daily., Disp: 14 each, Rfl: 0 .  Semaglutide (RYBELSUS) 14 MG TABS, Take 1 tablet by mouth daily., Disp: 90 tablet, Rfl: 0 .  SENNA-PLUS 8.6-50 MG tablet, Take 2 tablets by mouth at bedtime., Disp: , Rfl:   Allergies  Allergen Reactions  . Amoxicillin     rash  . Bee Venom Swelling and  Other (See Comments)    Immediately takes Benadryl to counteract  . Wilder Glade [Dapagliflozin] Rash    Pt reports that she developed a rash when she took [place] and she would like it to be added as an allergy  . Keflex [Cephalexin] Rash    I personally reviewed active problem list, medication list, allergies, family history, social history, health maintenance with the patient/caregiver today.   ROS  Constitutional: Negative for fever or significant  weight change.  Respiratory: Negative for cough and shortness of breath.   Cardiovascular: Negative for chest pain or palpitations.  Gastrointestinal: Negative for abdominal pain, no bowel changes.  Musculoskeletal: positive  for gait problem but no  joint swelling.  Skin: Negative for rash.  Neurological: Negative for dizziness or headache.  No other specific complaints in a complete review of systems (except as listed in HPI above).  Objective  Vitals:   03/07/21 1440  BP: 132/78  Pulse: 60  Resp: 16  Temp: (!) 97.5 F (36.4 C)  TempSrc: Oral  SpO2: 98%  Weight: 213 lb (96.6 kg)  Height: '5\' 4"'  (1.626 m)    Body mass index is 36.56 kg/m.  Physical Exam  Constitutional: Patient appears well-developed and well-nourished. Obese No distress.  HEENT: head atraumatic, normocephalic, pupils equal and reactive to light, neck supple Cardiovascular: Normal rate, regular rhythm and normal heart sounds.  No murmur heard. No BLE edema. Pulmonary/Chest: Effort normal and breath sounds normal. No respiratory distress. Abdominal: Soft.  There is no tenderness. Neurological: still has mild left lower leg weakness and uses a cane  Psychiatric: Patient has a normal mood and affect. behavior is normal. Judgment and thought content normal.  Recent Results (from the past 2160 hour(s))  POCT HgB A1C     Status: Abnormal   Collection Time: 03/07/21  2:59 PM  Result Value Ref Range   Hemoglobin A1C 7.1 (A) 4.0 - 5.6 %   HbA1c POC (<> result,  manual entry)     HbA1c, POC (prediabetic range)     HbA1c, POC (controlled diabetic range)      Diabetic Foot Exam: Diabetic Foot Exam - Simple   Simple Foot Form Diabetic Foot exam was performed with the following findings: Yes 03/07/2021  3:14 PM  Visual Inspection See comments: Yes Sensation Testing Intact to touch and monofilament testing bilaterally: Yes Pulse Check See comments: Yes Comments Weaker on left pedis dorsalis - going to see vascular surgeon soon Thick nails      PHQ2/9: Depression screen [facility] 2/9 03/07/2021 11/04/2020 08/29/2020 07/01/2020 03/01/2020  Decreased Interest 0 0 0 0 0  Down, Depressed, Hopeless 0 0 0 0 0  PHQ - 2 Score 0 0 0 0 0  Altered sleeping - - - - 0  Tired, decreased energy - - - - 0  Change in appetite - - - - 0  Feeling bad or failure about yourself  - - - - 0  Trouble concentrating - - - - 0  Moving slowly or fidgety/restless - - - - 0  Suicidal thoughts - - - - 0  PHQ-9 Score - - - - 0  Difficult doing work/chores - - - - Not difficult at all  Some recent data might be hidden    phq 9 is negative   Fall Risk: Fall Risk  03/07/2021 11/04/2020 08/29/2020 07/01/2020 03/01/2020  Falls in the past year? 0 0 0 0 0  Number falls in past yr: 0 0 0 0 0  Injury with Fall? 0 0 0 0 0  Risk for fall due to : - - Impaired balance/gait - -  Follow up - - - - Falls evaluation completed    Functional Status Survey: Is the patient deaf or have difficulty hearing?: No Does the patient have difficulty seeing, even when wearing glasses/contacts?: Yes Does the patient have difficulty concentrating, remembering, or making decisions?: No Does the patient have difficulty walking or climbing stairs?: Yes Does the patient have difficulty dressing or bathing?: No Does the patient have difficulty doing errands alone such as visiting a doctor's office  or shopping?: No    Assessment & Plan  1. Type 2 diabetes mellitus with microalbuminuria, with long-term  current use of insulin (HCC)  - POCT HgB A1C - HM Diabetes Foot Exam - Ambulatory referral to Nephrology - Semaglutide (RYBELSUS) 14 MG TABS; Take 1 tablet by mouth daily.  Dispense: 90 tablet; Refill: 1  2. Morbid obesity ([place])  Discussed with the patient the risk posed by an increased BMI. Discussed importance of portion control, calorie counting and at least 150 minutes of physical activity weekly. Avoid sweet beverages and drink more water. Eat at least 6 servings of fruit and vegetables daily   3. Dyslipidemia associated with type 2 diabetes mellitus ([place])  - Ambulatory referral to Nephrology - Lipid panel  4. OSA (obstructive sleep apnea)   5. Hypertension, benign  - amLODipine (NORVASC) 5 MG tablet; Take 1 tablet (5 mg total) by mouth daily.  Dispense: 90 tablet; Refill: 1 - olmesartan-hydrochlorothiazide (BENICAR HCT) 40-25 MG tablet; Take 1 tablet by mouth daily.  Dispense: 90 tablet; Refill: 1  6. History of CVA with residual deficit  - clopidogrel (PLAVIX) 75 MG tablet; Take 1 tablet (75 mg total) by mouth daily.  Dispense: 90 tablet; Refill: 1 - Lipid panel  7. Carotid stenosis, bilateral  - Lipid panel  8. Hypertension associated with diabetes ([place])  - carvedilol (COREG) 6.25 MG tablet; Take 1 tablet (6.25 mg total) by mouth 2 (two) times daily with a meal.  Dispense: 180 tablet; Refill: 1 - Microalbumin / creatinine urine ratio - COMPLETE METABOLIC PANEL WITH GFR  9. Other proteinuria  Referral nephrologist   10. Leukopenia, unspecified type  - CBC with Differential/Platelet

## 2021-03-07 ENCOUNTER — Ambulatory Visit (INDEPENDENT_AMBULATORY_CARE_PROVIDER_SITE_OTHER): Payer: Medicare Other | Admitting: Family Medicine

## 2021-03-07 ENCOUNTER — Other Ambulatory Visit: Payer: Self-pay

## 2021-03-07 ENCOUNTER — Encounter: Payer: Self-pay | Admitting: Family Medicine

## 2021-03-07 VITALS — BP 132/78 | HR 60 | Temp 97.5°F | Resp 16 | Ht 64.0 in | Wt 213.0 lb

## 2021-03-07 DIAGNOSIS — E1169 Type 2 diabetes mellitus with other specified complication: Secondary | ICD-10-CM

## 2021-03-07 DIAGNOSIS — G4733 Obstructive sleep apnea (adult) (pediatric): Secondary | ICD-10-CM | POA: Diagnosis not present

## 2021-03-07 DIAGNOSIS — I152 Hypertension secondary to endocrine disorders: Secondary | ICD-10-CM

## 2021-03-07 DIAGNOSIS — E1159 Type 2 diabetes mellitus with other circulatory complications: Secondary | ICD-10-CM

## 2021-03-07 DIAGNOSIS — E785 Hyperlipidemia, unspecified: Secondary | ICD-10-CM

## 2021-03-07 DIAGNOSIS — D72819 Decreased white blood cell count, unspecified: Secondary | ICD-10-CM

## 2021-03-07 DIAGNOSIS — Z794 Long term (current) use of insulin: Secondary | ICD-10-CM

## 2021-03-07 DIAGNOSIS — I6523 Occlusion and stenosis of bilateral carotid arteries: Secondary | ICD-10-CM | POA: Diagnosis not present

## 2021-03-07 DIAGNOSIS — R809 Proteinuria, unspecified: Secondary | ICD-10-CM

## 2021-03-07 DIAGNOSIS — E1129 Type 2 diabetes mellitus with other diabetic kidney complication: Secondary | ICD-10-CM | POA: Diagnosis not present

## 2021-03-07 DIAGNOSIS — I6930 Unspecified sequelae of cerebral infarction: Secondary | ICD-10-CM

## 2021-03-07 DIAGNOSIS — R808 Other proteinuria: Secondary | ICD-10-CM

## 2021-03-07 DIAGNOSIS — I10 Essential (primary) hypertension: Secondary | ICD-10-CM

## 2021-03-07 LAB — POCT GLYCOSYLATED HEMOGLOBIN (HGB A1C): Hemoglobin A1C: 7.1 % — AB (ref 4.0–5.6)

## 2021-03-07 MED ORDER — AMLODIPINE BESYLATE 5 MG PO TABS: 5.0000 mg | ORAL_TABLET | Freq: Every day | ORAL | 1 refills | Status: DC

## 2021-03-07 MED ORDER — RYBELSUS 14 MG PO TABS: 1.0000 | ORAL_TABLET | Freq: Every day | ORAL | 1 refills | Status: DC

## 2021-03-07 MED ORDER — CLOPIDOGREL BISULFATE 75 MG PO TABS: 75.0000 mg | ORAL_TABLET | Freq: Every day | ORAL | 1 refills | Status: DC

## 2021-03-07 MED ORDER — CARVEDILOL 6.25 MG PO TABS: 6.2500 mg | ORAL_TABLET | Freq: Two times a day (BID) | ORAL | 1 refills | Status: DC

## 2021-03-07 MED ORDER — OLMESARTAN MEDOXOMIL-HCTZ 40-25 MG PO TABS: 1.0000 | ORAL_TABLET | Freq: Every day | ORAL | 1 refills | Status: DC

## 2021-03-08 LAB — COMPLETE METABOLIC PANEL WITH GFR
AG Ratio: 1.3 (calc) (ref 1.0–2.5)
ALT: 16 U/L (ref 6–29)
AST: 16 U/L (ref 10–35)
Albumin: 3.7 g/dL (ref 3.6–5.1)
Alkaline phosphatase (APISO): 95 U/L (ref 37–153)
BUN/Creatinine Ratio: 16 (calc) (ref 6–22)
BUN: 22 mg/dL (ref 7–25)
CO2: 30 mmol/L (ref 20–32)
Calcium: 9.3 mg/dL (ref 8.6–10.4)
Chloride: 105 mmol/L (ref 98–110)
Creat: 1.41 mg/dL — ABNORMAL HIGH (ref 0.50–0.99)
GFR, Est African American: 45 mL/min/{1.73_m2} — ABNORMAL LOW (ref >=60)
GFR, Est Non African American: 38 mL/min/{1.73_m2} — ABNORMAL LOW (ref >=60)
Globulin: 2.8 g/dL (calc) (ref 1.9–3.7)
Glucose, Bld: 177 mg/dL — ABNORMAL HIGH (ref 65–99)
Potassium: 4.4 mmol/L (ref 3.5–5.3)
Sodium: 142 mmol/L (ref 135–146)
Total Bilirubin: 0.4 mg/dL (ref 0.2–1.2)
Total Protein: 6.5 g/dL (ref 6.1–8.1)

## 2021-03-08 LAB — CBC WITH DIFFERENTIAL/PLATELET
Absolute Monocytes: 678 cells/uL (ref 200–950)
Basophils Absolute: 21 cells/uL (ref 0–200)
Basophils Relative: 0.2 %
Eosinophils Absolute: 223 cells/uL (ref 15–500)
Eosinophils Relative: 2.1 %
HCT: 32.8 % — ABNORMAL LOW (ref 35.0–45.0)
Hemoglobin: 10.9 g/dL — ABNORMAL LOW (ref 11.7–15.5)
Lymphs Abs: 2152 cells/uL (ref 850–3900)
MCH: 29.9 pg (ref 27.0–33.0)
MCHC: 33.2 g/dL (ref 32.0–36.0)
MCV: 89.9 fL (ref 80.0–100.0)
MPV: 9.8 fL (ref 7.5–12.5)
Monocytes Relative: 6.4 %
Neutro Abs: 7526 cells/uL (ref 1500–7800)
Neutrophils Relative %: 71 %
Platelets: 269 10*3/uL (ref 140–400)
RBC: 3.65 10*6/uL — ABNORMAL LOW (ref 3.80–5.10)
RDW: 12.7 % (ref 11.0–15.0)
Total Lymphocyte: 20.3 %
WBC: 10.6 10*3/uL (ref 3.8–10.8)

## 2021-03-08 LAB — LIPID PANEL
Cholesterol: 91 mg/dL (ref <200)
HDL: 33 mg/dL — ABNORMAL LOW (ref >=50)
LDL Cholesterol (Calc): 37 mg/dL (calc)
Non-HDL Cholesterol (Calc): 58 mg/dL (calc) (ref <130)
Total CHOL/HDL Ratio: 2.8 (calc) (ref <5.0)
Triglycerides: 126 mg/dL (ref <150)

## 2021-03-08 LAB — MICROALBUMIN / CREATININE URINE RATIO
Creatinine, Urine: 353 mg/dL — ABNORMAL HIGH (ref 20–275)
Microalb Creat Ratio: 642 mcg/mg creat — ABNORMAL HIGH (ref <30)
Microalb, Ur: 226.6 mg/dL

## 2021-03-09 IMAGING — MR MR HEAD W/O CM
9 of 11 series · 38 of 48 positions shown · non-contrast
Comparison: 12/19/2006

CLINICAL DATA: Acute severe headache. Arterial dissection
suspected. Possible temporal arteritis.



[Series 3: DWI · axial · 3.0mm · 0.94mm/px · z∈[-58,+93]mm · 11 of 104 slices shown (1 of 4)]
[im 1/104]
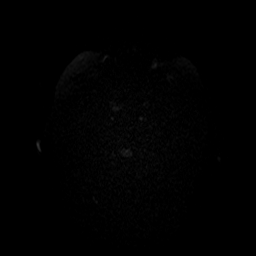
[im 11/104]
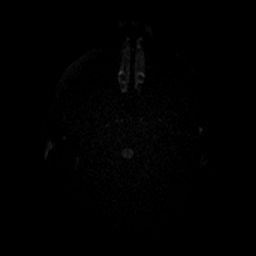
[im 21/104]
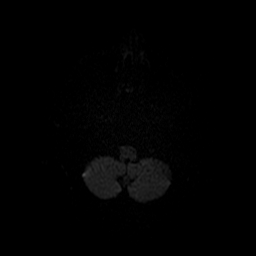
[im 31/104]
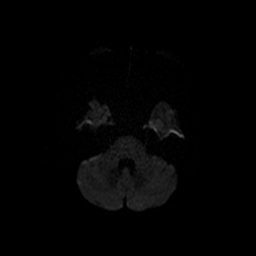
[im 42/104]
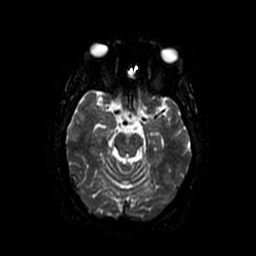
[im 52/104]
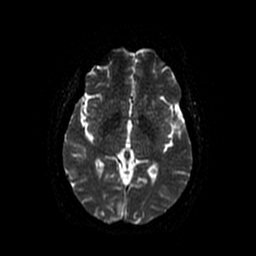
[im 62/104]
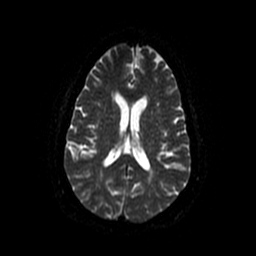
[im 73/104]
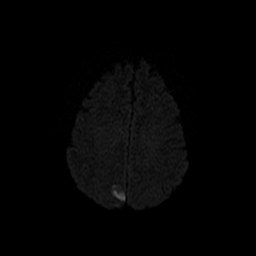
[im 83/104]
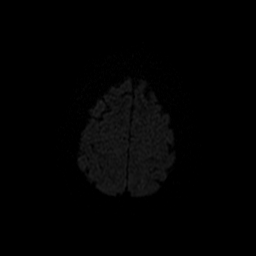
[im 93/104]
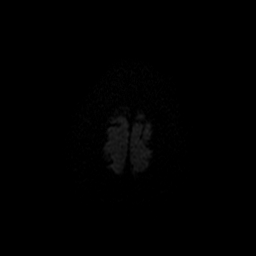
[im 104/104]
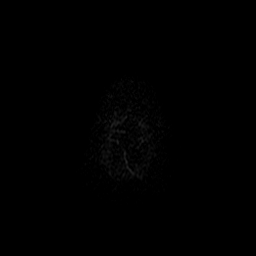

[Series 4: DWI · coronal · 5.0mm · 1.09mm/px · 7 of 78 slices shown (2 of 4)]
[im 1/78]
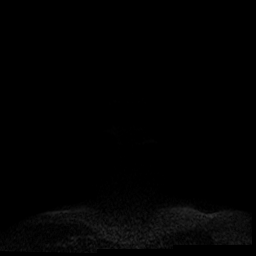
[im 13/78]
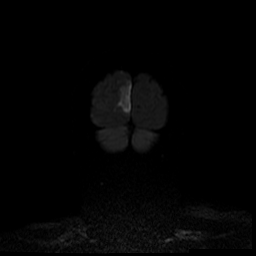
[im 26/78]
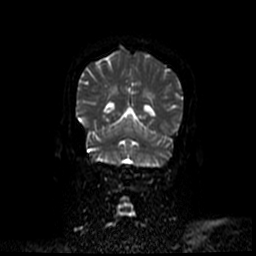
[im 39/78]
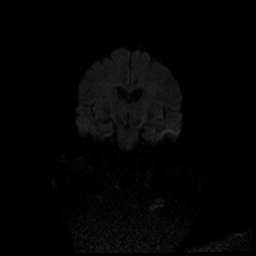
[im 52/78]
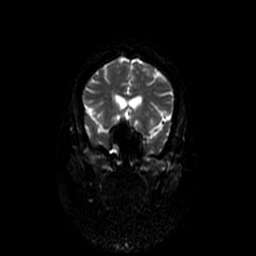
[im 65/78]
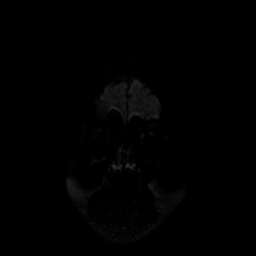
[im 78/78]
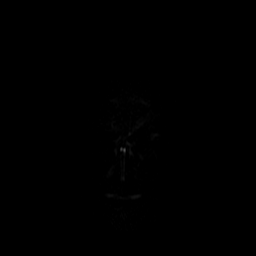

[Series 6: FLAIR · axial · 3.0mm · 0.43mm/px · z∈[-58,+90]mm · 2 of 26 slices shown]
[im 1/26]
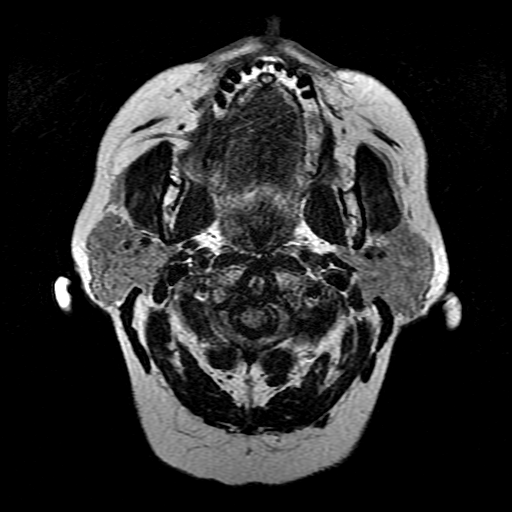
[im 26/26]
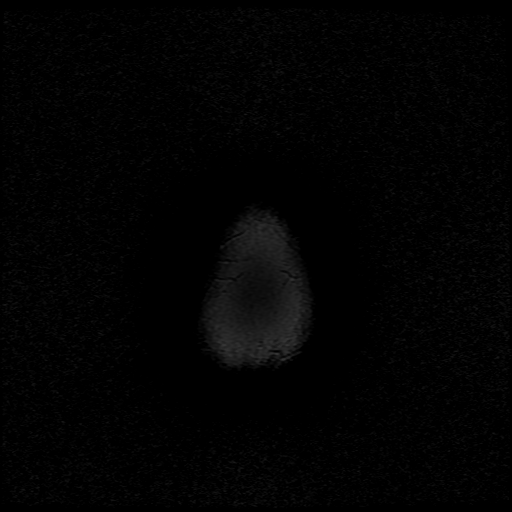

[Series 8: T1 · sagittal · 5.0mm · 0.47mm/px · 2 of 25 slices shown (1 of 2)]
[im 1/25]
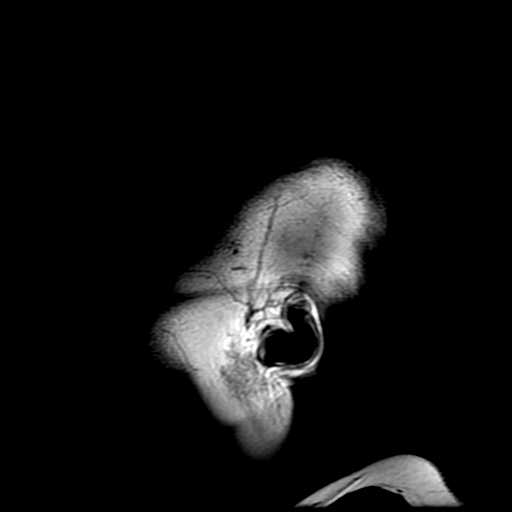
[im 25/25]
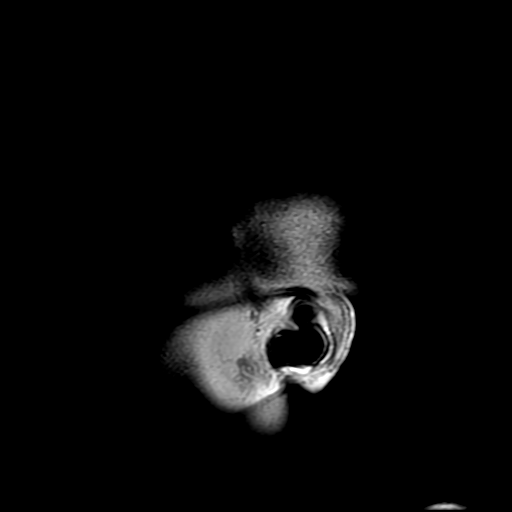

[Series 9: T2 · axial · 5.0mm · 0.47mm/px · z∈[-56,+92]mm · 2 of 26 slices shown (1 of 2)]
[im 1/26]
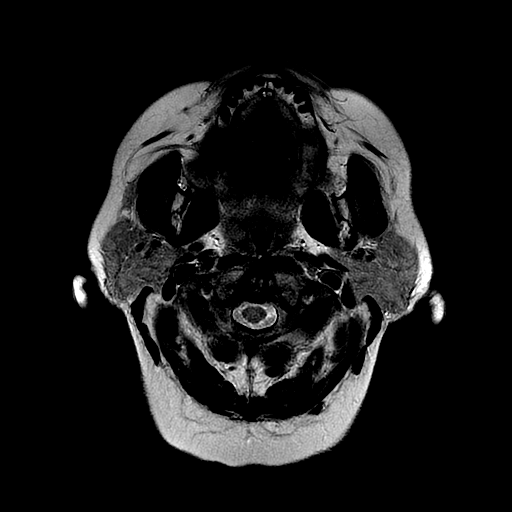
[im 26/26]
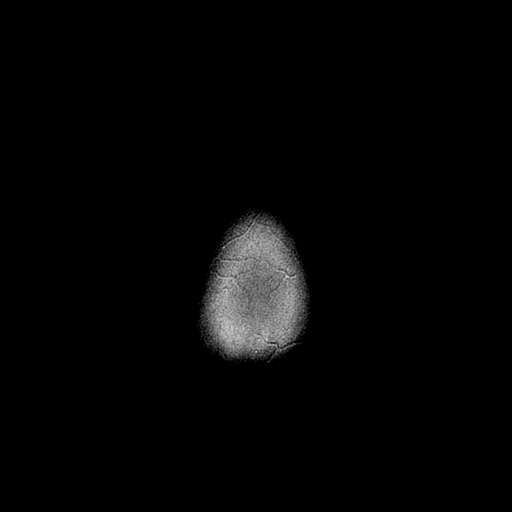

[Series 10: T1 · axial · 3.0mm · 0.94mm/px · z∈[-54,-36]mm · 2 of 100 slices shown (2 of 2)]
[im 1/100]
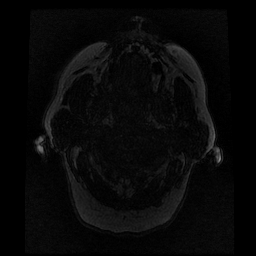
[im 13/100]
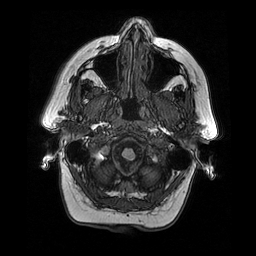

[Series 11: T2 · coronal · 5.0mm · 0.43mm/px · 3 of 33 slices shown (2 of 2)]
[im 1/33]
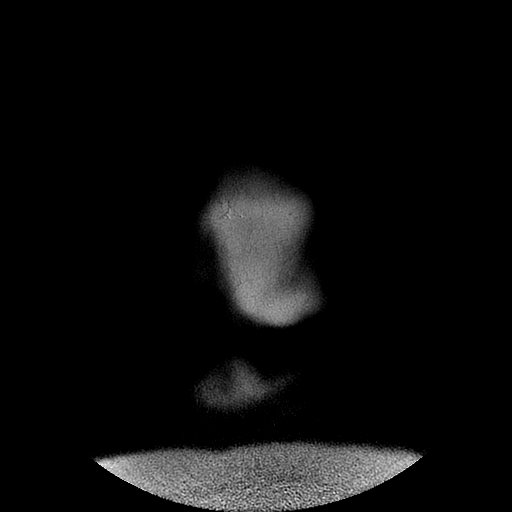
[im 17/33]
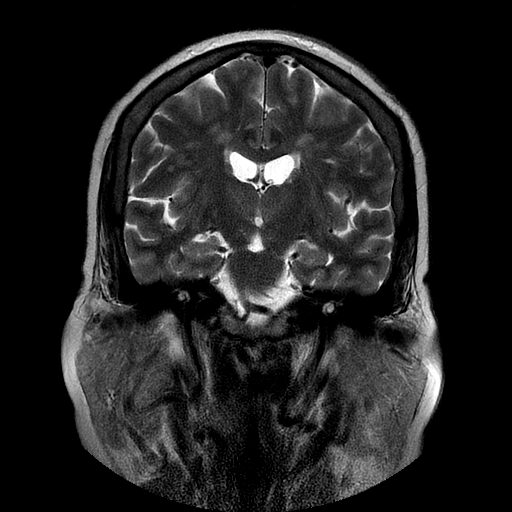
[im 33/33]
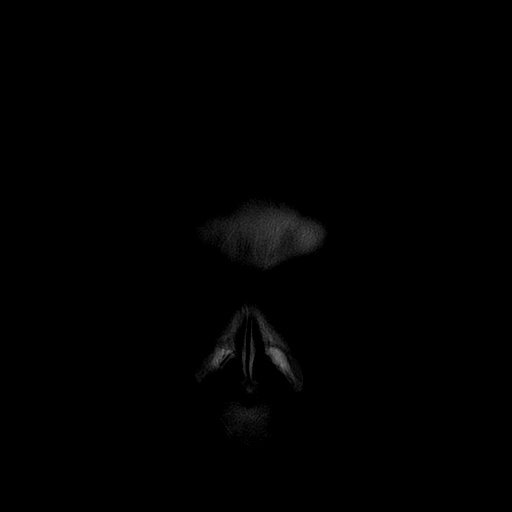

[Series 300: DWI · axial · 3.0mm · 0.94mm/px · z∈[-58,+93]mm · 5 of 52 slices shown (3 of 4)]
[im 1/52]
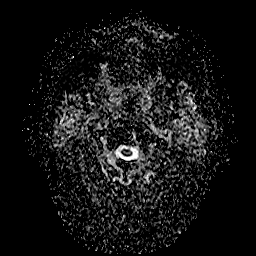
[im 13/52]
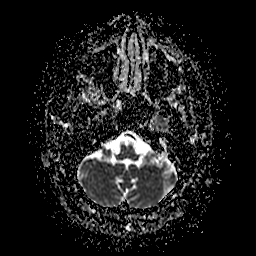
[im 26/52]
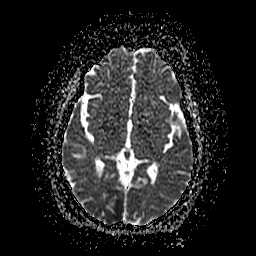
[im 39/52]
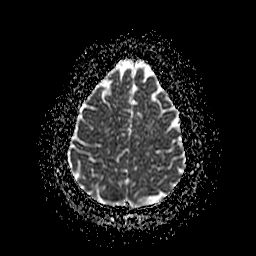
[im 52/52]
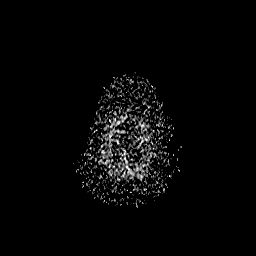

[Series 400: DWI · coronal · 5.0mm · 1.09mm/px · 4 of 39 slices shown (4 of 4)]
[im 1/39]
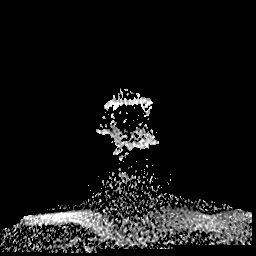
[im 13/39]
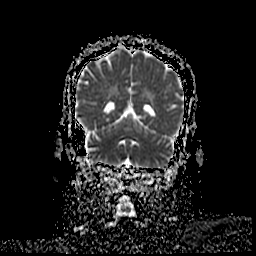
[im 26/39]
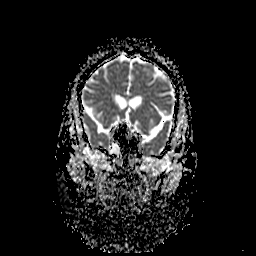
[im 39/39]
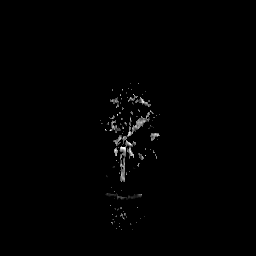

[38 of 48 positions shown; findings below may reference images not displayed]

FINDINGS: MRI HEAD FINDINGS

Brain: Diffusion imaging shows acute infarction at the
parieto-occipital junction region on the right affecting an area
measuring approximately 5 x 2 x 3 cm. Mild swelling but no visible
hemorrhage. No other acute finding. There chronic small-vessel
ischemic changes of the pons. No focal cerebellar insult. Cerebral
hemispheres show moderate chronic small-vessel ischemic changes
throughout the deep and subcortical hemispheric white matter. No
mass lesion, hydrocephalus or extra-axial collection.

Vascular: Major vessels at the base of the brain show flow.

Skull and upper cervical spine: Negative

Sinuses/Orbits: Clear/normal

Other: None

MRA HEAD FINDINGS

Both internal carotid arteries are widely patent into the brain. No
siphon stenosis. The anterior and middle cerebral vessels are patent
without proximal stenosis, aneurysm or vascular malformation.

Both vertebral arteries are widely patent to the basilar. No basilar
stenosis. Posterior circulation branch vessels show flow. There is
atherosclerotic narrowing and irregularity of the PCA branches, with
a focal stenosis at the right P1 P2 junction.

MRA NECK FINDINGS

The noncontrast exam is technically deficient but the patient could
not tolerate repeat. It can be stated that there is antegrade flow
in both vertebral arteries with the left being dominant. Both
vertebral arteries reach the basilar. No basilar stenosis is seen of
the proximal vessel. Little information can be obtained regarding
the carotid arteries in the neck. Both common carotid arteries do
show antegrade flow and both internal carotid arteries do appear to
be patent.
IMPRESSION: Acute infarction at the right parieto-occipital junction affecting a
region of brain measuring about 5 x 2 x 3 cm. No evidence of
hemorrhage.

Moderate chronic small-vessel ischemic changes elsewhere affecting
the pons in the cerebral hemispheric white matter.

Intracranial MR angiography does not show any large or medium vessel
occlusion. There is distal vessel atherosclerotic irregularity in
both posterior cerebral artery branches, including a focal stenosis
at the right P1 P2 junction.

No significant neck vessel disease suspected, but detail is quite
limited due to technical deficiency of the noncontrast neck MRA. The
patient would not allow repeat imaging.

## 2021-03-09 IMAGING — MR MR MRA NECK W/O CM
10 of 14 series · 24 of 48 positions shown · non-contrast
Comparison: 12/19/2006

CLINICAL DATA: Acute severe headache. Arterial dissection
suspected. Possible temporal arteritis.



[Series 4: DWI · coronal · 5.0mm · 1.09mm/px · 2 of 78 slices shown (1 of 3)]
[im 1/78]
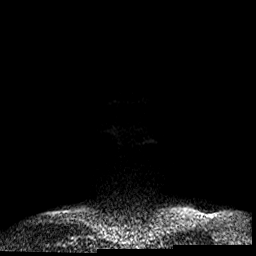
[im 78/78]
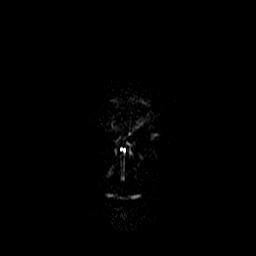

[Series 5: (id) mt fs · axial · 1.4mm · 0.43mm/px · z∈[-53,+57]mm · 6 of 160 slices shown]
[im 1/160]
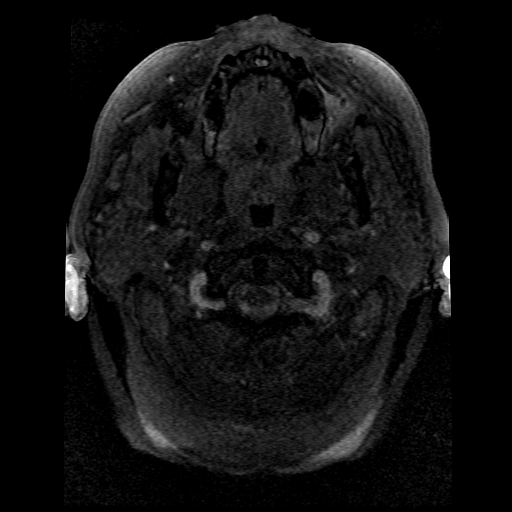
[im 32/160]
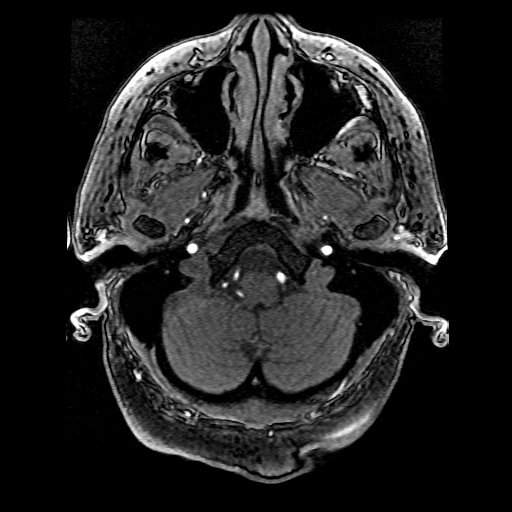
[im 64/160]
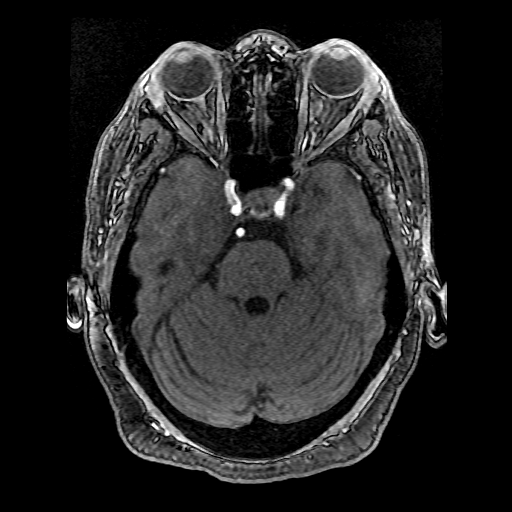
[im 96/160]
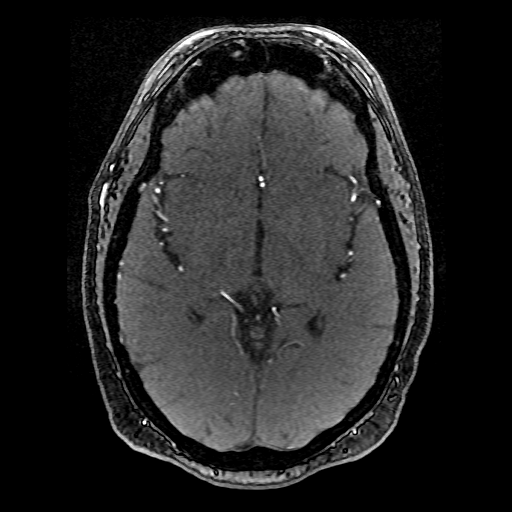
[im 128/160]
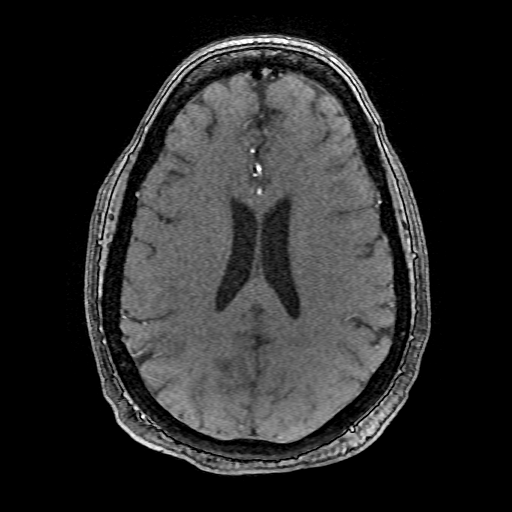
[im 160/160]
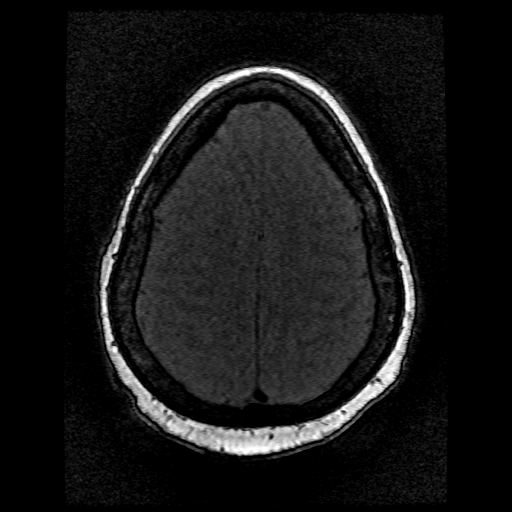

[Series 6: FLAIR · axial · 3.0mm · 0.43mm/px · 1 of 26 slices shown]
[im 1/26]
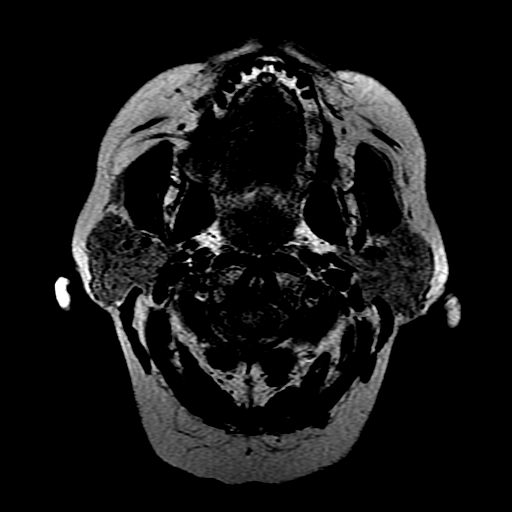

[Series 8: T1 · sagittal · 5.0mm · 0.47mm/px · 1 of 25 slices shown (1 of 2)]
[im 1/25]
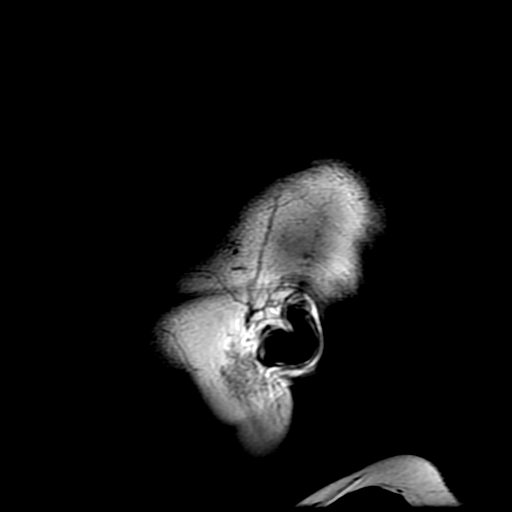

[Series 9: T2 · axial · 5.0mm · 0.47mm/px · 1 of 26 slices shown (1 of 2)]
[im 1/26]
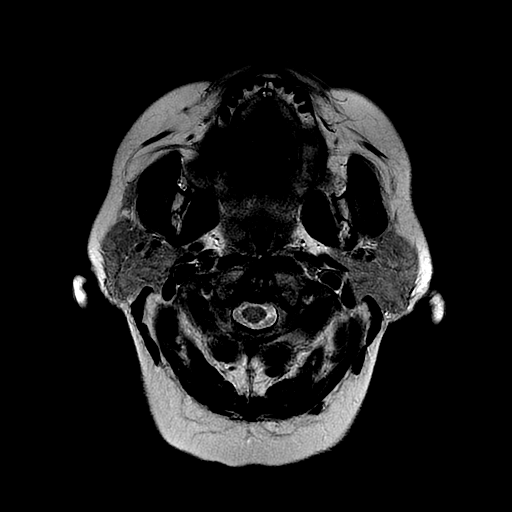

[Series 10: T1 · axial · 3.0mm · 0.94mm/px · z∈[-54,+92]mm · 4 of 100 slices shown (2 of 2)]
[im 1/100]
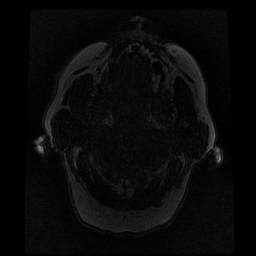
[im 34/100]
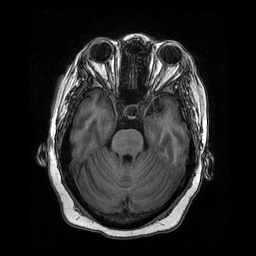
[im 67/100]
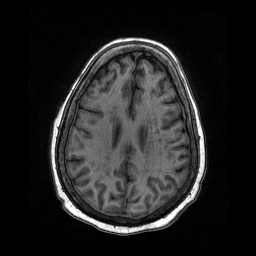
[im 100/100]
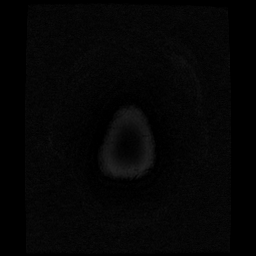

[Series 11: T2 · coronal · 5.0mm · 0.43mm/px · 1 of 33 slices shown (2 of 2)]
[im 1/33]
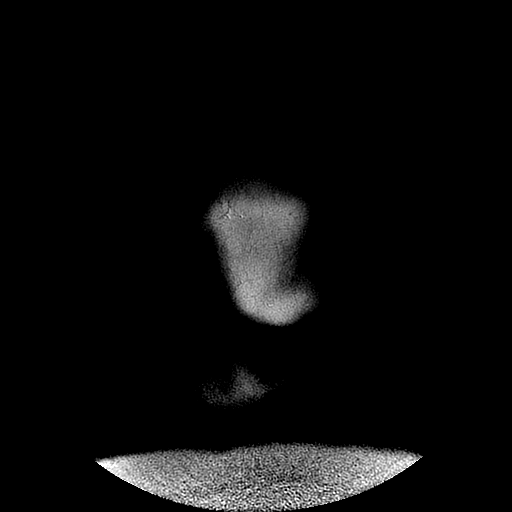

[Series 14: ax (id) · axial · 2.8mm · 0.47mm/px · z∈[-310,-191]mm · 4 of 228 slices shown]
[im 1/228]
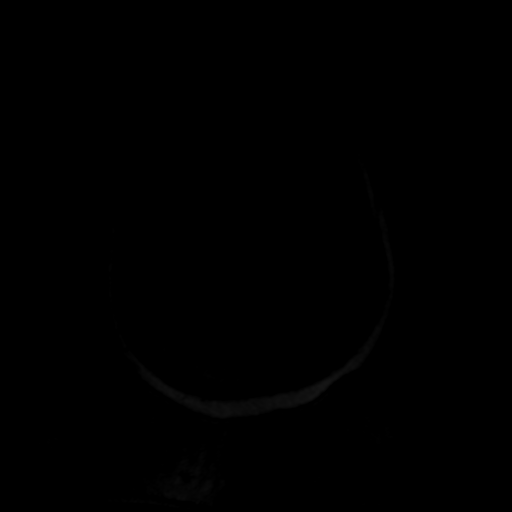
[im 29/228]
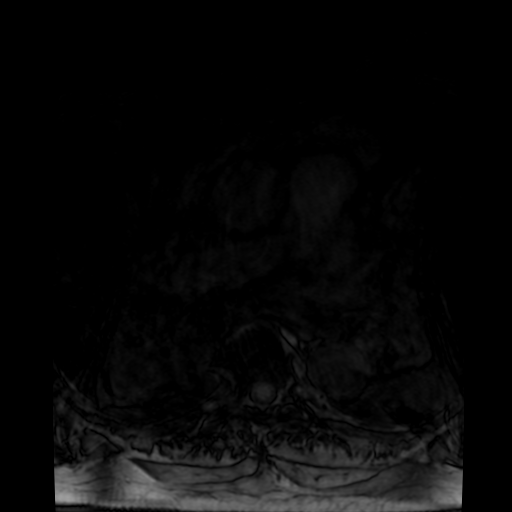
[im 57/228]
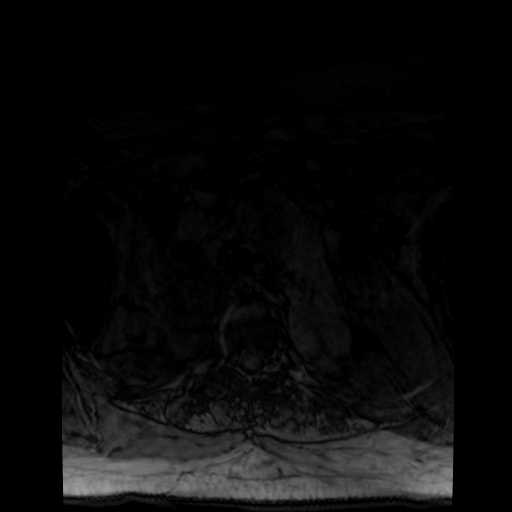
[im 86/228]
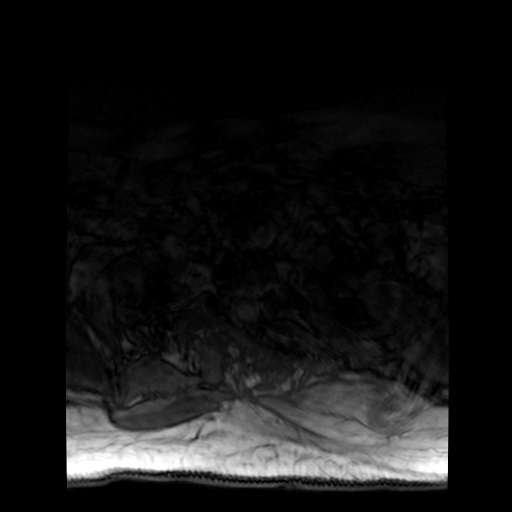

[Series 300: DWI · axial · 3.0mm · 0.94mm/px · z∈[-58,+93]mm · 2 of 52 slices shown (2 of 3)]
[im 1/52]
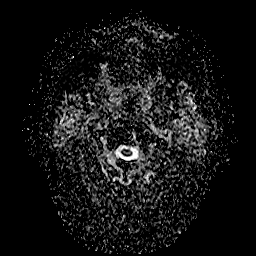
[im 52/52]
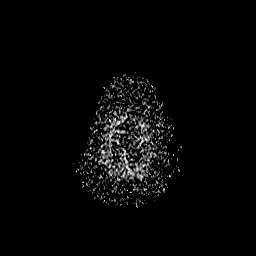

[Series 400: DWI · coronal · 5.0mm · 1.09mm/px · 2 of 39 slices shown (3 of 3)]
[im 1/39]
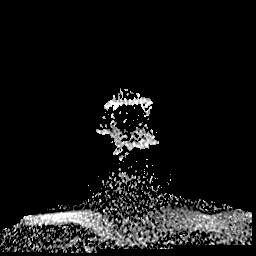
[im 39/39]
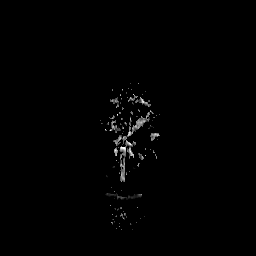

[24 of 48 positions shown; findings below may reference images not displayed]

FINDINGS: MRI HEAD FINDINGS

Brain: Diffusion imaging shows acute infarction at the
parieto-occipital junction region on the right affecting an area
measuring approximately 5 x 2 x 3 cm. Mild swelling but no visible
hemorrhage. No other acute finding. There chronic small-vessel
ischemic changes of the pons. No focal cerebellar insult. Cerebral
hemispheres show moderate chronic small-vessel ischemic changes
throughout the deep and subcortical hemispheric white matter. No
mass lesion, hydrocephalus or extra-axial collection.

Vascular: Major vessels at the base of the brain show flow.

Skull and upper cervical spine: Negative

Sinuses/Orbits: Clear/normal

Other: None

MRA HEAD FINDINGS

Both internal carotid arteries are widely patent into the brain. No
siphon stenosis. The anterior and middle cerebral vessels are patent
without proximal stenosis, aneurysm or vascular malformation.

Both vertebral arteries are widely patent to the basilar. No basilar
stenosis. Posterior circulation branch vessels show flow. There is
atherosclerotic narrowing and irregularity of the PCA branches, with
a focal stenosis at the right P1 P2 junction.

MRA NECK FINDINGS

The noncontrast exam is technically deficient but the patient could
not tolerate repeat. It can be stated that there is antegrade flow
in both vertebral arteries with the left being dominant. Both
vertebral arteries reach the basilar. No basilar stenosis is seen of
the proximal vessel. Little information can be obtained regarding
the carotid arteries in the neck. Both common carotid arteries do
show antegrade flow and both internal carotid arteries do appear to
be patent.
IMPRESSION: Acute infarction at the right parieto-occipital junction affecting a
region of brain measuring about 5 x 2 x 3 cm. No evidence of
hemorrhage.

Moderate chronic small-vessel ischemic changes elsewhere affecting
the pons in the cerebral hemispheric white matter.

Intracranial MR angiography does not show any large or medium vessel
occlusion. There is distal vessel atherosclerotic irregularity in
both posterior cerebral artery branches, including a focal stenosis
at the right P1 P2 junction.

No significant neck vessel disease suspected, but detail is quite
limited due to technical deficiency of the noncontrast neck MRA. The
patient would not allow repeat imaging.

## 2021-03-21 ENCOUNTER — Other Ambulatory Visit: Payer: Self-pay

## 2021-03-21 ENCOUNTER — Encounter: Payer: Self-pay | Admitting: Internal Medicine

## 2021-03-21 ENCOUNTER — Ambulatory Visit (INDEPENDENT_AMBULATORY_CARE_PROVIDER_SITE_OTHER): Payer: Medicare Other | Admitting: Internal Medicine

## 2021-03-21 VITALS — BP 146/76 | HR 85 | Temp 97.1°F | Resp 16 | Ht 64.5 in | Wt 216.4 lb

## 2021-03-21 DIAGNOSIS — I6523 Occlusion and stenosis of bilateral carotid arteries: Secondary | ICD-10-CM

## 2021-03-21 DIAGNOSIS — Z7189 Other specified counseling: Secondary | ICD-10-CM

## 2021-03-21 DIAGNOSIS — Z8673 Personal history of transient ischemic attack (TIA), and cerebral infarction without residual deficits: Secondary | ICD-10-CM

## 2021-03-21 DIAGNOSIS — G4733 Obstructive sleep apnea (adult) (pediatric): Secondary | ICD-10-CM | POA: Diagnosis not present

## 2021-03-21 NOTE — Patient Instructions (Signed)
Sleep Apnea Sleep apnea affects breathing during sleep. It causes breathing to stop for 10 seconds or more, or to become shallow. People with sleep apnea usually snoreloudly. It can also increase the risk of: Heart attack. Stroke. Being very overweight (obese). Diabetes. Heart failure. Irregular heartbeat. High blood pressure. The goal of treatment is to help you breathe normally again. What are the causes?  The most common cause of this condition is a collapsed or blocked airway. There are three kinds of sleep apnea: Obstructive sleep apnea. This is caused by a blocked or collapsed airway. Central sleep apnea. This happens when the brain does not send the right signals to the muscles that control breathing. Mixed sleep apnea. This is a combination of obstructive and central sleep apnea. What increases the risk? Being overweight. Smoking. Having a small airway. Being older. Being female. Drinking alcohol. Taking medicines to calm yourself (sedatives or tranquilizers). Having family members with the condition. Having a tongue or tonsils that are larger than normal. What are the signs or symptoms? Trouble staying asleep. Loud snoring. Headaches in the morning. Waking up gasping. Dry mouth or sore throat in the morning. Being sleepy or tired during the day. If you are sleepy or tired during the day, you may also: Not be able to focus your mind (concentrate). Forget things. Get angry a lot and have mood swings. Feel sad (depressed). Have changes in your personality. Have less interest in sex, if you are female. Be unable to have an erection, if you are female. How is this treated?  Sleeping on your side. Using a medicine to get rid of mucus in your nose (decongestant). Avoiding the use of alcohol, medicines to help you relax, or certain pain medicines (narcotics). Losing weight, if needed. Changing your diet. Quitting smoking. Using a machine to open your airway while you  sleep, such as: An oral appliance. This is a mouthpiece that shifts your lower jaw forward. A CPAP device. This device blows air through a mask when you breathe out (exhale). An EPAP device. This has valves that you put in each nostril. A BPAP device. This device blows air through a mask when you breathe in (inhale) and breathe out. Having surgery if other treatments do not work. Follow these instructions at home: Lifestyle Make changes that your doctor recommends. Eat a healthy diet. Lose weight if needed. Avoid alcohol, medicines to help you relax, and some pain medicines. Do not smoke or use any products that contain nicotine or tobacco. If you need help quitting, ask your doctor. General instructions Take over-the-counter and prescription medicines only as told by your doctor. If you were given a machine to use while you sleep, use it only as told by your doctor. If you are having surgery, make sure to tell your doctor you have sleep apnea. You may need to bring your device with you. Keep all follow-up visits. Contact a doctor if: The machine that you were given to use during sleep bothers you or does not seem to be working. You do not get better. You get worse. Get help right away if: Your chest hurts. You have trouble breathing in enough air. You have an uncomfortable feeling in your back, arms, or stomach. You have trouble talking. One side of your body feels weak. A part of your face is hanging down. These symptoms may be an emergency. Get help right away. Call your local emergency services (911 in the [place] Do not wait to see if the symptoms   will go away. Do not drive yourself to the hospital. Summary This condition affects breathing during sleep. The most common cause is a collapsed or blocked airway. The goal of treatment is to help you breathe normally while you sleep. This information is not intended to replace advice given to you by your health care provider. Make  sure you discuss any questions you have with your healthcare provider. Document Revised: 09/02/2020 Document Reviewed: 09/02/2020 Elsevier Patient Education  2022 [employer]  

## 2021-03-21 NOTE — Progress Notes (Signed)
[facility] [place],  [address]  Pulmonary Sleep Medicine   Office Visit Note  Patient Name: Meagan [name] DOB: [birth year] MRN [id number]  Date of Service: 03/21/2021  Complaints/HPI: She has been using her CPAP with good resluts. She misses some time due to sleep and napping in the daytime and she will fall asleep without the device. Patient also has a history of stroke which has affected her vision centers. No headaches noted no cough and chest pain noted  ROS  General: (-) fever, (-) chills, (-) night sweats, (-) weakness Skin: (-) rashes, (-) itching,. Eyes: (-) visual changes, (-) redness, (-) itching. Nose and Sinuses: (-) nasal stuffiness or itchiness, (-) postnasal drip, (-) nosebleeds, (-) sinus trouble. Mouth and Throat: (-) sore throat, (-) hoarseness. Neck: (-) swollen glands, (-) enlarged thyroid, (-) neck pain. Respiratory: - cough, (-) bloody sputum, - shortness of breath, - wheezing. Cardiovascular: - ankle swelling, (-) chest pain. Lymphatic: (-) lymph node enlargement. Neurologic: (-) numbness, (-) tingling. Psychiatric: (-) anxiety, (-) depression   Current Medication: Outpatient Encounter Medications as of 03/21/2021  Medication Sig Note   amLODipine (NORVASC) 5 MG tablet Take 1 tablet (5 mg total) by mouth daily.    atorvastatin (LIPITOR) 80 MG tablet TAKE 1 TABLET (80 MG TOTAL) BY MOUTH DAILY AT 6 PM.    BD PEN NEEDLE NANO 2ND GEN 32G X 4 MM MISC USE AS DIRECTED DAILY    blood glucose meter kit and supplies Dispense based on patient and insurance preference. Use up to four times daily as directed. (FOR ICD-10 E10.9, E11.9).    carvedilol (COREG) 6.25 MG tablet Take 1 tablet (6.25 mg total) by mouth 2 (two) times daily with a meal.    clopidogrel (PLAVIX) 75 MG tablet Take 1 tablet (75 mg total) by mouth daily.    docusate sodium (COLACE) 100 MG capsule Take 1 capsule (100 mg total) by mouth daily as needed for mild  constipation.    glucose blood (ONETOUCH ULTRA) test strip USE UP TO 4 TIMES DAILY AS DIRECTED    hydrALAZINE (APRESOLINE) 10 MG tablet Take 1 tablet (10 mg total) by mouth 3 (three) times daily as needed. If bp goes above 150/90    insulin detemir (LEVEMIR FLEXTOUCH) 100 UNIT/ML FlexPen Inject 24-50 Units into the skin daily after breakfast.    levocetirizine (XYZAL) 5 MG tablet Take 5 mg by mouth daily as needed for allergies.     olmesartan-hydrochlorothiazide (BENICAR HCT) 40-25 MG tablet Take 1 tablet by mouth daily.    polyethylene glycol (MIRALAX / GLYCOLAX) 17 g packet Take 17 g by mouth daily.    Semaglutide (RYBELSUS) 14 MG TABS Take 1 tablet by mouth daily.    SENNA-PLUS 8.6-50 MG tablet Take 2 tablets by mouth at bedtime. 11/04/2020: Takes one tablet in the am and one at bedtime    No facility-administered encounter medications on file as of 03/21/2021.    Surgical History: Past Surgical History:  Procedure Laterality Date   APPENDECTOMY  2000s   BIOPSY THYROID  2006   [facility]   BREAST EXCISIONAL BIOPSY Left 2000s   neg   CARPAL TUNNEL RELEASE Left March 2015   COLONOSCOPY  2011   DR.WOHL   COLONOSCOPY WITH PROPOFOL N/A 07/11/2017   Procedure: COLONOSCOPY WITH PROPOFOL;  Surgeon: Jonathon Bellows, MD;  Location: [facility] ENDOSCOPY;  Service: Gastroenterology;  Laterality: N/A;   ganglion cyst removal      KNEE SURGERY Left 08/2009  KNEE SURGERY Right 04/07/2010   REFRACTIVE SURGERY Bilateral    ROTATOR CUFF REPAIR Right 2008   TUBAL LIGATION      Medical History: Past Medical History:  Diagnosis Date   Cellulitis    left leg   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   Diffuse cystic mastopathy    Fall 2013   Hypertension 2004   Occlusion and stenosis of carotid artery without mention of cerebral infarction    Sleep apnea    Stroke [facility])     Family History: Family History  Problem Relation Age of Onset   Breast cancer Maternal Aunt     Hypertension Mother    Kidney Stones Mother    Hypertension Father    Congestive Heart Failure Father    Hypertension Paternal Grandfather    Diabetes Sister    Vision loss Sister    Varicose Veins Sister    Hypertension Sister    Breast cancer Sister 18       Double Mastectomy   Hypertension Sister     Social History: Social History   Socioeconomic History   Marital status: Widowed    Spouse name: Not on file   Number of children: 4   Years of education: Not on file   Highest education level: Master's degree (e.g., MA, MS, MEng, MEd, MSW, MBA)  Occupational History   Occupation: Higher education careers adviser   Tobacco Use   Smoking status: Never   Smokeless tobacco: Never  Vaping Use   Vaping Use: Never used  Substance and Sexual Activity   Alcohol use: No   Drug use: No   Sexual activity: Not Currently    Partners: Male    Comment: Widow for 14 years  Other Topics Concern   Not on file  Social History Narrative   She had a stroke on 06/2019, she has left peripheral vision loss since stroke   She moved in with her youngest sister    Social Determinants of Radio broadcast assistant Strain: Not on file  Food Insecurity: Not on file  Transportation Needs: Not on file  Physical Activity: Not on file  Stress: Not on file  Social Connections: Not on file  Intimate Partner Violence: Not on file    Vital Signs: Blood pressure (!) 146/76, pulse 85, temperature (!) 97.1 F (36.2 C), resp. rate 16, height 5' 4.5" (1.638 m), weight 216 lb 6.4 oz (98.2 kg), SpO2 98 %.  Examination: General Appearance: The patient is well-developed, well-nourished, and in no distress. Skin: Gross inspection of skin unremarkable. Head: normocephalic, no gross deformities. Eyes: no gross deformities noted. ENT: ears appear grossly normal no exudates. Neck: Supple. No thyromegaly. No LAD. Respiratory: no rhonchi noted. Cardiovascular: Normal S1 and S2 without murmur or rub. Extremities: No  cyanosis. pulses are equal. Neurologic: Alert and oriented. No involuntary movements.  LABS: Recent Results (from the past 2160 hour(s))  POCT HgB A1C     Status: Abnormal   Collection Time: 03/07/21  2:59 PM  Result Value Ref Range   Hemoglobin A1C 7.1 (A) 4.0 - 5.6 %   HbA1c POC (<> result, manual entry)     HbA1c, POC (prediabetic range)     HbA1c, POC (controlled diabetic range)    Lipid panel     Status: Abnormal   Collection Time: 03/07/21  3:38 PM  Result Value Ref Range   Cholesterol 91 <200 mg/dL   HDL 33 (L) > OR = 50 mg/dL  Triglycerides 126 <150 mg/dL   LDL Cholesterol (Calc) 37 mg/dL (calc)    Comment: Reference range: <100 . Desirable range <100 mg/dL for primary prevention;   <70 mg/dL for patients with CHD or diabetic patients  with > or = 2 CHD risk factors. Marland Kitchen LDL-C is now calculated using the Martin-Hopkins  calculation, which is a validated novel method providing  better accuracy than the Friedewald equation in the  estimation of LDL-C.  Cresenciano Genre et al. Annamaria Helling. 1610;960(45): 2061-2068  (http://education.[employer].com/faq/FAQ164)    Total CHOL/HDL Ratio 2.8 <5.0 (calc)   Non-HDL Cholesterol (Calc) 58 <130 mg/dL (calc)    Comment: For patients with diabetes plus 1 major ASCVD risk  factor, treating to a non-HDL-C goal of <100 mg/dL  (LDL-C of <70 mg/dL) is considered a therapeutic  option.   Microalbumin / creatinine urine ratio     Status: Abnormal   Collection Time: 03/07/21  3:38 PM  Result Value Ref Range   Creatinine, Urine 353 (H) 20 - 275 mg/dL    Comment: Verified by repeat analysis. .    Microalb, Ur 226.6 mg/dL    Comment: Verified by repeat analysis. Marland Kitchen Reference Range Not established    Microalb Creat Ratio 642 (H) <30 mcg/mg creat    Comment: . The ADA defines abnormalities in albumin excretion as follows: Marland Kitchen Albuminuria Category        Result (mcg/mg creatinine) . Normal to Mildly increased   <30 Moderately increased          30-299  Severely increased           > OR = 300 . The ADA recommends that at least two of three specimens collected within a 3-6 month period be abnormal before considering a patient to be within a diagnostic category.   COMPLETE METABOLIC PANEL WITH GFR     Status: Abnormal   Collection Time: 03/07/21  3:38 PM  Result Value Ref Range   Glucose, Bld 177 (H) 65 - 99 mg/dL    Comment: .            Fasting reference interval . For someone without known diabetes, a glucose value >125 mg/dL indicates that they may have diabetes and this should be confirmed with a follow-up test. .    BUN 22 7 - 25 mg/dL   Creat 1.41 (H) 0.50 - 0.99 mg/dL    Comment: For patients >11 years of age, the reference limit for Creatinine is approximately 13% higher for people identified as African-American. .    GFR, Est Non African American 38 (L) > OR = 60 mL/min/1.45m   GFR, Est African American 45 (L) > OR = 60 mL/min/1.769m  BUN/Creatinine Ratio 16 6 - 22 (calc)   Sodium 142 135 - 146 mmol/L   Potassium 4.4 3.5 - 5.3 mmol/L   Chloride 105 98 - 110 mmol/L   CO2 30 20 - 32 mmol/L   Calcium 9.3 8.6 - 10.4 mg/dL   Total Protein 6.5 6.1 - 8.1 g/dL   Albumin 3.7 3.6 - 5.1 g/dL   Globulin 2.8 1.9 - 3.7 g/dL (calc)   AG Ratio 1.3 1.0 - 2.5 (calc)   Total Bilirubin 0.4 0.2 - 1.2 mg/dL   Alkaline phosphatase (APISO) 95 37 - 153 U/L   AST 16 10 - 35 U/L   ALT 16 6 - 29 U/L  CBC with Differential/Platelet     Status: Abnormal   Collection Time: 03/07/21  3:38 PM  Result  Value Ref Range   WBC 10.6 3.8 - 10.8 Thousand/uL   RBC 3.65 (L) 3.80 - 5.10 Million/uL   Hemoglobin 10.9 (L) 11.7 - 15.5 g/dL   HCT 32.8 (L) 35.0 - 45.0 %   MCV 89.9 80.0 - 100.0 fL   MCH 29.9 27.0 - 33.0 pg   MCHC 33.2 32.0 - 36.0 g/dL   RDW 12.7 11.0 - 15.0 %   Platelets 269 140 - 400 Thousand/uL   MPV 9.8 7.5 - 12.5 fL   Neutro Abs 7,526 1,500 - 7,800 cells/uL   Lymphs Abs 2,152 850 - 3,900 cells/uL   Absolute  Monocytes 678 200 - 950 cells/uL   Eosinophils Absolute 223 15 - 500 cells/uL   Basophils Absolute 21 0 - 200 cells/uL   Neutrophils Relative % 71 %   Total Lymphocyte 20.3 %   Monocytes Relative 6.4 %   Eosinophils Relative 2.1 %   Basophils Relative 0.2 %    Radiology: DG Bone Density  Result Date: 03/23/2020 EXAM: DUAL X-RAY ABSORPTIOMETRY (DXA) FOR BONE MINERAL DENSITY IMPRESSION: Your patient Mikeisha Lemonds completed a BMD test on 03/23/2020 using the [place] (software version: 14.10) manufactured by [employer]. The following summarizes the results of our evaluation. Technologist:MTB PATIENT BIOGRAPHICAL: Name: Aloura, Matsuoka Patient ID: [id number] Birth Date: 10-03-1951 Height: 64.0 in. Gender: Female Exam Date: 03/23/2020 Weight: 201.0 lbs. Indications: Diabetic, Postmenopausal Fractures: Treatments: DENSITOMETRY RESULTS: Site         Region     Measured Date Measured Age WHO Classification Young Adult T-score BMD         %Change vs. Previous Significant Change (*) AP Spine L1-L4 03/23/2020 66.8 Normal -0.4 1.150 g/cm2 - - DualFemur Neck Right 03/23/2020 66.8 Normal -0.9 0.914 g/cm2 - - DualFemur Total Mean 03/23/2020 66.8 Normal -0.5 0.946 g/cm2 - - Left Forearm Radius 33% 03/23/2020 66.8 Normal 0.7 0.937 g/cm2 - - ASSESSMENT: The BMD measured at Femur Neck Right is 0.914 g/cm2 with a T-score of -0.9. This patient is considered normal according to [facility]) criteria. The scan quality is good. World Pharmacologist [facility]) criteria for post-menopausal, Caucasian Women: Normal:                   T-score at or above -1 SD Osteopenia/low bone mass: T-score between -1 and -2.5 SD Osteoporosis:             T-score at or below -2.5 SD RECOMMENDATIONS: 1. All patients should optimize calcium and vitamin D intake. 2. Consider FDA-approved medical therapies in postmenopausal women and men aged 49 years and older, based on the following: a. A hip or  vertebral(clinical or morphometric) fracture b. T-score < -2.5 at the femoral neck or spine after appropriate evaluation to exclude secondary causes c. Low bone mass (T-score between -1.0 and -2.5 at the femoral neck or spine) and a 10-year probability of a hip fracture > 3% or a 10-year probability of a major osteoporosis-related fracture > 20% based on the [place]-adapted WHO algorithm 3. Clinician judgment and/or patient preferences may indicate treatment for people with 10-year fracture probabilities above or below these levels FOLLOW-UP: People with diagnosed cases of osteoporosis or at high risk for fracture should have regular bone mineral density tests. For patients eligible for Medicare, routine testing is allowed once every 2 years. The testing frequency can be increased to one year for patients who have rapidly progressing disease, those who are receiving or discontinuing medical  therapy to restore bone mass, or have additional risk factors. I have reviewed this report, and agree with the above findings. [facility] Electronically Signed   By: Rolm Baptise M.D.   On: 03/23/2020 19:58   MM 3D SCREEN BREAST BILATERAL  Result Date: 03/25/2020 CLINICAL DATA:  Screening. EXAM: DIGITAL SCREENING BILATERAL MAMMOGRAM WITH TOMO AND CAD COMPARISON:  Previous exam(s). ACR Breast Density Category c: The breast tissue is heterogeneously dense, which may obscure small masses. FINDINGS: There are no findings suspicious for malignancy. Images were processed with CAD. IMPRESSION: No mammographic evidence of malignancy. A result letter of this screening mammogram will be mailed directly to the patient. RECOMMENDATION: Screening mammogram in one year. (Code:SM-B-01Y) BI-RADS CATEGORY  1: Negative. Electronically Signed   By: Ammie Ferrier M.D.   On: 03/25/2020 09:10    No results found.  No results found.    Assessment and Plan: Patient Active Problem List   Diagnosis Date Noted   History of  CVA with residual deficit 07/01/2019   Leukocytosis 07/01/2019   Polycythemia 07/01/2019   Morbid obesity ([place]) 10/22/2018   Non compliance with medical treatment 03/26/2018   Dyslipidemia associated with type 2 diabetes mellitus ([place]) 12/04/2016   Uncontrolled type 2 diabetes mellitus with microalbuminuria, without long-term current use of insulin ([place]) 12/04/2016   Seasonal allergies 02/01/2016   OSA (obstructive sleep apnea) 10/06/2015   Hypertensive retinopathy 10/06/2015   Thyroid cyst 10/06/2015   Dyslipidemia 10/06/2015   Type 2 diabetes, uncontrolled, with neuropathy ([place]) 10/06/2015   History of pneumonia 10/06/2015   Chronic neck pain 10/06/2015   Carotid stenosis    Hypertension, benign     1. Obstructive sleep apnea Doing well with the CPAP device she has had excellent compliance and will continue to use current pressures  2. CPAP use counseling CPAP Counseling: had a lengthy discussion with the patient regarding the importance of PAP therapy in management of the sleep apnea. Patient appears to understand the risk factor reduction and also understands the risks associated with untreated sleep apnea. Patient will try to make a good faith effort to remain compliant with therapy. Also instructed the patient on proper cleaning of the device including the water must be changed daily if possible and use of distilled water is preferred. Patient understands that the machine should be regularly cleaned with appropriate recommended cleaning solutions that do not damage the PAP machine for example given white vinegar and water rinses. Other methods such as ozone treatment may not be as good as these simple methods to achieve cleaning.   3. Morbid obesity ([place]) Obesity Counseling: Had a lengthy discussion regarding patients BMI and weight issues. Patient was instructed on portion control as well as increased activity. Also discussed caloric restrictions with trying to maintain intake less  than 2000 Kcal. Discussions were made in accordance with the 5As of weight management. Simple actions such as not eating late and if able to, taking a walk is suggested.   4. History of stroke in prior 3 months Stable has some visual loss but she has done well with support from family for driving etc   General Counseling: I have discussed the findings of the evaluation and examination with Terrence Dupont.  I have also discussed any further diagnostic evaluation thatmay be needed or ordered today. Misao verbalizes understanding of the findings of todays visit. We also reviewed her medications today and discussed drug interactions and side effects including but not limited excessive drowsiness and altered mental states.  We also discussed that there is always a risk not just to her but also people around her. she has been encouraged to call the office with any questions or concerns that should arise related to todays visit.  No orders of the defined types were placed in this encounter.    Time spent: 66  I have personally obtained a history, examined the patient, evaluated laboratory and imaging results, formulated the assessment and plan and placed orders.    Allyne Gee, MD [facility] Pulmonary and Critical Care Sleep medicine

## 2021-03-24 ENCOUNTER — Other Ambulatory Visit: Payer: Self-pay | Admitting: Family Medicine

## 2021-03-24 NOTE — Telephone Encounter (Signed)
Pts sister  called and stated she only has one test strip left and needs sent to pharmacy today if possible / please advise

## 2021-03-24 NOTE — Telephone Encounter (Signed)
Last seen 5.31.2022 next sch'd 9.28.2022

## 2021-03-24 NOTE — Telephone Encounter (Signed)
   Notes to clinic:  this script has expired  Review for new script    Requested Prescriptions  Pending Prescriptions Disp Refills   ONETOUCH ULTRA test strip [Pharmacy Med Name: [facility] TEST STRP] 200 strip 2    Sig: USE UP TO 4 TIMES DAILY AS DIRECTED      Endocrinology: Diabetes - Testing Supplies Passed - 03/24/2021  3:04 PM      Passed - Valid encounter within last 12 months    Recent Outpatient Visits           2 weeks ago Type 2 diabetes mellitus with microalbuminuria, with long-term current use of insulin [facility])   [facility] [place], Drue Stager, MD   4 months ago Type 2 diabetes mellitus with microalbuminuria, with long-term current use of insulin [facility])   [facility] [place], Drue Stager, MD   6 months ago Uncontrolled type 2 diabetes mellitus with microalbuminuria, without long-term current use of insulin [facility])   [facility] [place], Drue Stager, MD   8 months ago Uncontrolled type 2 diabetes mellitus with microalbuminuria, without long-term current use of insulin [facility])   [facility] [place], Drue Stager, MD   1 year ago Dyslipidemia associated with type 2 diabetes mellitus [facility])   [facility] Steele Sizer, MD       Future Appointments             In 3 months Ancil Boozer, Drue Stager, MD [facility]

## 2021-03-27 ENCOUNTER — Ambulatory Visit (INDEPENDENT_AMBULATORY_CARE_PROVIDER_SITE_OTHER): Payer: Medicare Other

## 2021-03-27 ENCOUNTER — Other Ambulatory Visit: Payer: Self-pay

## 2021-03-27 ENCOUNTER — Ambulatory Visit (INDEPENDENT_AMBULATORY_CARE_PROVIDER_SITE_OTHER): Payer: Medicare Other | Admitting: Vascular Surgery

## 2021-03-27 ENCOUNTER — Encounter (INDEPENDENT_AMBULATORY_CARE_PROVIDER_SITE_OTHER): Payer: Self-pay | Admitting: Vascular Surgery

## 2021-03-27 VITALS — BP 134/84 | HR 81 | Ht 64.0 in | Wt 214.0 lb

## 2021-03-27 DIAGNOSIS — E1140 Type 2 diabetes mellitus with diabetic neuropathy, unspecified: Secondary | ICD-10-CM | POA: Diagnosis not present

## 2021-03-27 DIAGNOSIS — E785 Hyperlipidemia, unspecified: Secondary | ICD-10-CM

## 2021-03-27 DIAGNOSIS — I10 Essential (primary) hypertension: Secondary | ICD-10-CM | POA: Diagnosis not present

## 2021-03-27 DIAGNOSIS — E1165 Type 2 diabetes mellitus with hyperglycemia: Secondary | ICD-10-CM

## 2021-03-27 DIAGNOSIS — IMO0002 Reserved for concepts with insufficient information to code with codable children: Secondary | ICD-10-CM

## 2021-03-27 DIAGNOSIS — I6523 Occlusion and stenosis of bilateral carotid arteries: Secondary | ICD-10-CM

## 2021-03-27 NOTE — Progress Notes (Signed)
MRN : [id number]  Meagan [name] is a 70 y.o. (23-Aug-1951) female who presents with chief complaint of  Chief Complaint  Patient presents with   Follow-up    1 year carotid  .  History of Present Illness:   The patient is seen for evaluation of carotid stenosis. The carotid stenosis was identified after she experienced a stroke. The CVA was posterior. Nevertheless, work-up included a CT angiogram which has demonstrated less than 50% bilateral internal carotid artery stenosis. There is a 60% stenosis of the right external carotid artery. At the time of her CVA the patient was in a hypertensive crisis and complaining of an occipital headache   The patient denies amaurosis fugax. There is no recent history of TIA symptoms or focal motor deficits. There is no prior documented CVA.   She denies further severe headaches since her stroke. There is no history of seizures.   The patient is taking enteric-coated aspirin 81 mg daily.   The patient has a history of coronary artery disease, no recent episodes of angina or shortness of breath. The patient denies PAD or claudication symptoms. There is a history of hyperlipidemia which is being treated with a statin.    Duplex Ultrasound of the RICA 54-56% and the LICA <25%, no change from study 03/28/2020  Current Meds  Medication Sig   amLODipine (NORVASC) 5 MG tablet Take 1 tablet (5 mg total) by mouth daily.   atorvastatin (LIPITOR) 80 MG tablet TAKE 1 TABLET (80 MG TOTAL) BY MOUTH DAILY AT 6 PM.   BD PEN NEEDLE NANO 2ND GEN 32G X 4 MM MISC USE AS DIRECTED DAILY   blood glucose meter kit and supplies Dispense based on patient and insurance preference. Use up to four times daily as directed. (FOR ICD-10 E10.9, E11.9).   carvedilol (COREG) 6.25 MG tablet Take 1 tablet (6.25 mg total) by mouth 2 (two) times daily with a meal.   clopidogrel (PLAVIX) 75 MG tablet Take 1 tablet (75 mg total) by mouth daily.   docusate sodium (COLACE) 100 MG capsule  Take 1 capsule (100 mg total) by mouth daily as needed for mild constipation.   hydrALAZINE (APRESOLINE) 10 MG tablet Take 1 tablet (10 mg total) by mouth 3 (three) times daily as needed. If bp goes above 150/90   insulin detemir (LEVEMIR FLEXTOUCH) 100 UNIT/ML FlexPen Inject 24-50 Units into the skin daily after breakfast.   levocetirizine (XYZAL) 5 MG tablet Take 5 mg by mouth daily as needed for allergies.    olmesartan-hydrochlorothiazide (BENICAR HCT) 40-25 MG tablet Take 1 tablet by mouth daily.   ONETOUCH ULTRA test strip USE UP TO 4 TIMES DAILY AS DIRECTED   polyethylene glycol (MIRALAX / GLYCOLAX) 17 g packet Take 17 g by mouth daily.   Semaglutide (RYBELSUS) 14 MG TABS Take 1 tablet by mouth daily.   SENNA-PLUS 8.6-50 MG tablet Take 2 tablets by mouth at bedtime.    Past Medical History:  Diagnosis Date   Cellulitis    left leg   Diabetes mellitus without complication ([place])    non-insulin dependent   Diffuse cystic mastopathy    Fall 2013   Hypertension 2004   Occlusion and stenosis of carotid artery without mention of cerebral infarction    Sleep apnea    Stroke [facility])     Past Surgical History:  Procedure Laterality Date   APPENDECTOMY  2000s   BIOPSY THYROID  2006   [facility]   BREAST EXCISIONAL BIOPSY  Left 2000s   neg   CARPAL TUNNEL RELEASE Left March 2015   COLONOSCOPY  2011   DR[facility]   COLONOSCOPY WITH PROPOFOL N/A 07/11/2017   Procedure: COLONOSCOPY WITH PROPOFOL;  Surgeon: Jonathon Bellows, MD;  Location: [facility] ENDOSCOPY;  Service: Gastroenterology;  Laterality: N/A;   ganglion cyst removal      KNEE SURGERY Left 08/2009   KNEE SURGERY Right 04/07/2010   REFRACTIVE SURGERY Bilateral    ROTATOR CUFF REPAIR Right 2008   TUBAL LIGATION      Social History Social History   Tobacco Use   Smoking status: Never   Smokeless tobacco: Never  Vaping Use   Vaping Use: Never used  Substance Use Topics   Alcohol use: No   Drug use: No    Family History Family History   Problem Relation Age of Onset   Breast cancer Maternal Aunt    Hypertension Mother    Kidney Stones Mother    Hypertension Father    Congestive Heart Failure Father    Hypertension Paternal Grandfather    Diabetes Sister    Vision loss Sister    Varicose Veins Sister    Hypertension Sister    Breast cancer Sister 33       Double Mastectomy   Hypertension Sister     Allergies  Allergen Reactions   Amoxicillin     rash   Bee Venom Swelling and Other (See Comments)    Immediately takes Benadryl to counteract   [place] [Dapagliflozin] Rash    Pt reports that she developed a rash when she took [place] and she would like it to be added as an allergy   Keflex [Cephalexin] Rash     REVIEW OF SYSTEMS (Negative unless checked)  Constitutional: '[]' Weight loss  '[]' Fever  '[]' Chills Cardiac: '[]' Chest pain   '[]' Chest pressure   '[]' Palpitations   '[]' Shortness of breath when laying flat   '[]' Shortness of breath with exertion. Vascular:  '[]' Pain in legs with walking   '[]' Pain in legs at rest  '[]' History of DVT   '[]' Phlebitis   '[]' Swelling in legs   '[]' Varicose veins   '[]' Non-healing ulcers Pulmonary:   '[]' Uses home oxygen   '[]' Productive cough   '[]' Hemoptysis   '[]' Wheeze  '[]' COPD   '[]' Asthma Neurologic:  '[]' Dizziness   '[]' Seizures   '[]' History of stroke   '[]' History of TIA  '[]' Aphasia   '[]' Vissual changes   '[]' Weakness or numbness in arm   '[]' Weakness or numbness in leg Musculoskeletal:   '[]' Joint swelling   '[]' Joint pain   '[]' Low back pain Hematologic:  '[]' Easy bruising  '[]' Easy bleeding   '[]' Hypercoagulable state   '[]' Anemic Gastrointestinal:  '[]' Diarrhea   '[]' Vomiting  '[]' Gastroesophageal reflux/heartburn   '[]' Difficulty swallowing. Genitourinary:  '[]' Chronic kidney disease   '[]' Difficult urination  '[]' Frequent urination   '[]' Blood in urine Skin:  '[]' Rashes   '[]' Ulcers  Psychological:  '[]' History of anxiety   '[]'  History of major depression.  Physical Examination  Vitals:   03/27/21 1444  BP: 134/84  Pulse: 81  Weight: 214 lb  (97.1 kg)  Height: '5\' 4"'  (1.626 m)   Body mass index is 36.73 kg/m. Gen: WD/WN, NAD Head: [place]/AT, No temporalis wasting.  Ear/Nose/Throat: Hearing grossly intact, nares w/o erythema or drainage Eyes: PER, EOMI, sclera nonicteric.  Neck: Supple, no large masses.   Pulmonary:  Good air movement, no audible wheezing bilaterally, no use of accessory muscles.  Cardiac: RRR, no JVD Vascular:   Right carotid bruit Vessel Right Left  Radial Palpable Palpable  Carotid Palpable Palpable  Gastrointestinal: Non-distended. No guarding/no peritoneal signs.  Musculoskeletal: M/S 5/5 throughout.  No deformity or atrophy.  Neurologic: CN 2-12 intact. Symmetrical.  Speech is fluent. Motor exam as listed above. Psychiatric: Judgment intact, Mood & affect appropriate for pt's clinical situation. Dermatologic: No rashes or ulcers noted.  No changes consistent with cellulitis.  CBC Lab Results  Component Value Date   WBC 10.6 03/07/2021   HGB 10.9 (L) 03/07/2021   HCT 32.8 (L) 03/07/2021   MCV 89.9 03/07/2021   PLT 269 03/07/2021    BMET    Component Value Date/Time   NA 142 03/07/2021 1538   NA 138 01/31/2016 1115   K 4.4 03/07/2021 1538   CL 105 03/07/2021 1538   CO2 30 03/07/2021 1538   GLUCOSE 177 (H) 03/07/2021 1538   BUN 22 03/07/2021 1538   BUN 10 01/31/2016 1115   CREATININE 1.41 (H) 03/07/2021 1538   CALCIUM 9.3 03/07/2021 1538   GFRNONAA 38 (L) 03/07/2021 1538   GFRAA 45 (L) 03/07/2021 1538   Estimated Creatinine Clearance: 43.8 mL/min (A) (by C-G formula based on SCr of 1.41 mg/dL (H)).  COAG Lab Results  Component Value Date   INR 1.1 04/21/2009    Radiology No results found.   Assessment/Plan 1. Bilateral carotid artery stenosis Recommend:   Given the patient's asymptomatic subcritical stenosis no further invasive testing or surgery at this time.   Duplex Ultrasound of the RICA 01-02% and the LICA <72%, no change from study 03/28/2020   Continue antiplatelet  therapy as prescribed Continue management of CAD, HTN and Hyperlipidemia Healthy heart diet,  encouraged exercise at least 4 times per week Follow up in 12 months with duplex ultrasound and physical exam    - VAS US CAROTID; Future  2. Hypertension, benign Continue antihypertensive medications as already ordered, these medications have been reviewed and there are no changes at this time.   3. Type 2 diabetes, uncontrolled, with neuropathy ([place]) Continue hypoglycemic medications as already ordered, these medications have been reviewed and there are no changes at this time.  4. Dyslipidemia Hgb A1C to be monitored as already arranged by primary service Continue statin as ordered and reviewed, no changes at this time     Hortencia Pilar, MD  03/27/2021 3:49 PM

## 2021-03-28 ENCOUNTER — Other Ambulatory Visit: Payer: Self-pay | Admitting: Family Medicine

## 2021-03-28 DIAGNOSIS — Z1231 Encounter for screening mammogram for malignant neoplasm of breast: Secondary | ICD-10-CM

## 2021-04-04 ENCOUNTER — Ambulatory Visit
Admission: RE | Admit: 2021-04-04 | Discharge: 2021-04-04 | Disposition: A | Discharge status: Home / Self Care | Payer: Medicare Other | Source: Ambulatory Visit | Attending: Family Medicine | Admitting: Family Medicine

## 2021-04-04 ENCOUNTER — Other Ambulatory Visit: Payer: Self-pay

## 2021-04-04 DIAGNOSIS — Z1231 Encounter for screening mammogram for malignant neoplasm of breast: Secondary | ICD-10-CM | POA: Insufficient documentation

## 2021-05-23 ENCOUNTER — Other Ambulatory Visit: Payer: Self-pay | Admitting: Family Medicine

## 2021-05-23 DIAGNOSIS — E1169 Type 2 diabetes mellitus with other specified complication: Secondary | ICD-10-CM

## 2021-05-23 DIAGNOSIS — E1129 Type 2 diabetes mellitus with other diabetic kidney complication: Secondary | ICD-10-CM

## 2021-05-29 LAB — HM DIABETES EYE EXAM

## 2021-06-09 ENCOUNTER — Telehealth: Payer: Self-pay | Admitting: Family Medicine

## 2021-06-09 NOTE — Telephone Encounter (Signed)
Copied from [place] [id number]. Topic: Medicare AWV >> Jun 09, 2021  9:55 AM Cher Nakai R wrote: Reason for CRM:   Left message for patient to call back and schedule Medicare Annual Wellness Visit (AWV) in office.   If unable to come into the office for AWV,  please offer to do virtually or by telephone.  No hx of AWV eligible for AWVI as of 06/08/2021  Please schedule at anytime with [place].      40 Minutes appointment   Any questions, please call me at [phone]

## 2021-06-30 ENCOUNTER — Other Ambulatory Visit: Payer: Self-pay | Admitting: Family Medicine

## 2021-06-30 DIAGNOSIS — Z794 Long term (current) use of insulin: Secondary | ICD-10-CM

## 2021-06-30 DIAGNOSIS — E785 Hyperlipidemia, unspecified: Secondary | ICD-10-CM

## 2021-06-30 DIAGNOSIS — E1169 Type 2 diabetes mellitus with other specified complication: Secondary | ICD-10-CM

## 2021-07-03 ENCOUNTER — Telehealth: Payer: Self-pay | Admitting: Family Medicine

## 2021-07-03 NOTE — Telephone Encounter (Signed)
Copied from [place] [phone]. Topic: Medicare AWV >> Jul 03, 2021 10:51 AM Cher Nakai R wrote: Reason for CRM:   Left message for patient to call back and schedule Medicare Annual Wellness Visit (AWV) in office.   If unable to come into the office for AWV,  please offer to do virtually or by telephone.  No hx of AWV eligible for AWVI as of 06/08/2021  Please schedule at anytime with [place].      40 Minutes appointment   Any questions, please call me at [phone]

## 2021-07-05 ENCOUNTER — Ambulatory Visit (INDEPENDENT_AMBULATORY_CARE_PROVIDER_SITE_OTHER): Payer: Medicare Other | Admitting: Family Medicine

## 2021-07-05 ENCOUNTER — Encounter: Payer: Self-pay | Admitting: Family Medicine

## 2021-07-05 ENCOUNTER — Other Ambulatory Visit: Payer: Self-pay

## 2021-07-05 VITALS — BP 134/82 | HR 87 | Temp 98.4°F | Resp 16 | Ht 64.0 in | Wt 216.0 lb

## 2021-07-05 DIAGNOSIS — E785 Hyperlipidemia, unspecified: Secondary | ICD-10-CM

## 2021-07-05 DIAGNOSIS — I152 Hypertension secondary to endocrine disorders: Secondary | ICD-10-CM

## 2021-07-05 DIAGNOSIS — I10 Essential (primary) hypertension: Secondary | ICD-10-CM | POA: Diagnosis not present

## 2021-07-05 DIAGNOSIS — E1159 Type 2 diabetes mellitus with other circulatory complications: Secondary | ICD-10-CM

## 2021-07-05 DIAGNOSIS — I6930 Unspecified sequelae of cerebral infarction: Secondary | ICD-10-CM

## 2021-07-05 DIAGNOSIS — G4733 Obstructive sleep apnea (adult) (pediatric): Secondary | ICD-10-CM

## 2021-07-05 DIAGNOSIS — I6523 Occlusion and stenosis of bilateral carotid arteries: Secondary | ICD-10-CM | POA: Diagnosis not present

## 2021-07-05 DIAGNOSIS — E1169 Type 2 diabetes mellitus with other specified complication: Secondary | ICD-10-CM

## 2021-07-05 DIAGNOSIS — E1129 Type 2 diabetes mellitus with other diabetic kidney complication: Secondary | ICD-10-CM

## 2021-07-05 DIAGNOSIS — R809 Proteinuria, unspecified: Secondary | ICD-10-CM | POA: Diagnosis not present

## 2021-07-05 DIAGNOSIS — Z23 Encounter for immunization: Secondary | ICD-10-CM

## 2021-07-05 DIAGNOSIS — Z794 Long term (current) use of insulin: Secondary | ICD-10-CM | POA: Diagnosis not present

## 2021-07-05 LAB — POCT GLYCOSYLATED HEMOGLOBIN (HGB A1C): Hemoglobin A1C: 7.7 % — AB (ref 4.0–5.6)

## 2021-07-05 MED ORDER — METFORMIN HCL ER 750 MG PO TB24: 1500.0000 mg | ORAL_TABLET | Freq: Every day | ORAL | 1 refills | Status: DC

## 2021-07-05 NOTE — Progress Notes (Signed)
Name: Meagan [name]   MRN: [id number]    DOB: December 03, 1950   Date:07/05/2021       Progress Note  Subjective  Chief Complaint  Follow Up  HPI  DMII: she states since she stopped Ozempic due to significant weight loss. She is currently taking Rybelsus 14 mg , A1C went from  6.9 % to 7.1 %, but today is up to 7.7 % .She also takes Levemir up to 30 units  She is ea, we will try to resume Metformin.She is eating pastry for breakfast, snacking late at night, having ice crease. She is resuming her bad habits. She has dyslipidemia, HTN and microalbuminuria. FSBS at home has been above 200  Explained importance of resuming a healthier diet    HTN: bp at home has been under control at home and also today during her visit No headaches, dizziness, chest pain or palpitation. She is on Benicar hctz 40/25, carvedilol BID and Norvasc, hydralazine is only prn Continue current regiment.   S/p CVA: date of CVA was 07/01/2019, admitted from 07/01/2019 until 07/08/2019 and had inpatient rehab until 07/17/2019. She developed right occipital headaches and bp was spiking . She went to eye doctor and bp was over 200's , he called me and we advised to send her to [facility], she [facility] with her daughter. CTA showed 45 % stenosis proximal right internal carotid artery , 60 % stenosis right external carotid artery , left distal common external carotid was 30 % , echo showed normal EF, LDL was very high at 199 , she is now on Atorvastatin and  Plavix 75 mg . She gained some of her vision back, but still has some  peripheral vision loss on both sides, but worse on left side. She lives with her sister and needs assistance cooking, still not allowed to drive. She sees a retina sub-specialist and is still legally blind   OSA: seen by Dr. Humphrey Rolls and had sleep study, wearing cpap every night, tolerating it well. Continue CPAP machine    Morbid obesity: she has a BMI above 35 and multiple co-morbidities, she gained a couple of pounds  since last visit . She has been snacking on unhealthy snacks again , reminded her again to resume a healthier diet   Patient Active Problem List   Diagnosis Date Noted   History of CVA with residual deficit 07/01/2019   Leukocytosis 07/01/2019   Polycythemia 07/01/2019   Morbid obesity ([place]) 10/22/2018   Non compliance with medical treatment 03/26/2018   Dyslipidemia associated with type 2 diabetes mellitus ([place]) 12/04/2016   Uncontrolled type 2 diabetes mellitus with microalbuminuria, without long-term current use of insulin ([place]) 12/04/2016   Seasonal allergies 02/01/2016   OSA (obstructive sleep apnea) 10/06/2015   Hypertensive retinopathy 10/06/2015   Thyroid cyst 10/06/2015   Dyslipidemia 10/06/2015   Type 2 diabetes, uncontrolled, with neuropathy ([place]) 10/06/2015   History of pneumonia 10/06/2015   Chronic neck pain 10/06/2015   Carotid stenosis    Hypertension, benign     Past Surgical History:  Procedure Laterality Date   APPENDECTOMY  2000s   BIOPSY THYROID  2006   [facility]   BREAST EXCISIONAL BIOPSY Left 2000s   neg   CARPAL TUNNEL RELEASE Left March 2015   COLONOSCOPY  2011   DR.W[facility]L   COLONOSCOPY WITH PROPOFOL N/A 07/11/2017   Procedure: COLONOSCOPY WITH PROPOFOL;  Surgeon: Jonathon Bellows, MD;  Location: [facility] ENDOSCOPY;  Service: Gastroenterology;  Laterality: N/A;   ganglion cyst  removal      KNEE SURGERY Left 08/2009   KNEE SURGERY Right 04/07/2010   REFRACTIVE SURGERY Bilateral    ROTATOR CUFF REPAIR Right 2008   TUBAL LIGATION      Family History  Problem Relation Age of Onset   Breast cancer Maternal Aunt    Hypertension Mother    Kidney Stones Mother    Hypertension Father    Congestive Heart Failure Father    Hypertension Paternal Grandfather    Diabetes Sister    Vision loss Sister    Varicose Veins Sister    Hypertension Sister    Breast cancer Sister 31       Double Mastectomy   Hypertension Sister     Social History   Tobacco Use   Smoking  status: Never   Smokeless tobacco: Never  Substance Use Topics   Alcohol use: No     Current Outpatient Medications:    amLODipine (NORVASC) 5 MG tablet, Take 1 tablet (5 mg total) by mouth daily., Disp: 90 tablet, Rfl: 1   atorvastatin (LIPITOR) 80 MG tablet, TAKE 1 TABLET (80 MG TOTAL) BY MOUTH DAILY AT 6 PM., Disp: 90 tablet, Rfl: 2   BD PEN NEEDLE NANO 2ND GEN 32G X 4 MM MISC, USE AS DIRECTED DAILY, Disp: 100 each, Rfl: 11   blood glucose meter kit and supplies, Dispense based on patient and insurance preference. Use up to four times daily as directed. (FOR ICD-10 E10.9, E11.9)., Disp: 1 each, Rfl: 0   carvedilol (COREG) 6.25 MG tablet, Take 1 tablet (6.25 mg total) by mouth 2 (two) times daily with a meal., Disp: 180 tablet, Rfl: 1   clopidogrel (PLAVIX) 75 MG tablet, Take 1 tablet (75 mg total) by mouth daily., Disp: 90 tablet, Rfl: 1   docusate sodium (COLACE) 100 MG capsule, Take 1 capsule (100 mg total) by mouth daily as needed for mild constipation., Disp: 60 capsule, Rfl: 0   hydrALAZINE (APRESOLINE) 10 MG tablet, Take 1 tablet (10 mg total) by mouth 3 (three) times daily as needed. If bp goes above 150/90, Disp: 30 tablet, Rfl: 0   LEVEMIR FLEXTOUCH 100 UNIT/ML FlexTouch Pen, INJECT 24-50 UNITS INTO THE SKIN DAILY AFTER BREAKFAST., Disp: 15 mL, Rfl: 0   levocetirizine (XYZAL) 5 MG tablet, Take 5 mg by mouth daily as needed for allergies. , Disp: , Rfl:    olmesartan-hydrochlorothiazide (BENICAR HCT) 40-25 MG tablet, Take 1 tablet by mouth daily., Disp: 90 tablet, Rfl: 1   ONETOUCH ULTRA test strip, USE UP TO 4 TIMES DAILY AS DIRECTED, Disp: 200 strip, Rfl: 2   polyethylene glycol (MIRALAX / GLYCOLAX) 17 g packet, Take 17 g by mouth daily., Disp: 14 each, Rfl: 0   Semaglutide (RYBELSUS) 14 MG TABS, Take 1 tablet by mouth daily., Disp: 90 tablet, Rfl: 1   SENNA-PLUS 8.6-50 MG tablet, Take 2 tablets by mouth at bedtime., Disp: , Rfl:   Allergies  Allergen Reactions   Amoxicillin      rash   Bee Venom Swelling and Other (See Comments)    Immediately takes Benadryl to counteract   [place] [Dapagliflozin] Rash    Pt reports that she developed a rash when she took [place] and she would like it to be added as an allergy   Keflex [Cephalexin] Rash    I personally reviewed active problem list, medication list, allergies, family history, social history, health maintenance with the patient/caregiver today.   ROS  Constitutional: Negative for fever or  weight change.  Respiratory: Negative for cough and shortness of breath.   Cardiovascular: Negative for chest pain or palpitations.  Gastrointestinal: Negative for abdominal pain, no bowel changes.  Musculoskeletal: positive for gait problem ( due to vision deficit ) but no  joint swelling.  Skin: Negative for rash.  Neurological: Negative for dizziness or headache.  No other specific complaints in a complete review of systems (except as listed in HPI above).   Objective  Vitals:   07/05/21 1417  BP: 134/82  Pulse: 87  Resp: 16  Temp: 98.4 F (36.9 C)  SpO2: 96%  Weight: 216 lb (98 kg)  Height: _0  (1.626 m)    Body mass index is 37.08 kg/m.  Physical Exam  Constitutional: Patient appears well-developed and well-nourished. Obese  No distress.  HEENT: head atraumatic, normocephalic, pupils equal and reactive to light, neck supple Cardiovascular: Normal rate, regular rhythm and normal heart sounds.  No murmur heard. No BLE edema. Pulmonary/Chest: Effort normal and breath sounds normal. No respiratory distress. Abdominal: Soft.  There is no tenderness. Psychiatric: Patient has a normal mood and affect. behavior is normal. Judgment and thought content normal.   Recent Results (from the past 2160 hour(s))  HM DIABETES EYE EXAM     Status: Abnormal   Collection Time: 05/29/21 12:00 AM  Result Value Ref Range   HM Diabetic Eye Exam Retinopathy (A) No Retinopathy    Comment: Dr. Wardell Heath in chart  POCT  HgB A1C     Status: Abnormal   Collection Time: 07/05/21  2:38 PM  Result Value Ref Range   Hemoglobin A1C 7.7 (A) 4.0 - 5.6 %   HbA1c POC (<> result, manual entry)     HbA1c, POC (prediabetic range)     HbA1c, POC (controlled diabetic range)      PHQ2/9: Depression screen [facility] 2/9 07/05/2021 03/07/2021 11/04/2020 08/29/2020 07/01/2020  Decreased Interest 0 0 0 0 0  Down, Depressed, Hopeless 0 0 0 0 0  PHQ - 2 Score 0 0 0 0 0  Altered sleeping - - - - -  Tired, decreased energy - - - - -  Change in appetite - - - - -  Feeling bad or failure about yourself  - - - - -  Trouble concentrating - - - - -  Moving slowly or fidgety/restless - - - - -  Suicidal thoughts - - - - -  PHQ-9 Score - - - - -  Difficult doing work/chores - - - - -  Some recent data might be hidden    phq 9 is negative   Fall Risk: Fall Risk  07/05/2021 03/07/2021 11/04/2020 08/29/2020 07/01/2020  Falls in the past year? 0 0 0 0 0  Number falls in past yr: 0 0 0 0 0  Injury with Fall? 0 0 0 0 0  Risk for fall due to : No Fall Risks - - Impaired balance/gait -  Follow up Falls prevention discussed - - - -      Functional Status Survey: Is the patient deaf or have difficulty hearing?: No Does the patient have difficulty seeing, even when wearing glasses/contacts?: Yes Does the patient have difficulty concentrating, remembering, or making decisions?: No Does the patient have difficulty walking or climbing stairs?: Yes Does the patient have difficulty dressing or bathing?: Yes Does the patient have difficulty doing errands alone such as visiting a doctor's office or shopping?: No    Assessment & Plan  1. Type 2 diabetes  mellitus with microalbuminuria, with long-term current use of insulin (H[facility])  - POCT HgB A1C  We will resume Metformin 1500 mg daily   2. OSA (obstructive sleep apnea)  Continue CPAP use  3. Morbid obesity ([place])  Discussed with the patient the risk posed by an increased BMI. Discussed  importance of portion control, calorie counting and at least 150 minutes of physical activity weekly. Avoid sweet beverages and drink more water. Eat at least 6 servings of fruit and vegetables daily    4. Hypertension, benign  Bp is at goal   5. Dyslipidemia associated with type 2 diabetes mellitus ([place])  Continue statin therapy   6. History of CVA with residual deficit  Legally blind   7. Hypertension associated with diabetes ([place])  Bp is at goal   8. Carotid stenosis, bilateral  Taking statin therapy   9. Needs flu shot  - Flu vaccine HIGH DOSE PF (Fluzone High dose)

## 2021-07-26 ENCOUNTER — Other Ambulatory Visit: Payer: Self-pay

## 2021-07-26 ENCOUNTER — Ambulatory Visit (INDEPENDENT_AMBULATORY_CARE_PROVIDER_SITE_OTHER): Payer: Medicare Other

## 2021-07-26 DIAGNOSIS — G4733 Obstructive sleep apnea (adult) (pediatric): Secondary | ICD-10-CM

## 2021-07-26 NOTE — Progress Notes (Signed)
95 percentile pressure 13.8   95th percentile leak 24.8   apnea index 1.6 /hr  apnea-hypopnea index  2.2 /hr   total days used  >4 hr 83 days  total days used <4 hr 5 days  Total compliance 92 percent  She is doing great no problems or questions at this time.  Pt was seen by Claiborne Billings from [facility]

## 2021-08-07 ENCOUNTER — Other Ambulatory Visit: Payer: Self-pay | Admitting: Family Medicine

## 2021-09-12 ENCOUNTER — Other Ambulatory Visit: Payer: Self-pay | Admitting: Family Medicine

## 2021-09-12 DIAGNOSIS — E1169 Type 2 diabetes mellitus with other specified complication: Secondary | ICD-10-CM

## 2021-09-12 DIAGNOSIS — E1129 Type 2 diabetes mellitus with other diabetic kidney complication: Secondary | ICD-10-CM

## 2021-09-12 DIAGNOSIS — R809 Proteinuria, unspecified: Secondary | ICD-10-CM

## 2021-09-19 ENCOUNTER — Ambulatory Visit (INDEPENDENT_AMBULATORY_CARE_PROVIDER_SITE_OTHER): Payer: Medicare Other | Admitting: Nurse Practitioner

## 2021-09-19 ENCOUNTER — Encounter: Payer: Self-pay | Admitting: Nurse Practitioner

## 2021-09-19 ENCOUNTER — Other Ambulatory Visit: Payer: Self-pay

## 2021-09-19 VITALS — BP 138/73 | HR 77 | Temp 98.3°F | Resp 16 | Ht 64.0 in | Wt 219.8 lb

## 2021-09-19 DIAGNOSIS — I6523 Occlusion and stenosis of bilateral carotid arteries: Secondary | ICD-10-CM

## 2021-09-19 DIAGNOSIS — G4733 Obstructive sleep apnea (adult) (pediatric): Secondary | ICD-10-CM | POA: Diagnosis not present

## 2021-09-19 DIAGNOSIS — Z7189 Other specified counseling: Secondary | ICD-10-CM | POA: Diagnosis not present

## 2021-09-19 DIAGNOSIS — Z9989 Dependence on other enabling machines and devices: Secondary | ICD-10-CM

## 2021-09-19 NOTE — Progress Notes (Signed)
[facility] [place], [place] [address]  Internal MEDICINE  Office Visit Note  Patient Name: Meagan [name]  [id number]  [id number]  Date of Service: 09/19/2021  Chief Complaint  Patient presents with   Follow-up    HPI  Meagan [name] presents for a follow up visit for OSA and CPAP use. She reports that she is doing well with the CPAP machine and her sleep is restful. She is not sleeping during the day and does not wake up feeling tired. CPAP download shown below:  95 percentile pressure 13.8  95th percentile leak 24.8   apnea index 1.6 /hr  apnea-hypopnea index  2.2 /hr  total days used  >4 hr 83 days  total days used <4 hr 5 days Total compliance 92 percent  She is doing great no problems or questions at this time.  Pt was seen by Claiborne Billings from [facility]  She does need supplies, order placed.     Current Medication: Outpatient Encounter Medications as of 09/19/2021  Medication Sig Note   amLODipine (NORVASC) 5 MG tablet Take 1 tablet (5 mg total) by mouth daily.    atorvastatin (LIPITOR) 80 MG tablet TAKE 1 TABLET (80 MG TOTAL) BY MOUTH DAILY AT 6 PM.    BD PEN NEEDLE NANO 2ND GEN 32G X 4 MM MISC USE AS DIRECTED DAILY    blood glucose meter kit and supplies Dispense based on patient and insurance preference. Use up to four times daily as directed. (FOR ICD-10 E10.9, E11.9).    carvedilol (COREG) 6.25 MG tablet Take 1 tablet (6.25 mg total) by mouth 2 (two) times daily with a meal.    docusate sodium (COLACE) 100 MG capsule Take 1 capsule (100 mg total) by mouth daily as needed for mild constipation.    hydrALAZINE (APRESOLINE) 10 MG tablet Take 1 tablet (10 mg total) by mouth 3 (three) times daily as needed. If bp goes above 150/90    LEVEMIR FLEXTOUCH 100 UNIT/ML FlexTouch Pen INJECT 24-50 UNITS INTO THE SKIN DAILY AFTER BREAKFAST.    levocetirizine (XYZAL) 5 MG tablet Take 5 mg by mouth daily as needed for allergies.     olmesartan-hydrochlorothiazide (BENICAR  HCT) 40-25 MG tablet Take 1 tablet by mouth daily.    ONETOUCH ULTRA test strip USE UP TO 4 TIMES DAILY AS DIRECTED    polyethylene glycol (MIRALAX / GLYCOLAX) 17 g packet Take 17 g by mouth daily.    SENNA-PLUS 8.6-50 MG tablet Take 2 tablets by mouth at bedtime. 11/04/2020: Takes one tablet in the am and one at bedtime    [DISCONTINUED] clopidogrel (PLAVIX) 75 MG tablet Take 1 tablet (75 mg total) by mouth daily.    [DISCONTINUED] metFORMIN (GLUCOPHAGE XR) 750 MG 24 hr tablet Take 2 tablets (1,500 mg total) by mouth daily with breakfast.    [DISCONTINUED] Semaglutide (RYBELSUS) 14 MG TABS Take 1 tablet by mouth daily.    No facility-administered encounter medications on file as of 09/19/2021.    Surgical History: Past Surgical History:  Procedure Laterality Date   APPENDECTOMY  2000s   BIOPSY THYROID  2006   [facility]   BREAST EXCISIONAL BIOPSY Left 2000s   neg   CARPAL TUNNEL RELEASE Left March 2015   COLONOSCOPY  2011   DR.WOHL   COLONOSCOPY WITH PROPOFOL N/A 07/11/2017   Procedure: COLONOSCOPY WITH PROPOFOL;  Surgeon: Jonathon Bellows, MD;  Location: [facility] ENDOSCOPY;  Service: Gastroenterology;  Laterality: N/A;   ganglion cyst removal  KNEE SURGERY Left 08/2009   KNEE SURGERY Right 04/07/2010   REFRACTIVE SURGERY Bilateral    ROTATOR CUFF REPAIR Right 2008   TUBAL LIGATION      Medical History: Past Medical History:  Diagnosis Date   Cellulitis    left leg   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   Diffuse cystic mastopathy    Fall 2013   Hypertension 2004   Occlusion and stenosis of carotid artery without mention of cerebral infarction    Sleep apnea    Stroke [facility])     Family History: Family History  Problem Relation Age of Onset   Breast cancer Maternal Aunt    Hypertension Mother    Kidney Stones Mother    Hypertension Father    Congestive Heart Failure Father    Hypertension Paternal Grandfather    Diabetes Sister    Vision loss  Sister    Varicose Veins Sister    Hypertension Sister    Breast cancer Sister 11       Double Mastectomy   Hypertension Sister     Social History   Socioeconomic History   Marital status: Widowed    Spouse name: Not on file   Number of children: 4   Years of education: Not on file   Highest education level: Master's degree (e.g., MA, MS, MEng, MEd, MSW, MBA)  Occupational History   Occupation: Higher education careers adviser   Tobacco Use   Smoking status: Never   Smokeless tobacco: Never  Vaping Use   Vaping Use: Never used  Substance and Sexual Activity   Alcohol use: No   Drug use: No   Sexual activity: Not Currently    Partners: Male    Comment: Widow for 14 years  Other Topics Concern   Not on file  Social History Narrative   She had a stroke on 06/2019, she has left peripheral vision loss since stroke   She moved in with her youngest sister    Social Determinants of Radio broadcast assistant Strain: Not on file  Food Insecurity: Not on file  Transportation Needs: Not on file  Physical Activity: Not on file  Stress: Not on file  Social Connections: Not on file  Intimate Partner Violence: Not on file      Review of Systems  Constitutional:  Negative for chills, fatigue and unexpected weight change.  HENT:  Negative for congestion, rhinorrhea, sneezing and sore throat.   Eyes:  Negative for redness.  Respiratory:  Negative for cough, chest tightness and shortness of breath.   Cardiovascular:  Negative for chest pain and palpitations.  Gastrointestinal:  Negative for abdominal pain, constipation, diarrhea, nausea and vomiting.  Genitourinary:  Negative for dysuria and frequency.  Musculoskeletal:  Negative for arthralgias, back pain, joint swelling and neck pain.  Skin:  Negative for rash.  Neurological: Negative.  Negative for tremors and numbness.  Hematological:  Negative for adenopathy. Does not bruise/bleed easily.  Psychiatric/Behavioral:  Negative for behavioral  problems (Depression), sleep disturbance and suicidal ideas. The patient is not nervous/anxious.    Vital Signs: BP 138/73    Pulse 77    Temp 98.3 F (36.8 C)    Resp 16    Ht _0  (1.626 m)    Wt 219 lb 12.8 oz (99.7 kg)    SpO2 97%    BMI 37.73 kg/m    Physical Exam Vitals reviewed.  Constitutional:      General: She is  not in acute distress.    Appearance: Normal appearance. She is obese. She is not ill-appearing.  HENT:     Head: Normocephalic and atraumatic.  Eyes:     Pupils: Pupils are equal, round, and reactive to light.  Cardiovascular:     Rate and Rhythm: Normal rate and regular rhythm.  Pulmonary:     Effort: Pulmonary effort is normal. No respiratory distress.  Neurological:     Mental Status: She is alert and oriented to person, place, and time.     Cranial Nerves: No cranial nerve deficit.  Psychiatric:        Mood and Affect: Mood normal.        Behavior: Behavior normal.       Assessment/Plan: 1. OSA on CPAP Doing well, continue to use CPAP as instructed, supplies ordered.  - For home use only DME continuous positive airway pressure (CPAP)  2. CPAP use counseling Patient has no questions or concerns regarding routine maintenance and cleaning of CPAP machine.  - For home use only DME continuous positive airway pressure (CPAP)   General Counseling: Meagan [name] verbalizes understanding of the findings of todays visit and agrees with plan of treatment. I have discussed any further diagnostic evaluation that may be needed or ordered today. We also reviewed her medications today. she has been encouraged to call the office with any questions or concerns that should arise related to todays visit.    Orders Placed This Encounter  Procedures   For home use only DME continuous positive airway pressure (CPAP)    No orders of the defined types were placed in this encounter.   Return in about 5 months (around 02/17/2022) for F/U, pulmonary/sleep, Karema Tocci or  DSK.   Total time spent:30 Minutes Time spent includes review of chart, medications, test results, and follow up plan with the patient.   [place] Controlled Substance Database was reviewed by me.  This patient was seen by Jonetta Osgood, FNP-C in collaboration with Dr. Clayborn Bigness as a part of collaborative care agreement.   Julia Kulzer R. Valetta Fuller, MSN, FNP-C Internal medicine

## 2021-09-29 ENCOUNTER — Other Ambulatory Visit: Payer: Self-pay | Admitting: Family Medicine

## 2021-09-29 DIAGNOSIS — E1129 Type 2 diabetes mellitus with other diabetic kidney complication: Secondary | ICD-10-CM

## 2021-09-29 NOTE — Telephone Encounter (Signed)
Requested medication (s) are due for refill today: yes  Requested medication (s) are on the active medication list: yes  Last refill:  07/03/21  Future visit scheduled: 11/06/21  Notes to clinic:  No protocol to evaluate this med, please assess.   Requested Prescriptions  Pending Prescriptions Disp Refills   RYBELSUS 14 MG TABS [Pharmacy Med Name: RYBELSUS 14 MG TABLET] 90 tablet 1    Sig: TAKE 1 TABLET BY MOUTH EVERY DAY     Off-Protocol Failed - 09/29/2021  1:39 AM      Failed - Medication not assigned to a protocol, review manually.      Passed - Valid encounter within last 12 months    Recent Outpatient Visits           2 months ago Type 2 diabetes mellitus with microalbuminuria, with long-term current use of insulin [facility])   [facility] [place], Drue Stager, MD   6 months ago Type 2 diabetes mellitus with microalbuminuria, with long-term current use of insulin [facility])   [facility] [place], Drue Stager, MD   10 months ago Type 2 diabetes mellitus with microalbuminuria, with long-term current use of insulin [facility])   [facility] [place], Drue Stager, MD   1 year ago Uncontrolled type 2 diabetes mellitus with microalbuminuria, without long-term current use of insulin [facility])   [facility] [place], Drue Stager, MD   1 year ago Uncontrolled type 2 diabetes mellitus with microalbuminuria, without long-term current use of insulin [facility])   [facility] Steele Sizer, MD       Future Appointments             In 1 month Ancil Boozer, Drue Stager, MD [facility], [facility]

## 2021-10-03 ENCOUNTER — Encounter: Payer: Self-pay | Admitting: Internal Medicine

## 2021-10-11 ENCOUNTER — Other Ambulatory Visit: Payer: Self-pay | Admitting: Family Medicine

## 2021-10-11 DIAGNOSIS — Z794 Long term (current) use of insulin: Secondary | ICD-10-CM

## 2021-10-11 DIAGNOSIS — E1129 Type 2 diabetes mellitus with other diabetic kidney complication: Secondary | ICD-10-CM

## 2021-10-16 ENCOUNTER — Other Ambulatory Visit: Payer: Self-pay | Admitting: Family Medicine

## 2021-10-16 DIAGNOSIS — I6930 Unspecified sequelae of cerebral infarction: Secondary | ICD-10-CM

## 2021-10-22 ENCOUNTER — Encounter: Payer: Self-pay | Admitting: Nurse Practitioner

## 2021-10-23 ENCOUNTER — Other Ambulatory Visit: Payer: Self-pay | Admitting: Family Medicine

## 2021-10-23 DIAGNOSIS — E1169 Type 2 diabetes mellitus with other specified complication: Secondary | ICD-10-CM

## 2021-10-23 DIAGNOSIS — E1129 Type 2 diabetes mellitus with other diabetic kidney complication: Secondary | ICD-10-CM

## 2021-10-24 ENCOUNTER — Other Ambulatory Visit: Payer: Self-pay | Admitting: Family Medicine

## 2021-10-24 DIAGNOSIS — I10 Essential (primary) hypertension: Secondary | ICD-10-CM

## 2021-10-27 ENCOUNTER — Other Ambulatory Visit: Payer: Self-pay | Admitting: Family Medicine

## 2021-10-27 DIAGNOSIS — I6930 Unspecified sequelae of cerebral infarction: Secondary | ICD-10-CM

## 2021-10-27 NOTE — Telephone Encounter (Signed)
Requested Prescriptions  Pending Prescriptions Disp Refills   atorvastatin (LIPITOR) 80 MG tablet [Pharmacy Med Name: ATORVASTATIN 80 MG TABLET] 90 tablet 1    Sig: TAKE 1 TABLET BY MOUTH DAILY AT 6 PM.     Cardiovascular:  Antilipid - Statins Failed - 10/27/2021  1:26 AM      Failed - HDL in normal range and within 360 days    HDL  Date Value Ref Range Status  03/07/2021 33 (L) > OR = 50 mg/dL Final  01/31/2016 34 (L) >39 mg/dL Final         Passed - Total Cholesterol in normal range and within 360 days    Cholesterol, Total  Date Value Ref Range Status  01/31/2016 200 (H) 100 - 199 mg/dL Final   Cholesterol  Date Value Ref Range Status  03/07/2021 91 <200 mg/dL Final         Passed - LDL in normal range and within 360 days    LDL Cholesterol (Calc)  Date Value Ref Range Status  03/07/2021 37 mg/dL (calc) Final    Comment:    Reference range: <100 . Desirable range <100 mg/dL for primary prevention;   <70 mg/dL for patients with CHD or diabetic patients  with > or = 2 CHD risk factors. Marland Kitchen LDL-C is now calculated using the Martin-Hopkins  calculation, which is a validated novel method providing  better accuracy than the Friedewald equation in the  estimation of LDL-C.  Cresenciano Genre et al. Annamaria Helling. 0737;106(26): 2061-2068  (http://education.[employer].com/faq/FAQ164)          Passed - Triglycerides in normal range and within 360 days    Triglycerides  Date Value Ref Range Status  03/07/2021 126 <150 mg/dL Final         Passed - Patient is not pregnant      Passed - Valid encounter within last 12 months    Recent Outpatient Visits          3 months ago Type 2 diabetes mellitus with microalbuminuria, with long-term current use of insulin [facility])   [facility] [place], Drue Stager, MD   7 months ago Type 2 diabetes mellitus with microalbuminuria, with long-term current use of insulin [facility])   [facility] [place], Drue Stager, MD   11  months ago Type 2 diabetes mellitus with microalbuminuria, with long-term current use of insulin [facility])   [facility] [place], Drue Stager, MD   1 year ago Uncontrolled type 2 diabetes mellitus with microalbuminuria, without long-term current use of insulin [facility])   [facility] [place], Drue Stager, MD   1 year ago Uncontrolled type 2 diabetes mellitus with microalbuminuria, without long-term current use of insulin [facility])   [facility] Steele Sizer, MD      Future Appointments            In 1 week Steele Sizer, MD [facility], [facility]

## 2021-11-03 NOTE — Progress Notes (Signed)
Name: Meagan [name]   MRN: [id number]    DOB: 13-Mar-1951   Date:11/06/2021       Progress Note  Subjective  Chief Complaint  Follow Up  HPI  DMII: she states since she stopped Ozempic due to significant weight loss. She is currently taking Rybelsus 14 mg , A1C went from  6.9 % to 7.1 %, it went up to 7.7 , she is now on Metformin, unable to take 2 daily but taking one daily , changed her diet and A1C is down to 6.6 % .She also takes Levemir  but down to 26 units per day She has dyslipidemia, HTN and microalbuminuria, she is on ARB, statin therapy. FSBS has been well controlled. She is up to date with eye exam. We will recheck labs today    HTN: bp at home has been under control at home and also today during her visit No headaches, dizziness, chest pain or palpitation. She is on Benicar hctz 40/25, carvedilol BID and Norvasc, hydralazine is only prn but her bp is not spiking and has not taken Hydralazine   S/p CVA: date of CVA was 07/01/2019, admitted from 07/01/2019 until 07/08/2019 and had inpatient rehab until 07/17/2019. She developed right occipital headaches and bp was spiking . She went to eye doctor and bp was over 200's , he called me and we advised to send her to [facility], she arrived to [facility] with her daughter. CTA showed 45 % stenosis proximal right internal carotid artery , 60 % stenosis right external carotid artery , left distal common external carotid was 30 % , echo showed normal EF, LDL was very high at 199 , she is now on Atorvastatin and  Plavix 75 mg . She gained some of her vision back, but still has some  peripheral vision loss on both sides, but worse on left side. She lives with her sister and needs assistance cooking, still not allowed to drive. She sees a retina sub-specialist and is still legally blind , recently seen by Dr. Matilde Sprang , vision was blurred and diagnosed with cataracts and will have surgery soon   OSA: seen by Dr. Humphrey Rolls and had sleep study, wearing cpap every night,  tolerating it well. She has been compliant    Morbid obesity: she has a BMI above 35 and multiple co-morbidities, she is trying to lose weight now. She has changed her diet and is doing well at this time  Patient Active Problem List   Diagnosis Date Noted   History of CVA with residual deficit 07/01/2019   Leukocytosis 07/01/2019   Polycythemia 07/01/2019   Morbid obesity ([place]) 10/22/2018   Non compliance with medical treatment 03/26/2018   Dyslipidemia associated with type 2 diabetes mellitus ([place]) 12/04/2016   Uncontrolled type 2 diabetes mellitus with microalbuminuria, without long-term current use of insulin 12/04/2016   Seasonal allergies 02/01/2016   OSA (obstructive sleep apnea) 10/06/2015   Hypertensive retinopathy 10/06/2015   Thyroid cyst 10/06/2015   Dyslipidemia 10/06/2015   Type 2 diabetes, uncontrolled, with neuropathy 10/06/2015   History of pneumonia 10/06/2015   Chronic neck pain 10/06/2015   Carotid stenosis    Hypertension, benign     Past Surgical History:  Procedure Laterality Date   APPENDECTOMY  2000s   BIOPSY THYROID  2006   [facility]   BREAST EXCISIONAL BIOPSY Left 2000s   neg   CARPAL TUNNEL RELEASE Left March 2015   COLONOSCOPY  2011   DR.WOHL   COLONOSCOPY WITH PROPOFOL N/A  07/11/2017   Procedure: COLONOSCOPY WITH PROPOFOL;  Surgeon: Jonathon Bellows, MD;  Location: [facility] ENDOSCOPY;  Service: Gastroenterology;  Laterality: N/A;   ganglion cyst removal      KNEE SURGERY Left 08/2009   KNEE SURGERY Right 04/07/2010   REFRACTIVE SURGERY Bilateral    ROTATOR CUFF REPAIR Right 2008   TUBAL LIGATION      Family History  Problem Relation Age of Onset   Breast cancer Maternal Aunt    Hypertension Mother    Kidney Stones Mother    Hypertension Father    Congestive Heart Failure Father    Hypertension Paternal Grandfather    Diabetes Sister    Vision loss Sister    Varicose Veins Sister    Hypertension Sister    Breast cancer Sister 41       Double  Mastectomy   Hypertension Sister     Social History   Tobacco Use   Smoking status: Never   Smokeless tobacco: Never  Substance Use Topics   Alcohol use: No     Current Outpatient Medications:    amLODipine (NORVASC) 5 MG tablet, TAKE 1 TABLET (5 MG TOTAL) BY MOUTH DAILY., Disp: 90 tablet, Rfl: 1   atorvastatin (LIPITOR) 80 MG tablet, TAKE 1 TABLET BY MOUTH DAILY AT 6 PM., Disp: 90 tablet, Rfl: 1   BD PEN NEEDLE NANO 2ND GEN 32G X 4 MM MISC, USE AS DIRECTED DAILY, Disp: 100 each, Rfl: 11   blood glucose meter kit and supplies, Dispense based on patient and insurance preference. Use up to four times daily as directed. (FOR ICD-10 E10.9, E11.9)., Disp: 1 each, Rfl: 0   carvedilol (COREG) 6.25 MG tablet, Take 1 tablet (6.25 mg total) by mouth 2 (two) times daily with a meal., Disp: 180 tablet, Rfl: 1   clopidogrel (PLAVIX) 75 MG tablet, TAKE 1 TABLET BY MOUTH EVERY DAY, Disp: 90 tablet, Rfl: 1   docusate sodium (COLACE) 100 MG capsule, Take 1 capsule (100 mg total) by mouth daily as needed for mild constipation., Disp: 60 capsule, Rfl: 0   hydrALAZINE (APRESOLINE) 10 MG tablet, Take 1 tablet (10 mg total) by mouth 3 (three) times daily as needed. If bp goes above 150/90, Disp: 30 tablet, Rfl: 0   LEVEMIR FLEXTOUCH 100 UNIT/ML FlexTouch Pen, INJECT 24-50 UNITS INTO THE SKIN DAILY AFTER BREAKFAST., Disp: 15 mL, Rfl: 0   levocetirizine (XYZAL) 5 MG tablet, Take 5 mg by mouth daily as needed for allergies. , Disp: , Rfl:    metFORMIN (GLUCOPHAGE-XR) 750 MG 24 hr tablet, TAKE 2 TABLETS (1,500 MG TOTAL) BY MOUTH DAILY WITH BREAKFAST., Disp: 180 tablet, Rfl: 1   olmesartan-hydrochlorothiazide (BENICAR HCT) 40-25 MG tablet, TAKE 1 TABLET BY MOUTH EVERY DAY, Disp: 90 tablet, Rfl: 1   ONETOUCH ULTRA test strip, USE UP TO 4 TIMES DAILY AS DIRECTED, Disp: 200 strip, Rfl: 2   polyethylene glycol (MIRALAX / GLYCOLAX) 17 g packet, Take 17 g by mouth daily., Disp: 14 each, Rfl: 0   RYBELSUS 14 MG TABS,  TAKE 1 TABLET BY MOUTH EVERY DAY, Disp: 90 tablet, Rfl: 0   SENNA-PLUS 8.6-50 MG tablet, Take 2 tablets by mouth at bedtime., Disp: , Rfl:   Allergies  Allergen Reactions   Amoxicillin     rash   Bee Venom Swelling and Other (See Comments)    Immediately takes Benadryl to counteract   [place] [Dapagliflozin] Rash    Pt reports that she developed a rash when she took [place]  and she would like it to be added as an allergy   Keflex [Cephalexin] Rash    I personally reviewed active problem list, medication list, allergies, family history, social history, health maintenance with the patient/caregiver today.   ROS  Constitutional: Negative for fever or weight change.  Respiratory: Negative for cough and shortness of breath.   Cardiovascular: Negative for chest pain or palpitations.  Gastrointestinal: Negative for abdominal pain, no bowel changes.  Musculoskeletal: Negative for gait problem or joint swelling.  Skin: Negative for rash.  Neurological: Negative for dizziness or headache.  No other specific complaints in a complete review of systems (except as listed in HPI above).   Objective  Vitals:   11/06/21 1406  BP: 138/82  Pulse: 90  Resp: 16  SpO2: 98%  Weight: 214 lb (97.1 kg)  Height: '5\' 4"'  (1.626 m)    Body mass index is 36.73 kg/m.  Physical Exam  Constitutional: Patient appears well-developed Obese  No distress.  HEENT: head atraumatic, normocephalic, pupils equal and reactive to light,  neck supple Cardiovascular: Normal rate, regular rhythm and normal heart sounds.  No murmur heard. No BLE edema. Pulmonary/Chest: Effort normal and breath sounds normal. No respiratory distress. Abdominal: Soft.  There is no tenderness. Psychiatric: Patient has a normal mood and affect. behavior is normal. Judgment and thought content normal.     PHQ2/9: Depression screen [facility] 2/9 11/06/2021 07/05/2021 03/07/2021 11/04/2020 08/29/2020  Decreased Interest 0 0 0 0 0  Down,  Depressed, Hopeless 0 0 0 0 0  PHQ - 2 Score 0 0 0 0 0  Altered sleeping 0 - - - -  Tired, decreased energy 0 - - - -  Change in appetite 0 - - - -  Feeling bad or failure about yourself  0 - - - -  Trouble concentrating 0 - - - -  Moving slowly or fidgety/restless 0 - - - -  Suicidal thoughts 0 - - - -  PHQ-9 Score 0 - - - -  Difficult doing work/chores - - - - -  Some recent data might be hidden    phq 9 is negative   Fall Risk: Fall Risk  11/06/2021 07/05/2021 03/07/2021 11/04/2020 08/29/2020  Falls in the past year? 0 0 0 0 0  Number falls in past yr: 0 0 0 0 0  Injury with Fall? 0 0 0 0 0  Risk for fall due to : No Fall Risks No Fall Risks - - Impaired balance/gait  Follow up Falls prevention discussed Falls prevention discussed - - -      Functional Status Survey: Is the patient deaf or have difficulty hearing?: No Does the patient have difficulty seeing, even when wearing glasses/contacts?: Yes Does the patient have difficulty concentrating, remembering, or making decisions?: No Does the patient have difficulty walking or climbing stairs?: Yes Does the patient have difficulty dressing or bathing?: No Does the patient have difficulty doing errands alone such as visiting a doctor's office or shopping?: Yes    Assessment & Plan  1. Type 2 diabetes mellitus with microalbuminuria, with long-term current use of insulin (HCC)  - POCT HgB A1C - Microalbumin / creatinine urine ratio - metFORMIN (GLUCOPHAGE-XR) 750 MG 24 hr tablet; Take 1 tablet (750 mg total) by mouth daily with breakfast.  Dispense: 90 tablet; Refill: 0  2. Hypertension associated with diabetes ([place])  - COMPLETE METABOLIC PANEL WITH GFR  3. Dyslipidemia associated with type 2 diabetes mellitus ([place])   4.  Morbid obesity ([place])  Discussed with the patient the risk posed by an increased BMI. Discussed importance of portion control, calorie counting and at least 150 minutes of physical activity weekly.  Avoid sweet beverages and drink more water. Eat at least 6 servings of fruit and vegetables daily    5. Other proteinuria  Recheck level today   6. OSA (obstructive sleep apnea)  Under the care of Dr. Humphrey Rolls   7. Anemia, unspecified type  - CBC with Differential/Platelet - Iron, TIBC and Ferritin Panel  8. Need for shingles vaccine  - Zoster Vaccine Adjuvanted [facility]) injection; Inject 0.5 mLs into the muscle once for 1 dose.  Dispense: 0.5 mL; Refill: 1

## 2021-11-06 ENCOUNTER — Ambulatory Visit: Payer: Medicare PPO | Admitting: Family Medicine

## 2021-11-06 ENCOUNTER — Encounter: Payer: Self-pay | Admitting: Family Medicine

## 2021-11-06 VITALS — BP 138/82 | HR 90 | Resp 16 | Ht 64.0 in | Wt 214.0 lb

## 2021-11-06 DIAGNOSIS — Z23 Encounter for immunization: Secondary | ICD-10-CM

## 2021-11-06 DIAGNOSIS — E1159 Type 2 diabetes mellitus with other circulatory complications: Secondary | ICD-10-CM

## 2021-11-06 DIAGNOSIS — R808 Other proteinuria: Secondary | ICD-10-CM

## 2021-11-06 DIAGNOSIS — E1169 Type 2 diabetes mellitus with other specified complication: Secondary | ICD-10-CM

## 2021-11-06 DIAGNOSIS — E1129 Type 2 diabetes mellitus with other diabetic kidney complication: Secondary | ICD-10-CM

## 2021-11-06 DIAGNOSIS — R809 Proteinuria, unspecified: Secondary | ICD-10-CM | POA: Diagnosis not present

## 2021-11-06 DIAGNOSIS — Z794 Long term (current) use of insulin: Secondary | ICD-10-CM

## 2021-11-06 DIAGNOSIS — D649 Anemia, unspecified: Secondary | ICD-10-CM

## 2021-11-06 DIAGNOSIS — I152 Hypertension secondary to endocrine disorders: Secondary | ICD-10-CM

## 2021-11-06 DIAGNOSIS — G4733 Obstructive sleep apnea (adult) (pediatric): Secondary | ICD-10-CM

## 2021-11-06 DIAGNOSIS — E785 Hyperlipidemia, unspecified: Secondary | ICD-10-CM

## 2021-11-06 LAB — POCT GLYCOSYLATED HEMOGLOBIN (HGB A1C): Hemoglobin A1C: 6.6 % — AB (ref 4.0–5.6)

## 2021-11-06 MED ORDER — METFORMIN HCL ER 750 MG PO TB24: 750.0000 mg | ORAL_TABLET | Freq: Every day | ORAL | 0 refills | Status: DC

## 2021-11-06 MED ORDER — SHINGRIX 50 MCG/0.5ML IM SUSR: 0.5000 mL | Freq: Once | INTRAMUSCULAR | 1 refills | Status: AC

## 2021-11-07 ENCOUNTER — Other Ambulatory Visit: Payer: Self-pay | Admitting: Family Medicine

## 2021-11-07 DIAGNOSIS — I152 Hypertension secondary to endocrine disorders: Secondary | ICD-10-CM

## 2021-11-07 DIAGNOSIS — E1159 Type 2 diabetes mellitus with other circulatory complications: Secondary | ICD-10-CM

## 2021-11-07 LAB — COMPLETE METABOLIC PANEL WITH GFR
AG Ratio: 1.2 (calc) (ref 1.0–2.5)
ALT: 16 U/L (ref 6–29)
AST: 19 U/L (ref 10–35)
Albumin: 3.8 g/dL (ref 3.6–5.1)
Alkaline phosphatase (APISO): 118 U/L (ref 37–153)
BUN/Creatinine Ratio: 14 (calc) (ref 6–22)
BUN: 17 mg/dL (ref 7–25)
CO2: 37 mmol/L — ABNORMAL HIGH (ref 20–32)
Calcium: 9.3 mg/dL (ref 8.6–10.4)
Chloride: 102 mmol/L (ref 98–110)
Creat: 1.18 mg/dL — ABNORMAL HIGH (ref 0.50–1.05)
Globulin: 3.1 g/dL (calc) (ref 1.9–3.7)
Glucose, Bld: 186 mg/dL — ABNORMAL HIGH (ref 65–99)
Potassium: 3.7 mmol/L (ref 3.5–5.3)
Sodium: 142 mmol/L (ref 135–146)
Total Bilirubin: 0.4 mg/dL (ref 0.2–1.2)
Total Protein: 6.9 g/dL (ref 6.1–8.1)
eGFR: 50 mL/min/{1.73_m2} — ABNORMAL LOW (ref >=60)

## 2021-11-07 LAB — IRON,TIBC AND FERRITIN PANEL
%SAT: 20 % (calc) (ref 16–45)
Ferritin: 259 ng/mL (ref 16–288)
Iron: 64 ug/dL (ref 45–160)
TIBC: 324 mcg/dL (calc) (ref 250–450)

## 2021-11-07 LAB — CBC WITH DIFFERENTIAL/PLATELET
Absolute Monocytes: 673 cells/uL (ref 200–950)
Basophils Absolute: 41 cells/uL (ref 0–200)
Basophils Relative: 0.4 %
Eosinophils Absolute: 184 cells/uL (ref 15–500)
Eosinophils Relative: 1.8 %
HCT: 31.8 % — ABNORMAL LOW (ref 35.0–45.0)
Hemoglobin: 11 g/dL — ABNORMAL LOW (ref 11.7–15.5)
Lymphs Abs: 2356 cells/uL (ref 850–3900)
MCH: 30.9 pg (ref 27.0–33.0)
MCHC: 34.6 g/dL (ref 32.0–36.0)
MCV: 89.3 fL (ref 80.0–100.0)
MPV: 10 fL (ref 7.5–12.5)
Monocytes Relative: 6.6 %
Neutro Abs: 6946 cells/uL (ref 1500–7800)
Neutrophils Relative %: 68.1 %
Platelets: 298 10*3/uL (ref 140–400)
RBC: 3.56 10*6/uL — ABNORMAL LOW (ref 3.80–5.10)
RDW: 12.3 % (ref 11.0–15.0)
Total Lymphocyte: 23.1 %
WBC: 10.2 10*3/uL (ref 3.8–10.8)

## 2021-11-07 LAB — MICROALBUMIN / CREATININE URINE RATIO
Creatinine, Urine: 40 mg/dL (ref 20–275)
Microalb Creat Ratio: 1360 mcg/mg creat — ABNORMAL HIGH (ref <30)
Microalb, Ur: 54.4 mg/dL

## 2021-11-08 ENCOUNTER — Other Ambulatory Visit: Payer: Self-pay

## 2021-11-08 DIAGNOSIS — R808 Other proteinuria: Secondary | ICD-10-CM

## 2021-11-08 DIAGNOSIS — E1129 Type 2 diabetes mellitus with other diabetic kidney complication: Secondary | ICD-10-CM

## 2021-11-10 ENCOUNTER — Other Ambulatory Visit: Payer: Self-pay | Admitting: Family Medicine

## 2021-11-10 DIAGNOSIS — E1159 Type 2 diabetes mellitus with other circulatory complications: Secondary | ICD-10-CM

## 2021-11-10 DIAGNOSIS — R808 Other proteinuria: Secondary | ICD-10-CM

## 2021-11-10 DIAGNOSIS — I152 Hypertension secondary to endocrine disorders: Secondary | ICD-10-CM

## 2021-11-13 ENCOUNTER — Telehealth: Payer: Self-pay | Admitting: Family Medicine

## 2021-11-13 NOTE — Telephone Encounter (Signed)
Copied from [place] [phone]. Topic: Medicare AWV >> Nov 13, 2021 11:24 AM Cher Nakai R wrote: Reason for CRM:   Left message for patient to call back and schedule Medicare Annual Wellness Visit (AWV) in office.   If unable to come into the office for AWV,  please offer to do virtually or by telephone.  No hx of AWV eligible for AWVI as of  06/08/2021 per [employer]  Please schedule at anytime with [facility].      40 Minutes appointment   Any questions, please call me at [phone]

## 2021-11-21 ENCOUNTER — Ambulatory Visit (INDEPENDENT_AMBULATORY_CARE_PROVIDER_SITE_OTHER): Payer: Medicare PPO

## 2021-11-21 DIAGNOSIS — Z0000 Encounter for general adult medical examination without abnormal findings: Secondary | ICD-10-CM | POA: Diagnosis not present

## 2021-11-21 NOTE — Progress Notes (Signed)
Subjective:   Meagan [name] is a 71 y.o. female who presents for an Initial Medicare Annual Wellness Visit.  Virtual Visit via Telephone Note  I connected with  Meagan [name] on 11/21/21 at  2:50 PM EST by telephone and verified that I am speaking with the correct person using two identifiers.  Location: Patient: home Provider: Tom [name] Persons participating in the virtual visit: Meagan [name]   I discussed the limitations, risks, security and privacy concerns of performing an evaluation and management service by telephone and the availability of in person appointments. The patient expressed understanding and agreed to proceed.  Interactive audio and video telecommunications were attempted between this nurse and patient, however failed, due to patient having technical difficulties OR patient did not have access to video capability.  We continued and completed visit with audio only.  Some vital signs may be absent or patient reported.   Meagan Marker, LPN   Review of Systems     Cardiac Risk Factors include: advanced age (>44mn, >>62women);diabetes mellitus;dyslipidemia;hypertension;obesity (BMI >30kg/m2)     Objective:    There were no vitals filed for this visit. There is no height or weight on file to calculate BMI.  Advanced Directives 11/21/2021 12/02/2019 11/10/2019 07/02/2019 02/12/2019 01/10/2018 07/11/2017  Does Patient Have a Medical Advance Directive? Yes No No Yes Yes Yes Yes  Type of AParamedicof ATatumLiving will - - HSpecial educational needs teacherof ARosedaleLiving will Living will HEast WillistonLiving will  Does patient want to make changes to medical advance directive? - - - Yes (Inpatient - patient defers changing a medical advance directive at this time - Information given) No - Patient declined - -  Copy of [place] Chart? No - copy requested - - - No - copy requested - Yes     Current Medications (verified) Outpatient Encounter Medications as of 11/21/2021  Medication Sig   amLODipine (NORVASC) 5 MG tablet TAKE 1 TABLET (5 MG TOTAL) BY MOUTH DAILY.   atorvastatin (LIPITOR) 80 MG tablet TAKE 1 TABLET BY MOUTH DAILY AT 6 PM.   BD PEN NEEDLE NANO 2ND GEN 32G X 4 MM MISC USE AS DIRECTED DAILY   blood glucose meter kit and supplies Dispense based on patient and insurance preference. Use up to four times daily as directed. (FOR ICD-10 E10.9, E11.9).   carvedilol (COREG) 6.25 MG tablet TAKE 1 TABLET BY MOUTH 2 TIMES DAILY WITH A MEAL.   clopidogrel (PLAVIX) 75 MG tablet TAKE 1 TABLET BY MOUTH EVERY DAY   docusate sodium (COLACE) 100 MG capsule Take 1 capsule (100 mg total) by mouth daily as needed for mild constipation.   dorzolamide-timolol (COSOPT) 22.3-6.8 MG/ML ophthalmic solution SMARTSIG:In Eye(s)   hydrALAZINE (APRESOLINE) 10 MG tablet Take 1 tablet (10 mg total) by mouth 3 (three) times daily as needed. If bp goes above 150/90   LEVEMIR FLEXTOUCH 100 UNIT/ML FlexTouch Pen INJECT 24-50 UNITS INTO THE SKIN DAILY AFTER BREAKFAST.   levocetirizine (XYZAL) 5 MG tablet Take 5 mg by mouth daily as needed for allergies.    metFORMIN (GLUCOPHAGE-XR) 750 MG 24 hr tablet Take 1 tablet (750 mg total) by mouth daily with breakfast.   olmesartan-hydrochlorothiazide (BENICAR HCT) 40-25 MG tablet TAKE 1 TABLET BY MOUTH EVERY DAY   ONETOUCH ULTRA test strip USE UP TO 4 TIMES DAILY AS DIRECTED   polyethylene glycol (MIRALAX / GLYCOLAX) 17 g packet Take 17 g by  mouth daily.   RYBELSUS 14 MG TABS TAKE 1 TABLET BY MOUTH EVERY DAY   SENNA-PLUS 8.6-50 MG tablet Take 2 tablets by mouth at bedtime.   No facility-administered encounter medications on file as of 11/21/2021.    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   Diffuse cystic mastopathy    Fall 2013   Hypertension 2004   Occlusion and stenosis of carotid artery without mention of cerebral infarction    Sleep apnea    Stroke [facility])    Past Surgical History:  Procedure Laterality Date   APPENDECTOMY  2000s   BIOPSY THYROID  2006   [facility]   BREAST EXCISIONAL BIOPSY Left 2000s   neg   CARPAL TUNNEL RELEASE Left March 2015   COLONOSCOPY  2011   DR.WOHL   COLONOSCOPY WITH PROPOFOL N/A 07/11/2017   Procedure: COLONOSCOPY WITH PROPOFOL;  Surgeon: Jonathon Bellows, MD;  Location[facility] ENDOSCOPY;  Service: Gastroenterology;  Laterality: N/A;   ganglion cyst removal      KNEE SURGERY Left 08/2009   KNEE SURGERY Right 04/07/2010   REFRACTIVE SURGERY Bilateral    ROTATOR CUFF REPAIR Right 2008   TUBAL LIGATION     Family History  Problem Relation Age of Onset   Breast cancer Maternal Aunt    Hypertension Mother    Kidney Stones Mother    Hypertension Father    Congestive Heart Failure Father    Hypertension Paternal Grandfather    Diabetes Sister    Vision loss Sister    Varicose Veins Sister    Hypertension Sister    Breast cancer Sister 84       Double Mastectomy   Hypertension Sister    Social History   Socioeconomic History   Marital status: Widowed    Spouse name: Not on file   Number of children: 4   Years of education: Not on file   Highest education level: Master's degree (e.g., MA, MS, MEng, MEd, MSW, MBA)  Occupational History   Occupation: Higher education careers adviser   Tobacco Use   Smoking status: Never   Smokeless tobacco: Never  Vaping Use   Vaping Use: Never used  Substance and Sexual Activity   Alcohol use: No   Drug use: No   Sexual activity: Not Currently    Partners: Male    Comment: Widow for 14 years  Other Topics Concern   Not on file  Social History Narrative   She had a stroke on 06/2019, she has left peripheral vision loss since stroke   She moved in with  her youngest sister    Social Determinants of Radio broadcast assistant Strain: Low Risk    Difficulty of Paying Living Expenses: Not hard at all  Food Insecurity: No Food Insecurity   Worried About Charity fundraiser in the Last Year: Never true   Arboriculturist in the Last Year: Never true  Transportation Needs: No Transportation Needs   Lack of Transportation (Medical): No   Lack of Transportation (Non-Medical): No  Physical Activity: Inactive   Days of Exercise per Week: 0 days   Minutes of Exercise per Session: 0 min  Stress: No Stress Concern Present   Feeling of Stress : Not at all  Social Connections: Moderately Integrated  Frequency of Communication with Friends and Family: More than three times a week   Frequency of Social Gatherings with Friends and Family: More than three times a week   Attends Religious Services: More than 4 times per year   Active Member of [employer] or Organizations: Yes   Attends Archivist Meetings: More than 4 times per year   Marital Status: Widowed    Tobacco Counseling Counseling given: Not Answered   Clinical Intake:  Pre-visit preparation completed: Yes  Pain : No/denies pain     Nutritional Risks: None Diabetes: Yes CBG done?: No Did pt. bring in CBG monitor from home?: No  How often do you need to have someone help you when you read instructions, pamphlets, or other written materials from your doctor or pharmacy?: 1 - Never Nutrition Risk Assessment:  Has the patient had any N/V/D within the last 2 months?  No  Does the patient have any non-healing wounds?  No  Has the patient had any unintentional weight loss or weight gain?  No   Diabetes:  Is the patient diabetic?  Yes  If diabetic, was a CBG obtained today?  No  Did the patient bring in their glucometer from home?  No  How often do you monitor your CBG's? As directed.   Financial Strains and Diabetes Management:  Are you having any financial strains  with the device, your supplies or your medication? No .  Does the patient want to be seen by Chronic Care Management for management of their diabetes?  No  Would the patient like to be referred to a Nutritionist or for Diabetic Management?  No   Diabetic Exams:  Diabetic Eye Exam: Completed 05/29/21.   Diabetic Foot Exam: Completed 03/07/21.     Interpreter Needed?: No  Information entered by :: Meagan Marker LPN   Activities of Daily Living In your present state of health, do you have any difficulty performing the following activities: 11/21/2021 11/06/2021  Hearing? N N  Vision? Y Y  Difficulty concentrating or making decisions? N N  Walking or climbing stairs? Y Y  Dressing or bathing? N N  Doing errands, shopping? Tempie Donning  Preparing Food and eating ? N -  Using the Toilet? N -  In the past six months, have you accidently leaked urine? Y -  Comment wears depends for protection -  Do you have problems with loss of bowel control? N -  Managing your Medications? Y -  Comment vision difficulty -  Managing your Finances? N -  Housekeeping or managing your Housekeeping? N -  Some recent data might be hidden    Patient Care Team: Steele Sizer, MD as PCP - General Kate Sable, MD as PCP - Cardiology (Cardiology) Desma Maxim, MD as Referring Physician (Ophthalmology) Penni Bombard, MD as Consulting Physician (Neurology)  Indicate any recent Medical Services you may have received from other than [facility] providers in the past year (date may be approximate).     Assessment:   This is a routine wellness examination for Be[place].  Hearing/Vision screen Hearing Screening - Comments:: Pt denies hearing difficulty Vision Screening - Comments:: Annual vision screenings done by Dr. Matilde Sprang  Dietary issues and exercise activities discussed: Current Exercise Habits: The patient does not participate in regular exercise at present, Exercise limited by: Other - see comments  (vision)   Goals Addressed   None    Depression Screen [facility] 2/9 Scores 11/21/2021 11/06/2021 07/05/2021 03/07/2021 11/04/2020 08/29/2020 07/01/2020  PHQ - 2 Score 0 0 0 0 0 0 0  PHQ- 9 Score - 0 - - - - -    Fall Risk Fall Risk  11/21/2021 11/06/2021 07/05/2021 03/07/2021 11/04/2020  Falls in the past year? 0 0 0 0 0  Number falls in past yr: 0 0 0 0 0  Injury with Fall? 0 0 0 0 0  Risk for fall due to : No Fall Risks No Fall Risks No Fall Risks - -  Follow up Falls prevention discussed Falls prevention discussed Falls prevention discussed - -    FALL RISK PREVENTION PERTAINING TO THE HOME:  Any stairs in or around the home? Yes  If so, are there any without handrails? Yes  - outside steps Home free of loose throw rugs in walkways, pet beds, electrical cords, etc? Yes  Adequate lighting in your home to reduce risk of falls? Yes   ASSISTIVE DEVICES UTILIZED TO PREVENT FALLS:  Life alert? No  Use of a cane, walker or w/c? Yes  Grab bars in the bathroom? No Shower chair or bench in shower? Yes  Elevated toilet seat or a handicapped toilet? No   TIMED UP AND GO:  Was the test performed? No . Telephonic visit.   Cognitive Function: Normal cognitive status assessed by direct observation by this Nurse Health Advisor. No abnormalities found.          Immunizations Immunization History  Administered Date(s) Administered   Fluad Quad(high Dose 65+) 07/24/2019, 07/01/2020   Influenza, High Dose Seasonal PF 07/05/2021   PFIZER(Purple Top)SARS-COV-2 Vaccination 12/06/2019, 12/29/2019, 09/14/2020   Pneumococcal Conjugate-13 10/22/2018   Pneumococcal Polysaccharide-23 10/08/2006, 03/01/2020   Tdap 10/09/2007, 03/26/2018    TDAP status: Up to date  Flu Vaccine status: Up to date  Pneumococcal vaccine status: Up to date  Covid-19 vaccine status: Completed vaccines  Qualifies for Shingles Vaccine? Yes   Zostavax completed No   Shingrix Completed?: No.    Education has been  provided regarding the importance of this vaccine. Patient has been advised to call insurance company to determine out of pocket expense if they have not yet received this vaccine. Advised may also receive vaccine at local pharmacy or Health Dept. Verbalized acceptance and understanding.  Screening Tests Health Maintenance  Topic Date Due   Zoster Vaccines- Shingrix (1 of 2) Never done   COVID-19 Vaccine (4 - Booster for Pfizer series) 11/09/2020   FOOT EXAM  03/07/2022   HEMOGLOBIN A1C  05/06/2022   OPHTHALMOLOGY EXAM  05/29/2022   COLONOSCOPY (Pts 45-5yr Insurance coverage will need to be confirmed)  07/11/2022   MAMMOGRAM  04/05/2023   TETANUS/TDAP  03/26/2028   Pneumonia Vaccine 71 Years old  Completed   INFLUENZA VACCINE  Completed   DEXA SCAN  Completed   Hepatitis C Screening  Completed   HPV VACCINES  Aged Out    Health Maintenance  Health Maintenance Due  Topic Date Due   Zoster Vaccines- Shingrix (1 of 2) Never done   COVID-19 Vaccine (4 - Booster for [place]) 11/09/2020    Colorectal cancer screening: Type of screening: Colonoscopy. Completed 07/11/17. Repeat every 5 years  Mammogram status: Completed 04/04/21. Repeat every year  Bone Density status: Completed 03/23/20. Results reflect: Bone density results: NORMAL. Repeat every 2 years.  Lung Cancer Screening: (Low Dose CT Chest recommended if Age 69-80years, 30 pack-year currently smoking OR have quit w/in 15years.) does not qualify.   Additional Screening:  Hepatitis C Screening: does  qualify; Completed 02/17/13  Vision Screening: Recommended annual ophthalmology exams for early detection of glaucoma and other disorders of the eye. Is the patient up to date with their annual eye exam?  Yes  Who is the provider or what is the name of the office in which the patient attends annual eye exams? Dr. Matilde Sprang.   Dental Screening: Recommended annual dental exams for proper oral hygiene  Community Resource  Referral / Chronic Care Management: CRR required this visit?  No   CCM required this visit?  No      Plan:     I have personally reviewed and noted the following in the patients chart:   Medical and social history Use of alcohol, tobacco or illicit drugs  Current medications and supplements including opioid prescriptions. Patient is not currently taking opioid prescriptions. Functional ability and status Nutritional status Physical activity Advanced directives List of other physicians Hospitalizations, surgeries, and ER visits in previous 12 months Vitals Screenings to include cognitive, depression, and falls Referrals and appointments  In addition, I have reviewed and discussed with patient certain preventive protocols, quality metrics, and best practice recommendations. A written personalized care plan for preventive services as well as general preventive health recommendations were provided to patient.   Due to this being a telephonic visit, the after visit summary with patients personalized plan was offered to patient via my-chart.   Meagan Marker, LPN   [id number]   Nurse Notes: none

## 2021-11-21 NOTE — Patient Instructions (Signed)
Meagan [name] , Thank you for taking time to come for your Medicare Wellness Visit. I appreciate your ongoing commitment to your health goals. Please review the following plan we discussed and let me know if I can assist you in the future.   Screening recommendations/referrals: Colonoscopy: done 07/11/17. Repeat 07/2022 Mammogram: done 04/04/21 Bone Density: done 03/23/20 Recommended yearly ophthalmology/optometry visit for glaucoma screening and checkup Recommended yearly dental visit for hygiene and checkup  Vaccinations: Influenza vaccine: done 07/05/21 Pneumococcal vaccine: done 03/01/20 Tdap vaccine: done 03/26/18 Shingles vaccine: Shingrix discussed. Please contact your pharmacy for coverage information.  Covid-19:done 12/06/19, 12/29/19 & 09/14/20  Advanced directives: Please bring a copy of your health care power of attorney and living will to the office at your convenience.   Conditions/risks identified: Recommend increasing physical activity   Next appointment: Follow up in one year for your annual wellness visit    Preventive Care 65 Years and Older, Female Preventive care refers to lifestyle choices and visits with your health care provider that can promote health and wellness. What does preventive care include? A yearly physical exam. This is also called an annual well check. Dental exams once or twice a year. Routine eye exams. Ask your health care provider how often you should have your eyes checked. Personal lifestyle choices, including: Daily care of your teeth and gums. Regular physical activity. Eating a healthy diet. Avoiding tobacco and drug use. Limiting alcohol use. Practicing safe sex. Taking low-dose aspirin every day. Taking vitamin and mineral supplements as recommended by your health care provider. What happens during an annual well check? The services and screenings done by your health care provider during your annual well check will depend on your age,  overall health, lifestyle risk factors, and family history of disease. Counseling  Your health care provider may ask you questions about your: Alcohol use. Tobacco use. Drug use. Emotional well-being. Home and relationship well-being. Sexual activity. Eating habits. History of falls. Memory and ability to understand (cognition). Work and work Statistician. Reproductive health. Screening  You may have the following tests or measurements: Height, weight, and BMI. Blood pressure. Lipid and cholesterol levels. These may be checked every 5 years, or more frequently if you are over 42 years old. Skin check. Lung cancer screening. You may have this screening every year starting at age 14 if you have a 30-pack-year history of smoking and currently smoke or have quit within the past 15 years. Fecal occult blood test (FOBT) of the stool. You may have this test every year starting at age 59. Flexible sigmoidoscopy or colonoscopy. You may have a sigmoidoscopy every 5 years or a colonoscopy every 10 years starting at age 20. Hepatitis C blood test. Hepatitis B blood test. Sexually transmitted disease (STD) testing. Diabetes screening. This is done by checking your blood sugar (glucose) after you have not eaten for a while (fasting). You may have this done every 1-3 years. Bone density scan. This is done to screen for osteoporosis. You may have this done starting at age 73. Mammogram. This may be done every 1-2 years. Talk to your health care provider about how often you should have regular mammograms. Talk with your health care provider about your test results, treatment options, and if necessary, the need for more tests. Vaccines  Your health care provider may recommend certain vaccines, such as: Influenza vaccine. This is recommended every year. Tetanus, diphtheria, and acellular pertussis (Tdap, Td) vaccine. You may need a Td booster every 10 years. Zoster  vaccine. You may need this after age  28. Pneumococcal 13-valent conjugate (PCV13) vaccine. One dose is recommended after age 61. Pneumococcal polysaccharide (PPSV23) vaccine. One dose is recommended after age 57. Talk to your health care provider about which screenings and vaccines you need and how often you need them. This information is not intended to replace advice given to you by your health care provider. Make sure you discuss any questions you have with your health care provider. Document Released: 10/21/2015 Document Revised: 06/13/2016 Document Reviewed: 07/26/2015 Elsevier Interactive Patient Education  2017 [employer] Prevention in the Home Falls can cause injuries. They can happen to people of all ages. There are many things you can do to make your home safe and to help prevent falls. What can I do on the outside of my home? Regularly fix the edges of walkways and driveways and fix any cracks. Remove anything that might make you trip as you walk through a door, such as a raised step or threshold. Trim any bushes or trees on the path to your home. Use bright outdoor lighting. Clear any walking paths of anything that might make someone trip, such as rocks or tools. Regularly check to see if handrails are loose or broken. Make sure that both sides of any steps have handrails. Any raised decks and porches should have guardrails on the edges. Have any leaves, snow, or ice cleared regularly. Use sand or salt on walking paths during winter. Clean up any spills in your garage right away. This includes oil or grease spills. What can I do in the bathroom? Use night lights. Install grab bars by the toilet and in the tub and shower. Do not use towel bars as grab bars. Use non-skid mats or decals in the tub or shower. If you need to sit down in the shower, use a plastic, non-slip stool. Keep the floor dry. Clean up any water that spills on the floor as soon as it happens. Remove soap buildup in the tub or shower  regularly. Attach bath mats securely with double-sided non-slip rug tape. Do not have throw rugs and other things on the floor that can make you trip. What can I do in the bedroom? Use night lights. Make sure that you have a light by your bed that is easy to reach. Do not use any sheets or blankets that are too big for your bed. They should not hang down onto the floor. Have a firm chair that has side arms. You can use this for support while you get dressed. Do not have throw rugs and other things on the floor that can make you trip. What can I do in the kitchen? Clean up any spills right away. Avoid walking on wet floors. Keep items that you use a lot in easy-to-reach places. If you need to reach something above you, use a strong step stool that has a grab bar. Keep electrical cords out of the way. Do not use floor polish or wax that makes floors slippery. If you must use wax, use non-skid floor wax. Do not have throw rugs and other things on the floor that can make you trip. What can I do with my stairs? Do not leave any items on the stairs. Make sure that there are handrails on both sides of the stairs and use them. Fix handrails that are broken or loose. Make sure that handrails are as long as the stairways. Check any carpeting to make sure that it  is firmly attached to the stairs. Fix any carpet that is loose or worn. Avoid having throw rugs at the top or bottom of the stairs. If you do have throw rugs, attach them to the floor with carpet tape. Make sure that you have a light switch at the top of the stairs and the bottom of the stairs. If you do not have them, ask someone to add them for you. What else can I do to help prevent falls? Wear shoes that: Do not have high heels. Have rubber bottoms. Are comfortable and fit you well. Are closed at the toe. Do not wear sandals. If you use a stepladder: Make sure that it is fully opened. Do not climb a closed stepladder. Make sure that  both sides of the stepladder are locked into place. Ask someone to hold it for you, if possible. Clearly mark and make sure that you can see: Any grab bars or handrails. First and last steps. Where the edge of each step is. Use tools that help you move around (mobility aids) if they are needed. These include: Canes. Walkers. Scooters. Crutches. Turn on the lights when you go into a dark area. Replace any light bulbs as soon as they burn out. Set up your furniture so you have a clear path. Avoid moving your furniture around. If any of your floors are uneven, fix them. If there are any pets around you, be aware of where they are. Review your medicines with your doctor. Some medicines can make you feel dizzy. This can increase your chance of falling. Ask your doctor what other things that you can do to help prevent falls. This information is not intended to replace advice given to you by your health care provider. Make sure you discuss any questions you have with your health care provider. Document Released: 07/21/2009 Document Revised: 03/01/2016 Document Reviewed: 10/29/2014 Elsevier Interactive Patient Education  2017 [employer].

## 2021-11-30 ENCOUNTER — Other Ambulatory Visit: Payer: Self-pay | Admitting: Family Medicine

## 2021-11-30 DIAGNOSIS — E1129 Type 2 diabetes mellitus with other diabetic kidney complication: Secondary | ICD-10-CM

## 2021-11-30 DIAGNOSIS — E1169 Type 2 diabetes mellitus with other specified complication: Secondary | ICD-10-CM

## 2021-12-14 DIAGNOSIS — H2511 Age-related nuclear cataract, right eye: Secondary | ICD-10-CM | POA: Diagnosis not present

## 2021-12-14 DIAGNOSIS — H2512 Age-related nuclear cataract, left eye: Secondary | ICD-10-CM | POA: Diagnosis not present

## 2021-12-14 DIAGNOSIS — H25011 Cortical age-related cataract, right eye: Secondary | ICD-10-CM | POA: Diagnosis not present

## 2021-12-15 DIAGNOSIS — H2512 Age-related nuclear cataract, left eye: Secondary | ICD-10-CM | POA: Diagnosis not present

## 2021-12-21 DIAGNOSIS — G4733 Obstructive sleep apnea (adult) (pediatric): Secondary | ICD-10-CM | POA: Diagnosis not present

## 2021-12-21 DIAGNOSIS — D631 Anemia in chronic kidney disease: Secondary | ICD-10-CM | POA: Diagnosis not present

## 2021-12-21 DIAGNOSIS — R809 Proteinuria, unspecified: Secondary | ICD-10-CM | POA: Diagnosis not present

## 2021-12-21 DIAGNOSIS — I639 Cerebral infarction, unspecified: Secondary | ICD-10-CM | POA: Insufficient documentation

## 2021-12-21 DIAGNOSIS — E785 Hyperlipidemia, unspecified: Secondary | ICD-10-CM | POA: Diagnosis not present

## 2021-12-21 DIAGNOSIS — N1831 Chronic kidney disease, stage 3a: Secondary | ICD-10-CM | POA: Diagnosis not present

## 2021-12-21 DIAGNOSIS — E1122 Type 2 diabetes mellitus with diabetic chronic kidney disease: Secondary | ICD-10-CM | POA: Diagnosis not present

## 2021-12-21 DIAGNOSIS — E663 Overweight: Secondary | ICD-10-CM | POA: Diagnosis not present

## 2021-12-21 DIAGNOSIS — I10 Essential (primary) hypertension: Secondary | ICD-10-CM | POA: Diagnosis not present

## 2021-12-22 ENCOUNTER — Other Ambulatory Visit: Payer: Self-pay | Admitting: Nephrology

## 2021-12-22 DIAGNOSIS — D631 Anemia in chronic kidney disease: Secondary | ICD-10-CM

## 2021-12-22 DIAGNOSIS — N1831 Chronic kidney disease, stage 3a: Secondary | ICD-10-CM

## 2021-12-22 DIAGNOSIS — R809 Proteinuria, unspecified: Secondary | ICD-10-CM

## 2021-12-22 DIAGNOSIS — E1122 Type 2 diabetes mellitus with diabetic chronic kidney disease: Secondary | ICD-10-CM

## 2021-12-29 ENCOUNTER — Other Ambulatory Visit: Payer: Self-pay | Admitting: Family Medicine

## 2021-12-29 DIAGNOSIS — E1129 Type 2 diabetes mellitus with other diabetic kidney complication: Secondary | ICD-10-CM

## 2022-01-02 ENCOUNTER — Other Ambulatory Visit: Payer: Self-pay

## 2022-01-02 ENCOUNTER — Ambulatory Visit
Admission: RE | Admit: 2022-01-02 | Discharge: 2022-01-02 | Disposition: A | Discharge status: Home / Self Care | Payer: Medicare PPO | Source: Ambulatory Visit | Attending: Nephrology | Admitting: Nephrology

## 2022-01-02 DIAGNOSIS — D631 Anemia in chronic kidney disease: Secondary | ICD-10-CM | POA: Diagnosis not present

## 2022-01-02 DIAGNOSIS — N1831 Chronic kidney disease, stage 3a: Secondary | ICD-10-CM | POA: Insufficient documentation

## 2022-01-02 DIAGNOSIS — E1122 Type 2 diabetes mellitus with diabetic chronic kidney disease: Secondary | ICD-10-CM | POA: Diagnosis not present

## 2022-01-02 DIAGNOSIS — R809 Proteinuria, unspecified: Secondary | ICD-10-CM | POA: Diagnosis not present

## 2022-01-02 DIAGNOSIS — N189 Chronic kidney disease, unspecified: Secondary | ICD-10-CM | POA: Diagnosis not present

## 2022-01-02 DIAGNOSIS — N261 Atrophy of kidney (terminal): Secondary | ICD-10-CM | POA: Diagnosis not present

## 2022-01-04 DIAGNOSIS — H25012 Cortical age-related cataract, left eye: Secondary | ICD-10-CM | POA: Diagnosis not present

## 2022-01-04 DIAGNOSIS — H2512 Age-related nuclear cataract, left eye: Secondary | ICD-10-CM | POA: Diagnosis not present

## 2022-01-24 ENCOUNTER — Ambulatory Visit: Payer: Medicare PPO

## 2022-01-24 DIAGNOSIS — G4733 Obstructive sleep apnea (adult) (pediatric): Secondary | ICD-10-CM

## 2022-01-24 NOTE — Progress Notes (Signed)
95 percentile pressure 13.9 ? ? 95th percentile leak 26.1 ? ? apnea index 1.4 /hr ? apnea-hypopnea index  1.9 /hr ? ? total days used  >4 hr 84 days ? total days used <4 hr 5 days ? ?Total compliance 93 percent ? ?She is doing great no problems or questions at this time ?Pt was seen by Claiborne Billings  RRT/RCP  from [facility]  ?

## 2022-01-25 DIAGNOSIS — R809 Proteinuria, unspecified: Secondary | ICD-10-CM | POA: Diagnosis not present

## 2022-01-25 DIAGNOSIS — E663 Overweight: Secondary | ICD-10-CM | POA: Diagnosis not present

## 2022-01-25 DIAGNOSIS — E785 Hyperlipidemia, unspecified: Secondary | ICD-10-CM | POA: Diagnosis not present

## 2022-01-25 DIAGNOSIS — N1831 Chronic kidney disease, stage 3a: Secondary | ICD-10-CM | POA: Diagnosis not present

## 2022-01-25 DIAGNOSIS — D631 Anemia in chronic kidney disease: Secondary | ICD-10-CM | POA: Diagnosis not present

## 2022-01-25 DIAGNOSIS — I10 Essential (primary) hypertension: Secondary | ICD-10-CM | POA: Diagnosis not present

## 2022-01-25 DIAGNOSIS — G4733 Obstructive sleep apnea (adult) (pediatric): Secondary | ICD-10-CM | POA: Diagnosis not present

## 2022-01-25 DIAGNOSIS — E1122 Type 2 diabetes mellitus with diabetic chronic kidney disease: Secondary | ICD-10-CM | POA: Diagnosis not present

## 2022-01-25 DIAGNOSIS — I639 Cerebral infarction, unspecified: Secondary | ICD-10-CM | POA: Diagnosis not present

## 2022-02-01 DIAGNOSIS — H43811 Vitreous degeneration, right eye: Secondary | ICD-10-CM | POA: Diagnosis not present

## 2022-02-01 DIAGNOSIS — H35033 Hypertensive retinopathy, bilateral: Secondary | ICD-10-CM | POA: Diagnosis not present

## 2022-02-01 DIAGNOSIS — H3582 Retinal ischemia: Secondary | ICD-10-CM | POA: Diagnosis not present

## 2022-02-01 DIAGNOSIS — E113313 Type 2 diabetes mellitus with moderate nonproliferative diabetic retinopathy with macular edema, bilateral: Secondary | ICD-10-CM | POA: Diagnosis not present

## 2022-02-02 ENCOUNTER — Telehealth: Payer: Self-pay

## 2022-02-02 NOTE — Telephone Encounter (Signed)
Verbal order for OSA supplies signed by provider and placed in [facility] folder. ?

## 2022-02-08 DIAGNOSIS — H35033 Hypertensive retinopathy, bilateral: Secondary | ICD-10-CM | POA: Diagnosis not present

## 2022-02-08 DIAGNOSIS — H40029 Open angle with borderline findings, high risk, unspecified eye: Secondary | ICD-10-CM | POA: Diagnosis not present

## 2022-02-08 DIAGNOSIS — E113313 Type 2 diabetes mellitus with moderate nonproliferative diabetic retinopathy with macular edema, bilateral: Secondary | ICD-10-CM | POA: Diagnosis not present

## 2022-02-08 DIAGNOSIS — I6930 Unspecified sequelae of cerebral infarction: Secondary | ICD-10-CM | POA: Diagnosis not present

## 2022-02-08 DIAGNOSIS — Z961 Presence of intraocular lens: Secondary | ICD-10-CM | POA: Diagnosis not present

## 2022-02-08 DIAGNOSIS — Z9842 Cataract extraction status, left eye: Secondary | ICD-10-CM | POA: Diagnosis not present

## 2022-02-08 DIAGNOSIS — Z9841 Cataract extraction status, right eye: Secondary | ICD-10-CM | POA: Diagnosis not present

## 2022-02-13 ENCOUNTER — Encounter: Payer: Self-pay | Admitting: Nurse Practitioner

## 2022-02-13 ENCOUNTER — Ambulatory Visit: Payer: Medicare PPO | Admitting: Nurse Practitioner

## 2022-02-13 VITALS — BP 137/82 | HR 89 | Temp 97.8°F | Resp 16 | Ht 64.0 in | Wt 207.2 lb

## 2022-02-13 DIAGNOSIS — E662 Morbid (severe) obesity with alveolar hypoventilation: Secondary | ICD-10-CM

## 2022-02-13 DIAGNOSIS — Z6835 Body mass index (BMI) 35.0-35.9, adult: Secondary | ICD-10-CM

## 2022-02-13 DIAGNOSIS — Z9989 Dependence on other enabling machines and devices: Secondary | ICD-10-CM

## 2022-02-13 DIAGNOSIS — Z7189 Other specified counseling: Secondary | ICD-10-CM | POA: Diagnosis not present

## 2022-02-13 DIAGNOSIS — G4733 Obstructive sleep apnea (adult) (pediatric): Secondary | ICD-10-CM

## 2022-02-13 NOTE — Progress Notes (Signed)
[facility] [place], [place] [address]  Internal MEDICINE  Office Visit Note  Patient Name: Meagan [name]  [id number]  [id number]  Date of Service: 02/13/2022  Chief Complaint  Patient presents with   Follow-up    HPI Gabrielle presents for a follow up visit for OSA on CPAP. She reports that she is doing well with the CPAP machine and her sleep is restful. She is not sleeping during the day and does not wake up feeling tired. Epworth score 2/24  CPAP download: 95 percentile pressure 13.9 95th percentile leak 26.1 apnea index 1.4 /hr apnea-hypopnea index  1.9 /hr total days used  >4 hr 84 days total days used <4 hr 5 days Total compliance 93 percent   Pt was seen by Claiborne Billings  RRT/RCP  from [facility]  for CPAP download May need supplies due to changing insurances, will put order in just in case and let kelly know.   EPWORTH SLEEPINESS SCALE: Scale: (0)= no chance of dozing; (1)= slight chance of dozing; (2)= moderate chance of dozing; (3)= high chance of dozing Chance  Situtation Sitting and reading: 0 Watching TV: 0 Sitting Inactive in public: 0 As a passenger in car: 1   Lying down to rest: 1 Sitting and talking: 0 Sitting quielty after lunch: 0 In a car, stopped in traffic: 0 TOTAL SCORE:   2 out of 24    Current Medication: Outpatient Encounter Medications as of 02/13/2022  Medication Sig Note   BD PEN NEEDLE NANO 2ND GEN 32G X 4 MM MISC USE AS DIRECTED DAILY    blood glucose meter kit and supplies Dispense based on patient and insurance preference. Use up to four times daily as directed. (FOR ICD-10 E10.9, E11.9).    carvedilol (COREG) 6.25 MG tablet TAKE 1 TABLET BY MOUTH 2 TIMES DAILY WITH A MEAL.    docusate sodium (COLACE) 100 MG capsule Take 1 capsule (100 mg total) by mouth daily as needed for mild constipation.    dorzolamide-timolol (COSOPT) 22.3-6.8 MG/ML ophthalmic solution SMARTSIG:In Eye(s)    hydrALAZINE (APRESOLINE) 10 MG tablet Take 1  tablet (10 mg total) by mouth 3 (three) times daily as needed. If bp goes above 150/90 11/21/2021: Pt states she has not had to use   levocetirizine (XYZAL) 5 MG tablet Take 5 mg by mouth daily as needed for allergies.     ONETOUCH ULTRA test strip USE UP TO 4 TIMES DAILY AS DIRECTED    polyethylene glycol (MIRALAX / GLYCOLAX) 17 g packet Take 17 g by mouth daily.    [DISCONTINUED] amLODipine (NORVASC) 5 MG tablet TAKE 1 TABLET (5 MG TOTAL) BY MOUTH DAILY.    [DISCONTINUED] atorvastatin (LIPITOR) 80 MG tablet TAKE 1 TABLET BY MOUTH DAILY AT 6 PM.    [DISCONTINUED] clopidogrel (PLAVIX) 75 MG tablet TAKE 1 TABLET BY MOUTH EVERY DAY    [DISCONTINUED] LEVEMIR FLEXTOUCH 100 UNIT/ML FlexTouch Pen INJECT 24-50 UNITS INTO THE SKIN DAILY AFTER BREAKFAST.    [DISCONTINUED] metFORMIN (GLUCOPHAGE-XR) 750 MG 24 hr tablet Take 1 tablet (750 mg total) by mouth daily with breakfast.    [DISCONTINUED] olmesartan-hydrochlorothiazide (BENICAR HCT) 40-25 MG tablet TAKE 1 TABLET BY MOUTH EVERY DAY    [DISCONTINUED] RYBELSUS 14 MG TABS TAKE 1 TABLET BY MOUTH EVERY DAY    [DISCONTINUED] SENNA-PLUS 8.6-50 MG tablet Take 2 tablets by mouth at bedtime. 11/04/2020: Takes one tablet in the am and one at bedtime    No facility-administered encounter medications on  file as of 02/13/2022.    Surgical History: Past Surgical History:  Procedure Laterality Date   APPENDECTOMY  2000s   BIOPSY THYROID  2006   [facility]   BREAST EXCISIONAL BIOPSY Left 2000s   neg   CARPAL TUNNEL RELEASE Left 12/2013   CATARACT EXTRACTION, BILATERAL Bilateral    Right 12/14/21 and Left 01/04/22   COLONOSCOPY  2011   DR[facility]   COLONOSCOPY WITH PROPOFOL N/A 07/11/2017   Procedure: COLONOSCOPY WITH PROPOFOL;  Surgeon: Jonathon Bellows, MD;  Location: [facility] ENDOSCOPY;  Service: Gastroenterology;  Laterality: N/A;   ganglion cyst removal      KNEE SURGERY Left 08/2009   KNEE SURGERY Right 04/07/2010   REFRACTIVE SURGERY Bilateral    ROTATOR CUFF REPAIR Right  2008   TUBAL LIGATION      Medical History: Past Medical History:  Diagnosis Date   Cellulitis    left leg   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   Diffuse cystic mastopathy    Fall 2013   Hypertension 2004   Occlusion and stenosis of carotid artery without mention of cerebral infarction    Sleep apnea    Stroke [facility])     Family History: Family History  Problem Relation Age of Onset   Breast cancer Maternal Aunt    Hypertension Mother    Kidney Stones Mother    Hypertension Father    Congestive Heart Failure Father    Hypertension Paternal Grandfather    Diabetes Sister    Vision loss Sister    Varicose Veins Sister    Hypertension Sister    Breast cancer Sister 23       Double Mastectomy   Hypertension Sister     Social History   Socioeconomic History   Marital status: Widowed    Spouse name: Not on file   Number of children: 4   Years of education: Not on file   Highest education level: Master's degree (e.g., MA, MS, MEng, MEd, MSW, MBA)  Occupational History   Occupation: Higher education careers adviser   Tobacco Use   Smoking status: Never   Smokeless tobacco: Never  Vaping Use   Vaping Use: Never used  Substance and Sexual Activity   Alcohol use: No   Drug use: No   Sexual activity: Not Currently    Partners: Male    Comment: Widow for 14 years  Other Topics Concern   Not on file  Social History Narrative   She had a stroke on 06/2019, she has left peripheral vision loss since stroke   She moved in with her youngest sister    Social Determinants of Health   Financial Resource Strain: Low Risk  (11/21/2021)   Overall Financial Resource Strain (CARDIA)    Difficulty of Paying Living Expenses: Not hard at all  Food Insecurity: No Food Insecurity (11/21/2021)   Hunger Vital Sign    Worried About Running Out of Food in the Last Year: Never true    Ran Out of Food in the Last Year: Never true  Transportation Needs: No Transportation  Needs (11/21/2021)   PRAPARE - Hydrologist (Medical): No    Lack of Transportation (Non-Medical): No  Physical Activity: Inactive (11/21/2021)   Exercise Vital Sign    Days of Exercise per Week: 0 days    Minutes of Exercise per Session: 0 min  Stress: No Stress Concern Present (11/21/2021)   [place]  Questionnaire    Feeling of Stress : Not at all  Social Connections: Moderately Integrated (11/21/2021)   Social Connection and Isolation Panel [NHANES]    Frequency of Communication with Friends and Family: More than three times a week    Frequency of Social Gatherings with Friends and Family: More than three times a week    Attends Religious Services: More than 4 times per year    Active Member of [employer] or Organizations: Yes    Attends Archivist Meetings: More than 4 times per year    Marital Status: Widowed  Intimate Partner Violence: Not At Risk (11/21/2021)   Humiliation, Afraid, Rape, and Kick questionnaire    Fear of Current or Ex-Partner: No    Emotionally Abused: No    Physically Abused: No    Sexually Abused: No      Review of Systems  Constitutional:  Negative for chills, fatigue and unexpected weight change.  HENT:  Negative for congestion, postnasal drip, rhinorrhea, sneezing and sore throat.   Respiratory: Negative.  Negative for cough, chest tightness, shortness of breath and wheezing.   Cardiovascular: Negative.  Negative for chest pain and palpitations.  Musculoskeletal:  Negative for arthralgias, back pain, joint swelling and neck pain.  Neurological: Negative.   Psychiatric/Behavioral:  Negative for behavioral problems (Depression), self-injury, sleep disturbance and suicidal ideas. The patient is not nervous/anxious.     Vital Signs: BP 137/82   Pulse 89   Temp 97.8 F (36.6 C)   Resp 16   Ht '5\' 4"'  (1.626 m)   Wt 207 lb 3.2 oz (94 kg)   SpO2 96%   BMI 35.57 kg/m     Physical Exam Vitals reviewed.  Constitutional:      General: She is not in acute distress.    Appearance: Normal appearance. She is obese. She is not ill-appearing.  HENT:     Head: Normocephalic and atraumatic.  Eyes:     Pupils: Pupils are equal, round, and reactive to light.  Cardiovascular:     Rate and Rhythm: Normal rate and regular rhythm.  Pulmonary:     Effort: Pulmonary effort is normal. No respiratory distress.  Neurological:     Mental Status: She is alert and oriented to person, place, and time.     Cranial Nerves: No cranial nerve deficit.  Psychiatric:        Mood and Affect: Mood normal.        Behavior: Behavior normal.        Assessment/Plan: 1. OSA on CPAP Doing well, continue to use CPAP as instructed, supplies ordered. 93% compliance. Epworth is 2 out of 24.  - For home use only DME continuous positive airway pressure (CPAP)   2. CPAP use counseling Patient has no questions or concerns regarding routine maintenance and cleaning of CPAP machine. may need supplies since switching insurances - For home use only DME continuous positive airway pressure (CPAP)  3. Class 2 obesity with alveolar hypoventilation, serious comorbidity, and body mass index (BMI) of 35.0 to 35.9 in adult [facility]) Patient is aware that weight loss will improve sleep apnea. She has lost 12 lbs since her previous office visit. Keep up the great work!   General Counseling: kylina vultaggio understanding of the findings of todays visit and agrees with plan of treatment. I have discussed any further diagnostic evaluation that may be needed or ordered today. We also reviewed her medications today. she has been encouraged to call the office with any  questions or concerns that should arise related to todays visit.    Orders Placed This Encounter  Procedures   For home use only DME continuous positive airway pressure (CPAP)    No orders of the defined types were placed in this  encounter.   Return in about 6 months (around 08/16/2022) for F/U, pulmonary/sleep, Shonnie Poudrier .   Total time spent:30 Minutes Time spent includes review of chart, medications, test results, and follow up plan with the patient.   [place] Controlled Substance Database was reviewed by me.  This patient was seen by Jonetta Osgood, FNP-C in collaboration with Dr. Clayborn Bigness as a part of collaborative care agreement.   Dima Mini R. Valetta Fuller, MSN, FNP-C Internal medicine

## 2022-02-19 ENCOUNTER — Other Ambulatory Visit: Payer: Self-pay | Admitting: Family Medicine

## 2022-02-19 DIAGNOSIS — Z1231 Encounter for screening mammogram for malignant neoplasm of breast: Secondary | ICD-10-CM

## 2022-02-27 NOTE — Progress Notes (Unsigned)
Name: CALE DECAROLIS   MRN: [id number]    DOB: 07-16-1951   Date:02/28/2022       Progress Note  Subjective  Chief Complaint  Follow Up  HPI  DMII: she states since she stopped Ozempic due to significant weight loss. She is currently taking Rybelsus 14 mg , A1C went from  6.9 % to 7.1 %, it went up to 7.%, changed her diet and A1C is down from  6.6 % to 6.2 %  .She also takes Levemir  but down to 26 units per day and since last visit also on Rybelsus and down on Metformin just 750 mg daily  She has dyslipidemia, HTN and microalbuminuria, she is on ARB, statin therapy. FSBS has been well controlled. She is up to date with eye exam. Reviewed last labs   CKI stage III: now seeing Dr. Lanora Manis and GFR was 48, she cannot take SGL2 agonist. She is on ARB   HTN: bp at home has been under control No headaches, dizziness, chest pain or palpitation. She is on Benicar hctz 40/25, carvedilol BID and Norvasc,  she has hydralazine prn , but bp has not spiked at home, staying at goal   S/p CVA: date of CVA was 07/01/2019, admitted from 07/01/2019 until 07/08/2019 and had inpatient rehab until 07/17/2019. She developed right occipital headaches and bp was spiking . She went to eye doctor and bp was over 200's , he called me and we advised to send her to [facility], she arrived to [facility] with her daughter. CTA showed 45 % stenosis proximal right internal carotid artery , 60 % stenosis right external carotid artery , left distal common external carotid was 30 % , echo showed normal EF, LDL was very high at 199 , she is now on Atorvastatin and last LDL down to 37 , she is also taking Plavix 75 mg . She has regained some vision but still has some  peripheral vision loss on both sides, but worse on left side. She lives with her sister and needs assistance cooking, still not allowed to drive. She sees a retina sub-specialist and is visually impaired  recently seen by Dr. Matilde Sprang , s/p cataract surgery and is going back for steroid  injections on both eyes   OSA: seen by Dr. Humphrey Rolls and had sleep study, wearing cpap every night, tolerating it well. She has been compliant with her CPAP lately    Morbid obesity: she has a BMI above 35 and multiple co-morbidities, she is on Rybelsus now and weight is down a few pounds   Patient Active Problem List   Diagnosis Date Noted   Anemia in chronic kidney disease 12/21/2021   Chronic kidney disease, stage 3a ([place]) 12/21/2021   History of CVA with residual deficit 07/01/2019   Morbid obesity ([place]) 10/22/2018   Dyslipidemia associated with type 2 diabetes mellitus ([place]) 12/04/2016   Controlled type 2 diabetes mellitus with microalbuminuria ([place]) 12/04/2016   Seasonal allergies 02/01/2016   OSA (obstructive sleep apnea) 10/06/2015   Hypertensive retinopathy 10/06/2015   Thyroid cyst 10/06/2015   Dyslipidemia 10/06/2015   Controlled type 2 diabetes with neuropathy ([employer]) 10/06/2015   History of pneumonia 10/06/2015   Chronic neck pain 10/06/2015   Carotid stenosis    Hypertension, benign     Past Surgical History:  Procedure Laterality Date   APPENDECTOMY  2000s   BIOPSY THYROID  2006   [facility]   BREAST EXCISIONAL BIOPSY Left 2000s   neg   CARPAL  TUNNEL RELEASE Left 12/2013   CATARACT EXTRACTION, BILATERAL Bilateral    Right 12/14/21 and Left 01/04/22   COLONOSCOPY  2011   DR[facility]   COLONOSCOPY WITH PROPOFOL N/A 07/11/2017   Procedure: COLONOSCOPY WITH PROPOFOL;  Surgeon: Jonathon Bellows, MD;  Location: [facility] ENDOSCOPY;  Service: Gastroenterology;  Laterality: N/A;   ganglion cyst removal      KNEE SURGERY Left 08/2009   KNEE SURGERY Right 04/07/2010   REFRACTIVE SURGERY Bilateral    ROTATOR CUFF REPAIR Right 2008   TUBAL LIGATION      Family History  Problem Relation Age of Onset   Breast cancer Maternal Aunt    Hypertension Mother    Kidney Stones Mother    Hypertension Father    Congestive Heart Failure Father    Hypertension Paternal Grandfather    Diabetes Sister     Vision loss Sister    Varicose Veins Sister    Hypertension Sister    Breast cancer Sister 56       Double Mastectomy   Hypertension Sister     Social History   Tobacco Use   Smoking status: Never   Smokeless tobacco: Never  Substance Use Topics   Alcohol use: No     Current Outpatient Medications:    BD PEN NEEDLE NANO 2ND GEN 32G X 4 MM MISC, USE AS DIRECTED DAILY, Disp: 100 each, Rfl: 11   blood glucose meter kit and supplies, Dispense based on patient and insurance preference. Use up to four times daily as directed. (FOR ICD-10 E10.9, E11.9)., Disp: 1 each, Rfl: 0   carvedilol (COREG) 6.25 MG tablet, TAKE 1 TABLET BY MOUTH 2 TIMES DAILY WITH A MEAL., Disp: 180 tablet, Rfl: 1   docusate sodium (COLACE) 100 MG capsule, Take 1 capsule (100 mg total) by mouth daily as needed for mild constipation., Disp: 60 capsule, Rfl: 0   dorzolamide-timolol (COSOPT) 22.3-6.8 MG/ML ophthalmic solution, SMARTSIG:In Eye(s), Disp: , Rfl:    hydrALAZINE (APRESOLINE) 10 MG tablet, Take 1 tablet (10 mg total) by mouth 3 (three) times daily as needed. If bp goes above 150/90, Disp: 30 tablet, Rfl: 0   levocetirizine (XYZAL) 5 MG tablet, Take 5 mg by mouth daily as needed for allergies. , Disp: , Rfl:    ONETOUCH ULTRA test strip, USE UP TO 4 TIMES DAILY AS DIRECTED, Disp: 200 strip, Rfl: 2   polyethylene glycol (MIRALAX / GLYCOLAX) 17 g packet, Take 17 g by mouth daily., Disp: 14 each, Rfl: 0   amLODipine (NORVASC) 5 MG tablet, Take 1 tablet (5 mg total) by mouth daily., Disp: 90 tablet, Rfl: 1   atorvastatin (LIPITOR) 80 MG tablet, TAKE 1 TABLET BY MOUTH DAILY AT 6 PM., Disp: 90 tablet, Rfl: 1   clopidogrel (PLAVIX) 75 MG tablet, Take 1 tablet (75 mg total) by mouth daily., Disp: 90 tablet, Rfl: 1   insulin detemir (LEVEMIR FLEXTOUCH) 100 UNIT/ML FlexPen, Inject 24-50 Units into the skin daily after breakfast., Disp: 15 mL, Rfl: 2   metFORMIN (GLUCOPHAGE-XR) 750 MG 24 hr tablet, Take 1 tablet (750 mg  total) by mouth daily with breakfast., Disp: 90 tablet, Rfl: 1   olmesartan-hydrochlorothiazide (BENICAR HCT) 40-25 MG tablet, Take 1 tablet by mouth daily., Disp: 90 tablet, Rfl: 1   Semaglutide (RYBELSUS) 14 MG TABS, Take 1 tablet (14 mg total) by mouth daily., Disp: 90 tablet, Rfl: 1  Allergies  Allergen Reactions   Jardiance [Empagliflozin] Rash   Amoxicillin     rash  Bee Venom Swelling and Other (See Comments)    Immediately takes Benadryl to counteract   [place] [Dapagliflozin] Rash    Pt reports that she developed a rash when she took [place] and she would like it to be added as an allergy   Keflex [Cephalexin] Rash    I personally reviewed active problem list, medication list, allergies, family history, social history, health maintenance with the patient/caregiver today.   ROS  Constitutional: Negative for fever or weight change.  Respiratory: Negative for cough and shortness of breath.   Cardiovascular: Negative for chest pain or palpitations.  Gastrointestinal: Negative for abdominal pain, no bowel changes.  Musculoskeletal: Negative for gait problem or joint swelling.  Skin: Negative for rash.  Neurological: Negative for dizziness or headache.  No other specific complaints in a complete review of systems (except as listed in HPI above).   Objective  Vitals:   02/28/22 1411  BP: 122/70  Pulse: 81  Resp: 16  SpO2: 97%  Weight: 204 lb (92.5 kg)  Height: '5\' 4"'  (1.626 m)    Body mass index is 35.02 kg/m.  Physical Exam  Constitutional: Patient appears well-developed and well-nourished. Obese  No distress.  HEENT: head atraumatic, normocephalic, pupils equal and reactive to light, neck supple, throat within normal limits Cardiovascular: Normal rate, regular rhythm and normal heart sounds.  No murmur heard. No BLE edema. Pulmonary/Chest: Effort normal and breath sounds normal. No respiratory distress. Abdominal: Soft.  There is no tenderness. Psychiatric:  Patient has a normal mood and affect. behavior is normal. Judgment and thought content normal.   Recent Results (from the past 2160 hour(s))  POCT HgB A1C     Status: Abnormal   Collection Time: 02/28/22  2:15 PM  Result Value Ref Range   Hemoglobin A1C 6.2 (A) 4.0 - 5.6 %   HbA1c POC (<> result, manual entry)     HbA1c, POC (prediabetic range)     HbA1c, POC (controlled diabetic range)      Diabetic Foot Exam: Diabetic Foot Exam - Simple   Simple Foot Form Visual Inspection No deformities, no ulcerations, no other skin breakdown bilaterally: Yes Sensation Testing Intact to touch and monofilament testing bilaterally: Yes Pulse Check Posterior Tibialis and Dorsalis pulse intact bilaterally: Yes Comments      PHQ2/9:    02/28/2022    2:06 PM 11/21/2021    3:38 PM 11/06/2021    2:06 PM 07/05/2021    2:16 PM 03/07/2021    2:40 PM  Depression screen PHQ 2/9  Decreased Interest 0 0 0 0 0  Down, Depressed, Hopeless 0 0 0 0 0  PHQ - 2 Score 0 0 0 0 0  Altered sleeping   0    Tired, decreased energy   0    Change in appetite   0    Feeling bad or failure about yourself    0    Trouble concentrating   0    Moving slowly or fidgety/restless   0    Suicidal thoughts   0    PHQ-9 Score   0      phq 9 is negative   Fall Risk:    02/28/2022    2:06 PM 11/21/2021    3:41 PM 11/06/2021    2:06 PM 07/05/2021    2:16 PM 03/07/2021    2:39 PM  Fall Risk   Falls in the past year? 0 0 0 0 0  Number falls in past yr: 0 0  0 0 0  Injury with Fall? 0 0 0 0 0  Risk for fall due to : No Fall Risks No Fall Risks No Fall Risks No Fall Risks   Follow up Falls prevention discussed Falls prevention discussed Falls prevention discussed Falls prevention discussed       Functional Status Survey: Is the patient deaf or have difficulty hearing?: No Does the patient have difficulty seeing, even when wearing glasses/contacts?: Yes Does the patient have difficulty concentrating, remembering, or  making decisions?: No Does the patient have difficulty walking or climbing stairs?: Yes Does the patient have difficulty dressing or bathing?: No Does the patient have difficulty doing errands alone such as visiting a doctor's office or shopping?: No    Assessment & Plan  Problem List Items Addressed This Visit     Carotid stenosis    She will see vascular surgeon this Summer       Relevant Medications   olmesartan-hydrochlorothiazide (BENICAR HCT) 40-25 MG tablet   atorvastatin (LIPITOR) 80 MG tablet   amLODipine (NORVASC) 5 MG tablet   OSA (obstructive sleep apnea)    Sees Dr. Humphrey Rolls and is compliant with CPAP       Hypertensive retinopathy    Keep follow up with retina sub-specialist        Dyslipidemia associated with type 2 diabetes mellitus ([place])    Continue statin therapy        Relevant Medications   metFORMIN (GLUCOPHAGE-XR) 750 MG 24 hr tablet   olmesartan-hydrochlorothiazide (BENICAR HCT) 40-25 MG tablet   atorvastatin (LIPITOR) 80 MG tablet   insulin detemir (LEVEMIR FLEXTOUCH) 100 UNIT/ML FlexPen   Semaglutide (RYBELSUS) 14 MG TABS   Controlled type 2 diabetes mellitus with microalbuminuria (HCC) - Primary   Relevant Medications   metFORMIN (GLUCOPHAGE-XR) 750 MG 24 hr tablet   olmesartan-hydrochlorothiazide (BENICAR HCT) 40-25 MG tablet   atorvastatin (LIPITOR) 80 MG tablet   insulin detemir (LEVEMIR FLEXTOUCH) 100 UNIT/ML FlexPen   Semaglutide (RYBELSUS) 14 MG TABS   Other Relevant Orders   POCT HgB A1C (Completed)   Morbid obesity (HCC)    BMI above 35, losing weight, continue Rybelsus and life style modification       Relevant Medications   metFORMIN (GLUCOPHAGE-XR) 750 MG 24 hr tablet   insulin detemir (LEVEMIR FLEXTOUCH) 100 UNIT/ML FlexPen   Semaglutide (RYBELSUS) 14 MG TABS   Chronic kidney disease, stage 3a (HCC)   History of CVA with residual deficit   Relevant Medications   clopidogrel (PLAVIX) 75 MG tablet   atorvastatin (LIPITOR)  80 MG tablet   Hypertension, benign   Relevant Medications   olmesartan-hydrochlorothiazide (BENICAR HCT) 40-25 MG tablet   atorvastatin (LIPITOR) 80 MG tablet   amLODipine (NORVASC) 5 MG tablet

## 2022-02-28 ENCOUNTER — Ambulatory Visit: Payer: Medicare PPO | Admitting: Family Medicine

## 2022-02-28 ENCOUNTER — Encounter: Payer: Self-pay | Admitting: Family Medicine

## 2022-02-28 ENCOUNTER — Other Ambulatory Visit: Payer: Self-pay | Admitting: Family Medicine

## 2022-02-28 VITALS — BP 122/70 | HR 81 | Resp 16 | Ht 64.0 in | Wt 204.0 lb

## 2022-02-28 DIAGNOSIS — N1831 Chronic kidney disease, stage 3a: Secondary | ICD-10-CM | POA: Diagnosis not present

## 2022-02-28 DIAGNOSIS — R809 Proteinuria, unspecified: Secondary | ICD-10-CM

## 2022-02-28 DIAGNOSIS — I6930 Unspecified sequelae of cerebral infarction: Secondary | ICD-10-CM

## 2022-02-28 DIAGNOSIS — Z794 Long term (current) use of insulin: Secondary | ICD-10-CM

## 2022-02-28 DIAGNOSIS — E1129 Type 2 diabetes mellitus with other diabetic kidney complication: Secondary | ICD-10-CM

## 2022-02-28 DIAGNOSIS — E785 Hyperlipidemia, unspecified: Secondary | ICD-10-CM

## 2022-02-28 DIAGNOSIS — E1169 Type 2 diabetes mellitus with other specified complication: Secondary | ICD-10-CM | POA: Diagnosis not present

## 2022-02-28 DIAGNOSIS — H35033 Hypertensive retinopathy, bilateral: Secondary | ICD-10-CM

## 2022-02-28 DIAGNOSIS — G4733 Obstructive sleep apnea (adult) (pediatric): Secondary | ICD-10-CM | POA: Diagnosis not present

## 2022-02-28 DIAGNOSIS — I10 Essential (primary) hypertension: Secondary | ICD-10-CM

## 2022-02-28 DIAGNOSIS — I6523 Occlusion and stenosis of bilateral carotid arteries: Secondary | ICD-10-CM

## 2022-02-28 LAB — POCT GLYCOSYLATED HEMOGLOBIN (HGB A1C): Hemoglobin A1C: 6.2 % — AB (ref 4.0–5.6)

## 2022-02-28 MED ORDER — OLMESARTAN MEDOXOMIL-HCTZ 40-25 MG PO TABS: 1.0000 | ORAL_TABLET | Freq: Every day | ORAL | 1 refills | Status: DC

## 2022-02-28 MED ORDER — CLOPIDOGREL BISULFATE 75 MG PO TABS: 75.0000 mg | ORAL_TABLET | Freq: Every day | ORAL | 1 refills | Status: DC

## 2022-02-28 MED ORDER — METFORMIN HCL ER 750 MG PO TB24: 750.0000 mg | ORAL_TABLET | Freq: Every day | ORAL | 1 refills | Status: DC

## 2022-02-28 MED ORDER — LEVEMIR FLEXTOUCH 100 UNIT/ML ~~LOC~~ SOPN: 24.0000 [IU] | PEN_INJECTOR | Freq: Every day | SUBCUTANEOUS | 2 refills | Status: DC

## 2022-02-28 MED ORDER — AMLODIPINE BESYLATE 5 MG PO TABS: 5.0000 mg | ORAL_TABLET | Freq: Every day | ORAL | 1 refills | Status: DC

## 2022-02-28 MED ORDER — ATORVASTATIN CALCIUM 80 MG PO TABS: ORAL_TABLET | ORAL | 1 refills | Status: DC

## 2022-02-28 MED ORDER — RYBELSUS 14 MG PO TABS: 1.0000 | ORAL_TABLET | Freq: Every day | ORAL | 1 refills | Status: DC

## 2022-02-28 NOTE — Assessment & Plan Note (Signed)
Continue statin therapy.

## 2022-02-28 NOTE — Assessment & Plan Note (Signed)
Keep follow up with retina sub-specialist

## 2022-02-28 NOTE — Assessment & Plan Note (Signed)
Sees Dr. Humphrey Rolls and is compliant with CPAP

## 2022-02-28 NOTE — Assessment & Plan Note (Signed)
She will see vascular surgeon this Summer

## 2022-02-28 NOTE — Assessment & Plan Note (Signed)
BMI above 35, losing weight, continue Rybelsus and life style modification

## 2022-03-01 ENCOUNTER — Other Ambulatory Visit: Payer: Self-pay | Admitting: Family Medicine

## 2022-03-01 MED ORDER — TOUJEO MAX SOLOSTAR 300 UNIT/ML ~~LOC~~ SOPN: 24.0000 [IU] | PEN_INJECTOR | Freq: Every day | SUBCUTANEOUS | 0 refills | Status: DC

## 2022-03-15 DIAGNOSIS — E113311 Type 2 diabetes mellitus with moderate nonproliferative diabetic retinopathy with macular edema, right eye: Secondary | ICD-10-CM | POA: Diagnosis not present

## 2022-03-28 ENCOUNTER — Other Ambulatory Visit: Payer: Self-pay | Admitting: Family Medicine

## 2022-04-05 ENCOUNTER — Ambulatory Visit
Admission: RE | Admit: 2022-04-05 | Discharge: 2022-04-05 | Disposition: A | Discharge status: Home / Self Care | Payer: Medicare PPO | Source: Ambulatory Visit | Attending: Family Medicine | Admitting: Family Medicine

## 2022-04-05 DIAGNOSIS — Z1231 Encounter for screening mammogram for malignant neoplasm of breast: Secondary | ICD-10-CM | POA: Diagnosis not present

## 2022-04-09 ENCOUNTER — Encounter: Payer: Self-pay | Admitting: Nurse Practitioner

## 2022-04-12 DIAGNOSIS — E113312 Type 2 diabetes mellitus with moderate nonproliferative diabetic retinopathy with macular edema, left eye: Secondary | ICD-10-CM | POA: Diagnosis not present

## 2022-04-12 DIAGNOSIS — E113313 Type 2 diabetes mellitus with moderate nonproliferative diabetic retinopathy with macular edema, bilateral: Secondary | ICD-10-CM | POA: Diagnosis not present

## 2022-04-16 ENCOUNTER — Ambulatory Visit (INDEPENDENT_AMBULATORY_CARE_PROVIDER_SITE_OTHER): Payer: Medicare Other | Admitting: Nurse Practitioner

## 2022-04-16 ENCOUNTER — Encounter (INDEPENDENT_AMBULATORY_CARE_PROVIDER_SITE_OTHER): Payer: Medicare Other

## 2022-04-17 ENCOUNTER — Ambulatory Visit (INDEPENDENT_AMBULATORY_CARE_PROVIDER_SITE_OTHER): Payer: Medicare PPO

## 2022-04-17 ENCOUNTER — Encounter (INDEPENDENT_AMBULATORY_CARE_PROVIDER_SITE_OTHER): Payer: Self-pay | Admitting: Nurse Practitioner

## 2022-04-17 ENCOUNTER — Ambulatory Visit (INDEPENDENT_AMBULATORY_CARE_PROVIDER_SITE_OTHER): Payer: Medicare PPO | Admitting: Nurse Practitioner

## 2022-04-17 ENCOUNTER — Other Ambulatory Visit (INDEPENDENT_AMBULATORY_CARE_PROVIDER_SITE_OTHER): Payer: Self-pay | Admitting: Nurse Practitioner

## 2022-04-17 VITALS — BP 123/75 | HR 88 | Resp 17 | Ht 64.0 in | Wt 203.0 lb

## 2022-04-17 DIAGNOSIS — I10 Essential (primary) hypertension: Secondary | ICD-10-CM

## 2022-04-17 DIAGNOSIS — I779 Disorder of arteries and arterioles, unspecified: Secondary | ICD-10-CM

## 2022-04-17 DIAGNOSIS — E785 Hyperlipidemia, unspecified: Secondary | ICD-10-CM | POA: Diagnosis not present

## 2022-04-20 ENCOUNTER — Ambulatory Visit: Payer: Medicare PPO | Admitting: Cardiology

## 2022-04-20 ENCOUNTER — Encounter: Payer: Self-pay | Admitting: Cardiology

## 2022-04-20 VITALS — BP 132/80 | HR 76 | Ht 64.0 in | Wt 204.6 lb

## 2022-04-20 DIAGNOSIS — I10 Essential (primary) hypertension: Secondary | ICD-10-CM | POA: Diagnosis not present

## 2022-04-20 DIAGNOSIS — I6930 Unspecified sequelae of cerebral infarction: Secondary | ICD-10-CM

## 2022-04-20 NOTE — Patient Instructions (Signed)
Medication Instructions:   Your physician recommends that you continue on your current medications as directed. Please refer to the Current Medication list given to you today.  *If you need a refill on your cardiac medications before your next appointment, please call your pharmacy*   Follow-Up: At [facility], you and your health needs are our priority.  As part of our continuing mission to provide you with exceptional heart care, we have created designated Provider Care Teams.  These Care Teams include your primary Cardiologist (physician) and Advanced Practice Providers (APPs -  Physician Assistants and Nurse Practitioners) who all work together to provide you with the care you need, when you need it.  We recommend signing up for the patient portal called "MyChart".  Sign up information is provided on this After Visit Summary.  MyChart is used to connect with patients for Virtual Visits (Telemedicine).  Patients are able to view lab/test results, encounter notes, upcoming appointments, etc.  Non-urgent messages can be sent to your provider as well.   To learn more about what you can do with MyChart, go to [email or web address].    Your next appointment:   Follow up as needed   The format for your next appointment:   In Person  Provider:   Kate Sable, MD    Other Instructions   Important Information About Sugar

## 2022-04-20 NOTE — Progress Notes (Signed)
Cardiology Office Note:    Date:  04/20/2022   ID:  Meagan [name], DOB 1951-01-11, MRN [id number]  PCP:  Steele Sizer, MD  Cardiologist:  Kate Sable, MD  Electrophysiologist:  None   Referring MD: Steele Sizer, MD   Chief Complaint  Patient presents with   Follow-up    Yearly follow up. Patient states that the arthritis in her hands is bothering her. Meds reviewed with patient.     History of Present Illness:    Meagan [name] is a 71 y.o. female with a hx of hypertension, diabetes, CVA who presents for follow-up.    Previously seen due to CVA, cardiac monitor did not reveal any A-fib or flutter.  Echo 11/20 showed normal EF.  Blood pressure adequately controlled on current medications.  She has been working with physical therapy, eventually discharged from physical therapy.  Has been very active, recently joined a local gymnasium.  Also volunteers at a local school.  She is a retired Radio producer, stays active with work.  States walking usually with a walker because sometimes she may have a wobbly gait.  Denies any falls.  Denies any weakness in her arms.  Takes all medications as prescribed.  Prior notes Cardiac monitor 08/2019 no arrhythmias, no atrial fibrillation. Echo 06/2019 EF normal, 60 to 65%, impaired relaxation   Past Medical History:  Diagnosis Date   Cellulitis    left leg   Diabetes mellitus without complication ([place])    non-insulin dependent   Diffuse cystic mastopathy    Fall 2013   Hypertension 2004   Occlusion and stenosis of carotid artery without mention of cerebral infarction    Sleep apnea    Stroke [facility])     Past Surgical History:  Procedure Laterality Date   APPENDECTOMY  2000s   BIOPSY THYROID  2006   [facility]   BREAST EXCISIONAL BIOPSY Left 2000s   neg   CARPAL TUNNEL RELEASE Left 12/2013   CATARACT EXTRACTION, BILATERAL Bilateral    Right 12/14/21 and Left 01/04/22   COLONOSCOPY  2011   DR[facility]WOHL   COLONOSCOPY WITH PROPOFOL N/A  07/11/2017   Procedure: COLONOSCOPY WITH PROPOFOL;  Surgeon: Jonathon Bellows, MD;  Location[facility] ENDOSCOPY;  Service: Gastroenterology;  Laterality: N/A;   ganglion cyst removal      KNEE SURGERY Left 08/2009   KNEE SURGERY Right 04/07/2010   REFRACTIVE SURGERY Bilateral    ROTATOR CUFF REPAIR Right 2008   TUBAL LIGATION      Current Medications: Current Meds  Medication Sig   amLODipine (NORVASC) 5 MG tablet Take 1 tablet (5 mg total) by mouth daily.   atorvastatin (LIPITOR) 80 MG tablet TAKE 1 TABLET BY MOUTH DAILY AT 6 PM.   BD PEN NEEDLE NANO 2ND GEN 32G X 4 MM MISC USE AS DIRECTED DAILY   blood glucose meter kit and supplies Dispense based on patient and insurance preference. Use up to four times daily as directed. (FOR ICD-10 E10.9, E11.9).   carvedilol (COREG) 6.25 MG tablet TAKE 1 TABLET BY MOUTH 2 TIMES DAILY WITH A MEAL.   clopidogrel (PLAVIX) 75 MG tablet Take 1 tablet (75 mg total) by mouth daily.   docusate sodium (COLACE) 100 MG capsule Take 1 capsule (100 mg total) by mouth daily as needed for mild constipation.   dorzolamide-timolol (COSOPT) 22.3-6.8 MG/ML ophthalmic solution SMARTSIG:In Eye(s)   levocetirizine (XYZAL) 5 MG tablet Take 5 mg by mouth daily as needed for allergies.    metFORMIN (GLUCOPHAGE-XR) 750  MG 24 hr tablet Take 1 tablet (750 mg total) by mouth daily with breakfast.   olmesartan-hydrochlorothiazide (BENICAR HCT) 40-25 MG tablet Take 1 tablet by mouth daily.   ONETOUCH ULTRA test strip USE UP TO 4 TIMES DAILY AS DIRECTED   polyethylene glycol (MIRALAX / GLYCOLAX) 17 g packet Take 17 g by mouth daily.   Semaglutide (RYBELSUS) 14 MG TABS Take 1 tablet (14 mg total) by mouth daily.   TOUJEO MAX SOLOSTAR 300 UNIT/ML Solostar Pen INJECT 24-50 UNITS INTO THE SKIN DAILY AT 12 NOON. IN PLACE OF TRESIBA     Allergies:   Jardiance [empagliflozin], Amoxicillin, Bee venom, Trulicity [dulaglutide], Farxiga [dapagliflozin], and Keflex [cephalexin]   Social History    Socioeconomic History   Marital status: Widowed    Spouse name: Not on file   Number of children: 4   Years of education: Not on file   Highest education level: Master's degree (e.g., MA, MS, MEng, MEd, MSW, MBA)  Occupational History   Occupation: Higher education careers adviser   Tobacco Use   Smoking status: Never   Smokeless tobacco: Never  Vaping Use   Vaping Use: Never used  Substance and Sexual Activity   Alcohol use: No   Drug use: No   Sexual activity: Not Currently    Partners: Male    Comment: Widow for 14 years  Other Topics Concern   Not on file  Social History Narrative   She had a stroke on 06/2019, she has left peripheral vision loss since stroke   She moved in with her youngest sister    Social Determinants of Health   Financial Resource Strain: Low Risk  (11/21/2021)   Overall Financial Resource Strain (CARDIA)    Difficulty of Paying Living Expenses: Not hard at all  Food Insecurity: No Food Insecurity (11/21/2021)   Hunger Vital Sign    Worried About Running Out of Food in the Last Year: Never true    Ran Out of Food in the Last Year: Never true  Transportation Needs: No Transportation Needs (11/21/2021)   PRAPARE - Hydrologist (Medical): No    Lack of Transportation (Non-Medical): No  Physical Activity: Inactive (11/21/2021)   Exercise Vital Sign    Days of Exercise per Week: 0 days    Minutes of Exercise per Session: 0 min  Stress: No Stress Concern Present (11/21/2021)   [place]    Feeling of Stress : Not at all  Social Connections: Moderately Integrated (11/21/2021)   Social Connection and Isolation Panel [NHANES]    Frequency of Communication with Friends and Family: More than three times a week    Frequency of Social Gatherings with Friends and Family: More than three times a week    Attends Religious Services: More than 4 times per year    Active Member of [employer] or  Organizations: Yes    Attends Archivist Meetings: More than 4 times per year    Marital Status: Widowed     Family History: The patient's family history includes Breast cancer in her maternal aunt; Breast cancer (age of onset: 48) in her sister; Congestive Heart Failure in her father; Diabetes in her sister; Hypertension in her father, mother, paternal grandfather, sister, and sister; Kidney Stones in her mother; Varicose Veins in her sister; Vision loss in her sister.  ROS:   Please see the history of present illness.     All other systems  reviewed and are negative.  EKGs/Labs/Other Studies Reviewed:    The following studies were reviewed today:   EKG:  EKG is  ordered today.  The ekg ordered today demonstrates normal sinus rhythm, normal ECG  Recent Labs: 11/06/2021: ALT 16; BUN 17; Creat 1.18; Hemoglobin 11.0; Platelets 298; Potassium 3.7; Sodium 142  Recent Lipid Panel    Component Value Date/Time   CHOL 91 03/07/2021 1538   CHOL 200 (H) 01/31/2016 1115   TRIG 126 03/07/2021 1538   HDL 33 (L) 03/07/2021 1538   HDL 34 (L) 01/31/2016 1115   CHOLHDL 2.8 03/07/2021 1538   VLDL 36 07/02/2019 0411   LDLCALC 37 03/07/2021 1538    Physical Exam:    VS:  BP 132/80 (BP Location: Left Arm, Patient Position: Sitting, Cuff Size: Normal)   Pulse 76   Ht '5\' 4"'  (1.626 m)   Wt 204 lb 9.6 oz (92.8 kg)   SpO2 99%   BMI 35.12 kg/m     Wt Readings from Last 3 Encounters:  04/20/22 204 lb 9.6 oz (92.8 kg)  04/17/22 203 lb (92.1 kg)  02/28/22 204 lb (92.5 kg)     GEN:  Well nourished, well developed in no acute distress, HEENT: Normal NECK: No JVD; No carotid bruits CARDIAC: RRR, no murmurs, rubs, gallops RESPIRATORY:  Clear to auscultation without rales, wheezing or rhonchi  ABDOMEN: Soft, non-tender, non-distended MUSCULOSKELETAL:  No edema; left arm weakness noted SKIN: Warm and dry NEUROLOGIC:  Alert and oriented x 3 PSYCHIATRIC:  Normal affect   ASSESSMENT:     1. Primary hypertension   2. History of CVA with residual deficit    PLAN:    Hypertension.  BP controlled, continue Coreg, amlodipine Benicar/hct History of CVA, left-sided weakness.  Prior cardiac monitor with no A. fib or flutter.  Continue Plavix, Lipitor.  LDL at goal.    Follow-up prn  Total encounter time more than 30 minutes  Greater than 50% was spent in counseling and coordination of care with the patient  This note was generated in part or whole with voice recognition software. Voice recognition is usually quite accurate but there are transcription errors that can and very often do occur. I apologize for any typographical errors that were not detected and corrected.   Medication Adjustments/Labs and Tests Ordered: Current medicines are reviewed at length with the patient today.  Concerns regarding medicines are outlined above.  Orders Placed This Encounter  Procedures   EKG 12-Lead   No orders of the defined types were placed in this encounter.   Patient Instructions  Medication Instructions:   Your physician recommends that you continue on your current medications as directed. Please refer to the Current Medication list given to you today.  *If you need a refill on your cardiac medications before your next appointment, please call your pharmacy*   Follow-Up: At [facility], you and your health needs are our priority.  As part of our continuing mission to provide you with exceptional heart care, we have created designated Provider Care Teams.  These Care Teams include your primary Cardiologist (physician) and Advanced Practice Providers (APPs -  Physician Assistants and Nurse Practitioners) who all work together to provide you with the care you need, when you need it.  We recommend signing up for the patient portal called "MyChart".  Sign up information is provided on this After Visit Summary.  MyChart is used to connect with patients for Virtual Visits  (Telemedicine).  Patients are able  to view lab/test results, encounter notes, upcoming appointments, etc.  Non-urgent messages can be sent to your provider as well.   To learn more about what you can do with MyChart, go to [email or web address].    Your next appointment:   Follow up as needed   The format for your next appointment:   In Person  Provider:   Kate Sable, MD    Other Instructions   Important Information About Sugar         Signed, Kate Sable, MD  04/20/2022 3:06 PM    [place]

## 2022-04-30 ENCOUNTER — Encounter (INDEPENDENT_AMBULATORY_CARE_PROVIDER_SITE_OTHER): Payer: Self-pay | Admitting: Nurse Practitioner

## 2022-04-30 NOTE — Progress Notes (Signed)
Subjective:    Patient ID: Meagan [name], female    DOB: [birth year], 71 y.o.   MRN: [id number] No chief complaint on file.   The patient is seen for follow up evaluation of carotid stenosis. The carotid stenosis followed by ultrasound.   The patient denies amaurosis fugax. There is no recent history of TIA symptoms or focal motor deficits. There is no prior documented CVA.  The patient is taking enteric-coated aspirin 81 mg daily.  There is no history of migraine headaches. There is no history of seizures.  No recent shortening of the patient's walking distance or new symptoms consistent with claudication.  No history of rest pain symptoms. No new ulcers or wounds of the lower extremities have occurred.  There is no history of DVT, PE or superficial thrombophlebitis. No recent episodes of angina or shortness of breath documented.   Carotid Duplex done today shows 1 to 39% bilaterally.  No change compared to last study in 2022     Review of Systems  Eyes:  Positive for visual disturbance.  All other systems reviewed and are negative.      Objective:   Physical Exam Vitals reviewed.  Neck:     Vascular: Carotid bruit present.  Cardiovascular:     Rate and Rhythm: Normal rate.     Pulses:          Radial pulses are 1+ on the right side and 1+ on the left side.  Pulmonary:     Effort: Pulmonary effort is normal.  Skin:    General: Skin is warm and dry.  Neurological:     Mental Status: She is alert and oriented to person, place, and time.  Psychiatric:        Mood and Affect: Mood normal.        Behavior: Behavior normal.        Thought Content: Thought content normal.     BP 123/75 (BP Location: Left Arm)   Pulse 88   Resp 17   Ht '5\' 4"'  (1.626 m)   Wt 203 lb (92.1 kg)   BMI 34.84 kg/m   Past Medical History:  Diagnosis Date   Cellulitis    left leg   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   Diffuse cystic mastopathy     Fall 2013   Hypertension 2004   Occlusion and stenosis of carotid artery without mention of cerebral infarction    Sleep apnea    Stroke [facility])     Social History   Socioeconomic History   Marital status: Widowed    Spouse name: Not on file   Number of children: 4   Years of education: Not on file   Highest education level: Master's degree (e.g., MA, MS, MEng, MEd, MSW, MBA)  Occupational History   Occupation: Higher education careers adviser   Tobacco Use   Smoking status: Never   Smokeless tobacco: Never  Vaping Use   Vaping Use: Never used  Substance and Sexual Activity   Alcohol use: No   Drug use: No   Sexual activity: Not Currently    Partners: Male    Comment: Widow for 14 years  Other Topics Concern   Not on file  Social History Narrative   She had a stroke on 06/2019, she has left peripheral vision loss since stroke   She moved in with her youngest sister    Social Determinants of Health   Financial Resource Strain: Low Risk  (  11/21/2021)   Overall Financial Resource Strain (CARDIA)    Difficulty of Paying Living Expenses: Not hard at all  Food Insecurity: No Food Insecurity (11/21/2021)   Hunger Vital Sign    Worried About Running Out of Food in the Last Year: Never true    Ran Out of Food in the Last Year: Never true  Transportation Needs: No Transportation Needs (11/21/2021)   PRAPARE - Hydrologist (Medical): No    Lack of Transportation (Non-Medical): No  Physical Activity: Inactive (11/21/2021)   Exercise Vital Sign    Days of Exercise per Week: 0 days    Minutes of Exercise per Session: 0 min  Stress: No Stress Concern Present (11/21/2021)   [place]    Feeling of Stress : Not at all  Social Connections: Moderately Integrated (11/21/2021)   Social Connection and Isolation Panel [NHANES]    Frequency of Communication with Friends and Family: More than three times a week     Frequency of Social Gatherings with Friends and Family: More than three times a week    Attends Religious Services: More than 4 times per year    Active Member of [employer] or Organizations: Yes    Attends Archivist Meetings: More than 4 times per year    Marital Status: Widowed  Intimate Partner Violence: Not At Risk (11/21/2021)   Humiliation, Afraid, Rape, and Kick questionnaire    Fear of Current or Ex-Partner: No    Emotionally Abused: No    Physically Abused: No    Sexually Abused: No    Past Surgical History:  Procedure Laterality Date   APPENDECTOMY  2000s   BIOPSY THYROID  2006   [facility]   BREAST EXCISIONAL BIOPSY Left 2000s   neg   CARPAL TUNNEL RELEASE Left 12/2013   CATARACT EXTRACTION, BILATERAL Bilateral    Right 12/14/21 and Left 01/04/22   COLONOSCOPY  2011   DR.WOHL   COLONOSCOPY WITH PROPOFOL N/A 07/11/2017   Procedure: COLONOSCOPY WITH PROPOFOL;  Surgeon: Jonathon Bellows, MD;  Location: [facility] ENDOSCOPY;  Service: Gastroenterology;  Laterality: N/A;   ganglion cyst removal      KNEE SURGERY Left 08/2009   KNEE SURGERY Right 04/07/2010   REFRACTIVE SURGERY Bilateral    ROTATOR CUFF REPAIR Right 2008   TUBAL LIGATION      Family History  Problem Relation Age of Onset   Breast cancer Maternal Aunt    Hypertension Mother    Kidney Stones Mother    Hypertension Father    Congestive Heart Failure Father    Hypertension Paternal Grandfather    Diabetes Sister    Vision loss Sister    Varicose Veins Sister    Hypertension Sister    Breast cancer Sister 4       Double Mastectomy   Hypertension Sister     Allergies  Allergen Reactions   Jardiance [Empagliflozin] Rash   Amoxicillin     rash   Bee Venom Swelling and Other (See Comments)    Immediately takes Benadryl to counteract   Trulicity [Dulaglutide] Hives   Farxiga [Dapagliflozin] Rash    Pt reports that she developed a rash when she took [place] and she would like it to be added as an allergy    Keflex [Cephalexin] Rash       Latest Ref Rng & Units 11/06/2021    2:48 PM 03/07/2021    3:38 PM 11/04/2020  2:55 PM  CBC  WBC 3.8 - 10.8 Thousand/uL 10.2  10.6  13.0   Hemoglobin 11.7 - 15.5 g/dL 11.0  10.9  11.2   Hematocrit 35.0 - 45.0 % 31.8  32.8  32.1   Platelets 140 - 400 Thousand/uL 298  269  264       CMP     Component Value Date/Time   NA 142 11/06/2021 1448   NA 138 01/31/2016 1115   K 3.7 11/06/2021 1448   CL 102 11/06/2021 1448   CO2 37 (H) 11/06/2021 1448   GLUCOSE 186 (H) 11/06/2021 1448   BUN 17 11/06/2021 1448   BUN 10 01/31/2016 1115   CREATININE 1.18 (H) 11/06/2021 1448   CALCIUM 9.3 11/06/2021 1448   PROT 6.9 11/06/2021 1448   PROT 7.3 01/31/2016 1115   ALBUMIN 3.8 12/02/2019 1738   ALBUMIN 4.1 01/31/2016 1115   AST 19 11/06/2021 1448   ALT 16 11/06/2021 1448   ALKPHOS 86 12/02/2019 1738   BILITOT 0.4 11/06/2021 1448   BILITOT 0.5 01/31/2016 1115   GFRNONAA 38 (L) 03/07/2021 1538   GFRAA 45 (L) 03/07/2021 1538     No results found.     Assessment & Plan:   1. Bilateral carotid artery disease, unspecified type ([place]) Recommend:  Given the patient's asymptomatic subcritical stenosis no further invasive testing or surgery at this time.  Duplex ultrasound shows <50% stenosis bilaterally.  Continue antiplatelet therapy as prescribed Continue management of CAD, HTN and Hyperlipidemia Healthy heart diet,  encouraged exercise at least 4 times per week Follow up in 12 months with duplex ultrasound and physical exam   - VAS US CAROTID  2. Hypertension, benign Continue antihypertensive medications as already ordered, these medications have been reviewed and there are no changes at this time.   3. Dyslipidemia Continue statin as ordered and reviewed, no changes at this time    Current Outpatient Medications on File Prior to Visit  Medication Sig Dispense Refill   amLODipine (NORVASC) 5 MG tablet Take 1 tablet (5 mg total) by mouth daily.  90 tablet 1   atorvastatin (LIPITOR) 80 MG tablet TAKE 1 TABLET BY MOUTH DAILY AT 6 PM. 90 tablet 1   BD PEN NEEDLE NANO 2ND GEN 32G X 4 MM MISC USE AS DIRECTED DAILY 100 each 11   blood glucose meter kit and supplies Dispense based on patient and insurance preference. Use up to four times daily as directed. (FOR ICD-10 E10.9, E11.9). 1 each 0   carvedilol (COREG) 6.25 MG tablet TAKE 1 TABLET BY MOUTH 2 TIMES DAILY WITH A MEAL. 180 tablet 1   clopidogrel (PLAVIX) 75 MG tablet Take 1 tablet (75 mg total) by mouth daily. 90 tablet 1   docusate sodium (COLACE) 100 MG capsule Take 1 capsule (100 mg total) by mouth daily as needed for mild constipation. 60 capsule 0   dorzolamide-timolol (COSOPT) 22.3-6.8 MG/ML ophthalmic solution SMARTSIG:In Eye(s)     levocetirizine (XYZAL) 5 MG tablet Take 5 mg by mouth daily as needed for allergies.      metFORMIN (GLUCOPHAGE-XR) 750 MG 24 hr tablet Take 1 tablet (750 mg total) by mouth daily with breakfast. 90 tablet 1   olmesartan-hydrochlorothiazide (BENICAR HCT) 40-25 MG tablet Take 1 tablet by mouth daily. 90 tablet 1   ONETOUCH ULTRA test strip USE UP TO 4 TIMES DAILY AS DIRECTED 200 strip 2   polyethylene glycol (MIRALAX / GLYCOLAX) 17 g packet Take 17 g by mouth daily. [place]  each 0   Semaglutide (RYBELSUS) 14 MG TABS Take 1 tablet (14 mg total) by mouth daily. 90 tablet 1   TOUJEO MAX SOLOSTAR 300 UNIT/ML Solostar Pen INJECT 24-50 UNITS INTO THE SKIN DAILY AT 12 NOON. IN PLACE OF TRESIBA 15 mL 0   hydrALAZINE (APRESOLINE) 10 MG tablet Take 1 tablet (10 mg total) by mouth 3 (three) times daily as needed. If bp goes above 150/90 (Patient not taking: Reported on 04/17/2022) 30 tablet 0   No current facility-administered medications on file prior to visit.    There are no Patient Instructions on file for this visit. No follow-ups on file.   Kris Hartmann, NP

## 2022-05-10 DIAGNOSIS — E113313 Type 2 diabetes mellitus with moderate nonproliferative diabetic retinopathy with macular edema, bilateral: Secondary | ICD-10-CM | POA: Diagnosis not present

## 2022-05-14 ENCOUNTER — Other Ambulatory Visit: Payer: Self-pay | Admitting: Family Medicine

## 2022-05-14 DIAGNOSIS — I152 Hypertension secondary to endocrine disorders: Secondary | ICD-10-CM

## 2022-05-14 NOTE — Telephone Encounter (Signed)
Last seen 5/24

## 2022-05-17 DIAGNOSIS — E113311 Type 2 diabetes mellitus with moderate nonproliferative diabetic retinopathy with macular edema, right eye: Secondary | ICD-10-CM | POA: Diagnosis not present

## 2022-05-24 DIAGNOSIS — N1831 Chronic kidney disease, stage 3a: Secondary | ICD-10-CM | POA: Diagnosis not present

## 2022-05-24 DIAGNOSIS — E663 Overweight: Secondary | ICD-10-CM | POA: Diagnosis not present

## 2022-05-24 DIAGNOSIS — I639 Cerebral infarction, unspecified: Secondary | ICD-10-CM | POA: Diagnosis not present

## 2022-05-24 DIAGNOSIS — E1122 Type 2 diabetes mellitus with diabetic chronic kidney disease: Secondary | ICD-10-CM | POA: Diagnosis not present

## 2022-05-24 DIAGNOSIS — I10 Essential (primary) hypertension: Secondary | ICD-10-CM | POA: Diagnosis not present

## 2022-05-24 DIAGNOSIS — D631 Anemia in chronic kidney disease: Secondary | ICD-10-CM | POA: Diagnosis not present

## 2022-05-24 DIAGNOSIS — R809 Proteinuria, unspecified: Secondary | ICD-10-CM | POA: Diagnosis not present

## 2022-05-24 DIAGNOSIS — G4733 Obstructive sleep apnea (adult) (pediatric): Secondary | ICD-10-CM | POA: Diagnosis not present

## 2022-05-24 DIAGNOSIS — E785 Hyperlipidemia, unspecified: Secondary | ICD-10-CM | POA: Diagnosis not present

## 2022-05-29 ENCOUNTER — Telehealth: Payer: Self-pay | Admitting: Family Medicine

## 2022-05-29 NOTE — Telephone Encounter (Signed)
Called and let patient know Dr. Ancil Boozer had me write the note to return to work 06/04/22. Patient states she's retired and can't see. I let her know if she doesn't need the note she can disregard it. Patient gave verbal understanding.

## 2022-05-29 NOTE — Telephone Encounter (Signed)
PA initiated

## 2022-05-29 NOTE — Telephone Encounter (Signed)
The patient called in stating her new insurance requires a prior a prior authorization for her one touch strips. She is having the insurance fax over forms to the provider. Please assist patient further.

## 2022-05-29 NOTE — Telephone Encounter (Unsigned)
Copied from [place] [id number]. Topic: General - Other >> May 29, 2022  2:21 PM Meagan [name] wrote: Reason for CRM: Patient called in about letter she received in my chart about her going back to work 08/28. She says she has been retired since 06/2020 and she cant see that well out of her eyes and can't go back to work and she is retired, so she isnt sure why she received the letter about going back to work. Her other doctors havent released her to go back to work anywhere either.

## 2022-05-29 NOTE — Telephone Encounter (Signed)
PA initiated through CoverMyMeds. I have not received any paperwork yet.

## 2022-05-29 NOTE — Telephone Encounter (Signed)
Pt now has [employer] and they do not cover One touch Ultra meter and test strips but the pt still wants to use this brand due to not being able to see well and this working best for her so [employer] needs a PA to continue to cover these / fax#  [phone] /please advise

## 2022-06-01 ENCOUNTER — Telehealth: Payer: Self-pay | Admitting: Family Medicine

## 2022-06-01 NOTE — Telephone Encounter (Signed)
Copied from [place] [id number]. Topic: General - Other >> Jun 01, 2022  3:35 PM Ja-Kwan M wrote: Reason for CRM: Pt stated she would like the return to work note removed from her [facility] because she has been retired since 2021 and she does not want that note listed.   Letter has been retracted.

## 2022-06-07 DIAGNOSIS — N1831 Chronic kidney disease, stage 3a: Secondary | ICD-10-CM | POA: Diagnosis not present

## 2022-06-07 DIAGNOSIS — I10 Essential (primary) hypertension: Secondary | ICD-10-CM | POA: Diagnosis not present

## 2022-06-07 DIAGNOSIS — E785 Hyperlipidemia, unspecified: Secondary | ICD-10-CM | POA: Diagnosis not present

## 2022-06-07 DIAGNOSIS — D631 Anemia in chronic kidney disease: Secondary | ICD-10-CM | POA: Diagnosis not present

## 2022-06-07 DIAGNOSIS — E663 Overweight: Secondary | ICD-10-CM | POA: Diagnosis not present

## 2022-06-07 DIAGNOSIS — G4733 Obstructive sleep apnea (adult) (pediatric): Secondary | ICD-10-CM | POA: Diagnosis not present

## 2022-06-07 DIAGNOSIS — R809 Proteinuria, unspecified: Secondary | ICD-10-CM | POA: Diagnosis not present

## 2022-06-07 DIAGNOSIS — E1122 Type 2 diabetes mellitus with diabetic chronic kidney disease: Secondary | ICD-10-CM | POA: Diagnosis not present

## 2022-06-07 DIAGNOSIS — I639 Cerebral infarction, unspecified: Secondary | ICD-10-CM | POA: Diagnosis not present

## 2022-06-21 DIAGNOSIS — E113312 Type 2 diabetes mellitus with moderate nonproliferative diabetic retinopathy with macular edema, left eye: Secondary | ICD-10-CM | POA: Diagnosis not present

## 2022-07-03 NOTE — Progress Notes (Unsigned)
Name: Meagan [name]   MRN: [id number]    DOB: 01-31-1951   Date:07/04/2022       Progress Note  Subjective  Chief Complaint  Follow Up  HPI  DMII: she states since she stopped Ozempic due to significant weight loss. She is currently taking Rybelsus 14 mg , A1C went from  6.9 % to 7.1 %, it went up to 7.%, changed her diet and A1C is down from  6.6 % to 6.2 % today A1C is 6.1 %  .She is currently taking Toujeo 26 units per day Rybelsus , Metformin just 750 mg daily  She has dyslipidemia, HTN and microalbuminuria, she is on ARB, statin therapy. FSBS has been well controlled. She is up to date with eye exam.   CKI stage III: now seeing Dr. Lanora Manis and last GFR done on 08/17 was up from 72 to 77 She is on ARB, she is unable to tolerate SGL2 agonists   HTN: bp at home has been under control No headaches, dizziness, chest pain or palpitation. She is on Benicar hctz 40/25, carvedilol BID and Norvasc,  she has hydralazine prn but no need to take it lately   S/p CVA: date of CVA was 07/01/2019, admitted from 07/01/2019 until 07/08/2019 and had inpatient rehab until 07/17/2019. She developed right occipital headaches and bp was spiking . She went to eye doctor and bp was over 200's , he called me and we advised to send her to [facility], she arrived to [facility] with her daughter. CTA showed 45 % stenosis proximal right internal carotid artery , 60 % stenosis right external carotid artery , left distal common external carotid was 30 % , echo showed normal EF, LDL was very high at 199 , she is now on Atorvastatin and last LDL down to 37 , she is also taking Plavix 75 mg . She has regained some vision but still has some  peripheral vision loss on both sides, but worse on left side. She lives with her sister and needs assistance cooking, and is still not driving  She sees a retina sub-specialist and is visually impaired   s/p cataract surgery , also has laser therapy on a regular basis . She is compliant with visits  OSA:  seen by Dr. Humphrey Rolls and had sleep study, wearing cpap every night, tolerating it well. She has been compliant with her CPAP machine    Morbid obesity: she has a BMI above 35 and multiple co-morbidities, she is on Rybelsus since January 2023  and has lost 15 lbs since started on therapy   Patient Active Problem List   Diagnosis Date Noted   Anemia in chronic kidney disease 12/21/2021   Chronic kidney disease, stage 3a ([place]) 12/21/2021   History of CVA with residual deficit 07/01/2019   Morbid obesity ([place]) 10/22/2018   Dyslipidemia associated with type 2 diabetes mellitus ([place]) 12/04/2016   Controlled type 2 diabetes mellitus with microalbuminuria ([place]) 12/04/2016   Seasonal allergies 02/01/2016   OSA (obstructive sleep apnea) 10/06/2015   Hypertensive retinopathy 10/06/2015   Thyroid cyst 10/06/2015   Dyslipidemia 10/06/2015   Controlled type 2 diabetes with neuropathy ([place]) 10/06/2015   History of pneumonia 10/06/2015   Chronic neck pain 10/06/2015   Carotid stenosis    Hypertension, benign     Past Surgical History:  Procedure Laterality Date   APPENDECTOMY  2000s   BIOPSY THYROID  2006   [facility]   BREAST EXCISIONAL BIOPSY Left 2000s   neg  CARPAL TUNNEL RELEASE Left 12/2013   CATARACT EXTRACTION, BILATERAL Bilateral    Right 12/14/21 and Left 01/04/22   COLONOSCOPY  2011   DR.WOHL   COLONOSCOPY WITH PROPOFOL N/A 07/11/2017   Procedure: COLONOSCOPY WITH PROPOFOL;  Surgeon: Jonathon Bellows, MD;  Location: [facility] ENDOSCOPY;  Service: Gastroenterology;  Laterality: N/A;   ganglion cyst removal      KNEE SURGERY Left 08/2009   KNEE SURGERY Right 04/07/2010   REFRACTIVE SURGERY Bilateral    ROTATOR CUFF REPAIR Right 2008   TUBAL LIGATION      Family History  Problem Relation Age of Onset   Breast cancer Maternal Aunt    Hypertension Mother    Kidney Stones Mother    Hypertension Father    Congestive Heart Failure Father    Hypertension Paternal Grandfather    Diabetes Sister     Vision loss Sister    Varicose Veins Sister    Hypertension Sister    Breast cancer Sister 9       Double Mastectomy   Hypertension Sister     Social History   Tobacco Use   Smoking status: Never   Smokeless tobacco: Never  Substance Use Topics   Alcohol use: No     Current Outpatient Medications:    carvedilol (COREG) 6.25 MG tablet, TAKE 1 TABLET BY MOUTH 2 TIMES DAILY WITH A MEAL., Disp: 180 tablet, Rfl: 1   docusate sodium (COLACE) 100 MG capsule, Take 1 capsule (100 mg total) by mouth daily as needed for mild constipation., Disp: 60 capsule, Rfl: 0   dorzolamide-timolol (COSOPT) 22.3-6.8 MG/ML ophthalmic solution, SMARTSIG:In Eye(s), Disp: , Rfl:    hydrALAZINE (APRESOLINE) 10 MG tablet, Take 1 tablet (10 mg total) by mouth 3 (three) times daily as needed. If bp goes above 150/90, Disp: 30 tablet, Rfl: 0   levocetirizine (XYZAL) 5 MG tablet, Take 5 mg by mouth daily as needed for allergies. , Disp: , Rfl:    polyethylene glycol (MIRALAX / GLYCOLAX) 17 g packet, Take 17 g by mouth daily., Disp: 14 each, Rfl: 0   TOUJEO MAX SOLOSTAR 300 UNIT/ML Solostar Pen, INJECT 24-50 UNITS INTO THE SKIN DAILY AT 12 NOON. IN PLACE OF TRESIBA, Disp: 15 mL, Rfl: 0   amLODipine (NORVASC) 5 MG tablet, Take 1 tablet (5 mg total) by mouth daily., Disp: 90 tablet, Rfl: 1   atorvastatin (LIPITOR) 80 MG tablet, TAKE 1 TABLET BY MOUTH DAILY AT 6 PM., Disp: 90 tablet, Rfl: 1   blood glucose meter kit and supplies, 1 each by Other route in the morning and at bedtime. Dispense based on patient and insurance preference. Check twice daily  ICD-10 E10.9, E11.9, Disp: 1 each, Rfl: 0   clopidogrel (PLAVIX) 75 MG tablet, Take 1 tablet (75 mg total) by mouth daily., Disp: 90 tablet, Rfl: 1   Insulin Pen Needle (BD PEN NEEDLE NANO 2ND GEN) 32G X 4 MM MISC, USE AS DIRECTED DAILY, Disp: 100 each, Rfl: 1   metFORMIN (GLUCOPHAGE-XR) 750 MG 24 hr tablet, Take 1 tablet (750 mg total) by mouth daily with breakfast., Disp:  90 tablet, Rfl: 1   olmesartan-hydrochlorothiazide (BENICAR HCT) 40-25 MG tablet, Take 1 tablet by mouth daily., Disp: 90 tablet, Rfl: 1   Semaglutide (RYBELSUS) 14 MG TABS, Take 1 tablet (14 mg total) by mouth daily., Disp: 90 tablet, Rfl: 1  Allergies  Allergen Reactions   Jardiance [Empagliflozin] Rash   Amoxicillin     rash   Bee Venom Swelling  and Other (See Comments)    Immediately takes Benadryl to counteract   Trulicity [Dulaglutide] Hives   Farxiga [Dapagliflozin] Rash    Pt reports that she developed a rash when she took [place] and she would like it to be added as an allergy   Keflex [Cephalexin] Rash    I personally reviewed active problem list, medication list, allergies, family history, social history, health maintenance with the patient/caregiver today.   ROS  Constitutional: Negative for fever or weight change.  Respiratory: Negative for cough and shortness of breath.   Cardiovascular: Negative for chest pain or palpitations.  Gastrointestinal: Negative for abdominal pain, no bowel changes.  Musculoskeletal: Negative for gait problem or joint swelling.  Skin: Negative for rash.  Neurological: Negative for dizziness or headache.  No other specific complaints in a complete review of systems (except as listed in HPI above).   Objective  Vitals:   07/04/22 1452  BP: 134/82  Pulse: 91  Resp: 16  Temp: 97.8 F (36.6 C)  TempSrc: Oral  SpO2: 99%  Weight: 199 lb 11.2 oz (90.6 kg)  Height: '5\' 4"'  (1.626 m)    Body mass index is 34.28 kg/m.  Physical Exam  Constitutional: Patient appears well-developed and well-nourished. Obese  No distress.  HEENT: head atraumatic, normocephalic, pupils equal and reactive to light, neck supple, Cardiovascular: Normal rate, regular rhythm and normal heart sounds.  No murmur heard. Trace  BLE edema. Pulmonary/Chest: Effort normal and breath sounds normal. No respiratory distress. Abdominal: Soft.  There is no  tenderness. Psychiatric: Patient has a normal mood and affect. behavior is normal. Judgment and thought content normal.   Recent Results (from the past 2160 hour(s))  POCT HgB A1C     Status: Abnormal   Collection Time: 07/04/22  2:56 PM  Result Value Ref Range   Hemoglobin A1C 6.1 (A) 4.0 - 5.6 %   HbA1c POC (<> result, manual entry)     HbA1c, POC (prediabetic range)     HbA1c, POC (controlled diabetic range)      Diabetic Foot Exam: Diabetic Foot Exam - Simple   Simple Foot Form Visual Inspection No deformities, no ulcerations, no other skin breakdown bilaterally: Yes Sensation Testing Intact to touch and monofilament testing bilaterally: Yes Pulse Check Posterior Tibialis and Dorsalis pulse intact bilaterally: Yes Comments      PHQ2/9:    07/04/2022    2:54 PM 02/28/2022    2:06 PM 11/21/2021    3:38 PM 11/06/2021    2:06 PM 07/05/2021    2:16 PM  Depression screen PHQ 2/9  Decreased Interest 0 0 0 0 0  Down, Depressed, Hopeless 0 0 0 0 0  PHQ - 2 Score 0 0 0 0 0  Altered sleeping 0   0   Tired, decreased energy 0   0   Change in appetite 0   0   Feeling bad or failure about yourself  0   0   Trouble concentrating 0   0   Moving slowly or fidgety/restless 0   0   Suicidal thoughts 0   0   PHQ-9 Score 0   0     phq 9 is negative   Fall Risk:    07/04/2022    2:54 PM 02/28/2022    2:06 PM 11/21/2021    3:41 PM 11/06/2021    2:06 PM 07/05/2021    2:16 PM  Fall Risk   Falls in the past year? 1 0 0  0 0  Number falls in past yr: 0 0 0 0 0  Injury with Fall? 0 0 0 0 0  Risk for fall due to : History of fall(s);Impaired balance/gait;Impaired vision No Fall Risks No Fall Risks No Fall Risks No Fall Risks  Follow up Falls evaluation completed;Education provided;Falls prevention discussed Falls prevention discussed Falls prevention discussed Falls prevention discussed Falls prevention discussed      Functional Status Survey: Is the patient deaf or have difficulty  hearing?: No Does the patient have difficulty seeing, even when wearing glasses/contacts?: Yes Does the patient have difficulty concentrating, remembering, or making decisions?: No Does the patient have difficulty walking or climbing stairs?: Yes Does the patient have difficulty dressing or bathing?: No Does the patient have difficulty doing errands alone such as visiting a doctor's office or shopping?: No    Assessment & Plan  1. Dyslipidemia associated with type 2 diabetes mellitus (HCC)  - POCT HgB A1C - HM Diabetes Foot Exam - blood glucose meter kit and supplies; 1 each by Other route in the morning and at bedtime. Dispense based on patient and insurance preference. Check twice daily  ICD-10 E10.9, E11.9  Dispense: 1 each; Refill: 0  Recheck labs lipid and hepatic  function test Get B12 since she takes metformin  Go down on Toujeo to 24  2. Chronic kidney disease, stage 3a ([place])   3. Morbid obesity ([place])  Discussed with the patient the risk posed by an increased BMI. Discussed importance of portion control, calorie counting and at least 150 minutes of physical activity weekly. Avoid sweet beverages and drink more water. Eat at least 6 servings of fruit and vegetables daily    4. History of CVA with residual deficit  - clopidogrel (PLAVIX) 75 MG tablet; Take 1 tablet (75 mg total) by mouth daily.  Dispense: 90 tablet; Refill: 1 - atorvastatin (LIPITOR) 80 MG tablet; TAKE 1 TABLET BY MOUTH DAILY AT 6 PM.  Dispense: 90 tablet; Refill: 1  5. Type 2 diabetes mellitus with microalbuminuria, with long-term current use of insulin (HCC)   6. Needs flu shot  - Flu Vaccine QUAD High Dose(Fluad)  7. Controlled type 2 diabetes mellitus with microalbuminuria, with long-term current use of insulin (HCC)  - Semaglutide (RYBELSUS) 14 MG TABS; Take 1 tablet (14 mg total) by mouth daily.  Dispense: 90 tablet; Refill: 1 - metFORMIN (GLUCOPHAGE-XR) 750 MG 24 hr tablet; Take 1 tablet (750  mg total) by mouth daily with breakfast.  Dispense: 90 tablet; Refill: 1  8. OSA (obstructive sleep apnea)  On CPAP - managed by Dr. Humphrey Rolls   9. Hypertensive retinopathy of both eyes  Under the care of Dr. Matilde Sprang  10. Bilateral carotid artery stenosis   11. Hypertension, benign  - olmesartan-hydrochlorothiazide (BENICAR HCT) 40-25 MG tablet; Take 1 tablet by mouth daily.  Dispense: 90 tablet; Refill: 1 - amLODipine (NORVASC) 5 MG tablet; Take 1 tablet (5 mg total) by mouth daily.  Dispense: 90 tablet; Refill: 1  12. Hypertension associated with diabetes ([place])   13. Anemia in stage 3a chronic kidney disease ([place])

## 2022-07-04 ENCOUNTER — Ambulatory Visit: Payer: Medicare PPO | Admitting: Family Medicine

## 2022-07-04 ENCOUNTER — Encounter: Payer: Self-pay | Admitting: Family Medicine

## 2022-07-04 VITALS — BP 134/82 | HR 91 | Temp 97.8°F | Resp 16 | Ht 64.0 in | Wt 199.7 lb

## 2022-07-04 DIAGNOSIS — E1129 Type 2 diabetes mellitus with other diabetic kidney complication: Secondary | ICD-10-CM | POA: Diagnosis not present

## 2022-07-04 DIAGNOSIS — H35033 Hypertensive retinopathy, bilateral: Secondary | ICD-10-CM | POA: Diagnosis not present

## 2022-07-04 DIAGNOSIS — I6523 Occlusion and stenosis of bilateral carotid arteries: Secondary | ICD-10-CM | POA: Diagnosis not present

## 2022-07-04 DIAGNOSIS — E785 Hyperlipidemia, unspecified: Secondary | ICD-10-CM

## 2022-07-04 DIAGNOSIS — N1831 Chronic kidney disease, stage 3a: Secondary | ICD-10-CM | POA: Diagnosis not present

## 2022-07-04 DIAGNOSIS — G4733 Obstructive sleep apnea (adult) (pediatric): Secondary | ICD-10-CM | POA: Diagnosis not present

## 2022-07-04 DIAGNOSIS — I6930 Unspecified sequelae of cerebral infarction: Secondary | ICD-10-CM | POA: Diagnosis not present

## 2022-07-04 DIAGNOSIS — Z23 Encounter for immunization: Secondary | ICD-10-CM | POA: Diagnosis not present

## 2022-07-04 DIAGNOSIS — Z794 Long term (current) use of insulin: Secondary | ICD-10-CM

## 2022-07-04 DIAGNOSIS — E1159 Type 2 diabetes mellitus with other circulatory complications: Secondary | ICD-10-CM

## 2022-07-04 DIAGNOSIS — E1169 Type 2 diabetes mellitus with other specified complication: Secondary | ICD-10-CM | POA: Diagnosis not present

## 2022-07-04 DIAGNOSIS — R809 Proteinuria, unspecified: Secondary | ICD-10-CM

## 2022-07-04 DIAGNOSIS — D631 Anemia in chronic kidney disease: Secondary | ICD-10-CM

## 2022-07-04 DIAGNOSIS — Z79899 Other long term (current) drug therapy: Secondary | ICD-10-CM | POA: Diagnosis not present

## 2022-07-04 DIAGNOSIS — I10 Essential (primary) hypertension: Secondary | ICD-10-CM

## 2022-07-04 DIAGNOSIS — I152 Hypertension secondary to endocrine disorders: Secondary | ICD-10-CM

## 2022-07-04 LAB — POCT GLYCOSYLATED HEMOGLOBIN (HGB A1C): Hemoglobin A1C: 6.1 % — AB (ref 4.0–5.6)

## 2022-07-04 MED ORDER — BD PEN NEEDLE NANO 2ND GEN 32G X 4 MM MISC: 1 refills | Status: DC

## 2022-07-04 MED ORDER — AMLODIPINE BESYLATE 5 MG PO TABS: 5.0000 mg | ORAL_TABLET | Freq: Every day | ORAL | 1 refills | Status: DC

## 2022-07-04 MED ORDER — METFORMIN HCL ER 750 MG PO TB24: 750.0000 mg | ORAL_TABLET | Freq: Every day | ORAL | 1 refills | Status: DC

## 2022-07-04 MED ORDER — BLOOD GLUCOSE METER KIT: 1.0000 | PACK | Freq: Two times a day (BID) | 0 refills | Status: DC

## 2022-07-04 MED ORDER — OLMESARTAN MEDOXOMIL-HCTZ 40-25 MG PO TABS: 1.0000 | ORAL_TABLET | Freq: Every day | ORAL | 1 refills | Status: DC

## 2022-07-04 MED ORDER — ATORVASTATIN CALCIUM 80 MG PO TABS: ORAL_TABLET | ORAL | 1 refills | Status: DC

## 2022-07-04 MED ORDER — CLOPIDOGREL BISULFATE 75 MG PO TABS: 75.0000 mg | ORAL_TABLET | Freq: Every day | ORAL | 1 refills | Status: DC

## 2022-07-04 MED ORDER — RYBELSUS 14 MG PO TABS: 1.0000 | ORAL_TABLET | Freq: Every day | ORAL | 1 refills | Status: DC

## 2022-07-04 NOTE — Patient Instructions (Signed)
Go down to 24 units of toujeo and if fasting sugar is below 100 on average you can go down to 22 units.

## 2022-07-05 LAB — HEPATIC FUNCTION PANEL
AG Ratio: 1.3 (calc) (ref 1.0–2.5)
ALT: 17 U/L (ref 6–29)
AST: 20 U/L (ref 10–35)
Albumin: 4.1 g/dL (ref 3.6–5.1)
Alkaline phosphatase (APISO): 121 U/L (ref 37–153)
Bilirubin, Direct: 0.1 mg/dL (ref 0.0–0.2)
Globulin: 3.2 g/dL (calc) (ref 1.9–3.7)
Indirect Bilirubin: 0.3 mg/dL (calc) (ref 0.2–1.2)
Total Bilirubin: 0.4 mg/dL (ref 0.2–1.2)
Total Protein: 7.3 g/dL (ref 6.1–8.1)

## 2022-07-05 LAB — LIPID PANEL
Cholesterol: 111 mg/dL (ref <200)
HDL: 32 mg/dL — ABNORMAL LOW (ref >=50)
LDL Cholesterol (Calc): 55 mg/dL (calc)
Non-HDL Cholesterol (Calc): 79 mg/dL (calc) (ref <130)
Total CHOL/HDL Ratio: 3.5 (calc) (ref <5.0)
Triglycerides: 162 mg/dL — ABNORMAL HIGH (ref <150)

## 2022-07-05 LAB — VITAMIN B12: Vitamin B-12: 701 pg/mL (ref 200–1100)

## 2022-07-21 ENCOUNTER — Other Ambulatory Visit: Payer: Self-pay | Admitting: Internal Medicine

## 2022-07-21 DIAGNOSIS — E1129 Type 2 diabetes mellitus with other diabetic kidney complication: Secondary | ICD-10-CM

## 2022-07-23 DIAGNOSIS — H40019 Open angle with borderline findings, low risk, unspecified eye: Secondary | ICD-10-CM | POA: Diagnosis not present

## 2022-07-23 DIAGNOSIS — E113313 Type 2 diabetes mellitus with moderate nonproliferative diabetic retinopathy with macular edema, bilateral: Secondary | ICD-10-CM | POA: Diagnosis not present

## 2022-07-23 DIAGNOSIS — H35033 Hypertensive retinopathy, bilateral: Secondary | ICD-10-CM | POA: Diagnosis not present

## 2022-07-23 DIAGNOSIS — I6930 Unspecified sequelae of cerebral infarction: Secondary | ICD-10-CM | POA: Diagnosis not present

## 2022-07-23 LAB — HM DIABETES EYE EXAM

## 2022-07-23 NOTE — Telephone Encounter (Signed)
Refilled 07/04/2022 #[id number] rf - confirmed by same pharmacy. Requested Prescriptions  Pending Prescriptions Disp Refills  . metFORMIN (GLUCOPHAGE-XR) 750 MG 24 hr tablet [Pharmacy Med Name: METFORMIN HCL ER 750 MG TABLET] 180 tablet 1    Sig: TAKE 2 TABLETS (1,500 MG TOTAL) BY MOUTH EVERY DAY WITH BREAKFAST     Endocrinology:  Diabetes - Biguanides Failed - 07/21/2022  9:44 AM      Failed - Cr in normal range and within 360 days    Creat  Date Value Ref Range Status  11/06/2021 1.18 (H) 0.50 - 1.05 mg/dL Final   Creatinine, Urine  Date Value Ref Range Status  11/06/2021 40 20 - 275 mg/dL Final         Failed - eGFR in normal range and within 360 days    GFR, Est African American  Date Value Ref Range Status  03/07/2021 45 (L) > OR = 60 mL/min/1.106m Final   GFR, Est Non African American  Date Value Ref Range Status  03/07/2021 38 (L) > OR = 60 mL/min/1.772mFinal   eGFR  Date Value Ref Range Status  11/06/2021 50 (L) > OR = 60 mL/min/1.7319minal    Comment:    The eGFR is based on the CKD-EPI 2021 equation. To calculate  the new eGFR from a previous Creatinine or Cystatin C result, go to https://www.kidney.org/professionals/ kdoqi/gfr%5Fcalculator          Passed - HBA1C is between 0 and 7.9 and within 180 days    Hemoglobin A1C  Date Value Ref Range Status  07/04/2022 6.1 (A) 4.0 - 5.6 % Final   HbA1c, POC (prediabetic range)  Date Value Ref Range Status  01/28/2019 10.5 (A) 5.7 - 6.4 % Final   HbA1c, POC (controlled diabetic range)  Date Value Ref Range Status  01/28/2019 10.5 (A) 0.0 - 7.0 % Final   HbA1c POC (<> result, manual entry)  Date Value Ref Range Status  01/28/2019 10.5 4.0 - 5.6 % Final   Hgb A1c MFr Bld  Date Value Ref Range Status  09/18/2019 6.1 (H) <5.7 % of total Hgb Final    Comment:    For someone without known diabetes, a hemoglobin  A1c value between 5.7% and 6.4% is consistent with prediabetes and should be confirmed with a   follow-up test. . For someone with known diabetes, a value <7% indicates that their diabetes is well controlled. A1c targets should be individualized based on duration of diabetes, age, comorbid conditions, and other considerations. . This assay result is consistent with an increased risk of diabetes. . Currently, no consensus exists regarding use of hemoglobin A1c for diagnosis of diabetes for children. .          Passed - B12 Level in normal range and within 720 days    Vitamin B-12  Date Value Ref Range Status  07/04/2022 701 200 - 1,100 pg/mL Final         Passed - Valid encounter within last 6 months    Recent Outpatient Visits          2 weeks ago [facility] with [facility]   4 months ago Controlled type 2 diabetes mellitus with microalbuminuria, with long-term current use of insulin [facility]   8 months ago Type 2 diabetes mellitus with microalbuminuria, with long-term current use of insulin ([facility] Steele Sizer, MD   1 year ago Type 2 diabetes mellitus with microalbuminuria, with long-term current use of insulin [facility])   [facility] Steele Sizer, MD   1 year ago Type 2 diabetes mellitus with microalbuminuria, with long-term current use of insulin [facility])   [facility] Steele Sizer, MD      Future Appointments            In 4 months  [facility], [place]   In 4 months Steele Sizer, MD [facility] [place] within normal limits and completed in the last 12 months    WBC  Date Value Ref Range Status  11/06/2021 10.2 3.8 - 10.8 Thousand/uL Final   RBC  Date Value Ref Range Status  11/06/2021 3.56 (L) 3.80 - 5.10 Million/uL Final   Hemoglobin  Date Value Ref Range Status  11/06/2021 11.0  (L) 11.7 - 15.5 g/dL Final   HCT  Date Value Ref Range Status  11/06/2021 31.8 (L) 35.0 - 45.0 % Final   MCHC  Date Value Ref Range Status  11/06/2021 34.6 32.0 - 36.0 g/dL Final   [facility]  Date Value Ref Range Status  11/06/2021 30.9 27.0 - 33.0 pg Final   MCV  Date Value Ref Range Status  11/06/2021 89.3 80.0 - 100.0 fL Final   No results found for: "PLTCOUNTKUC", "LABPLAT", "POCPLA" RDW  Date Value Ref Range Status  11/06/2021 12.3 11.0 - 15.0 % Final

## 2022-07-25 ENCOUNTER — Ambulatory Visit: Payer: Medicare PPO

## 2022-07-25 DIAGNOSIS — G4733 Obstructive sleep apnea (adult) (pediatric): Secondary | ICD-10-CM | POA: Diagnosis not present

## 2022-07-25 NOTE — Progress Notes (Unsigned)
95 percentile pressure 13.8   95th percentile leak 26.7   apnea index 1.1 /hr  apnea-hypopnea index  1.5 /hr   total days used  >4 hr 90 days  total days used <4 hr 0 days  Total compliance 100 percent  She is doing great no problems no questions at this time. Pt was seen by Claiborne Billings  RRT/RCP  from [facility]

## 2022-07-26 DIAGNOSIS — E113311 Type 2 diabetes mellitus with moderate nonproliferative diabetic retinopathy with macular edema, right eye: Secondary | ICD-10-CM | POA: Diagnosis not present

## 2022-07-26 DIAGNOSIS — E113313 Type 2 diabetes mellitus with moderate nonproliferative diabetic retinopathy with macular edema, bilateral: Secondary | ICD-10-CM | POA: Diagnosis not present

## 2022-07-26 DIAGNOSIS — H3582 Retinal ischemia: Secondary | ICD-10-CM | POA: Diagnosis not present

## 2022-07-26 DIAGNOSIS — H43811 Vitreous degeneration, right eye: Secondary | ICD-10-CM | POA: Diagnosis not present

## 2022-07-26 DIAGNOSIS — H35033 Hypertensive retinopathy, bilateral: Secondary | ICD-10-CM | POA: Diagnosis not present

## 2022-08-06 ENCOUNTER — Encounter (INDEPENDENT_AMBULATORY_CARE_PROVIDER_SITE_OTHER): Payer: Self-pay

## 2022-08-16 ENCOUNTER — Encounter: Payer: Self-pay | Admitting: Nurse Practitioner

## 2022-08-16 ENCOUNTER — Ambulatory Visit: Payer: Medicare PPO | Admitting: Nurse Practitioner

## 2022-08-16 VITALS — BP 138/76 | HR 83 | Temp 97.7°F | Resp 16 | Ht 64.0 in | Wt 201.4 lb

## 2022-08-16 DIAGNOSIS — G4733 Obstructive sleep apnea (adult) (pediatric): Secondary | ICD-10-CM

## 2022-08-16 DIAGNOSIS — Z6834 Body mass index (BMI) 34.0-34.9, adult: Secondary | ICD-10-CM

## 2022-08-16 DIAGNOSIS — Z7189 Other specified counseling: Secondary | ICD-10-CM

## 2022-08-16 DIAGNOSIS — E662 Morbid (severe) obesity with alveolar hypoventilation: Secondary | ICD-10-CM

## 2022-08-16 NOTE — Progress Notes (Signed)
[facility] [place], [place] [address]  Internal MEDICINE  Office Visit Note  Patient Name: Meagan [name]  [id number]  [id number]  Date of Service: 08/16/2022  Chief Complaint  Patient presents with   Follow-up   Hypertension   Diabetes    HPI Meagan [name] presents for a follow up visit for OSA on CPAP. --has restful sleep at night, wakes up feeling refreshed and not tired. Denies any mod-night awakenings. --no issues, doing well, 100% compliance. Epworth score 2/24   CPAP Download: 95 percentile pressure 13.8 95th percentile leak 26.7 apnea index 1.1 /hr apnea-hypopnea index  1.5 /hr total days used  >4 hr 90 days total days used <4 hr 0 days Total compliance 100 percent   She is doing great no problems no questions at this time. Pt was seen by Claiborne Billings RRT/RCP from [place]   EPWORTH SLEEPINESS SCALE: Scale: (0)= no chance of dozing; (1)= slight chance of dozing; (2)= moderate chance of dozing; (3)= high chance of dozing Chance  Situtation Sitting and reading: 0 Watching TV: 0 Sitting Inactive in public: 0 As a passenger in car: 1   Lying down to rest: 1 Sitting and talking: 0 Sitting quielty after lunch: 0 In a car, stopped in traffic: 0 TOTAL SCORE:   2 out of 24   Current Medication: Outpatient Encounter Medications as of 08/16/2022  Medication Sig Note   amLODipine (NORVASC) 5 MG tablet Take 1 tablet (5 mg total) by mouth daily.    atorvastatin (LIPITOR) 80 MG tablet TAKE 1 TABLET BY MOUTH DAILY AT 6 PM.    blood glucose meter kit and supplies 1 each by Other route in the morning and at bedtime. Dispense based on patient and insurance preference. Check twice daily  ICD-10 E10.9, E11.9    carvedilol (COREG) 6.25 MG tablet TAKE 1 TABLET BY MOUTH 2 TIMES DAILY WITH A MEAL.    clopidogrel (PLAVIX) 75 MG tablet Take 1 tablet (75 mg total) by mouth daily.    docusate sodium (COLACE) 100 MG capsule Take 1 capsule (100 mg total) by mouth daily as needed  for mild constipation.    dorzolamide-timolol (COSOPT) 22.3-6.8 MG/ML ophthalmic solution SMARTSIG:In Eye(s)    hydrALAZINE (APRESOLINE) 10 MG tablet Take 1 tablet (10 mg total) by mouth 3 (three) times daily as needed. If bp goes above 150/90 11/21/2021: Pt states she has not had to use   Insulin Pen Needle (BD PEN NEEDLE NANO 2ND GEN) 32G X 4 MM MISC USE AS DIRECTED DAILY    levocetirizine (XYZAL) 5 MG tablet Take 5 mg by mouth daily as needed for allergies.     metFORMIN (GLUCOPHAGE-XR) 750 MG 24 hr tablet Take 1 tablet (750 mg total) by mouth daily with breakfast.    olmesartan-hydrochlorothiazide (BENICAR HCT) 40-25 MG tablet Take 1 tablet by mouth daily.    polyethylene glycol (MIRALAX / GLYCOLAX) 17 g packet Take 17 g by mouth daily.    Semaglutide (RYBELSUS) 14 MG TABS Take 1 tablet (14 mg total) by mouth daily.    TOUJEO MAX SOLOSTAR 300 UNIT/ML Solostar Pen INJECT 24-50 UNITS INTO THE SKIN DAILY AT 12 NOON. IN PLACE OF TRESIBA    No facility-administered encounter medications on file as of 08/16/2022.    Surgical History: Past Surgical History:  Procedure Laterality Date   APPENDECTOMY  2000s   BIOPSY THYROID  2006   [facility]   BREAST EXCISIONAL BIOPSY Left 2000s   neg   CARPAL TUNNEL  RELEASE Left 12/2013   CATARACT EXTRACTION, BILATERAL Bilateral    Right 12/14/21 and Left 01/04/22   COLONOSCOPY  2011   DR.WOHL   COLONOSCOPY WITH PROPOFOL N/A 07/11/2017   Procedure: COLONOSCOPY WITH PROPOFOL;  Surgeon: Jonathon Bellows, MD;  Location: [facility] ENDOSCOPY;  Service: Gastroenterology;  Laterality: N/A;   ganglion cyst removal      KNEE SURGERY Left 08/2009   KNEE SURGERY Right 04/07/2010   REFRACTIVE SURGERY Bilateral    ROTATOR CUFF REPAIR Right 2008   TUBAL LIGATION      Medical History: Past Medical History:  Diagnosis Date   Cellulitis    left leg   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   Diffuse cystic mastopathy    Fall 2013   Hypertension 2004    Occlusion and stenosis of carotid artery without mention of cerebral infarction    Sleep apnea    Stroke [facility])     Family History: Family History  Problem Relation Age of Onset   Breast cancer Maternal Aunt    Hypertension Mother    Kidney Stones Mother    Hypertension Father    Congestive Heart Failure Father    Hypertension Paternal Grandfather    Diabetes Sister    Vision loss Sister    Varicose Veins Sister    Hypertension Sister    Breast cancer Sister 77       Double Mastectomy   Hypertension Sister     Social History   Socioeconomic History   Marital status: Widowed    Spouse name: Not on file   Number of children: 4   Years of education: Not on file   Highest education level: Master's degree (e.g., MA, MS, MEng, MEd, MSW, MBA)  Occupational History   Occupation: Higher education careers adviser   Tobacco Use   Smoking status: Never   Smokeless tobacco: Never  Vaping Use   Vaping Use: Never used  Substance and Sexual Activity   Alcohol use: No   Drug use: No   Sexual activity: Not Currently    Partners: Male    Comment: Widow for 14 years  Other Topics Concern   Not on file  Social History Narrative   She had a stroke on 06/2019, she has left peripheral vision loss since stroke   She moved in with her youngest sister    Social Determinants of Health   Financial Resource Strain: Low Risk  (11/21/2021)   Overall Financial Resource Strain (CARDIA)    Difficulty of Paying Living Expenses: Not hard at all  Food Insecurity: No Food Insecurity (11/21/2021)   Hunger Vital Sign    Worried About Running Out of Food in the Last Year: Never true    Ran Out of Food in the Last Year: Never true  Transportation Needs: No Transportation Needs (11/21/2021)   PRAPARE - Hydrologist (Medical): No    Lack of Transportation (Non-Medical): No  Physical Activity: Inactive (11/21/2021)   Exercise Vital Sign    Days of Exercise per Week: 0 days    Minutes of  Exercise per Session: 0 min  Stress: No Stress Concern Present (11/21/2021)   [place]    Feeling of Stress : Not at all  Social Connections: Moderately Integrated (11/21/2021)   Social Connection and Isolation Panel [NHANES]    Frequency of Communication with Friends and Family: More than three times a week  Frequency of Social Gatherings with Friends and Family: More than three times a week    Attends Religious Services: More than 4 times per year    Active Member of Clubs or Organizations: Yes    Attends Archivist Meetings: More than 4 times per year    Marital Status: Widowed  Intimate Partner Violence: Not At Risk (11/21/2021)   Humiliation, Afraid, Rape, and Kick questionnaire    Fear of Current or Ex-Partner: No    Emotionally Abused: No    Physically Abused: No    Sexually Abused: No      Review of Systems  Constitutional:  Negative for chills, fatigue and unexpected weight change.  HENT:  Negative for congestion, postnasal drip, rhinorrhea, sneezing and sore throat.   Respiratory: Negative.  Negative for cough, chest tightness, shortness of breath and wheezing.   Cardiovascular: Negative.  Negative for chest pain and palpitations.  Musculoskeletal:  Negative for arthralgias, back pain, joint swelling and neck pain.  Neurological: Negative.   Psychiatric/Behavioral:  Negative for behavioral problems (Depression), self-injury, sleep disturbance and suicidal ideas. The patient is not nervous/anxious.     Vital Signs: BP 138/76   Pulse 83   Temp 97.7 F (36.5 C)   Resp 16   Ht _0  (1.626 m)   Wt 201 lb 6.4 oz (91.4 kg)   SpO2 98%   BMI 34.57 kg/m    Physical Exam Vitals reviewed.  Constitutional:      General: She is not in acute distress.    Appearance: Normal appearance. She is obese. She is not ill-appearing.  HENT:     Head: Normocephalic and atraumatic.  Eyes:     Pupils:  Pupils are equal, round, and reactive to light.  Cardiovascular:     Rate and Rhythm: Normal rate and regular rhythm.  Pulmonary:     Effort: Pulmonary effort is normal. No respiratory distress.  Neurological:     Mental Status: She is alert and oriented to person, place, and time.     Cranial Nerves: No cranial nerve deficit.  Psychiatric:        Mood and Affect: Mood normal.        Behavior: Behavior normal.       Assessment/Plan: 1. OSA on CPAP 100% compliance with CPAP, continue to use machine as instructed. Epworth score remains 2 out of 24.    2. CPAP use counseling Not having any issues regarding routine maintenance and cleaning of CPAP machine. continue as instructed.    3. Class 1 obesity with alveolar hypoventilation, serious comorbidity, and body mass index (BMI) of 34.0 to 34.9 in adult (HCC) weight loss will improve sleep apnea. Patient gained 2 lbs since prior office visit. Discussed taking walks throughout the week and decreasing intake of high-carb foods.     General Counseling: Meagan [name] understanding of the findings of todays visit and agrees with plan of treatment. I have discussed any further diagnostic evaluation that may be needed or ordered today. We also reviewed her medications today. she has been encouraged to call the office with any questions or concerns that should arise related to todays visit.    No orders of the defined types were placed in this encounter.   No orders of the defined types were placed in this encounter.   Return in about 6 months (around 02/14/2023) for F/U CPAP with Emery Dupuy.   Total time spent:30 Minutes Time spent includes review of chart, medications, test results, and follow  up plan with the patient.   [place] Controlled Substance Database was reviewed by me.  This patient was seen by Jonetta Osgood, FNP-C in collaboration with Dr. Clayborn Bigness as a part of collaborative care agreement.   Harshini Trent R. Valetta Fuller, MSN,  FNP-C Internal medicine

## 2022-08-23 DIAGNOSIS — E113312 Type 2 diabetes mellitus with moderate nonproliferative diabetic retinopathy with macular edema, left eye: Secondary | ICD-10-CM | POA: Diagnosis not present

## 2022-09-07 ENCOUNTER — Other Ambulatory Visit: Payer: Self-pay | Admitting: Family Medicine

## 2022-09-07 DIAGNOSIS — E1169 Type 2 diabetes mellitus with other specified complication: Secondary | ICD-10-CM

## 2022-09-07 NOTE — Telephone Encounter (Signed)
Requested medications are due for refill today.  unsure  Requested medications are on the active medications list.  yes  Last refill. 06/27/22  Future visit scheduled.   yes  Notes to clinic.  Request is for a whole new kit with monitor. Please review.    Requested Prescriptions  Pending Prescriptions Disp Refills   Blood Glucose Monitoring Suppl (ACCU-CHEK GUIDE ME) w/Device KIT [Pharmacy Med Name: ACCU-CHEK GUIDE ME GLUCOSE MTR]      Sig: CHECK IN THE MORNING AND AT BEDTIME. CHECK TWICE DAILY ICD-10 E10.9, E11.9     Endocrinology: Diabetes - Testing Supplies Passed - 09/07/2022  4:58 PM      Passed - Valid encounter within last 12 months    Recent Outpatient Visits           2 months ago Dyslipidemia associated with type 2 diabetes mellitus [facility])   [facility] Steele Sizer, MD   6 months ago Controlled type 2 diabetes mellitus with microalbuminuria, with long-term current use of insulin [facility])   [facility] [place], Drue Stager, MD   10 months ago Type 2 diabetes mellitus with microalbuminuria, with long-term current use of insulin [facility])   [facility] [place], Drue Stager, MD   1 year ago Type 2 diabetes mellitus with microalbuminuria, with long-term current use of insulin [facility])   [facility] [place], Drue Stager, MD   1 year ago Type 2 diabetes mellitus with microalbuminuria, with long-term current use of insulin [facility])   [facility] Steele Sizer, MD       Future Appointments             In 2 months  [place]   In 2 months Steele Sizer, MD [facility], [facility]

## 2022-10-16 ENCOUNTER — Other Ambulatory Visit: Payer: Self-pay | Admitting: Family Medicine

## 2022-10-16 DIAGNOSIS — I152 Hypertension secondary to endocrine disorders: Secondary | ICD-10-CM

## 2022-10-16 DIAGNOSIS — E1159 Type 2 diabetes mellitus with other circulatory complications: Secondary | ICD-10-CM

## 2022-10-16 NOTE — Telephone Encounter (Signed)
Requested Prescriptions  Pending Prescriptions Disp Refills   carvedilol (COREG) 6.25 MG tablet [Pharmacy Med Name: CARVEDILOL 6.25 MG TABLET] 180 tablet 0    Sig: TAKE 1 TABLET BY MOUTH TWICE A DAY WITH FOOD     Cardiovascular: Beta Blockers 3 Failed - 10/16/2022  4:43 PM      Failed - Cr in normal range and within 360 days    Creat  Date Value Ref Range Status  11/06/2021 1.18 (H) 0.50 - 1.05 mg/dL Final   Creatinine, Urine  Date Value Ref Range Status  11/06/2021 40 20 - 275 mg/dL Final         Passed - AST in normal range and within 360 days    AST  Date Value Ref Range Status  07/04/2022 20 10 - 35 U/L Final         Passed - ALT in normal range and within 360 days    ALT  Date Value Ref Range Status  07/04/2022 17 6 - 29 U/L Final         Passed - Last BP in normal range    BP Readings from Last 1 Encounters:  08/16/22 138/76         Passed - Last Heart Rate in normal range    Pulse Readings from Last 1 Encounters:  08/16/22 83         Passed - Valid encounter within last 6 months    Recent Outpatient Visits           3 months ago Dyslipidemia associated with type 2 diabetes mellitus [facility])   [facility] [place], Drue Stager, MD   7 months ago Controlled type 2 diabetes mellitus with microalbuminuria, with long-term current use of insulin [facility])   [facility] [place], Drue Stager, MD   11 months ago Type 2 diabetes mellitus with microalbuminuria, with long-term current use of insulin [facility])   [facility] [place], Drue Stager, MD   1 year ago Type 2 diabetes mellitus with microalbuminuria, with long-term current use of insulin [facility])   [facility] [place], Drue Stager, MD   1 year ago Type 2 diabetes mellitus with microalbuminuria, with long-term current use of insulin [facility])   [facility] Steele Sizer, MD       Future Appointments             In 1 month  [facility] [place]   In 1 month Steele Sizer, MD [facility], [facility]

## 2022-10-25 DIAGNOSIS — H43813 Vitreous degeneration, bilateral: Secondary | ICD-10-CM | POA: Diagnosis not present

## 2022-10-25 DIAGNOSIS — E113313 Type 2 diabetes mellitus with moderate nonproliferative diabetic retinopathy with macular edema, bilateral: Secondary | ICD-10-CM | POA: Diagnosis not present

## 2022-10-25 DIAGNOSIS — H3582 Retinal ischemia: Secondary | ICD-10-CM | POA: Diagnosis not present

## 2022-10-25 DIAGNOSIS — H35033 Hypertensive retinopathy, bilateral: Secondary | ICD-10-CM | POA: Diagnosis not present

## 2022-11-22 ENCOUNTER — Ambulatory Visit (INDEPENDENT_AMBULATORY_CARE_PROVIDER_SITE_OTHER): Payer: Medicare PPO

## 2022-11-22 VITALS — Ht 64.0 in | Wt 201.0 lb

## 2022-11-22 DIAGNOSIS — Z0000 Encounter for general adult medical examination without abnormal findings: Secondary | ICD-10-CM

## 2022-11-22 NOTE — Progress Notes (Signed)
I connected with  Meagan [name] on 11/22/22 by a audio enabled telemedicine application and verified that I am speaking with the correct person using two identifiers.  Patient Location: Home  Provider Location: Office/Clinic  I discussed the limitations of evaluation and management by telemedicine. The patient expressed understanding and agreed to proceed.  Subjective:   Meagan [name] is a 72 y.o. female who presents for Medicare Annual (Subsequent) preventive examination.  Review of Systems    Cardiac Risk Factors include: obesity (BMI >30kg/m2);diabetes mellitus    Objective:    Today's Vitals   11/22/22 1434  Weight: 201 lb (91.2 kg)  Height: 5' 4"$  (1.626 m)   Body mass index is 34.5 kg/m.     11/22/2022    2:49 PM 11/21/2021    3:39 PM 12/02/2019    4:56 PM 11/10/2019    4:19 PM 07/02/2019    8:11 PM 02/12/2019   12:01 PM 01/10/2018    6:44 PM  Advanced Directives  Does Patient Have a Medical Advance Directive? Yes Yes No No Yes Yes Yes  Type of Industrial/product designer of [place];Living will   Healthcare Power of [place];Living will Living will  Does patient want to make changes to medical advance directive?     Yes (Inpatient - patient defers changing a medical advance directive at this time - Information given) No - Patient declined   Copy of [place] in Chart? No - copy requested No - copy requested    No - copy requested     Current Medications (verified) Outpatient Encounter Medications as of 11/22/2022  Medication Sig   amLODipine (NORVASC) 5 MG tablet Take 1 tablet (5 mg total) by mouth daily.   atorvastatin (LIPITOR) 80 MG tablet TAKE 1 TABLET BY MOUTH DAILY AT 6 PM.   Blood Glucose Monitoring Suppl (ACCU-CHEK GUIDE ME) w/Device KIT CHECK IN THE MORNING AND AT BEDTIME. CHECK TWICE DAILY ICD-10 E10.9, E11.9   carvedilol (COREG) 6.25 MG tablet TAKE 1 TABLET BY MOUTH TWICE A DAY  WITH FOOD   clopidogrel (PLAVIX) 75 MG tablet Take 1 tablet (75 mg total) by mouth daily.   docusate sodium (COLACE) 100 MG capsule Take 1 capsule (100 mg total) by mouth daily as needed for mild constipation.   dorzolamide-timolol (COSOPT) 22.3-6.8 MG/ML ophthalmic solution SMARTSIG:In Eye(s)   Insulin Pen Needle (BD PEN NEEDLE NANO 2ND GEN) 32G X 4 MM MISC USE AS DIRECTED DAILY   levocetirizine (XYZAL) 5 MG tablet Take 5 mg by mouth daily as needed for allergies.    metFORMIN (GLUCOPHAGE-XR) 750 MG 24 hr tablet Take 1 tablet (750 mg total) by mouth daily with breakfast.   olmesartan-hydrochlorothiazide (BENICAR HCT) 40-25 MG tablet Take 1 tablet by mouth daily.   polyethylene glycol (MIRALAX / GLYCOLAX) 17 g packet Take 17 g by mouth daily.   Semaglutide (RYBELSUS) 14 MG TABS Take 1 tablet (14 mg total) by mouth daily.   TOUJEO MAX SOLOSTAR 300 UNIT/ML Solostar Pen INJECT 24-50 UNITS INTO THE SKIN DAILY AT 12 NOON. IN PLACE OF [place]   hydrALAZINE (APRESOLINE) 10 MG tablet Take 1 tablet (10 mg total) by mouth 3 (three) times daily as needed. If bp goes above 150/90 (Patient not taking: Reported on 11/22/2022)   No facility-administered encounter medications on file as of 11/22/2022.    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   Diffuse cystic mastopathy    Fall 2013   Hypertension 2004   Occlusion and stenosis of carotid artery without mention of cerebral infarction    Sleep apnea    Stroke [facility])    Past Surgical History:  Procedure Laterality Date   APPENDECTOMY  2000s   BIOPSY THYROID  2006   [facility]   BREAST EXCISIONAL BIOPSY Left 2000s   neg   CARPAL TUNNEL RELEASE Left 12/2013   CATARACT EXTRACTION, BILATERAL Bilateral     Right 12/14/21 and Left 01/04/22   COLONOSCOPY  2011   DR.WOHL   COLONOSCOPY WITH PROPOFOL N/A 07/11/2017   Procedure: COLONOSCOPY WITH PROPOFOL;  Surgeon: Jonathon Bellows, MD;  Location[facility] ENDOSCOPY;  Service: Gastroenterology;  Laterality: N/A;   ganglion cyst removal      KNEE SURGERY Left 08/2009   KNEE SURGERY Right 04/07/2010   REFRACTIVE SURGERY Bilateral    ROTATOR CUFF REPAIR Right 2008   TUBAL LIGATION     Family History  Problem Relation Age of Onset   Breast cancer Maternal Aunt    Hypertension Mother    Kidney Stones Mother    Hypertension Father    Congestive Heart Failure Father    Hypertension Paternal Grandfather    Diabetes Sister    Vision loss Sister    Varicose Veins Sister    Hypertension Sister    Breast cancer Sister 44       Double Mastectomy   Hypertension Sister    Social History   Socioeconomic History   Marital status: Widowed    Spouse name: Not on file   Number of children: 4   Years of education: Not on file   Highest education level: Master's degree (e.g., MA, MS, MEng, MEd, MSW, MBA)  Occupational History   Occupation: Higher education careers adviser   Tobacco Use   Smoking status: Never   Smokeless tobacco: Never  Vaping Use   Vaping Use: Never used  Substance and Sexual Activity   Alcohol use: No   Drug use: No   Sexual activity: Not Currently    Partners: Male    Comment: Widow for 14 years  Other Topics Concern   Not on file  Social History Narrative   She had a stroke on 06/2019, she has left peripheral vision loss since stroke   She moved in with her youngest sister    Social Determinants of Health   Financial Resource Strain: Low Risk  (11/22/2022)   Overall Financial Resource Strain (CARDIA)    Difficulty of Paying Living Expenses: Not hard at all  Food Insecurity: No Food Insecurity (11/22/2022)   Hunger Vital Sign    Worried About Running Out of Food in the Last Year: Never true    [place] in the Last Year: Never true   Transportation Needs: No Transportation Needs (11/22/2022)   PRAPARE - Hydrologist (Medical): No    Lack of Transportation (Non-Medical): No  Physical Activity: Insufficiently Active (11/22/2022)   Exercise Vital Sign    Days of Exercise per Week: 3 days    Minutes of Exercise per Session: 30 min  Stress: No Stress Concern Present (11/22/2022)   [place]    Feeling of Stress : Not at all  Social Connections:  Moderately Integrated (11/22/2022)   Social Connection and Isolation Panel [NHANES]    Frequency of Communication with Friends and Family: More than three times a week    Frequency of Social Gatherings with Friends and Family: More than three times a week    Attends Religious Services: More than 4 times per year    Active Member of [employer] or Organizations: Yes    Attends Archivist Meetings: More than 4 times per year    Marital Status: Widowed    Tobacco Counseling Counseling given: Not Answered   Clinical Intake:  Pre-visit preparation completed: Yes  Pain : No/denies pain     BMI - recorded: 34.5 Nutritional Status: BMI > 30  Obese Nutritional Risks: None Diabetes: Yes CBG done?: Yes (pt says was 138 this am) Did pt. bring in CBG monitor from home?: No  How often do you need to have someone help you when you read instructions, pamphlets, or other written materials from your doctor or pharmacy?: 1 - Never  Diabetic?yes  Interpreter Needed?: No  Information entered by :: B.Elesia Pemberton,LPN   Activities of Daily Living    11/22/2022    2:50 PM 07/04/2022    2:55 PM  In your present state of health, do you have any difficulty performing the following activities:  Hearing? 0 0  Vision? 1 1  Difficulty concentrating or making decisions? 0 0  Walking or climbing stairs? 0 1  Dressing or bathing? 0 0  Doing errands, shopping? 1 0  Preparing Food and eating ? N    Using the Toilet? N   In the past six months, have you accidently leaked urine? N   Do you have problems with loss of bowel control? N   Managing your Medications? N   Managing your Finances? N   Housekeeping or managing your Housekeeping? Y   Comment sister lives with her and does     Patient Care Team: Steele Sizer, MD as PCP - General Kate Sable, MD as PCP - Cardiology (Cardiology) Desma Maxim, MD as Referring Physician (Ophthalmology) Penni Bombard, MD as Consulting Physician (Neurology)  Indicate any recent Medical Services you may have received from other than [facility] providers in the past year (date may be approximate).     Assessment:   This is a routine wellness examination for [place].  Hearing/Vision screen Hearing Screening - Comments:: Great hearing Vision Screening - Comments:: Vision blurred :seeing retina specialist: 11/29/22 eye procedure;Dr Nice eye PCP  Dietary issues and exercise activities discussed: Current Exercise Habits: The patient does not participate in regular exercise at present, Exercise limited by: Other - see comments (pt does not see well)   Goals Addressed   None    Depression Screen    11/22/2022    2:44 PM 08/16/2022    1:44 PM 07/04/2022    2:54 PM 02/28/2022    2:06 PM 11/21/2021    3:38 PM 11/06/2021    2:06 PM 07/05/2021    2:16 PM  PHQ 2/9 Scores  PHQ - 2 Score 0 0 0 0 0 0 0  PHQ- 9 Score   0   0     Fall Risk    11/22/2022    2:40 PM 08/16/2022    1:44 PM 07/04/2022    2:54 PM 02/28/2022    2:06 PM 11/21/2021    3:41 PM  Fall Risk   Falls in the past year? 0 0 1 0 0  Number falls in  past yr: 0 0 0 0 0  Injury with Fall? 0 0 0 0 0  Risk for fall due to : No Fall Risks No Fall Risks History of fall(s);Impaired balance/gait;Impaired vision No Fall Risks No Fall Risks  Follow up Education provided;Falls prevention discussed Falls evaluation completed Falls evaluation completed;Education provided;Falls prevention  discussed Falls prevention discussed Falls prevention discussed    FALL RISK PREVENTION PERTAINING TO THE HOME:  Any stairs in or around the home? Yes  If so, are there any without handrails? Yes  Home free of loose throw rugs in walkways, pet beds, electrical cords, etc? Yes  Adequate lighting in your home to reduce risk of falls? Yes   ASSISTIVE DEVICES UTILIZED TO PREVENT FALLS:  Life alert? No  Use of a cane, walker or w/c? No  has cane but does not use Grab bars in the bathroom? Yes  Shower chair or bench in shower? Yes  Elevated toilet seat or a handicapped toilet? No    Cognitive Function:        11/22/2022    2:54 PM  6CIT Screen  What Year? 0 points  What month? 0 points  What time? 0 points  Count back from 20 0 points  Months in reverse 0 points  Repeat phrase 2 points  Total Score 2 points    Immunizations Immunization History  Administered Date(s) Administered   Fluad Quad(high Dose 65+) 07/24/2019, 07/01/2020, 07/04/2022   Influenza, High Dose Seasonal PF 07/05/2021   PFIZER(Purple Top)SARS-COV-2 Vaccination 12/06/2019, 12/29/2019, 09/14/2020   Pneumococcal Conjugate-13 10/22/2018   Pneumococcal Polysaccharide-23 10/08/2006, 03/01/2020   Tdap 10/09/2007, 03/26/2018    TDAP status: Up to date  Flu Vaccine status: Up to date  Pneumococcal vaccine status: Up to date  Covid-19 vaccine status: Completed vaccines  Qualifies for Shingles Vaccine? Yes   Zostavax completed No   Shingrix Completed?: No.    Education has been provided regarding the importance of this vaccine. Patient has been advised to call insurance company to determine out of pocket expense if they have not yet received this vaccine. Advised may also receive vaccine at local pharmacy or Health Dept. Verbalized acceptance and understanding.  Screening Tests Health Maintenance  Topic Date Due   Zoster Vaccines- Shingrix (1 of 2) Never done   COVID-19 Vaccine (4 - 2023-24 season)  06/08/2022   COLONOSCOPY (Pts 45-90yr Insurance coverage will need to be confirmed)  07/11/2022   Diabetic kidney evaluation - eGFR measurement  11/06/2022   Diabetic kidney evaluation - Urine ACR  11/06/2022   HEMOGLOBIN A1C  01/02/2023   FOOT EXAM  07/05/2023   OPHTHALMOLOGY EXAM  07/24/2023   Medicare Annual Wellness (AWV)  11/23/2023   MAMMOGRAM  04/05/2024   DTaP/Tdap/Td (3 - Td or Tdap) 03/26/2028   Pneumonia Vaccine 70 Years old  Completed   INFLUENZA VACCINE  Completed   DEXA SCAN  Completed   Hepatitis C Screening  Completed   HPV VACCINES  Aged Out    Health Maintenance  Health Maintenance Due  Topic Date Due   Zoster Vaccines- Shingrix (1 of 2) Never done   COVID-19 Vaccine (4 - 2023-24 season) 06/08/2022   COLONOSCOPY (Pts 45-459yrInsurance coverage will need to be confirmed)  07/11/2022   Diabetic kidney evaluation - eGFR measurement  11/06/2022   Diabetic kidney evaluation - Urine ACR  11/06/2022    Colorectal cancer screening: Type of screening: Colonoscopy. Completed yes. Repeat every 5 years  Mammogram status: Ordered yes. Pt provided  with contact info and advised to call to schedule appt.   Bone Density status: Completed yes. Results reflect: Bone density results: NORMAL. Repeat every 5 years.  Lung Cancer Screening: (Low Dose CT Chest recommended if Age 72-80 years, 30 pack-year currently smoking OR have quit w/in 15years.) does not qualify.   Lung Cancer Screening Referral: no  Additional Screening:  Hepatitis C Screening: does not qualify; Completed no  Vision Screening: Recommended annual ophthalmology exams for early detection of glaucoma and other disorders of the eye. Is the patient up to date with their annual eye exam?  Yes  Who is the provider or what is the name of the office in which the patient attends annual eye exams? Dr Matilde Sprang If pt is not established with a provider, would they like to be referred to a provider to establish care? No .    Dental Screening: Recommended annual dental exams for proper oral hygiene  Community Resource Referral / Chronic Care Management: CRR required this visit?  No   CCM required this visit?  No      Plan:     I have personally reviewed and noted the following in the patient's chart:   Medical and social history Use of alcohol, tobacco or illicit drugs  Current medications and supplements including opioid prescriptions. Patient is not currently taking opioid prescriptions. Functional ability and status Nutritional status Physical activity Advanced directives List of other physicians Hospitalizations, surgeries, and ER visits in previous 12 months Vitals Screenings to include cognitive, depression, and falls Referrals and appointments  In addition, I have reviewed and discussed with patient certain preventive protocols, quality metrics, and best practice recommendations. A written personalized care plan for preventive services as well as general preventive health recommendations were provided to patient.     Roger Shelter, LPN   [id number]   Nurse Notes: Pt states she is doing well.Continues to have problems with her vision:she indicates she can see but not well. Pt has eye surgery on next Thursday. Pt has no concerns or questions at this time.

## 2022-11-22 NOTE — Patient Instructions (Signed)
[name] , Thank you for taking time to come for your Medicare Wellness Visit. I appreciate your ongoing commitment to your health goals. Please review the following plan we discussed and let me know if I can assist you in the future.   These are the goals we discussed:  Goals   None     This is a list of the screening recommended for you and due dates:  Health Maintenance  Topic Date Due   Zoster (Shingles) Vaccine (1 of 2) Never done   COVID-19 Vaccine (4 - 2023-24 season) 06/08/2022   Colon Cancer Screening  07/11/2022   Yearly kidney function blood test for diabetes  11/06/2022   Yearly kidney health urinalysis for diabetes  11/06/2022   Hemoglobin A1C  01/02/2023   Complete foot exam   07/05/2023   Eye exam for diabetics  07/24/2023   Medicare Annual Wellness Visit  11/23/2023   Mammogram  04/05/2024   DTaP/Tdap/Td vaccine (3 - Td or Tdap) 03/26/2028   Pneumonia Vaccine  Completed   Flu Shot  Completed   DEXA scan (bone density measurement)  Completed   Hepatitis C Screening: USPSTF Recommendation to screen - Ages 76-79 yo.  Completed   HPV Vaccine  Aged Out    Advanced directives: yed  Conditions/risks identified: low falls risk  Next appointment: Follow up in one year for your annual wellness visit 11/28/2023 @3pm$  telephone   Preventive Care 65 Years and Older, Female Preventive care refers to lifestyle choices and visits with your health care provider that can promote health and wellness. What does preventive care include? A yearly physical exam. This is also called an annual well check. Dental exams once or twice a year. Routine eye exams. Ask your health care provider how often you should have your eyes checked. Personal lifestyle choices, including: Daily care of your teeth and gums. Regular physical activity. Eating a healthy diet. Avoiding tobacco and drug use. Limiting alcohol use. Practicing safe sex. Taking low-dose aspirin every day. Taking vitamin  and mineral supplements as recommended by your health care provider. What happens during an annual well check? The services and screenings done by your health care provider during your annual well check will depend on your age, overall health, lifestyle risk factors, and family history of disease. Counseling  Your health care provider may ask you questions about your: Alcohol use. Tobacco use. Drug use. Emotional well-being. Home and relationship well-being. Sexual activity. Eating habits. History of falls. Memory and ability to understand (cognition). Work and work Statistician. Reproductive health. Screening  You may have the following tests or measurements: Height, weight, and BMI. Blood pressure. Lipid and cholesterol levels. These may be checked every 5 years, or more frequently if you are over 70 years old. Skin check. Lung cancer screening. You may have this screening every year starting at age 82 if you have a 30-pack-year history of smoking and currently smoke or have quit within the past 15 years. Fecal occult blood test (FOBT) of the stool. You may have this test every year starting at age 32. Flexible sigmoidoscopy or colonoscopy. You may have a sigmoidoscopy every 5 years or a colonoscopy every 10 years starting at age 16. Hepatitis C blood test. Hepatitis B blood test. Sexually transmitted disease (STD) testing. Diabetes screening. This is done by checking your blood sugar (glucose) after you have not eaten for a while (fasting). You may have this done every 1-3 years. Bone density scan. This is done to screen  for osteoporosis. You may have this done starting at age 22. Mammogram. This may be done every 1-2 years. Talk to your health care provider about how often you should have regular mammograms. Talk with your health care provider about your test results, treatment options, and if necessary, the need for more tests. Vaccines  Your health care provider may recommend  certain vaccines, such as: Influenza vaccine. This is recommended every year. Tetanus, diphtheria, and acellular pertussis (Tdap, Td) vaccine. You may need a Td booster every 10 years. Zoster vaccine. You may need this after age 23. Pneumococcal 13-valent conjugate (PCV13) vaccine. One dose is recommended after age 40. Pneumococcal polysaccharide (PPSV23) vaccine. One dose is recommended after age 23. Talk to your health care provider about which screenings and vaccines you need and how often you need them. This information is not intended to replace advice given to you by your health care provider. Make sure you discuss any questions you have with your health care provider. Document Released: 10/21/2015 Document Revised: 06/13/2016 Document Reviewed: 07/26/2015 Elsevier Interactive Patient Education  2017 [place] Prevention in the Home Falls can cause injuries. They can happen to people of all ages. There are many things you can do to make your home safe and to help prevent falls. What can I do on the outside of my home? Regularly fix the edges of walkways and driveways and fix any cracks. Remove anything that might make you trip as you walk through a door, such as a raised step or threshold. Trim any bushes or trees on the path to your home. Use bright outdoor lighting. Clear any walking paths of anything that might make someone trip, such as rocks or tools. Regularly check to see if handrails are loose or broken. Make sure that both sides of any steps have handrails. Any raised decks and porches should have guardrails on the edges. Have any leaves, snow, or ice cleared regularly. Use sand or salt on walking paths during winter. Clean up any spills in your garage right away. This includes oil or grease spills. What can I do in the bathroom? Use night lights. Install grab bars by the toilet and in the tub and shower. Do not use towel bars as grab bars. Use non-skid mats or  decals in the tub or shower. If you need to sit down in the shower, use a plastic, non-slip stool. Keep the floor dry. Clean up any water that spills on the floor as soon as it happens. Remove soap buildup in the tub or shower regularly. Attach bath mats securely with double-sided non-slip rug tape. Do not have throw rugs and other things on the floor that can make you trip. What can I do in the bedroom? Use night lights. Make sure that you have a light by your bed that is easy to reach. Do not use any sheets or blankets that are too big for your bed. They should not hang down onto the floor. Have a firm chair that has side arms. You can use this for support while you get dressed. Do not have throw rugs and other things on the floor that can make you trip. What can I do in the kitchen? Clean up any spills right away. Avoid walking on wet floors. Keep items that you use a lot in easy-to-reach places. If you need to reach something above you, use a strong step stool that has a grab bar. Keep electrical cords out of the way. Do  not use floor polish or wax that makes floors slippery. If you must use wax, use non-skid floor wax. Do not have throw rugs and other things on the floor that can make you trip. What can I do with my stairs? Do not leave any items on the stairs. Make sure that there are handrails on both sides of the stairs and use them. Fix handrails that are broken or loose. Make sure that handrails are as long as the stairways. Check any carpeting to make sure that it is firmly attached to the stairs. Fix any carpet that is loose or worn. Avoid having throw rugs at the top or bottom of the stairs. If you do have throw rugs, attach them to the floor with carpet tape. Make sure that you have a light switch at the top of the stairs and the bottom of the stairs. If you do not have them, ask someone to add them for you. What else can I do to help prevent falls? Wear shoes that: Do not  have high heels. Have rubber bottoms. Are comfortable and fit you well. Are closed at the toe. Do not wear sandals. If you use a stepladder: Make sure that it is fully opened. Do not climb a closed stepladder. Make sure that both sides of the stepladder are locked into place. Ask someone to hold it for you, if possible. Clearly mark and make sure that you can see: Any grab bars or handrails. First and last steps. Where the edge of each step is. Use tools that help you move around (mobility aids) if they are needed. These include: Canes. Walkers. Scooters. Crutches. Turn on the lights when you go into a dark area. Replace any light bulbs as soon as they burn out. Set up your furniture so you have a clear path. Avoid moving your furniture around. If any of your floors are uneven, fix them. If there are any pets around you, be aware of where they are. Review your medicines with your doctor. Some medicines can make you feel dizzy. This can increase your chance of falling. Ask your doctor what other things that you can do to help prevent falls. This information is not intended to replace advice given to you by your health care provider. Make sure you discuss any questions you have with your health care provider. Document Released: 07/21/2009 Document Revised: 03/01/2016 Document Reviewed: 10/29/2014 Elsevier Interactive Patient Education  2017 [employer].

## 2022-11-27 NOTE — Progress Notes (Unsigned)
Name: Meagan [name]   MRN: [id number]    DOB: [birth year]   Date:11/28/2022       Progress Note  Subjective  Chief Complaint  Follow Up  HPI  DMII: she states since she stopped Ozempic due to significant weight loss. She is currently taking Rybelsus 14 mg , Metformin just 750 mg daily and Toujeo 24 units per day A1C today is at goal at 6.5 %  She has dyslipidemia, HTN and microalbuminuria, she is on ARB, statin therapy. She is not able to tolerate SGL-2 agonist .   CKI stage III: now seeing Dr. Lanora Manis and last GFR done on 08/17 was up from 7 to 73 She is on ARB, she is unable to tolerate SGL2 agonists. She has good urine output . Urine micro positive    HTN: bp at home has been under control No headaches, dizziness, chest pain or palpitation. She is on Benicar hctz 40/25, carvedilol BID and Norvasc,  Hydralazine is prn only . 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 She has regained some vision but still has some  peripheral vision loss on both eyes but worse on the left side.  She lives with her sister and needs assistance cooking, and is still not driving  She sees a retina sub-specialist and is visually impaired   s/p cataract surgery , also has laser therapy on a regular basis . She is compliant with visits. She also has weakness on left leg - has to use a cane to assist with balance and also due to mild weakness.   OSA: seen by Dr. Humphrey Rolls and had sleep study, wearing cpap every night, tolerating it well. She has been compliant with her CPAP machine , next follow up is in April    Morbid obesity: she has a BMI above 35 and  multiple co-morbidities, she is on Rybelsus since January 2023  and has lost 16  lbs since started on therapy . She is doing well with life style modifications also. Eating more fruit, drinking more water. She has also been exercising twice a week.   Patient Active Problem List   Diagnosis Date Noted   Anemia in chronic kidney disease 12/21/2021   Chronic kidney disease, stage 3a ([place]) 12/21/2021   History of CVA with residual deficit 07/01/2019   Morbid obesity ([place]) 10/22/2018   Dyslipidemia associated with type 2 diabetes mellitus ([place]) 12/04/2016   Controlled type 2 diabetes mellitus with microalbuminuria ([place]) 12/04/2016   Seasonal allergies 02/01/2016   OSA (obstructive sleep apnea) 10/06/2015   Hypertensive retinopathy 10/06/2015   Thyroid cyst 10/06/2015   Dyslipidemia 10/06/2015   Controlled type 2 diabetes with neuropathy ([place]) 10/06/2015   History of pneumonia 10/06/2015   Chronic neck pain 10/06/2015   Carotid stenosis    Hypertension, benign     Past Surgical History:  Procedure Laterality Date   APPENDECTOMY  2000s   BIOPSY THYROID  2006   [facility]   BREAST EXCISIONAL BIOPSY Left 2000s   neg   CARPAL TUNNEL RELEASE Left 12/2013   CATARACT EXTRACTION,  BILATERAL Bilateral    Right 12/14/21 and Left 01/04/22   COLONOSCOPY  2011   DR.WOHL   COLONOSCOPY WITH PROPOFOL N/A 07/11/2017   Procedure: COLONOSCOPY WITH PROPOFOL;  Surgeon: Jonathon Bellows, MD;  Location: [facility] ENDOSCOPY;  Service: Gastroenterology;  Laterality: N/A;   ganglion cyst removal      KNEE SURGERY Left 08/2009   KNEE SURGERY Right 04/07/2010   REFRACTIVE SURGERY Bilateral    ROTATOR CUFF REPAIR Right 2008   TUBAL LIGATION      Family History  Problem Relation Age of Onset   Breast cancer Maternal Aunt    Hypertension Mother    Kidney Stones Mother    Hypertension Father    Congestive Heart Failure Father    Hypertension Paternal Grandfather    Diabetes Sister    Vision loss Sister    Varicose Veins  Sister    Hypertension Sister    Breast cancer Sister 71       Double Mastectomy   Hypertension Sister     Social History   Tobacco Use   Smoking status: Never   Smokeless tobacco: Never  Substance Use Topics   Alcohol use: No     Current Outpatient Medications:    amLODipine (NORVASC) 5 MG tablet, Take 1 tablet (5 mg total) by mouth daily., Disp: 90 tablet, Rfl: 1   atorvastatin (LIPITOR) 80 MG tablet, TAKE 1 TABLET BY MOUTH DAILY AT 6 PM., Disp: 90 tablet, Rfl: 1   Blood Glucose Monitoring Suppl (ACCU-CHEK GUIDE ME) w/Device KIT, CHECK IN THE MORNING AND AT BEDTIME. CHECK TWICE DAILY ICD-10 E10.9, E11.9, Disp: 1 kit, Rfl: 0   carvedilol (COREG) 6.25 MG tablet, TAKE 1 TABLET BY MOUTH TWICE A DAY WITH FOOD, Disp: 180 tablet, Rfl: 0   clopidogrel (PLAVIX) 75 MG tablet, Take 1 tablet (75 mg total) by mouth daily., Disp: 90 tablet, Rfl: 1   docusate sodium (COLACE) 100 MG capsule, Take 1 capsule (100 mg total) by mouth daily as needed for mild constipation., Disp: 60 capsule, Rfl: 0   dorzolamide-timolol (COSOPT) 22.3-6.8 MG/ML ophthalmic solution, SMARTSIG:In Eye(s), Disp: , Rfl:    hydrALAZINE (APRESOLINE) 10 MG tablet, Take 1 tablet (10 mg total) by mouth 3 (three) times daily as needed. If bp goes above 150/90, Disp: 30 tablet, Rfl: 0   Insulin Pen Needle (BD PEN NEEDLE NANO 2ND GEN) 32G X 4 MM MISC, USE AS DIRECTED DAILY, Disp: 100 each, Rfl: 1   levocetirizine (XYZAL) 5 MG tablet, Take 5 mg by mouth daily as needed for allergies. , Disp: , Rfl:    metFORMIN (GLUCOPHAGE-XR) 750 MG 24 hr tablet, Take 1 tablet (750 mg total) by mouth daily with breakfast., Disp: 90 tablet, Rfl: 1   olmesartan-hydrochlorothiazide (BENICAR HCT) 40-25 MG tablet, Take 1 tablet by mouth daily., Disp: 90 tablet, Rfl: 1   polyethylene glycol (MIRALAX / GLYCOLAX) 17 g packet, Take 17 g by mouth daily., Disp: 14 each, Rfl: 0   Semaglutide (RYBELSUS) 14 MG TABS, Take 1 tablet (14 mg total) by mouth daily., Disp:  90 tablet, Rfl: 1   TOUJEO MAX SOLOSTAR 300 UNIT/ML Solostar Pen, INJECT 24-50 UNITS INTO THE SKIN DAILY AT 12 NOON. IN PLACE OF TRESIBA, Disp: 15 mL, Rfl: 0  Allergies  Allergen Reactions   Jardiance [Empagliflozin] Rash   Amoxicillin     rash   Bee Venom Swelling and Other (See Comments)    Immediately takes Benadryl to counteract   Trulicity [Dulaglutide] Hives  [place] [Dapagliflozin] Rash    Pt reports that she developed a rash when she took [place] and she would like it to be added as an allergy   Keflex [Cephalexin] Rash    I personally reviewed active problem list, medication list, allergies, family history, social history, health maintenance with the patient/caregiver today.   ROS  Constitutional: Negative for fever or weight change.  Respiratory: Negative for cough and shortness of breath.   Cardiovascular: Negative for chest pain or palpitations.  Gastrointestinal: Negative for abdominal pain, no bowel changes.  Musculoskeletal: Negative for gait problem or joint swelling.  Skin: Negative for rash.  Neurological: Negative for dizziness or headache.  No other specific complaints in a complete review of systems (except as listed in HPI above).   Objective  Vitals:   11/28/22 1433  BP: 128/72  Pulse: 78  Resp: 16  Temp: 97.7 F (36.5 C)  TempSrc: Oral  SpO2: 98%  Weight: 198 lb 3.2 oz (89.9 kg)  Height: 5' 4"$  (1.626 m)    Body mass index is 34.02 kg/m.  Physical Exam  Constitutional: Patient appears well-developed and well-nourished. Obese No distress.  HEENT: head atraumatic, normocephalic, pupils equal and reactive to light, neck supple Cardiovascular: Normal rate, regular rhythm and normal heart sounds.  No murmur heard. No BLE edema. Pulmonary/Chest: Effort normal and breath sounds normal. No respiratory distress. Abdominal: Soft.  There is no tenderness. Psychiatric: Patient has a normal mood and affect. behavior is normal. Judgment and thought  content normal.  Muscular skeletal: she uses a cane for assistance - for balance  Recent Results (from the past 2160 hour(s))  POCT HgB A1C     Status: Abnormal   Collection Time: 11/28/22  2:36 PM  Result Value Ref Range   Hemoglobin A1C 6.5 (A) 4.0 - 5.6 %   HbA1c POC (<> result, manual entry)     HbA1c, POC (prediabetic range)     HbA1c, POC (controlled diabetic range)       PHQ2/9:    11/28/2022    2:34 PM 11/22/2022    2:44 PM 08/16/2022    1:44 PM 07/04/2022    2:54 PM 02/28/2022    2:06 PM  Depression screen PHQ 2/9  Decreased Interest 0 0 0 0 0  Down, Depressed, Hopeless 0 0 0 0 0  PHQ - 2 Score 0 0 0 0 0  Altered sleeping 0   0   Tired, decreased energy 0   0   Change in appetite 0   0   Feeling bad or failure about yourself  0   0   Trouble concentrating 0   0   Moving slowly or fidgety/restless 0   0   Suicidal thoughts 0   0   PHQ-9 Score 0   0     phq 9 is negative   Fall Risk:    11/28/2022    2:34 PM 11/22/2022    2:40 PM 08/16/2022    1:44 PM 07/04/2022    2:54 PM 02/28/2022    2:06 PM  Fall Risk   Falls in the past year? 0 0 0 1 0  Number falls in past yr:  0 0 0 0  Injury with Fall?  0 0 0 0  Risk for fall due to : No Fall Risks No Fall Risks No Fall Risks History of fall(s);Impaired balance/gait;Impaired vision No Fall Risks  Follow up Falls prevention discussed Education provided;Falls prevention discussed Falls evaluation completed Falls  evaluation completed;Education provided;Falls prevention discussed Falls prevention discussed      Functional Status Survey: Is the patient deaf or have difficulty hearing?: No Does the patient have difficulty seeing, even when wearing glasses/contacts?: Yes Does the patient have difficulty concentrating, remembering, or making decisions?: No Does the patient have difficulty walking or climbing stairs?: No Does the patient have difficulty dressing or bathing?: No Does the patient have difficulty doing errands  alone such as visiting a doctor's office or shopping?: Yes    Assessment & Plan  1. Dyslipidemia associated with type 2 diabetes mellitus (HCC)  - POCT HgB A1C  2. Hypertension associated with diabetes ([place])  - carvedilol (COREG) 6.25 MG tablet; Take 1 tablet (6.25 mg total) by mouth 2 (two) times daily with a meal.  Dispense: 180 tablet; Refill: 0 - insulin glargine, 2 Unit Dial, (TOUJEO MAX SOLOSTAR) 300 UNIT/ML Solostar Pen; Inject 24-50 Units into the skin daily at 12 noon. In place of Tresiba  Dispense: 15 mL; Refill: 0  3. Controlled type 2 diabetes mellitus with microalbuminuria, with long-term current use of insulin (HCC)  - metFORMIN (GLUCOPHAGE-XR) 750 MG 24 hr tablet; Take 1 tablet (750 mg total) by mouth daily with breakfast.  Dispense: 90 tablet; Refill: 1 - Semaglutide (RYBELSUS) 14 MG TABS; Take 1 tablet (14 mg total) by mouth daily.  Dispense: 90 tablet; Refill: 1 - insulin glargine, 2 Unit Dial, (TOUJEO MAX SOLOSTAR) 300 UNIT/ML Solostar Pen; Inject 24-50 Units into the skin daily at 12 noon. In place of Tresiba  Dispense: 15 mL; Refill: 0  4. Hemiparesis affecting nondominant side as late effect of cerebrovascular accident (CVA) ([place])  - atorvastatin (LIPITOR) 80 MG tablet; TAKE 1 TABLET BY MOUTH DAILY AT 6 PM.  Dispense: 90 tablet; Refill: 1 - clopidogrel (PLAVIX) 75 MG tablet; Take 1 tablet (75 mg total) by mouth daily.  Dispense: 90 tablet; Refill: 1  5. Chronic kidney disease, stage 3a ([place])  Keep follow up with neprhologist   6. Colon cancer screening  - Ambulatory referral to Gastroenterology  7. Hypertension, benign  - amLODipine (NORVASC) 5 MG tablet; Take 1 tablet (5 mg total) by mouth daily.  Dispense: 90 tablet; Refill: 1 - olmesartan-hydrochlorothiazide (BENICAR HCT) 40-25 MG tablet; Take 1 tablet by mouth daily.  Dispense: 90 tablet; Refill: 1  8. OSA (obstructive sleep apnea)   9. Carotid stenosis, bilateral

## 2022-11-28 ENCOUNTER — Encounter: Payer: Self-pay | Admitting: Family Medicine

## 2022-11-28 ENCOUNTER — Ambulatory Visit (INDEPENDENT_AMBULATORY_CARE_PROVIDER_SITE_OTHER): Payer: Medicare PPO | Admitting: Family Medicine

## 2022-11-28 ENCOUNTER — Telehealth: Payer: Self-pay | Admitting: Gastroenterology

## 2022-11-28 VITALS — BP 128/72 | HR 78 | Temp 97.7°F | Resp 16 | Ht 64.0 in | Wt 198.2 lb

## 2022-11-28 DIAGNOSIS — N1831 Chronic kidney disease, stage 3a: Secondary | ICD-10-CM

## 2022-11-28 DIAGNOSIS — G4733 Obstructive sleep apnea (adult) (pediatric): Secondary | ICD-10-CM | POA: Diagnosis not present

## 2022-11-28 DIAGNOSIS — R809 Proteinuria, unspecified: Secondary | ICD-10-CM

## 2022-11-28 DIAGNOSIS — Z794 Long term (current) use of insulin: Secondary | ICD-10-CM

## 2022-11-28 DIAGNOSIS — E1169 Type 2 diabetes mellitus with other specified complication: Secondary | ICD-10-CM | POA: Diagnosis not present

## 2022-11-28 DIAGNOSIS — Z86010 Personal history of colonic polyps: Secondary | ICD-10-CM

## 2022-11-28 DIAGNOSIS — E785 Hyperlipidemia, unspecified: Secondary | ICD-10-CM

## 2022-11-28 DIAGNOSIS — E1159 Type 2 diabetes mellitus with other circulatory complications: Secondary | ICD-10-CM | POA: Diagnosis not present

## 2022-11-28 DIAGNOSIS — I6523 Occlusion and stenosis of bilateral carotid arteries: Secondary | ICD-10-CM

## 2022-11-28 DIAGNOSIS — I69359 Hemiplegia and hemiparesis following cerebral infarction affecting unspecified side: Secondary | ICD-10-CM | POA: Diagnosis not present

## 2022-11-28 DIAGNOSIS — E1129 Type 2 diabetes mellitus with other diabetic kidney complication: Secondary | ICD-10-CM

## 2022-11-28 DIAGNOSIS — I10 Essential (primary) hypertension: Secondary | ICD-10-CM | POA: Diagnosis not present

## 2022-11-28 DIAGNOSIS — Z1211 Encounter for screening for malignant neoplasm of colon: Secondary | ICD-10-CM | POA: Diagnosis not present

## 2022-11-28 DIAGNOSIS — I152 Hypertension secondary to endocrine disorders: Secondary | ICD-10-CM

## 2022-11-28 LAB — POCT GLYCOSYLATED HEMOGLOBIN (HGB A1C): Hemoglobin A1C: 6.5 % — AB (ref 4.0–5.6)

## 2022-11-28 MED ORDER — ATORVASTATIN CALCIUM 80 MG PO TABS: ORAL_TABLET | ORAL | 1 refills | Status: DC

## 2022-11-28 MED ORDER — RYBELSUS 14 MG PO TABS: 1.0000 | ORAL_TABLET | Freq: Every day | ORAL | 1 refills | Status: DC

## 2022-11-28 MED ORDER — OLMESARTAN MEDOXOMIL-HCTZ 40-25 MG PO TABS: 1.0000 | ORAL_TABLET | Freq: Every day | ORAL | 1 refills | Status: DC

## 2022-11-28 MED ORDER — METFORMIN HCL ER 750 MG PO TB24: 750.0000 mg | ORAL_TABLET | Freq: Every day | ORAL | 1 refills | Status: DC

## 2022-11-28 MED ORDER — CARVEDILOL 6.25 MG PO TABS: 6.2500 mg | ORAL_TABLET | Freq: Two times a day (BID) | ORAL | 0 refills | Status: DC

## 2022-11-28 MED ORDER — AMLODIPINE BESYLATE 5 MG PO TABS: 5.0000 mg | ORAL_TABLET | Freq: Every day | ORAL | 1 refills | Status: DC

## 2022-11-28 MED ORDER — CLOPIDOGREL BISULFATE 75 MG PO TABS: 75.0000 mg | ORAL_TABLET | Freq: Every day | ORAL | 1 refills | Status: DC

## 2022-11-28 MED ORDER — TOUJEO MAX SOLOSTAR 300 UNIT/ML ~~LOC~~ SOPN: 24.0000 [IU] | PEN_INJECTOR | Freq: Every day | SUBCUTANEOUS | 0 refills | Status: DC

## 2022-11-28 NOTE — Telephone Encounter (Signed)
Pr missed call to schedule colonoscopy  [phone]

## 2022-11-29 ENCOUNTER — Other Ambulatory Visit: Payer: Self-pay

## 2022-11-29 DIAGNOSIS — Z86010 Personal history of colonic polyps: Secondary | ICD-10-CM

## 2022-11-29 DIAGNOSIS — E113313 Type 2 diabetes mellitus with moderate nonproliferative diabetic retinopathy with macular edema, bilateral: Secondary | ICD-10-CM | POA: Diagnosis not present

## 2022-11-29 MED ORDER — NA SULFATE-K SULFATE-MG SULF 17.5-3.13-1.6 GM/177ML PO SOLN: 1.0000 | Freq: Once | ORAL | 0 refills | Status: AC

## 2022-11-29 NOTE — Telephone Encounter (Signed)
Gastroenterology Pre-Procedure Review  Request Date: 01/04/23 Requesting Physician: Dr. Vicente Males  PATIENT REVIEW QUESTIONS: The patient responded to the following health history questions as indicated:    1. Are you having any GI issues? no 2. Do you have a personal history of Polyps? yes (last colonoscopy performed by Dr. Vicente Males 07/11/17 polyp was noted) 3. Do you have a family history of Colon Cancer or Polyps? no 4. Diabetes Mellitus? no 5. Joint replacements in the past 12 months? Cataract surgery March 2023, Eye procedure scheduled 11/29/22 with retina specialist [place] in [place] 6. Major health problems in the past 3 months?no 7. Any artificial heart valves, MVP, or defibrillator?no. Pt has seen cardiologist in the past Carotid Stenosis will send a clearance to his office    MEDICATIONS & ALLERGIES:    Patient reports the following regarding taking any anticoagulation/antiplatelet therapy:   Plavix, Coumadin, Eliquis, Xarelto, Lovenox, Pradaxa, Brilinta, or Effient? Pt takes Plavix rx written by Dr. Ancil Boozer.  Blood Thinner Request to be sent to Dr. Ancil Boozer Aspirin? no  Patient confirms/reports the following medications:  Current Outpatient Medications  Medication Sig Dispense Refill   amLODipine (NORVASC) 5 MG tablet Take 1 tablet (5 mg total) by mouth daily. 90 tablet 1   atorvastatin (LIPITOR) 80 MG tablet TAKE 1 TABLET BY MOUTH DAILY AT 6 PM. 90 tablet 1   Blood Glucose Monitoring Suppl (ACCU-CHEK GUIDE ME) w/Device KIT CHECK IN THE MORNING AND AT BEDTIME. CHECK TWICE DAILY ICD-10 E10.9, E11.9 1 kit 0   carvedilol (COREG) 6.25 MG tablet Take 1 tablet (6.25 mg total) by mouth 2 (two) times daily with a meal. 180 tablet 0   clopidogrel (PLAVIX) 75 MG tablet Take 1 tablet (75 mg total) by mouth daily. 90 tablet 1   docusate sodium (COLACE) 100 MG capsule Take 1 capsule (100 mg total) by mouth daily as needed for mild constipation. 60 capsule 0   dorzolamide-timolol (COSOPT)  22.3-6.8 MG/ML ophthalmic solution SMARTSIG:In Eye(s)     insulin glargine, 2 Unit Dial, (TOUJEO MAX SOLOSTAR) 300 UNIT/ML Solostar Pen Inject 24-50 Units into the skin daily at 12 noon. In place of Tresiba 15 mL 0   Insulin Pen Needle (BD PEN NEEDLE NANO 2ND GEN) 32G X 4 MM MISC USE AS DIRECTED DAILY 100 each 1   levocetirizine (XYZAL) 5 MG tablet Take 5 mg by mouth daily as needed for allergies.      metFORMIN (GLUCOPHAGE-XR) 750 MG 24 hr tablet Take 1 tablet (750 mg total) by mouth daily with breakfast. 90 tablet 1   olmesartan-hydrochlorothiazide (BENICAR HCT) 40-25 MG tablet Take 1 tablet by mouth daily. 90 tablet 1   polyethylene glycol (MIRALAX / GLYCOLAX) 17 g packet Take 17 g by mouth daily. 14 each 0   Semaglutide (RYBELSUS) 14 MG TABS Take 1 tablet (14 mg total) by mouth daily. 90 tablet 1   No current facility-administered medications for this visit.    Patient confirms/reports the following allergies:  Allergies  Allergen Reactions   Jardiance [Empagliflozin] Rash   Amoxicillin     rash   Bee Venom Swelling and Other (See Comments)    Immediately takes Benadryl to counteract   Trulicity [Dulaglutide] Hives   Farxiga [Dapagliflozin] Rash    Pt reports that she developed a rash when she took [place] and she would like it to be added as an allergy   Keflex [Cephalexin] Rash    No orders of the defined types were placed in this encounter.  AUTHORIZATION INFORMATION Primary Insurance: 1D#: Group #:  Secondary Insurance: 1D#: Group #:  SCHEDULE INFORMATION: Date: 01/04/23 Time: Location: [facility]

## 2022-12-11 ENCOUNTER — Telehealth: Payer: Self-pay | Admitting: Family Medicine

## 2022-12-11 ENCOUNTER — Telehealth: Payer: Self-pay

## 2022-12-11 NOTE — Telephone Encounter (Signed)
Returned call and Patent attorney for [facility].

## 2022-12-11 NOTE — Telephone Encounter (Signed)
Copied from [place] [phone]. Topic: General - Other >> Dec 11, 2022  9:20 AM Everette C wrote: Reason for CRM: Sharyn Lull with [facility] has called to follow up on requests for cessation of a blood thinner sent on 11/29/22 through Epic   The patient's colonoscopy will be 01/04/23  Please contact Sharyn Lull further when possible

## 2022-12-11 NOTE — Telephone Encounter (Signed)
Phone call made to Dr. Ancil Boozer office at [facility] to check on status of patients blood thinner request sent over on 11/29/22.  Message has been routed to Dr. Ancil Boozer.  Informed call rep that this is not urgent just wanted to follow up on it as patients colonoscopy is not until 01/04/23.  Thanks, [place], [place]

## 2022-12-27 NOTE — Telephone Encounter (Addendum)
Patient stated she spoke with Sharyn Lull today at [facility] stated she has not received any information regarding her blood thinners. Patients colonoscopy is scheduled for January 04, 2023  Patient would like a call regarding her blood thinner instruction as well.   Please advise   [place] at [place]

## 2022-12-27 NOTE — Telephone Encounter (Signed)
Blood thinner advice received from Dr. Ancil Boozer office.  Pt has been advised per Dr. Ancil Boozer to stop Plavix 2 days prior to colonoscopy and restart 1 day after procedure.  Pt verbalized understanding.  Thanks,  [place], [place]

## 2023-01-01 DIAGNOSIS — G4733 Obstructive sleep apnea (adult) (pediatric): Secondary | ICD-10-CM | POA: Diagnosis not present

## 2023-01-03 ENCOUNTER — Encounter: Payer: Self-pay | Admitting: Gastroenterology

## 2023-01-03 ENCOUNTER — Telehealth: Payer: Self-pay

## 2023-01-03 NOTE — Telephone Encounter (Signed)
Patient lvm yesterday wanting to confirm when she was supposed to stop her blood thinner.  Patient call returned.  Advised that she should have stopped blood thinner on 01/02/23.  She said she did stop on yesterday.  Thanks, [employer]

## 2023-01-04 ENCOUNTER — Ambulatory Visit: Payer: Medicare PPO | Admitting: Anesthesiology

## 2023-01-04 ENCOUNTER — Encounter
Admission: RE | Disposition: A | Discharge status: Home / Self Care | Payer: Self-pay | Source: Home / Self Care | Attending: Gastroenterology

## 2023-01-04 ENCOUNTER — Encounter: Payer: Self-pay | Admitting: Gastroenterology

## 2023-01-04 ENCOUNTER — Ambulatory Visit
Admission: RE | Admit: 2023-01-04 | Discharge: 2023-01-04 | Disposition: A | Discharge status: Home / Self Care | Payer: Medicare PPO | Attending: Gastroenterology | Admitting: Gastroenterology

## 2023-01-04 DIAGNOSIS — N1831 Chronic kidney disease, stage 3a: Secondary | ICD-10-CM | POA: Diagnosis not present

## 2023-01-04 DIAGNOSIS — Z86010 Personal history of colonic polyps: Secondary | ICD-10-CM

## 2023-01-04 DIAGNOSIS — I129 Hypertensive chronic kidney disease with stage 1 through stage 4 chronic kidney disease, or unspecified chronic kidney disease: Secondary | ICD-10-CM | POA: Diagnosis not present

## 2023-01-04 DIAGNOSIS — D122 Benign neoplasm of ascending colon: Secondary | ICD-10-CM | POA: Insufficient documentation

## 2023-01-04 DIAGNOSIS — G4730 Sleep apnea, unspecified: Secondary | ICD-10-CM | POA: Diagnosis not present

## 2023-01-04 DIAGNOSIS — Z794 Long term (current) use of insulin: Secondary | ICD-10-CM | POA: Diagnosis not present

## 2023-01-04 DIAGNOSIS — K649 Unspecified hemorrhoids: Secondary | ICD-10-CM | POA: Diagnosis not present

## 2023-01-04 DIAGNOSIS — I69398 Other sequelae of cerebral infarction: Secondary | ICD-10-CM | POA: Diagnosis not present

## 2023-01-04 DIAGNOSIS — H538 Other visual disturbances: Secondary | ICD-10-CM | POA: Diagnosis not present

## 2023-01-04 DIAGNOSIS — E1151 Type 2 diabetes mellitus with diabetic peripheral angiopathy without gangrene: Secondary | ICD-10-CM | POA: Diagnosis not present

## 2023-01-04 DIAGNOSIS — D759 Disease of blood and blood-forming organs, unspecified: Secondary | ICD-10-CM | POA: Insufficient documentation

## 2023-01-04 DIAGNOSIS — I10 Essential (primary) hypertension: Secondary | ICD-10-CM | POA: Insufficient documentation

## 2023-01-04 DIAGNOSIS — K5730 Diverticulosis of large intestine without perforation or abscess without bleeding: Secondary | ICD-10-CM | POA: Diagnosis not present

## 2023-01-04 DIAGNOSIS — D649 Anemia, unspecified: Secondary | ICD-10-CM | POA: Insufficient documentation

## 2023-01-04 DIAGNOSIS — Z7984 Long term (current) use of oral hypoglycemic drugs: Secondary | ICD-10-CM | POA: Diagnosis not present

## 2023-01-04 DIAGNOSIS — K635 Polyp of colon: Secondary | ICD-10-CM

## 2023-01-04 DIAGNOSIS — D126 Benign neoplasm of colon, unspecified: Secondary | ICD-10-CM

## 2023-01-04 DIAGNOSIS — D631 Anemia in chronic kidney disease: Secondary | ICD-10-CM | POA: Diagnosis not present

## 2023-01-04 DIAGNOSIS — Z1211 Encounter for screening for malignant neoplasm of colon: Secondary | ICD-10-CM | POA: Diagnosis not present

## 2023-01-04 DIAGNOSIS — E1122 Type 2 diabetes mellitus with diabetic chronic kidney disease: Secondary | ICD-10-CM | POA: Diagnosis not present

## 2023-01-04 HISTORY — PX: COLONOSCOPY WITH PROPOFOL: SHX5780

## 2023-01-04 LAB — GLUCOSE, CAPILLARY: Glucose-Capillary: 114 mg/dL — ABNORMAL HIGH (ref 70–99)

## 2023-01-04 SURGERY — COLONOSCOPY WITH PROPOFOL
Anesthesia: General

## 2023-01-04 MED ORDER — SODIUM CHLORIDE 0.9 % IV SOLN
INTRAVENOUS | Status: DC
  Administered 2023-01-04: 20 mL/h via INTRAVENOUS

## 2023-01-04 MED ORDER — PROPOFOL 500 MG/50ML IV EMUL
INTRAVENOUS | Status: DC | PRN
  Administered 2023-01-04: 150 ug/kg/min via INTRAVENOUS

## 2023-01-04 MED ORDER — PROPOFOL 1000 MG/100ML IV EMUL
INTRAVENOUS | Status: AC
  Filled 2023-01-04: qty 100

## 2023-01-04 MED ORDER — EPHEDRINE SULFATE (PRESSORS) 50 MG/ML IJ SOLN
INTRAMUSCULAR | Status: DC | PRN
  Administered 2023-01-04: 10 mg via INTRAVENOUS

## 2023-01-04 NOTE — Transfer of Care (Signed)
Immediate Anesthesia Transfer of Care Note  Patient: Meagan [name]  Procedure(s) Performed: COLONOSCOPY WITH PROPOFOL  Patient Location: PACU  Anesthesia Type:General  Level of Consciousness: awake and sedated  Airway & Oxygen Therapy: Patient Spontanous Breathing and Patient connected to face mask oxygen  Post-op Assessment: Report given to RN and Post -op Vital signs reviewed and stable  Post vital signs: Reviewed and stable  Last Vitals:  Vitals Value Taken Time  BP 98/52 01/04/23 0856  Temp 35.6 C 01/04/23 0856  Pulse 63 01/04/23 0856  Resp 16 01/04/23 0856  SpO2 100 % 01/04/23 0856    Last Pain:  Vitals:   01/04/23 0856  TempSrc: Temporal  PainSc: Asleep         Complications: There were no known notable events for this encounter.

## 2023-01-04 NOTE — Op Note (Signed)
[facility] Gastroenterology Patient Name: Meagan [name] Procedure Date: 01/04/2023 7:25 AM MRN: [id number] Account #: [id number] Date of Birth: Jan 07, 1951 Admit Type: Outpatient Age: 72 Room: [facility] ENDO ROOM 1 Gender: Female Note Status: Finalized Instrument Name: Jasper Riling [id number],Colonoscope [id number] Procedure:             Colonoscopy Indications:           Surveillance: Personal history of adenomatous polyps                         on last colonoscopy > 5 years ago Providers:             Jonathon Bellows MD, MD Referring MD:          Bethena Roys. Sowles, MD (Referring MD) Medicines:             Monitored Anesthesia Care Complications:         No immediate complications. Procedure:             Pre-Anesthesia Assessment:                        - Prior to the procedure, a History and Physical was                         performed, and patient medications, allergies and                         sensitivities were reviewed. The patient's tolerance                         of previous anesthesia was reviewed.                        - The risks and benefits of the procedure and the                         sedation options and risks were discussed with the                         patient. All questions were answered and informed                         consent was obtained.                        - ASA Grade Assessment: II - A patient with mild                         systemic disease.                        After obtaining informed consent, the colonoscope was                         passed under direct vision. Throughout the procedure,                         the patient's blood pressure, pulse, and oxygen  saturations were monitored continuously. The                         Colonoscope was introduced through the anus and                         advanced to the the cecum, identified by the                         appendiceal orifice. The Colonoscope was  introduced                         through the and advanced to the. The colonoscopy was                         performed with ease. The patient tolerated the                         procedure well. The quality of the bowel preparation                         was excellent. The ileocecal valve, appendiceal                         orifice, and rectum were photographed. Findings:      The perianal and digital rectal examinations were normal.      A 5 mm polyp was found in the ascending colon. The polyp was sessile.       The polyp was removed with a cold snare. Resection and retrieval were       complete.      A few small-mouthed diverticula were found in the ascending colon.      The exam was otherwise without abnormality on direct and retroflexion       views. Impression:            - One 5 mm polyp in the ascending colon, removed with                         a cold snare. Resected and retrieved.                        - Diverticulosis in the ascending colon.                        - The examination was otherwise normal on direct and                         retroflexion views. Recommendation:        - Discharge patient to home (with escort).                        - Resume previous diet.                        - Continue present medications.                        - Await pathology results.                        -  Repeat colonoscopy for surveillance based on                         pathology results. Procedure Code(s):     --- Professional ---                        [id number], Colonoscopy, flexible; with removal of                         tumor(s), polyp(s), or other lesion(s) by snare                         technique Diagnosis Code(s):     --- Professional ---                        Z86.010, Personal history of colonic polyps                        D12.2, Benign neoplasm of ascending colon                        K57.30, Diverticulosis of large intestine without                          perforation or abscess without bleeding CPT copyright 2022 American Medical Association. All rights reserved. The codes documented in this report are preliminary and upon coder review may  be revised to meet current compliance requirements. Jonathon Bellows, MD Jonathon Bellows MD, MD 01/04/2023 8:53:21 AM This report has been signed electronically. Number of Addenda: 0 Note Initiated On: 01/04/2023 7:25 AM Scope Withdrawal Time: 0 hours 10 minutes 11 seconds  Total Procedure Duration: 0 hours 16 minutes 10 seconds  Estimated Blood Loss:  Estimated blood loss: none.      [facility]

## 2023-01-04 NOTE — Anesthesia Preprocedure Evaluation (Signed)
Anesthesia Evaluation  Patient identified by MRN, date of birth, ID band Patient awake    Reviewed: Allergy & Precautions, NPO status , Patient's Chart, lab work & pertinent test results  History of Anesthesia Complications Negative for: history of anesthetic complications  Airway Mallampati: III  TM Distance: >3 FB Neck ROM: full    Dental no notable dental hx.    Pulmonary sleep apnea and Continuous Positive Airway Pressure Ventilation    Pulmonary exam normal        Cardiovascular hypertension, On Medications and On Home Beta Blockers + Peripheral Vascular Disease  Normal cardiovascular exam     Neuro/Psych CVA, Residual Symptoms  negative psych ROS   GI/Hepatic negative GI ROS, Neg liver ROS,,,  Endo/Other  negative endocrine ROSdiabetes, Well Controlled    Renal/GU Renal disease  negative genitourinary   Musculoskeletal   Abdominal   Peds  Hematology  (+) Blood dyscrasia, anemia   Anesthesia Other Findings Past Medical History: No date: Cellulitis     Comment:  left leg No date: Diabetes mellitus without complication ([place])     Comment:  non-insulin dependent No date: Diffuse cystic mastopathy 2013: Fall 2004: Hypertension No date: Occlusion and stenosis of carotid artery without mention of  cerebral infarction No date: Sleep apnea No date: Stroke [facility])  Past Surgical History: 2000s: APPENDECTOMY 2006: BIOPSY THYROID     Comment:  [facility] 2000s: BREAST EXCISIONAL BIOPSY; Left     Comment:  neg 12/2013: CARPAL TUNNEL RELEASE; Left No date: CATARACT EXTRACTION, BILATERAL; Bilateral     Comment:  Right 12/14/21 and Left 01/04/22 2011: COLONOSCOPY     Comment:  DR.WOHL 07/11/2017: COLONOSCOPY WITH PROPOFOL; N/A     Comment:  Procedure: COLONOSCOPY WITH PROPOFOL;  Surgeon: Jonathon Bellows, MD;  Location: [facility] ENDOSCOPY;  Service:               Gastroenterology;  Laterality: N/A; No date: EYE  SURGERY No date: ganglion cyst removal  08/2009: KNEE SURGERY; Left 04/07/2010: KNEE SURGERY; Right No date: REFRACTIVE SURGERY; Bilateral 2008: ROTATOR CUFF REPAIR; Right No date: TUBAL LIGATION  BMI    Body Mass Index: 33.78 kg/m      Reproductive/Obstetrics negative OB ROS                             Anesthesia Physical Anesthesia Plan  ASA: 3  Anesthesia Plan: General   Post-op Pain Management:    Induction: Intravenous  PONV Risk Score and Plan: Propofol infusion and TIVA  Airway Management Planned: Natural Airway and Nasal Cannula  Additional Equipment:   Intra-op Plan:   Post-operative Plan:   Informed Consent: I have reviewed the patients History and Physical, chart, labs and discussed the procedure including the risks, benefits and alternatives for the proposed anesthesia with the patient or authorized representative who has indicated his/her understanding and acceptance.     Dental Advisory Given  Plan Discussed with: Anesthesiologist, CRNA and Surgeon  Anesthesia Plan Comments: (Patient consented for risks of anesthesia including but not limited to:  - adverse reactions to medications - risk of airway placement if required - damage to eyes, teeth, lips or other oral mucosa - nerve damage due to positioning  - sore throat or hoarseness - Damage to heart, brain, nerves, lungs, other parts of body or loss of life  Patient voiced understanding.)  Anesthesia Quick Evaluation  

## 2023-01-04 NOTE — Anesthesia Postprocedure Evaluation (Signed)
Anesthesia Post Note  Patient: Meagan [name]  Procedure(s) Performed: COLONOSCOPY WITH PROPOFOL  Patient location during evaluation: Endoscopy Anesthesia Type: General Level of consciousness: awake and alert Pain management: pain level controlled Vital Signs Assessment: post-procedure vital signs reviewed and stable Respiratory status: spontaneous breathing, nonlabored ventilation, respiratory function stable and patient connected to nasal cannula oxygen Cardiovascular status: blood pressure returned to baseline and stable Postop Assessment: no apparent nausea or vomiting Anesthetic complications: no   There were no known notable events for this encounter.   Last Vitals:  Vitals:   01/04/23 0906 01/04/23 0916  BP: 108/78 (!) 123/58  Pulse: 68 70  Resp:  16  Temp:    SpO2: 98% 99%    Last Pain:  Vitals:   01/04/23 0916  TempSrc:   PainSc: 0-No pain                 Ilene Qua

## 2023-01-04 NOTE — H&P (Signed)
Jonathon Bellows, MD [address] [place], [place], [place], [address] [address], [place], [place], [place], [address] Phone: [phone]  Fax: [phone]  Primary Care Physician:  Steele Sizer, MD   Pre-Procedure History & Physical: HPI:  Meagan [name] is a 72 y.o. female is here for an colonoscopy.   Past Medical History:  Diagnosis Date   Cellulitis    left leg   Diabetes mellitus without complication ([place])    non-insulin dependent   Diffuse cystic mastopathy    Fall 2013   Hypertension 2004   Occlusion and stenosis of carotid artery without mention of cerebral infarction    Sleep apnea    Stroke [facility])     Past Surgical History:  Procedure Laterality Date   APPENDECTOMY  2000s   BIOPSY THYROID  2006   [facility]   BREAST EXCISIONAL BIOPSY Left 2000s   neg   CARPAL TUNNEL RELEASE Left 12/2013   CATARACT EXTRACTION, BILATERAL Bilateral    Right 12/14/21 and Left 01/04/22   COLONOSCOPY  2011   DR.WOHL   COLONOSCOPY WITH PROPOFOL N/A 07/11/2017   Procedure: COLONOSCOPY WITH PROPOFOL;  Surgeon: Jonathon Bellows, MD;  Location: [facility] ENDOSCOPY;  Service: Gastroenterology;  Laterality: N/A;   EYE SURGERY     ganglion cyst removal      KNEE SURGERY Left 08/2009   KNEE SURGERY Right 04/07/2010   REFRACTIVE SURGERY Bilateral    ROTATOR CUFF REPAIR Right 2008   TUBAL LIGATION      Prior to Admission medications   Medication Sig Start Date End Date Taking? Authorizing Provider  amLODipine (NORVASC) 5 MG tablet Take 1 tablet (5 mg total) by mouth daily. 11/28/22  Yes Sowles, Drue Stager, MD  atorvastatin (LIPITOR) 80 MG tablet TAKE 1 TABLET BY MOUTH DAILY AT 6 PM. 11/28/22  Yes Sowles, Drue Stager, MD  carvedilol (COREG) 6.25 MG tablet Take 1 tablet (6.25 mg total) by mouth 2 (two) times daily with a meal. 11/28/22  Yes Sowles, Drue Stager, MD  clopidogrel (PLAVIX) 75 MG tablet Take 1 tablet (75 mg total) by mouth daily. 11/28/22  Yes Sowles, Drue Stager, MD  docusate sodium (COLACE) 100 MG capsule  Take 1 capsule (100 mg total) by mouth daily as needed for mild constipation. 07/01/20  Yes Sowles, Drue Stager, MD  dorzolamide-timolol (COSOPT) 22.3-6.8 MG/ML ophthalmic solution SMARTSIG:In Eye(s) 11/17/21  Yes [provider]  insulin glargine, 2 Unit Dial, (TOUJEO MAX SOLOSTAR) 300 UNIT/ML Solostar Pen Inject 24-50 Units into the skin daily at 12 noon. In place of Tresiba 11/28/22  Yes Sowles, Drue Stager, MD  Insulin Pen Needle (BD PEN NEEDLE NANO 2ND GEN) 32G X 4 MM MISC USE AS DIRECTED DAILY 07/04/22  Yes Sowles, Drue Stager, MD  levocetirizine (XYZAL) 5 MG tablet Take 5 mg by mouth daily as needed for allergies.    Yes [provider]  metFORMIN (GLUCOPHAGE-XR) 750 MG 24 hr tablet Take 1 tablet (750 mg total) by mouth daily with breakfast. 11/28/22  Yes Sowles, Drue Stager, MD  olmesartan-hydrochlorothiazide (BENICAR HCT) 40-25 MG tablet Take 1 tablet by mouth daily. 11/28/22  Yes Sowles, Drue Stager, MD  polyethylene glycol (MIRALAX / GLYCOLAX) 17 g packet Take 17 g by mouth daily. 07/08/19  Yes Mariel Aloe, MD  Semaglutide (RYBELSUS) 14 MG TABS Take 1 tablet (14 mg total) by mouth daily. 11/28/22  Yes Sowles, Drue Stager, MD  Blood Glucose Monitoring Suppl (ACCU-CHEK GUIDE ME) w/Device KIT CHECK IN THE MORNING AND AT BEDTIME. CHECK TWICE DAILY ICD-10 E10.9, E11.9 09/10/22  Steele Sizer, MD    Allergies as of 11/29/2022 - Review Complete 11/28/2022  Allergen Reaction Noted   Jardiance [empagliflozin] Rash 11/21/2021   Amoxicillin  02/13/2021   Bee venom Swelling and Other (See Comments) [id number]   Trulicity [dulaglutide] Hives 04/17/2022   Farxiga [dapagliflozin] Rash 07/03/2019   Keflex [cephalexin] Rash 11/16/2012    Family History  Problem Relation Age of Onset   Breast cancer Maternal Aunt    Hypertension Mother    Kidney Stones Mother    Hypertension Father    Congestive Heart Failure Father    Hypertension Paternal Grandfather    Diabetes Sister    Vision loss Sister     Varicose Veins Sister    Hypertension Sister    Breast cancer Sister 59       Double Mastectomy   Hypertension Sister     Social History   Socioeconomic History   Marital status: Widowed    Spouse name: Not on file   Number of children: 4   Years of education: Not on file   Highest education level: Master's degree (e.g., MA, MS, MEng, MEd, MSW, MBA)  Occupational History   Occupation: Higher education careers adviser   Tobacco Use   Smoking status: Never   Smokeless tobacco: Never  Vaping Use   Vaping Use: Never used  Substance and Sexual Activity   Alcohol use: No   Drug use: No   Sexual activity: Not Currently    Partners: Male    Comment: Widow for 14 years  Other Topics Concern   Not on file  Social History Narrative   She had a stroke on 06/2019, she has left peripheral vision loss since stroke   She moved in with her youngest sister    Social Determinants of Health   Financial Resource Strain: Low Risk  (11/22/2022)   Overall Financial Resource Strain (CARDIA)    Difficulty of Paying Living Expenses: Not hard at all  Food Insecurity: No Food Insecurity (11/22/2022)   Hunger Vital Sign    Worried About Running Out of Food in the Last Year: Never true    [place] in the Last Year: Never true  Transportation Needs: No Transportation Needs (11/22/2022)   PRAPARE - Hydrologist (Medical): No    Lack of Transportation (Non-Medical): No  Physical Activity: Insufficiently Active (11/22/2022)   Exercise Vital Sign    Days of Exercise per Week: 3 days    Minutes of Exercise per Session: 30 min  Stress: No Stress Concern Present (11/22/2022)   [place]    Feeling of Stress : Not at all  Social Connections: Moderately Integrated (11/22/2022)   Social Connection and Isolation Panel [NHANES]    Frequency of Communication with Friends and Family: More than three times a week    Frequency of  Social Gatherings with Friends and Family: More than three times a week    Attends Religious Services: More than 4 times per year    Active Member of [employer] or Organizations: Yes    Attends Archivist Meetings: More than 4 times per year    Marital Status: Widowed  Intimate Partner Violence: Not At Risk (11/22/2022)   Humiliation, Afraid, Rape, and Kick questionnaire    Fear of Current or Ex-Partner: No    Emotionally Abused: No    Physically Abused: No    Sexually Abused: No    Review of Systems:  See HPI, otherwise negative ROS  Physical Exam: BP 135/78   Pulse 68   Temp (!) 97.2 F (36.2 C) (Temporal)   Resp 20   Ht 5\' 4"  (1.626 m)   Wt 89.3 kg   SpO2 100%   BMI 33.78 kg/m  General:   Alert,  pleasant and cooperative in NAD Head:  Normocephalic and atraumatic. Neck:  Supple; no masses or thyromegaly. Lungs:  Clear throughout to auscultation, normal respiratory effort.    Heart:  +S1, +S2, Regular rate and rhythm, No edema. Abdomen:  Soft, nontender and nondistended. Normal bowel sounds, without guarding, and without rebound.   Neurologic:  Alert and  oriented x4;  grossly normal neurologically.  Impression/Plan: Meagan [name] is here for an colonoscopy to be performed for surveillance due to prior history of colon polyps   Risks, benefits, limitations, and alternatives regarding  colonoscopy have been reviewed with the patient.  Questions have been answered.  All parties agreeable.   Jonathon Bellows, MD  01/04/2023, 8:01 AM

## 2023-01-07 ENCOUNTER — Encounter: Payer: Self-pay | Admitting: Gastroenterology

## 2023-01-07 LAB — SURGICAL PATHOLOGY

## 2023-01-16 ENCOUNTER — Telehealth: Payer: Self-pay | Admitting: Internal Medicine

## 2023-01-16 NOTE — Telephone Encounter (Signed)
Left vm and sent mychart message to confirm 01/23/23 appointment-Toni

## 2023-01-18 ENCOUNTER — Encounter: Payer: Self-pay | Admitting: Gastroenterology

## 2023-01-23 ENCOUNTER — Ambulatory Visit: Payer: Medicare PPO

## 2023-01-30 ENCOUNTER — Other Ambulatory Visit: Payer: Self-pay | Admitting: Family Medicine

## 2023-01-30 DIAGNOSIS — E1169 Type 2 diabetes mellitus with other specified complication: Secondary | ICD-10-CM

## 2023-02-01 DIAGNOSIS — H43813 Vitreous degeneration, bilateral: Secondary | ICD-10-CM | POA: Diagnosis not present

## 2023-02-01 DIAGNOSIS — H3582 Retinal ischemia: Secondary | ICD-10-CM | POA: Diagnosis not present

## 2023-02-01 DIAGNOSIS — H35033 Hypertensive retinopathy, bilateral: Secondary | ICD-10-CM | POA: Diagnosis not present

## 2023-02-01 DIAGNOSIS — E113313 Type 2 diabetes mellitus with moderate nonproliferative diabetic retinopathy with macular edema, bilateral: Secondary | ICD-10-CM | POA: Diagnosis not present

## 2023-02-01 LAB — HM DIABETES EYE EXAM

## 2023-02-04 ENCOUNTER — Other Ambulatory Visit: Payer: Self-pay | Admitting: Family Medicine

## 2023-02-04 DIAGNOSIS — E1169 Type 2 diabetes mellitus with other specified complication: Secondary | ICD-10-CM

## 2023-02-05 ENCOUNTER — Other Ambulatory Visit: Payer: Self-pay | Admitting: Family Medicine

## 2023-02-05 DIAGNOSIS — E1169 Type 2 diabetes mellitus with other specified complication: Secondary | ICD-10-CM

## 2023-02-05 MED ORDER — ONETOUCH ULTRA VI STRP: ORAL_STRIP | 1 refills | Status: DC

## 2023-02-05 NOTE — Telephone Encounter (Signed)
Pt called requesting to have her One Touch test strips submitted, the transmission to pharmacy failed.

## 2023-02-05 NOTE — Telephone Encounter (Signed)
Will resubmit refill due to on 01/31/23 transmission to pharmacy failed.

## 2023-02-05 NOTE — Telephone Encounter (Signed)
Requested Prescriptions  Pending Prescriptions Disp Refills   glucose blood (ONETOUCH ULTRA) test strip 200 strip 1    Sig: USE UP TO 4 TIMES DAILY AS DIRECTED     Endocrinology: Diabetes - Testing Supplies Passed - 02/05/2023 10:32 AM      Passed - Valid encounter within last 12 months    Recent Outpatient Visits           2 months ago Dyslipidemia associated with type 2 diabetes mellitus [facility])   [place] [facility] [place], Danna Hefty, MD   7 months ago Dyslipidemia associated with type 2 diabetes mellitus [facility])   [place] [facility] Alba Cory, MD   11 months ago Controlled type 2 diabetes mellitus with microalbuminuria, with long-term current use of insulin [facility])   [place] [facility] [place], Danna Hefty, MD   1 year ago Type 2 diabetes mellitus with microalbuminuria, with long-term current use of insulin [facility])   [place] [facility] [place], Danna Hefty, MD   1 year ago Type 2 diabetes mellitus with microalbuminuria, with long-term current use of insulin [facility])   [place] [facility] Alba Cory, MD       Future Appointments             In 1 month Carlynn Purl, Danna Hefty, MD [facility]

## 2023-02-14 ENCOUNTER — Ambulatory Visit: Payer: Medicare PPO | Admitting: Nurse Practitioner

## 2023-02-15 ENCOUNTER — Telehealth: Payer: Self-pay | Admitting: Family Medicine

## 2023-02-15 ENCOUNTER — Ambulatory Visit: Payer: Medicare PPO | Admitting: Nurse Practitioner

## 2023-02-15 ENCOUNTER — Encounter: Payer: Self-pay | Admitting: Nurse Practitioner

## 2023-02-15 VITALS — BP 150/82 | HR 80 | Temp 96.4°F | Resp 16 | Ht 64.0 in | Wt 201.6 lb

## 2023-02-15 DIAGNOSIS — Z7189 Other specified counseling: Secondary | ICD-10-CM | POA: Diagnosis not present

## 2023-02-15 DIAGNOSIS — G4733 Obstructive sleep apnea (adult) (pediatric): Secondary | ICD-10-CM

## 2023-02-15 DIAGNOSIS — Z6834 Body mass index (BMI) 34.0-34.9, adult: Secondary | ICD-10-CM | POA: Diagnosis not present

## 2023-02-15 DIAGNOSIS — E662 Morbid (severe) obesity with alveolar hypoventilation: Secondary | ICD-10-CM | POA: Diagnosis not present

## 2023-02-15 NOTE — Telephone Encounter (Signed)
Pt does not have an accucheck she has a One Touch Ultra    Medication Refill - Medication: One Touch Ultra Test strips   Has the patient contacted their pharmacy? Yes.   (Agent: If no, request that the patient contact the pharmacy for the refill. If patient does not wish to contact the pharmacy document the reason why and proceed with request.) (Agent: If yes, when and what did the pharmacy advise?)  Preferred Pharmacy (with phone number or street name):  [facility] [address] [place] - [address]  [address] [place] [address]  Phone: [phone] Fax: [phone]   Has the patient been seen for an appointment in the last year OR does the patient have an upcoming appointment? Yes.    Agent: Please be advised that RX refills may take up to 3 business days. We ask that you follow-up with your pharmacy.

## 2023-02-15 NOTE — Progress Notes (Unsigned)
[facility] [address] [place], [place] [address]  Internal MEDICINE  Office Visit Note  Patient Name: Meagan [name]  [id number]  [id number]  Date of Service: 02/16/2023  Chief Complaint  Patient presents with   Diabetes   Hypertension   Follow-up    HPI Maryana presents for a follow-up visit for OSA on CPAP.  --has restful sleep at night, wakes up feeling refreshed and not tired. Denies any mod-night awakenings. --no issues, doing well Patient prefers to see Dr. Freda Munro and wants to schedule next visit with him which is encouraged.  -cancelled visit with Tresa Endo RRT with [facility] because she feels like she is told the same thing at both visits.  I do not have the CPAP download since she did not meet with [facility] RRT from [facility].  Kelly RRT was contacted and will provide the CPAP download at her earliest convenience. We will contact the patient by Monday with the result of her CPAP download.       Current Medication: Outpatient Encounter Medications as of 02/15/2023  Medication Sig   amLODipine (NORVASC) 5 MG tablet Take 1 tablet (5 mg total) by mouth daily.   atorvastatin (LIPITOR) 80 MG tablet TAKE 1 TABLET BY MOUTH DAILY AT 6 PM.   Blood Glucose Monitoring Suppl (ACCU-CHEK GUIDE ME) w/Device KIT CHECK IN THE MORNING AND AT BEDTIME. CHECK TWICE DAILY ICD-10 E10.9, E11.9   carvedilol (COREG) 6.25 MG tablet Take 1 tablet (6.25 mg total) by mouth 2 (two) times daily with a meal.   clopidogrel (PLAVIX) 75 MG tablet Take 1 tablet (75 mg total) by mouth daily.   docusate sodium (COLACE) 100 MG capsule Take 1 capsule (100 mg total) by mouth daily as needed for mild constipation.   dorzolamide-timolol (COSOPT) 22.3-6.8 MG/ML ophthalmic solution SMARTSIG:In Eye(s)   glucose blood (ACCU-CHEK AVIVA PLUS) test strip 1 each by Other route as needed for other.   insulin glargine, 2 Unit Dial, (TOUJEO MAX SOLOSTAR) 300 UNIT/ML Solostar Pen Inject 24-50 Units into the  skin daily at 12 noon. In place of Tresiba   Insulin Pen Needle (BD PEN NEEDLE NANO 2ND GEN) 32G X 4 MM MISC USE AS DIRECTED   levocetirizine (XYZAL) 5 MG tablet Take 5 mg by mouth daily as needed for allergies.    metFORMIN (GLUCOPHAGE-XR) 750 MG 24 hr tablet Take 1 tablet (750 mg total) by mouth daily with breakfast.   olmesartan-hydrochlorothiazide (BENICAR HCT) 40-25 MG tablet Take 1 tablet by mouth daily.   polyethylene glycol (MIRALAX / GLYCOLAX) 17 g packet Take 17 g by mouth daily.   Semaglutide (RYBELSUS) 14 MG TABS Take 1 tablet (14 mg total) by mouth daily.   No facility-administered encounter medications on file as of 02/15/2023.    Surgical History: Past Surgical History:  Procedure Laterality Date   APPENDECTOMY  2000s   BIOPSY THYROID  2006   [facility]   BREAST EXCISIONAL BIOPSY Left 2000s   neg   CARPAL TUNNEL RELEASE Left 12/2013   CATARACT EXTRACTION, BILATERAL Bilateral    Right 12/14/21 and Left 01/04/22   COLONOSCOPY  2011   DR.WOHL   COLONOSCOPY WITH PROPOFOL N/A 07/11/2017   Procedure: COLONOSCOPY WITH PROPOFOL;  Surgeon: Wyline Mood, MD;  [facility] END[facility]COPY;  Service: Gastroenterology;  Laterality: N/A;   COLONOSCOPY WITH PROPOFOL N/A 01/04/2023   Procedure: COLONOSCOPY WITH PROPOFOL;  Surgeon: Wyline Mood, MD;  Location: [facility] ENDOSCOPY;  Service: Gastroenterology;  Laterality: N/A;   EYE SURGERY  ganglion cyst removal      KNEE SURGERY Left 08/2009   KNEE SURGERY Right 04/07/2010   REFRACTIVE SURGERY Bilateral    ROTATOR CUFF REPAIR Right 2008   TUBAL LIGATION      Medical History: Past Medical History:  Diagnosis Date   Cellulitis    left leg   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   Diffuse cystic mastopathy    Fall 2013   Hypertension 2004   Occlusion and stenosis of carotid artery without mention of cerebral infarction    Sleep apnea    Stroke [facility])     Family History: Family History  Problem Relation Age  of Onset   Breast cancer Maternal Aunt    Hypertension Mother    Kidney Stones Mother    Hypertension Father    Congestive Heart Failure Father    Hypertension Paternal Grandfather    Diabetes Sister    Vision loss Sister    Varicose Veins Sister    Hypertension Sister    Breast cancer Sister 5       Double Mastectomy   Hypertension Sister     Social History   Socioeconomic History   Marital status: Widowed    Spouse name: Not on file   Number of children: 4   Years of education: Not on file   Highest education level: Master's degree (e.g., MA, MS, MEng, MEd, MSW, MBA)  Occupational History   Occupation: Primary school teacher   Tobacco Use   Smoking status: Never   Smokeless tobacco: Never  Vaping Use   Vaping Use: Never used  Substance and Sexual Activity   Alcohol use: No   Drug use: No   Sexual activity: Not Currently    Partners: Male    Comment: Widow for 14 years  Other Topics Concern   Not on file  Social History Narrative   She had a stroke on 06/2019, she has left peripheral vision loss since stroke   She moved in with her youngest sister    Social Determinants of Health   Financial Resource Strain: Low Risk  (11/22/2022)   Overall Financial Resource Strain (CARDIA)    Difficulty of Paying Living Expenses: Not hard at all  Food Insecurity: No Food Insecurity (11/22/2022)   Hunger Vital Sign    Worried About Running Out of Food in the Last Year: Never true    Ran Out of Food in the Last Year: Never true  Transportation Needs: No Transportation Needs (11/22/2022)   PRAPARE - Administrator, Civil Service (Medical): No    Lack of Transportation (Non-Medical): No  Physical Activity: Insufficiently Active (11/22/2022)   Exercise Vital Sign    Days of Exercise per Week: 3 days    Minutes of Exercise per Session: 30 min  Stress: No Stress Concern Present (11/22/2022)   [employer] of Occupational Health - Occupational Stress Questionnaire    Feeling  of Stress : Not at all  Social Connections: Moderately Integrated (11/22/2022)   Social Connection and Isolation Panel [NHANES]    Frequency of Communication with Friends and Family: More than three times a week    Frequency of Social Gatherings with Friends and Family: More than three times a week    Attends Religious Services: More than 4 times per year    Active Member of [employer] or Organizations: Yes    Attends Banker Meetings: More than 4 times per year    Marital  Status: Widowed  Intimate Partner Violence: Not At Risk (11/22/2022)   Humiliation, Afraid, Rape, and Kick questionnaire    Fear of Current or Ex-Partner: No    Emotionally Abused: No    Physically Abused: No    Sexually Abused: No      Review of Systems  Constitutional:  Negative for chills, fatigue and unexpected weight change.  HENT:  Negative for congestion, postnasal drip, rhinorrhea, sneezing and sore throat.   Respiratory: Negative.  Negative for cough, chest tightness, shortness of breath and wheezing.   Cardiovascular: Negative.  Negative for chest pain and palpitations.  Musculoskeletal:  Negative for arthralgias, back pain, joint swelling and neck pain.  Neurological: Negative.   Psychiatric/Behavioral:  Negative for behavioral problems (Depression), self-injury, sleep disturbance and suicidal ideas. The patient is not nervous/anxious.     Vital Signs: BP (!) 150/82   Pulse 80   Temp (!) 96.4 F (35.8 C)   Resp 16   Ht 5\' 4"  (1.626 m)   Wt 201 lb 9.6 oz (91.4 kg)   SpO2 96%   BMI 34.60 kg/m    Physical Exam Vitals reviewed.  Constitutional:      General: She is not in acute distress.    Appearance: Normal appearance. She is obese. She is not ill-appearing.  HENT:     Head: Normocephalic and atraumatic.  Eyes:     Pupils: Pupils are equal, round, and reactive to light.  Cardiovascular:     Rate and Rhythm: Normal rate and regular rhythm.  Pulmonary:     Effort: Pulmonary effort  is normal. No respiratory distress.  Neurological:     Mental Status: She is alert and oriented to person, place, and time.     Cranial Nerves: No cranial nerve deficit.  Psychiatric:        Mood and Affect: Mood normal.        Behavior: Behavior normal.        Assessment/Plan: 1. OSA on CPAP Doing well, will provide patient with CPAP download information by this afternoon or Monday.    2. CPAP use counseling Not having any issues regarding routine maintenance and cleaning of CPAP machine. continue as instructed.    3. Class 1 obesity with alveolar hypoventilation, serious comorbidity, and body mass index (BMI) of 34.0 to 34.9 in adult (HCC) weight loss will improve sleep apnea. Patient gained 5 more lbs since prior office visit. Continue diet and lifestyle modifications as discussed at previous office visits.   General Counseling: esmee frybarger understanding of the findings of todays visit and agrees with plan of treatment. I have discussed any further diagnostic evaluation that may be needed or ordered today. We also reviewed her medications today. she has been encouraged to call the office with any questions or concerns that should arise related to todays visit.    No orders of the defined types were placed in this encounter.   No orders of the defined types were placed in this encounter.   Return for F/U, pulmonary/sleep, WITH DSK ONLY.   Total time spent:30 Minutes Time spent includes review of chart, medications, test results, and follow up plan with the patient.   [place] Controlled Substance Database was reviewed by me.  This patient was seen by Sallyanne Kuster, FNP-C in collaboration with Dr. Beverely Risen as a part of collaborative care agreement.   Mackson Botz R. Tedd Sias, MSN, FNP-C Internal medicine

## 2023-02-16 ENCOUNTER — Encounter: Payer: Self-pay | Admitting: Nurse Practitioner

## 2023-02-18 ENCOUNTER — Other Ambulatory Visit: Payer: Self-pay

## 2023-02-18 MED ORDER — ONETOUCH ULTRA TEST VI STRP: ORAL_STRIP | 12 refills | Status: DC

## 2023-02-18 NOTE — Telephone Encounter (Signed)
Order sent for One Touch Ultra test strips per patient request.

## 2023-03-08 ENCOUNTER — Other Ambulatory Visit: Payer: Self-pay | Admitting: Family Medicine

## 2023-03-08 DIAGNOSIS — E113312 Type 2 diabetes mellitus with moderate nonproliferative diabetic retinopathy with macular edema, left eye: Secondary | ICD-10-CM | POA: Diagnosis not present

## 2023-03-08 DIAGNOSIS — E1159 Type 2 diabetes mellitus with other circulatory complications: Secondary | ICD-10-CM

## 2023-03-08 LAB — HM DIABETES EYE EXAM

## 2023-03-11 ENCOUNTER — Other Ambulatory Visit: Payer: Self-pay | Admitting: Family Medicine

## 2023-03-11 DIAGNOSIS — Z1231 Encounter for screening mammogram for malignant neoplasm of breast: Secondary | ICD-10-CM

## 2023-03-27 ENCOUNTER — Ambulatory Visit: Payer: Medicare PPO | Admitting: Family Medicine

## 2023-03-29 ENCOUNTER — Ambulatory Visit: Payer: Medicare PPO | Admitting: Family Medicine

## 2023-04-04 ENCOUNTER — Other Ambulatory Visit: Payer: Self-pay | Admitting: Family Medicine

## 2023-04-04 DIAGNOSIS — I152 Hypertension secondary to endocrine disorders: Secondary | ICD-10-CM

## 2023-04-04 NOTE — Progress Notes (Signed)
Name: Meagan [name]   MRN: [id number]    DOB: 04-07-1951   Date:04/05/2023       Progress Note  Subjective  Chief Complaint  Follow Up  HPI  DMII: she states since she stopped Ozempic due to significant weight loss. She is currently taking Rybelsus 14 mg , Metformin ER  750 mg daily and Toujeo 24 units today A1C is down from 6.5 % to 6 %. She is tired of giving herself shots. We will try going up on Metformin to 1500 mg daily and titrate insulin down with start point at 20 units- advised to go down by 2 units every 3 days if glucose still between 100-130 fasting She has dyslipidemia, HTN and microalbuminuria, she is on ARB, statin therapy. She is not able to tolerate SGL-2 agonist .   CKI stage III: now seeing Dr. Suezanne Jacquet and last GFR done on 08/17 was up from 49 to 56 She is on ARB, she is unable to tolerate SGL2 agonists. She has good urine output . Urine micro positive    HTN: bp at home has been under control No headaches, dizziness, chest pain or palpitation. She is on Benicar hctz 40/25, carvedilol BID and Norvasc,  Hydralazine is prn only . BP is towards low end of normal and she has not taken bp medication yet today. BP at home is also low 100's . 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 She has regained some vision but still has some  peripheral vision loss on both eyes but worse on the left side.  She lives with her sister and needs assistance cooking, and is still not  driving  She sees a retina sub-specialist Dr. Marella Chimes at [facility],  s/p cataract surgery , also has laser therapy on a regular basis . She is compliant with visits. She also has weakness on left leg - has to use a cane to assist with balance .   OSA: seen by Dr. Welton Flakes and had sleep study, wearing cpap every night, tolerating it well. She has been compliant with her CPAP machine. Visits are up to date, last visit was May 2024    Obesity: BMI is below 35 now, continue life style modification and DM, HTN control.   Patient Active Problem List   Diagnosis Date Noted   History of colonic polyps 01/04/2023   Adenomatous polyp of colon 01/04/2023   Anemia in chronic kidney disease 12/21/2021   Chronic kidney disease, stage 3a (HCC) 12/21/2021   History of CVA with residual deficit 07/01/2019   Morbid obesity (HCC) 10/22/2018   Dyslipidemia associated with type 2 diabetes mellitus (HCC) 12/04/2016   Controlled type 2 diabetes mellitus with microalbuminuria (HCC) 12/04/2016   Seasonal allergies 02/01/2016   OSA (obstructive sleep apnea) 10/06/2015   Hypertensive retinopathy 10/06/2015   Thyroid cyst 10/06/2015   Dyslipidemia 10/06/2015  Controlled type 2 diabetes with neuropathy (HCC) 10/06/2015   History of pneumonia 10/06/2015   Chronic neck pain 10/06/2015   Carotid stenosis    Hypertension, benign     Past Surgical History:  Procedure Laterality Date   APPENDECTOMY  2000s   BIOPSY THYROID  2006   [facility]   BREAST EXCISIONAL BIOPSY Left 2000s   neg   CARPAL TUNNEL RELEASE Left 12/2013   CATARACT EXTRACTION, BILATERAL Bilateral    Right 12/14/21 and Left 01/04/22   COLONOSCOPY  2011   DR[facility]   COLONOSCOPY WITH PROPOFOL N/A 07/11/2017   Procedure: COLONOSCOPY WITH PROPOFOL;  Surgeon: Wyline Mood, MD;  Location: [facility] ENDOSCOPY;  Service: Gastroenterology;  Laterality: N/A;   COLONOSCOPY WITH PROPOFOL N/A 01/04/2023   Procedure: COLONOSCOPY WITH PROPOFOL;  Surgeon: Wyline Mood, MD;   Location: [facility] ENDOSCOPY;  Service: Gastroenterology;  Laterality: N/A;   EYE SURGERY     ganglion cyst removal      KNEE SURGERY Left 08/2009   KNEE SURGERY Right 04/07/2010   REFRACTIVE SURGERY Bilateral    ROTATOR CUFF REPAIR Right 2008   TUBAL LIGATION      Family History  Problem Relation Age of Onset   Breast cancer Maternal Aunt    Hypertension Mother    Kidney Stones Mother    Hypertension Father    Congestive Heart Failure Father    Hypertension Paternal Grandfather    Diabetes Sister    Vision loss Sister    Varicose Veins Sister    Hypertension Sister    Breast cancer Sister 33       Double Mastectomy   Hypertension Sister     Social History   Tobacco Use   Smoking status: Never   Smokeless tobacco: Never  Substance Use Topics   Alcohol use: No     Current Outpatient Medications:    amLODipine (NORVASC) 5 MG tablet, Take 1 tablet (5 mg total) by mouth daily., Disp: 90 tablet, Rfl: 1   atorvastatin (LIPITOR) 80 MG tablet, TAKE 1 TABLET BY MOUTH DAILY AT 6 PM., Disp: 90 tablet, Rfl: 1   Blood Glucose Monitoring Suppl (ACCU-CHEK GUIDE ME) w/Device KIT, CHECK IN THE MORNING AND AT BEDTIME. CHECK TWICE DAILY ICD-10 E10.9, E11.9, Disp: 1 kit, Rfl: 0   carvedilol (COREG) 6.25 MG tablet, TAKE 1 TABLET BY MOUTH 2 TIMES DAILY WITH A MEAL., Disp: 60 tablet, Rfl: 0   clopidogrel (PLAVIX) 75 MG tablet, Take 1 tablet (75 mg total) by mouth daily., Disp: 90 tablet, Rfl: 1   docusate sodium (COLACE) 100 MG capsule, Take 1 capsule (100 mg total) by mouth daily as needed for mild constipation., Disp: 60 capsule, Rfl: 0   dorzolamide-timolol (COSOPT) 22.3-6.8 MG/ML ophthalmic solution, SMARTSIG:In Eye(s), Disp: , Rfl:    glucose blood (ONETOUCH ULTRA TEST) test strip, Use as instructed, Disp: 100 each, Rfl: 12   insulin glargine, 2 Unit Dial, (TOUJEO MAX SOLOSTAR) 300 UNIT/ML Solostar Pen, Inject 24-50 Units into the skin daily at 12 noon. In place of Tresiba, Disp: 15 mL, Rfl:  0   Insulin Pen Needle (BD PEN NEEDLE NANO 2ND GEN) 32G X 4 MM MISC, USE AS DIRECTED, Disp: 100 each, Rfl: 1   levocetirizine (XYZAL) 5 MG tablet, Take 5 mg by mouth daily as needed for allergies. , Disp: , Rfl:    metFORMIN (GLUCOPHAGE-XR) 750 MG 24 hr tablet, Take 1 tablet (750 mg total) by mouth daily with breakfast., Disp: 90 tablet, Rfl: 1   olmesartan-hydrochlorothiazide (  BENICAR HCT) 40-25 MG tablet, Take 1 tablet by mouth daily., Disp: 90 tablet, Rfl: 1   polyethylene glycol (MIRALAX / GLYCOLAX) 17 g packet, Take 17 g by mouth daily., Disp: 14 each, Rfl: 0   Semaglutide (RYBELSUS) 14 MG TABS, Take 1 tablet (14 mg total) by mouth daily., Disp: 90 tablet, Rfl: 1  Allergies  Allergen Reactions   Jardiance [Empagliflozin] Rash   Amoxicillin     rash   Bee Venom Swelling and Other (See Comments)    Immediately takes Benadryl to counteract   Trulicity [Dulaglutide] Hives   Farxiga [Dapagliflozin] Rash    Pt reports that she developed a rash when she took [place] and she would like it to be added as an allergy   Keflex [Cephalexin] Rash    I personally reviewed active problem list, medication list, allergies, family history, social history, health maintenance with the patient/caregiver today.   ROS  Ten systems reviewed and is negative except as mentioned in HPI   Objective  Vitals:   04/05/23 0902  BP: 118/68  Pulse: 85  Resp: 16  SpO2: 98%  Weight: 199 lb (90.3 kg)  Height: 5\' 4"  (1.626 m)    Body mass index is 34.16 kg/m.  Physical Exam  Constitutional: Patient appears well-developed and well-nourished. Obese  No distress.  HEENT: head atraumatic, normocephalic, pupils equal and reactive to light, neck supple Cardiovascular: Normal rate, regular rhythm and normal heart sounds.  No murmur heard. No BLE edema. Pulmonary/Chest: Effort normal and breath sounds normal. No respiratory distress. Abdominal: Soft.  There is no tenderness. Psychiatric: Patient has a normal  mood and affect. behavior is normal. Judgment and thought content normal.   Recent Results (from the past 2160 hour(s))  HM DIABETES EYE EXAM     Status: Abnormal   Collection Time: 02/01/23 12:00 AM  Result Value Ref Range   HM Diabetic Eye Exam Retinopathy (A) No Retinopathy    Comment: [place] [facility]  POCT HgB A1C     Status: Abnormal   Collection Time: 04/05/23  9:03 AM  Result Value Ref Range   Hemoglobin A1C 6.0 (A) 4.0 - 5.6 %   HbA1c POC (<> result, manual entry)     HbA1c, POC (prediabetic range)     HbA1c, POC (controlled diabetic range)      PHQ2/9:    04/05/2023    9:02 AM 11/28/2022    2:34 PM 11/22/2022    2:44 PM 08/16/2022    1:44 PM 07/04/2022    2:54 PM  Depression screen PHQ 2/9  Decreased Interest 0 0 0 0 0  Down, Depressed, Hopeless 0 0 0 0 0  PHQ - 2 Score 0 0 0 0 0  Altered sleeping 0 0   0  Tired, decreased energy 0 0   0  Change in appetite 0 0   0  Feeling bad or failure about yourself  0 0   0  Trouble concentrating 0 0   0  Moving slowly or fidgety/restless 0 0   0  Suicidal thoughts 0 0   0  PHQ-9 Score 0 0   0    phq 9 is negative   Fall Risk:    04/05/2023    9:02 AM 11/28/2022    2:34 PM 11/22/2022    2:40 PM 08/16/2022    1:44 PM 07/04/2022    2:54 PM  Fall Risk   Falls in the past year? 0 0 0 0 1  Number falls in past yr: 0  0 0 0  Injury with Fall? 0  0 0 0  Risk for fall due to : No Fall Risks No Fall Risks No Fall Risks No Fall Risks History of fall(s);Impaired balance/gait;Impaired vision  Follow up Falls prevention discussed Falls prevention discussed Education provided;Falls prevention discussed Falls evaluation completed Falls evaluation completed;Education provided;Falls prevention discussed      Functional Status Survey: Is the patient deaf or have difficulty hearing?: No Does the patient have difficulty seeing, even when wearing glasses/contacts?: Yes Does the patient have difficulty concentrating,  remembering, or making decisions?: No Does the patient have difficulty walking or climbing stairs?: Yes Does the patient have difficulty dressing or bathing?: No Does the patient have difficulty doing errands alone such as visiting a doctor's office or shopping?: Yes    Assessment & Plan  1. Dyslipidemia associated with type 2 diabetes mellitus (HCC)  - POCT HgB A1C - COMPLETE METABOLIC PANEL WITH GFR - Urine Microalbumin w/creat. ratio - Lipid Profile  2. Chronic kidney disease, stage 3a (HCC)  - VITAMIN D 25 Hydroxy (Vit-D Deficiency, Fractures)  3. Hemiparesis affecting nondominant side as late effect of cerebrovascular accident (CVA) (HCC)  Stable  4. Obesity (BMI 30.0-34.9)  Discussed with the patient the risk posed by an increased BMI. Discussed importance of portion control, calorie counting and at least 150 minutes of physical activity weekly. Avoid sweet beverages and drink more water. Eat at least 6 servings of fruit and vegetables daily    5. OSA (obstructive sleep apnea)  Compliant   6. Hypertension associated with diabetes (HCC)  - carvedilol (COREG) 3.125 MG tablet; Take 1 tablet (3.125 mg total) by mouth 2 (two) times daily with a meal.  Dispense: 180 tablet; Refill: 1 - CBC with Differential/Platelet  7. Controlled type 2 diabetes mellitus with microalbuminuria, with long-term current use of insulin (HCC)  - metFORMIN (GLUCOPHAGE-XR) 750 MG 24 hr tablet; Take 2 tablets (1,500 mg total) by mouth daily with breakfast. Double what you have at home  Dispense: 180 tablet; Refill: 0

## 2023-04-05 ENCOUNTER — Ambulatory Visit: Payer: Medicare PPO | Admitting: Family Medicine

## 2023-04-05 ENCOUNTER — Encounter: Payer: Self-pay | Admitting: Family Medicine

## 2023-04-05 VITALS — BP 118/68 | HR 85 | Resp 16 | Ht 64.0 in | Wt 199.0 lb

## 2023-04-05 DIAGNOSIS — E1159 Type 2 diabetes mellitus with other circulatory complications: Secondary | ICD-10-CM

## 2023-04-05 DIAGNOSIS — E669 Obesity, unspecified: Secondary | ICD-10-CM | POA: Diagnosis not present

## 2023-04-05 DIAGNOSIS — G4733 Obstructive sleep apnea (adult) (pediatric): Secondary | ICD-10-CM | POA: Diagnosis not present

## 2023-04-05 DIAGNOSIS — N1831 Chronic kidney disease, stage 3a: Secondary | ICD-10-CM

## 2023-04-05 DIAGNOSIS — E1169 Type 2 diabetes mellitus with other specified complication: Secondary | ICD-10-CM | POA: Diagnosis not present

## 2023-04-05 DIAGNOSIS — I69359 Hemiplegia and hemiparesis following cerebral infarction affecting unspecified side: Secondary | ICD-10-CM

## 2023-04-05 DIAGNOSIS — R809 Proteinuria, unspecified: Secondary | ICD-10-CM

## 2023-04-05 DIAGNOSIS — E785 Hyperlipidemia, unspecified: Secondary | ICD-10-CM

## 2023-04-05 DIAGNOSIS — E1129 Type 2 diabetes mellitus with other diabetic kidney complication: Secondary | ICD-10-CM | POA: Diagnosis not present

## 2023-04-05 DIAGNOSIS — I152 Hypertension secondary to endocrine disorders: Secondary | ICD-10-CM | POA: Diagnosis not present

## 2023-04-05 DIAGNOSIS — Z794 Long term (current) use of insulin: Secondary | ICD-10-CM

## 2023-04-05 LAB — CBC WITH DIFFERENTIAL/PLATELET
Eosinophils Relative: 2 %
Hemoglobin: 10.9 g/dL — ABNORMAL LOW (ref 11.7–15.5)
MCH: 29.9 pg (ref 27.0–33.0)
MPV: 10.1 fL (ref 7.5–12.5)
Monocytes Relative: 5.9 %
Neutro Abs: 5951 cells/uL (ref 1500–7800)
Neutrophils Relative %: 68.4 %

## 2023-04-05 LAB — POCT GLYCOSYLATED HEMOGLOBIN (HGB A1C): Hemoglobin A1C: 6 % — AB (ref 4.0–5.6)

## 2023-04-05 MED ORDER — METFORMIN HCL ER 750 MG PO TB24: 1500.0000 mg | ORAL_TABLET | Freq: Every day | ORAL | 0 refills | Status: DC

## 2023-04-05 MED ORDER — CARVEDILOL 3.125 MG PO TABS: 3.1250 mg | ORAL_TABLET | Freq: Two times a day (BID) | ORAL | 1 refills | Status: DC

## 2023-04-05 NOTE — Patient Instructions (Addendum)
Go down on carvedilol from 6.25 to 3.125  Go up on Metformin to two daily   Go down on Insulin to 20 units and check glucose every morning if glucose remains between 100-130 you can go down by 2 units every 3 days

## 2023-04-06 LAB — CBC WITH DIFFERENTIAL/PLATELET
Absolute Monocytes: 513 cells/uL (ref 200–950)
Basophils Absolute: 26 cells/uL (ref 0–200)
Basophils Relative: 0.3 %
Eosinophils Absolute: 174 cells/uL (ref 15–500)
HCT: 32.1 % — ABNORMAL LOW (ref 35.0–45.0)
Lymphs Abs: 2036 cells/uL (ref 850–3900)
MCHC: 34 g/dL (ref 32.0–36.0)
MCV: 87.9 fL (ref 80.0–100.0)
Platelets: 344 10*3/uL (ref 140–400)
RBC: 3.65 10*6/uL — ABNORMAL LOW (ref 3.80–5.10)
RDW: 13.3 % (ref 11.0–15.0)
Total Lymphocyte: 23.4 %
WBC: 8.7 10*3/uL (ref 3.8–10.8)

## 2023-04-06 LAB — COMPLETE METABOLIC PANEL WITH GFR
AG Ratio: 1.4 (calc) (ref 1.0–2.5)
ALT: 17 U/L (ref 6–29)
AST: 21 U/L (ref 10–35)
Albumin: 4 g/dL (ref 3.6–5.1)
Alkaline phosphatase (APISO): 124 U/L (ref 37–153)
BUN: 11 mg/dL (ref 7–25)
CO2: 28 mmol/L (ref 20–32)
Calcium: 9.5 mg/dL (ref 8.6–10.4)
Chloride: 104 mmol/L (ref 98–110)
Creat: 0.95 mg/dL (ref 0.50–1.05)
Globulin: 2.9 g/dL (calc) (ref 1.9–3.7)
Glucose, Bld: 99 mg/dL (ref 65–99)
Potassium: 3.4 mmol/L — ABNORMAL LOW (ref 3.5–5.3)
Sodium: 144 mmol/L (ref 135–146)
Total Bilirubin: 0.4 mg/dL (ref 0.2–1.2)
Total Protein: 6.9 g/dL (ref 6.1–8.1)
eGFR: 65 mL/min/{1.73_m2} (ref >=60)

## 2023-04-06 LAB — LIPID PANEL
Cholesterol: 95 mg/dL (ref <200)
HDL: 33 mg/dL — ABNORMAL LOW (ref >=50)
LDL Cholesterol (Calc): 44 mg/dL (calc)
Non-HDL Cholesterol (Calc): 62 mg/dL (calc) (ref <130)
Total CHOL/HDL Ratio: 2.9 (calc) (ref <5.0)
Triglycerides: 95 mg/dL (ref <150)

## 2023-04-06 LAB — VITAMIN D 25 HYDROXY (VIT D DEFICIENCY, FRACTURES): Vit D, 25-Hydroxy: 46 ng/mL (ref 30–100)

## 2023-04-06 LAB — MICROALBUMIN / CREATININE URINE RATIO
Creatinine, Urine: 40 mg/dL (ref 20–275)
Microalb Creat Ratio: 1218 mg/g creat — ABNORMAL HIGH (ref <30)
Microalb, Ur: 48.7 mg/dL

## 2023-04-12 ENCOUNTER — Ambulatory Visit: Admission: RE | Admit: 2023-04-12 | Payer: Medicare PPO | Source: Ambulatory Visit

## 2023-04-12 DIAGNOSIS — Z1231 Encounter for screening mammogram for malignant neoplasm of breast: Secondary | ICD-10-CM | POA: Diagnosis not present

## 2023-04-18 ENCOUNTER — Ambulatory Visit (INDEPENDENT_AMBULATORY_CARE_PROVIDER_SITE_OTHER): Payer: Medicare PPO | Admitting: Vascular Surgery

## 2023-04-18 ENCOUNTER — Encounter (INDEPENDENT_AMBULATORY_CARE_PROVIDER_SITE_OTHER): Payer: Medicare PPO

## 2023-04-30 ENCOUNTER — Other Ambulatory Visit (INDEPENDENT_AMBULATORY_CARE_PROVIDER_SITE_OTHER): Payer: Self-pay | Admitting: Nurse Practitioner

## 2023-04-30 DIAGNOSIS — I6523 Occlusion and stenosis of bilateral carotid arteries: Secondary | ICD-10-CM

## 2023-05-02 ENCOUNTER — Telehealth: Payer: Self-pay

## 2023-05-02 ENCOUNTER — Ambulatory Visit: Payer: Medicare PPO

## 2023-05-02 ENCOUNTER — Ambulatory Visit (INDEPENDENT_AMBULATORY_CARE_PROVIDER_SITE_OTHER): Payer: Medicare PPO

## 2023-05-02 ENCOUNTER — Ambulatory Visit (INDEPENDENT_AMBULATORY_CARE_PROVIDER_SITE_OTHER): Payer: Medicare PPO | Admitting: Vascular Surgery

## 2023-05-02 DIAGNOSIS — I6523 Occlusion and stenosis of bilateral carotid arteries: Secondary | ICD-10-CM | POA: Diagnosis not present

## 2023-05-02 NOTE — Progress Notes (Signed)
Patient presented to clinic in expressed good health today for a blood pressure check. Measure obtained after letting the patient rest for 5 minutes. Patient was pleasant, cooperative and had no questions or concerns to express at time of clinic departure. Physician notified, awaiting advice.

## 2023-05-03 NOTE — Telephone Encounter (Signed)
No change in medications

## 2023-05-13 ENCOUNTER — Other Ambulatory Visit: Payer: Self-pay | Admitting: Family Medicine

## 2023-05-13 DIAGNOSIS — E1169 Type 2 diabetes mellitus with other specified complication: Secondary | ICD-10-CM

## 2023-05-17 DIAGNOSIS — E113312 Type 2 diabetes mellitus with moderate nonproliferative diabetic retinopathy with macular edema, left eye: Secondary | ICD-10-CM | POA: Diagnosis not present

## 2023-05-17 DIAGNOSIS — H35033 Hypertensive retinopathy, bilateral: Secondary | ICD-10-CM | POA: Diagnosis not present

## 2023-05-17 DIAGNOSIS — E113311 Type 2 diabetes mellitus with moderate nonproliferative diabetic retinopathy with macular edema, right eye: Secondary | ICD-10-CM | POA: Diagnosis not present

## 2023-05-17 DIAGNOSIS — H3582 Retinal ischemia: Secondary | ICD-10-CM | POA: Diagnosis not present

## 2023-05-17 DIAGNOSIS — H43813 Vitreous degeneration, bilateral: Secondary | ICD-10-CM | POA: Diagnosis not present

## 2023-05-17 LAB — HM DIABETES EYE EXAM

## 2023-05-24 DIAGNOSIS — G4733 Obstructive sleep apnea (adult) (pediatric): Secondary | ICD-10-CM | POA: Diagnosis not present

## 2023-06-07 ENCOUNTER — Other Ambulatory Visit: Payer: Self-pay | Admitting: Family Medicine

## 2023-06-07 DIAGNOSIS — I10 Essential (primary) hypertension: Secondary | ICD-10-CM

## 2023-06-09 ENCOUNTER — Other Ambulatory Visit: Payer: Self-pay | Admitting: Family Medicine

## 2023-06-09 DIAGNOSIS — I10 Essential (primary) hypertension: Secondary | ICD-10-CM

## 2023-06-16 ENCOUNTER — Other Ambulatory Visit: Payer: Self-pay | Admitting: Family Medicine

## 2023-06-16 DIAGNOSIS — I69359 Hemiplegia and hemiparesis following cerebral infarction affecting unspecified side: Secondary | ICD-10-CM

## 2023-06-16 DIAGNOSIS — E1129 Type 2 diabetes mellitus with other diabetic kidney complication: Secondary | ICD-10-CM

## 2023-06-21 DIAGNOSIS — H40019 Open angle with borderline findings, low risk, unspecified eye: Secondary | ICD-10-CM | POA: Diagnosis not present

## 2023-06-21 DIAGNOSIS — H35033 Hypertensive retinopathy, bilateral: Secondary | ICD-10-CM | POA: Diagnosis not present

## 2023-06-21 DIAGNOSIS — E113313 Type 2 diabetes mellitus with moderate nonproliferative diabetic retinopathy with macular edema, bilateral: Secondary | ICD-10-CM | POA: Diagnosis not present

## 2023-06-21 DIAGNOSIS — I6930 Unspecified sequelae of cerebral infarction: Secondary | ICD-10-CM | POA: Diagnosis not present

## 2023-06-28 DIAGNOSIS — E113312 Type 2 diabetes mellitus with moderate nonproliferative diabetic retinopathy with macular edema, left eye: Secondary | ICD-10-CM | POA: Diagnosis not present

## 2023-07-07 ENCOUNTER — Other Ambulatory Visit: Payer: Self-pay | Admitting: Family Medicine

## 2023-07-07 DIAGNOSIS — I69359 Hemiplegia and hemiparesis following cerebral infarction affecting unspecified side: Secondary | ICD-10-CM

## 2023-07-08 ENCOUNTER — Other Ambulatory Visit: Payer: Self-pay | Admitting: Family Medicine

## 2023-07-08 DIAGNOSIS — E1159 Type 2 diabetes mellitus with other circulatory complications: Secondary | ICD-10-CM

## 2023-07-18 ENCOUNTER — Encounter: Payer: Self-pay | Admitting: Family Medicine

## 2023-07-19 ENCOUNTER — Ambulatory Visit: Payer: Medicare PPO | Admitting: Family Medicine

## 2023-07-30 ENCOUNTER — Other Ambulatory Visit: Payer: Self-pay | Admitting: Family Medicine

## 2023-07-30 DIAGNOSIS — I69359 Hemiplegia and hemiparesis following cerebral infarction affecting unspecified side: Secondary | ICD-10-CM

## 2023-08-09 DIAGNOSIS — H3582 Retinal ischemia: Secondary | ICD-10-CM | POA: Diagnosis not present

## 2023-08-09 DIAGNOSIS — H35033 Hypertensive retinopathy, bilateral: Secondary | ICD-10-CM | POA: Diagnosis not present

## 2023-08-09 DIAGNOSIS — H43813 Vitreous degeneration, bilateral: Secondary | ICD-10-CM | POA: Diagnosis not present

## 2023-08-09 DIAGNOSIS — E113311 Type 2 diabetes mellitus with moderate nonproliferative diabetic retinopathy with macular edema, right eye: Secondary | ICD-10-CM | POA: Diagnosis not present

## 2023-08-19 ENCOUNTER — Encounter: Payer: Self-pay | Admitting: Internal Medicine

## 2023-08-19 ENCOUNTER — Ambulatory Visit: Payer: Medicare PPO | Admitting: Internal Medicine

## 2023-08-19 VITALS — BP 135/70 | HR 75 | Temp 98.6°F | Resp 16 | Ht 64.0 in | Wt 200.4 lb

## 2023-08-19 DIAGNOSIS — Z7189 Other specified counseling: Secondary | ICD-10-CM | POA: Diagnosis not present

## 2023-08-19 DIAGNOSIS — G4733 Obstructive sleep apnea (adult) (pediatric): Secondary | ICD-10-CM | POA: Diagnosis not present

## 2023-08-19 NOTE — Progress Notes (Signed)
[facility] [address] [place], [place] [address]  Pulmonary Sleep Medicine   Office Visit Note  Patient Name: Meagan [name] DOB: [birth year] MRN [id number]  Date of Service: 08/19/2023  Complaints/HPI: She is doing well overall. She states she has been using her CPAP as prescribed. She has some mask leak issues. She states that she has adjusted the mask but is not sure how old the mask is. She states she uses [employer] for supplies. Her compliance report shows 100% on the mask/ She has an AHI of 0.6 per hour  Office Spirometry Results:     ROS  General: (-) fever, (-) chills, (-) night sweats, (-) weakness Skin: (-) rashes, (-) itching,. Eyes: (-) visual changes, (-) redness, (-) itching. Nose and Sinuses: (-) nasal stuffiness or itchiness, (-) postnasal drip, (-) nosebleeds, (-) sinus trouble. Mouth and Throat: (-) sore throat, (-) hoarseness. Neck: (-) swollen glands, (-) enlarged thyroid, (-) neck pain. Respiratory: - cough, (-) bloody sputum, - shortness of breath, - wheezing. Cardiovascular: - ankle swelling, (-) chest pain. Lymphatic: (-) lymph node enlargement. Neurologic: (-) numbness, (-) tingling. Psychiatric: (-) anxiety, (-) depression   Current Medication: Outpatient Encounter Medications as of 08/19/2023  Medication Sig   amLODipine (NORVASC) 5 MG tablet TAKE 1 TABLET (5 MG TOTAL) BY MOUTH DAILY.   atorvastatin (LIPITOR) 80 MG tablet TAKE 1 TABLET BY MOUTH DAILY AT 6 PM.   BD PEN NEEDLE NANO 2ND GEN 32G X 4 MM MISC USE AS DIRECTED   Blood Glucose Monitoring Suppl (ACCU-CHEK GUIDE ME) w/Device KIT CHECK IN THE MORNING AND AT BEDTIME. CHECK TWICE DAILY ICD-10 E10.9, E11.9   carvedilol (COREG) 3.125 MG tablet TAKE 1 TABLET BY MOUTH TWICE A DAY WITH A MEAL   clopidogrel (PLAVIX) 75 MG tablet TAKE 1 TABLET BY MOUTH EVERY DAY   docusate sodium (COLACE) 100 MG capsule Take 1 capsule (100 mg total) by mouth daily as needed for mild constipation.    dorzolamide-timolol (COSOPT) 22.3-6.8 MG/ML ophthalmic solution SMARTSIG:In Eye(s)   glucose blood (ONETOUCH ULTRA TEST) test strip Use as instructed   insulin glargine, 2 Unit Dial, (TOUJEO MAX SOLOSTAR) 300 UNIT/ML Solostar Pen Inject 24-50 Units into the skin daily at 12 noon. In place of Tresiba   levocetirizine (XYZAL) 5 MG tablet Take 5 mg by mouth daily as needed for allergies.    metFORMIN (GLUCOPHAGE-XR) 750 MG 24 hr tablet Take 2 tablets (1,500 mg total) by mouth daily with breakfast. Double what you have at home   olmesartan-hydrochlorothiazide (BENICAR HCT) 40-25 MG tablet TAKE 1 TABLET BY MOUTH EVERY DAY   polyethylene glycol (MIRALAX / GLYCOLAX) 17 g packet Take 17 g by mouth daily.   RYBELSUS 14 MG TABS TAKE 1 TABLET (14 MG TOTAL) BY MOUTH DAILY   No facility-administered encounter medications on file as of 08/19/2023.    Surgical History: Past Surgical History:  Procedure Laterality Date   APPENDECTOMY  2000s   BIOPSY THYROID  2006   [facility]   BREAST EXCISIONAL BIOPSY Left 2000s   neg   CARPAL TUNNEL RELEASE Left 12/2013   CATARACT EXTRACTION, BILATERAL Bilateral    Right 12/14/21 and Left 01/04/22   COLONOSCOPY  2011   DR[facility]WOH[facility]   COLONOSCOPY WITH PROPOFOL N/A 07/11/2017   Procedure: COLONOSCOPY WITH PROPOFOL;  Surgeon: Wyline Mood, MD;  Location: [facility] ENDOSCOPY;  Service: Gastroenterology;  Laterality: N/A;   COLONOSCOPY WITH PROPOFOL N/A 01/04/2023   Procedure: COLONOSCOPY WITH PROPOFOL;  Surgeon: Wyline Mood, MD;  Location: [facility] ENDOSCOPY;  Service: Gastroenterology;  Laterality: N/A;   EYE SURGERY     ganglion cyst removal      KNEE SURGERY Left 08/2009   KNEE SURGERY Right 04/07/2010   REFRACTIVE SURGERY Bilateral    ROTATOR CUFF REPAIR Right 2008   TUBAL LIGATION      Medical History: Past Medical History:  Diagnosis Date   Cellulitis    left leg   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   Diffuse cystic mastopathy    Fall 2013    Hypertension 2004   Occlusion and stenosis of carotid artery without mention of cerebral infarction    Sleep apnea    Stroke [facility])     Family History: Family History  Problem Relation Age of Onset   Breast cancer Maternal Aunt    Hypertension Mother    Kidney Stones Mother    Hypertension Father    Congestive Heart Failure Father    Hypertension Paternal Grandfather    Diabetes Sister    Vision loss Sister    Varicose Veins Sister    Hypertension Sister    Breast cancer Sister 2       Double Mastectomy   Hypertension Sister     Social History: Social History   Socioeconomic History   Marital status: Widowed    Spouse name: Not on file   Number of children: 4   Years of education: Not on file   Highest education level: Master's degree (e.g., MA, MS, MEng, MEd, MSW, MBA)  Occupational History   Occupation: Primary school teacher   Tobacco Use   Smoking status: Never   Smokeless tobacco: Never  Vaping Use   Vaping status: Never Used  Substance and Sexual Activity   Alcohol use: No   Drug use: No   Sexual activity: Not Currently    Partners: Male    Comment: Widow for 14 years  Other Topics Concern   Not on file  Social History Narrative   She had a stroke on 06/2019, she has left peripheral vision loss since stroke   She moved in with her youngest sister    Social Determinants of Health   Financial Resource Strain: Low Risk  (11/22/2022)   Overall Financial Resource Strain (CARDIA)    Difficulty of Paying Living Expenses: Not hard at all  Food Insecurity: No Food Insecurity (11/22/2022)   Hunger Vital Sign    Worried About Running Out of Food in the Last Year: Never true    Ran Out of Food in the Last Year: Never true  Transportation Needs: No Transportation Needs (11/22/2022)   PRAPARE - Administrator, Civil Service (Medical): No    Lack of Transportation (Non-Medical): No  Physical Activity: Insufficiently Active (11/22/2022)   Exercise Vital Sign     Days of Exercise per Week: 3 days    Minutes of Exercise per Session: 30 min  Stress: No Stress Concern Present (11/22/2022)   [employer] of Occupational Health - Occupational Stress Questionnaire    Feeling of Stress : Not at all  Social Connections: Moderately Integrated (11/22/2022)   Social Connection and Isolation Panel [NHANES]    Frequency of Communication with Friends and Family: More than three times a week    Frequency of Social Gatherings with Friends and Family: More than three times a week    Attends Religious Services: More than 4 times per year    Active Member of [employer] or Organizations:  Yes    Attends Club or Organization Meetings: More than 4 times per year    Marital Status: Widowed  Intimate Partner Violence: Not At Risk (11/22/2022)   Humiliation, Afraid, Rape, and Kick questionnaire    Fear of Current or Ex-Partner: No    Emotionally Abused: No    Physically Abused: No    Sexually Abused: No    Vital Signs: Blood pressure 135/70, pulse 75, temperature 98.6 F (37 C), resp. rate 16, height 5\' 4"  (1.626 m), weight 200 lb 6.4 oz (90.9 kg), SpO2 99%.  Examination: General Appearance: The patient is well-developed, well-nourished, and in no distress. Skin: Gross inspection of skin unremarkable. Head: normocephalic, no gross deformities. Eyes: no gross deformities noted. ENT: ears appear grossly normal no exudates. Neck: Supple. No thyromegaly. No LAD. Respiratory:  no rhonchi no rales are noted at this time. Cardiovascular: Normal S1 and S2 without murmur or rub. Extremities: No cyanosis. pulses are equal. Neurologic: Alert and oriented. No involuntary movements.  LABS: No results found for this or any previous visit (from the past 2160 hour(s)).  Radiology: MM 3D SCREENING MAMMOGRAM BILATERAL BREAST  Result Date: 04/15/2023 CLINICAL DATA:  Screening. EXAM: DIGITAL SCREENING BILATERAL MAMMOGRAM WITH TOMOSYNTHESIS AND CAD TECHNIQUE: Bilateral screening  digital craniocaudal and mediolateral oblique mammograms were obtained. Bilateral screening digital breast tomosynthesis was performed. The images were evaluated with computer-aided detection. COMPARISON:  Previous exam(s). ACR Breast Density Category c: The breasts are heterogeneously dense, which may obscure small masses. FINDINGS: There are no findings suspicious for malignancy. IMPRESSION: No mammographic evidence of malignancy. A result letter of this screening mammogram will be mailed directly to the patient. RECOMMENDATION: Screening mammogram in one year. (Code:SM-B-01Y) BI-RADS CATEGORY  1: Negative. Electronically Signed   By: Bary Richard M.D.   On: 04/15/2023 10:18    No results found.  No results found.  Assessment and Plan: Patient Active Problem List   Diagnosis Date Noted   History of colonic polyps 01/04/2023   Adenomatous polyp of colon 01/04/2023   Anemia in chronic kidney disease 12/21/2021   Chronic kidney disease, stage 3a (HCC) 12/21/2021   History of CVA with residual deficit 07/01/2019   Morbid obesity (HCC) 10/22/2018   Dyslipidemia associated with type 2 diabetes mellitus (HCC) 12/04/2016   Controlled type 2 diabetes mellitus with microalbuminuria (HCC) 12/04/2016   Seasonal allergies 02/01/2016   OSA (obstructive sleep apnea) 10/06/2015   Hypertensive retinopathy 10/06/2015   Thyroid cyst 10/06/2015   Dyslipidemia 10/06/2015   Controlled type 2 diabetes with neuropathy (HCC) 10/06/2015   History of pneumonia 10/06/2015   Chronic neck pain 10/06/2015   Carotid stenosis    Hypertension, benign     1. OSA on CPAP  Ostructive sleep apnea patient is on CPAP therapy will continue with the on current settings and support.  Patient also was instructed on proper cleaning of the device  2. CPAP use counseling CPAP Counseling: had a lengthy discussion with the patient regarding the importance of PAP therapy in management of the sleep apnea. Patient appears to  understand the risk factor reduction and also understands the risks associated with untreated sleep apnea.   3. Morbid obesity (HCC) Obesity Counseling: Had a lengthy discussion regarding patients BMI and weight issues. Patient was instructed on portion control as well as increased activity. Also discussed caloric restrictions with trying to maintain intake less than 2000 Kcal. Discussions were made in accordance with the 5As of weight management. Simple actions such as not eating  late and if able to, taking a walk is suggested.    General Counseling: I have discussed the findings of the evaluation and examination with Colie.  I have also discussed any further diagnostic evaluation thatmay be needed or ordered today. Junko verbalizes understanding of the findings of todays visit. We also reviewed her medications today and discussed drug interactions and side effects including but not limited excessive drowsiness and altered mental states. We also discussed that there is always a risk not just to her but also people around her. she has been encouraged to call the office with any questions or concerns that should arise related to todays visit.  No orders of the defined types were placed in this encounter.    Time spent: 45  I have personally obtained a history, examined the patient, evaluated laboratory and imaging results, formulated the assessment and plan and placed orders.    Yevonne Pax, MD [facility] Pulmonary and Critical Care Sleep medicine

## 2023-08-28 DIAGNOSIS — G4733 Obstructive sleep apnea (adult) (pediatric): Secondary | ICD-10-CM | POA: Diagnosis not present

## 2023-08-29 NOTE — Progress Notes (Signed)
Name: Meagan [name]   MRN: [id number]    DOB: [birth year]   Date:08/30/2023       Progress Note  Subjective  Chief Complaint  Follow Up  HPI  DMII: she states since she stopped Ozempic due to significant weight loss. She is currently taking Rybelsus 14 mg , Metformin ER  1500 mg daily and Toujeo 16 units today A1C is down from 6.5 % to 6 % but not as consistent with her diet and has not been doing her home exercises lately HTN and macroalbuminuria, she is on ARB, statin therapy. She is not able to tolerate SGL-2 agonist .   CKI stage III:  She is on ARB, she is unable to tolerate SGL2 agonists. She has good urine output , urine micro was over 1000 , advised her to contact [facility] to schedule a follow up   HTN: she is compliant with medications, bp slightly up today, however she came here straight from the dentist. Denies chest pain, palpitation.   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 Marland Kitchen She has regained some vision but still has some  peripheral vision loss on both eyes but worse on the left side.  She lives with her sister and needs assistance cooking, unable to drive, sister needs to help her choose her clothes and make sure inside out . She sees a retina sub-specialist Dr. Marella Chimes at [facility],  s/p cataract surgery , also has laser therapy on a regular basis .She still has left side hemiparesis , she needs to use her cane for balance and vision   OSA: seen by Dr. Welton Flakes and had sleep study, wearing cpap every night, tolerating it well. She has been compliant with her CPAP machine. She recently    Obesity: BMI is below 35 now, continue life style modification and DM, HTN control.  Weight is stable - she will try to eat dinner earlier and resume  chair exercises   Patient Active Problem List   Diagnosis Date Noted   History of colonic polyps 01/04/2023   Adenomatous polyp of colon 01/04/2023   Anemia in chronic kidney disease 12/21/2021   Chronic kidney disease, stage 3a (HCC) 12/21/2021   History of CVA with residual deficit 07/01/2019   Morbid obesity (HCC) 10/22/2018   Dyslipidemia associated with type 2 diabetes mellitus (HCC) 12/04/2016   Controlled type 2 diabetes mellitus with microalbuminuria (HCC) 12/04/2016   Seasonal allergies 02/01/2016   OSA (obstructive sleep apnea) 10/06/2015   Hypertensive retinopathy 10/06/2015   Thyroid cyst 10/06/2015   Dyslipidemia 10/06/2015   Controlled type 2 diabetes with neuropathy (HCC) 10/06/2015   History of pneumonia 10/06/2015   Chronic neck pain 10/06/2015   Bilateral carotid artery disease, unspecified type (HCC)    Hypertension, benign     Past Surgical History:  Procedure Laterality Date   APPENDECTOMY  2000s   BIOPSY THYROID  2006   [facility]   BREAST EXCISIONAL BIOPSY Left 2000s   neg   CARPAL TUNNEL RELEASE Left 12/2013   CATARACT EXTRACTION, BILATERAL Bilateral    Right 12/14/21 and Left 01/04/22   COLONOSCOPY  2011   DR.WOHL   COLONOSCOPY WITH PROPOFOL N/A 07/11/2017   Procedure: COLONOSCOPY WITH PROPOFOL;  Surgeon: Wyline Mood, MD;  Location:  [facility] ENDOSCOPY;  Service: Gastroenterology;  Laterality: N/A;   COLONOSCOPY WITH PROPOFOL N/A 01/04/2023   Procedure: COLONOSCOPY WITH PROPOFOL;  Surgeon: Wyline Mood, MD;  Location: [facility] ENDOSCOPY;  Service: Gastroenterology;  Laterality: N/A;   EYE SURGERY     ganglion cyst removal      KNEE SURGERY Left 08/2009   KNEE SURGERY Right 04/07/2010   REFRACTIVE SURGERY Bilateral    ROTATOR CUFF REPAIR Right 2008   TUBAL LIGATION      Family History  Problem Relation Age of Onset   Breast cancer Maternal Aunt    Hypertension Mother    Kidney Stones Mother    Hypertension Father    Congestive Heart Failure Father     Hypertension Paternal Grandfather    Diabetes Sister    Vision loss Sister    Varicose Veins Sister    Hypertension Sister    Breast cancer Sister 73       Double Mastectomy   Hypertension Sister     Social History   Tobacco Use   Smoking status: Never   Smokeless tobacco: Never  Substance Use Topics   Alcohol use: No     Current Outpatient Medications:    amLODipine (NORVASC) 5 MG tablet, TAKE 1 TABLET (5 MG TOTAL) BY MOUTH DAILY., Disp: 90 tablet, Rfl: 1   BD PEN NEEDLE NANO 2ND GEN 32G X 4 MM MISC, USE AS DIRECTED, Disp: 100 each, Rfl: 1   Blood Glucose Monitoring Suppl (ACCU-CHEK GUIDE ME) w/Device KIT, CHECK IN THE MORNING AND AT BEDTIME. CHECK TWICE DAILY ICD-10 E10.9, E11.9, Disp: 1 kit, Rfl: 0   clopidogrel (PLAVIX) 75 MG tablet, TAKE 1 TABLET BY MOUTH EVERY DAY, Disp: 90 tablet, Rfl: 1   docusate sodium (COLACE) 100 MG capsule, Take 1 capsule (100 mg total) by mouth daily as needed for mild constipation., Disp: 60 capsule, Rfl: 0   dorzolamide-timolol (COSOPT) 22.3-6.8 MG/ML ophthalmic solution, SMARTSIG:In Eye(s), Disp: , Rfl:    glucose blood (ONETOUCH ULTRA TEST) test strip, Use as instructed, Disp: 100 each, Rfl: 12   insulin glargine, 2 Unit Dial, (TOUJEO MAX SOLOSTAR) 300 UNIT/ML Solostar Pen, Inject 24-50 Units into the skin daily at 12 noon. In place of Tresiba, Disp: 15 mL, Rfl: 0   levocetirizine (XYZAL) 5 MG tablet, Take 5 mg by mouth daily as needed for allergies. , Disp: , Rfl:    olmesartan-hydrochlorothiazide (BENICAR HCT) 40-25 MG tablet, TAKE 1 TABLET BY MOUTH EVERY DAY, Disp: 90 tablet, Rfl: 1   polyethylene glycol (MIRALAX / GLYCOLAX) 17 g packet, Take 17 g by mouth daily., Disp: 14 each, Rfl: 0   RYBELSUS 14 MG TABS, TAKE 1 TABLET (14 MG TOTAL) BY MOUTH DAILY, Disp: 90 tablet, Rfl: 1   atorvastatin (LIPITOR) 80 MG tablet, TAKE 1 TABLET BY MOUTH DAILY AT 6 PM., Disp: 90 tablet, Rfl: 1   carvedilol (COREG) 3.125 MG tablet, Take 1 tablet (3.125 mg total) by  mouth 2 (two) times daily with a meal., Disp: 180 tablet, Rfl: 1   metFORMIN (GLUCOPHAGE-XR) 750 MG 24 hr tablet, Take 2 tablets (1,500 mg total) by mouth daily with breakfast. Double what you have at home, Disp: 180 tablet, Rfl: 1  Allergies  Allergen Reactions   Jardiance [Empagliflozin] Rash   Amoxicillin     rash   Bee Venom Swelling and Other (See Comments)    Immediately takes Benadryl to counteract   Trulicity [Dulaglutide] Hives   Farxiga [Dapagliflozin] Rash    Pt reports  that she developed a rash when she took [place] and she would like it to be added as an allergy   Keflex [Cephalexin] Rash    I personally reviewed active problem list, medication list, allergies, family history, social history, health maintenance with the patient/caregiver today.   ROS  Ten systems reviewed and is negative except as mentioned in HPI    Objective  Vitals:   08/30/23 1313  BP: 138/70  Pulse: 84  Resp: 16  SpO2: 99%  Weight: 199 lb (90.3 kg)  Height: 5\' 4"  (1.626 m)    Body mass index is 34.16 kg/m.  Physical Exam  Constitutional: Patient appears well-developed and well-nourished. Obese  No distress.  HEENT: head atraumatic, normocephalic, pupils equal and reactive to light, neck supple Cardiovascular: Normal rate, regular rhythm and normal heart sounds.  No murmur heard. No BLE edema. Pulmonary/Chest: Effort normal and breath sounds normal. No respiratory distress. Abdominal: Soft.  There is no tenderness. Psychiatric: Patient has a normal mood and affect. behavior is normal. Judgment and thought content normal.   Recent Results (from the past 2160 hour(s))  POCT HgB A1C     Status: Abnormal   Collection Time: 08/30/23  1:14 PM  Result Value Ref Range   Hemoglobin A1C 6.9 (A) 4.0 - 5.6 %   HbA1c POC (<> result, manual entry)     HbA1c, POC (prediabetic range)     HbA1c, POC (controlled diabetic range)      Diabetic Foot Exam: Diabetic Foot Exam - Simple   Simple  Foot Form Visual Inspection See comments: Yes Sensation Testing Intact to touch and monofilament testing bilaterally: Yes Pulse Check Posterior Tibialis and Dorsalis pulse intact bilaterally: Yes Comments 2nd toe right foot thick and dark - chronic       PHQ2/9:    08/30/2023    1:13 PM 04/05/2023    9:02 AM 11/28/2022    2:34 PM 11/22/2022    2:44 PM 08/16/2022    1:44 PM  Depression screen PHQ 2/9  Decreased Interest 0 0 0 0 0  Down, Depressed, Hopeless 0 0 0 0 0  PHQ - 2 Score 0 0 0 0 0  Altered sleeping 0 0 0    Tired, decreased energy 0 0 0    Change in appetite 0 0 0    Feeling bad or failure about yourself  0 0 0    Trouble concentrating 0 0 0    Moving slowly or fidgety/restless 0 0 0    Suicidal thoughts 0 0 0    PHQ-9 Score 0 0 0      phq 9 is negative   Fall Risk:    08/30/2023    1:13 PM 04/05/2023    9:02 AM 11/28/2022    2:34 PM 11/22/2022    2:40 PM 08/16/2022    1:44 PM  Fall Risk   Falls in the past year? 0 0 0 0 0  Number falls in past yr: 0 0  0 0  Injury with Fall? 0 0  0 0  Risk for fall due to : No Fall Risks No Fall Risks No Fall Risks No Fall Risks No Fall Risks  Follow up Falls prevention discussed Falls prevention discussed Falls prevention discussed Education provided;Falls prevention discussed Falls evaluation completed      Functional Status Survey: Is the patient deaf or have difficulty hearing?: No Does the patient have difficulty seeing, even when wearing glasses/contacts?: No Does the patient have difficulty  concentrating, remembering, or making decisions?: No Does the patient have difficulty walking or climbing stairs?: Yes Does the patient have difficulty dressing or bathing?: No Does the patient have difficulty doing errands alone such as visiting a doctor's office or shopping?: Yes    Assessment & Plan  1. Dyslipidemia associated with type 2 diabetes mellitus (HCC)  - POCT HgB A1C - HM Diabetes Foot Exam  2.  Hemiparesis affecting nondominant side as late effect of cerebrovascular accident (CVA) (HCC)  - atorvastatin (LIPITOR) 80 MG tablet; TAKE 1 TABLET BY MOUTH DAILY AT 6 PM.  Dispense: 90 tablet; Refill: 1  3. Bilateral carotid artery disease, unspecified type (HCC)  Continue statin therapy  4. Chronic kidney disease, stage 3a (HCC)   5. Controlled type 2 diabetes mellitus with microalbuminuria, with long-term current use of insulin (HCC)  - metFORMIN (GLUCOPHAGE-XR) 750 MG 24 hr tablet; Take 2 tablets (1,500 mg total) by mouth daily with breakfast. Double what you have at home  Dispense: 180 tablet; Refill: 1  6. Hypertension associated with diabetes (HCC)  - carvedilol (COREG) 3.125 MG tablet; Take 1 tablet (3.125 mg total) by mouth 2 (two) times daily with a meal.  Dispense: 180 tablet; Refill: 1  7. OSA (obstructive sleep apnea)  Continue cpap use  8. Need for immunization against influenza  - Flu Vaccine Trivalent High Dose (Fluad)

## 2023-08-30 ENCOUNTER — Ambulatory Visit (INDEPENDENT_AMBULATORY_CARE_PROVIDER_SITE_OTHER): Payer: Medicare PPO | Admitting: Family Medicine

## 2023-08-30 ENCOUNTER — Ambulatory Visit: Payer: Medicare PPO | Admitting: Family Medicine

## 2023-08-30 ENCOUNTER — Encounter: Payer: Self-pay | Admitting: Family Medicine

## 2023-08-30 VITALS — BP 138/70 | HR 84 | Resp 16 | Ht 64.0 in | Wt 199.0 lb

## 2023-08-30 DIAGNOSIS — I69359 Hemiplegia and hemiparesis following cerebral infarction affecting unspecified side: Secondary | ICD-10-CM | POA: Diagnosis not present

## 2023-08-30 DIAGNOSIS — Z23 Encounter for immunization: Secondary | ICD-10-CM | POA: Diagnosis not present

## 2023-08-30 DIAGNOSIS — G4733 Obstructive sleep apnea (adult) (pediatric): Secondary | ICD-10-CM | POA: Diagnosis not present

## 2023-08-30 DIAGNOSIS — N1831 Chronic kidney disease, stage 3a: Secondary | ICD-10-CM | POA: Diagnosis not present

## 2023-08-30 DIAGNOSIS — E1159 Type 2 diabetes mellitus with other circulatory complications: Secondary | ICD-10-CM

## 2023-08-30 DIAGNOSIS — E1169 Type 2 diabetes mellitus with other specified complication: Secondary | ICD-10-CM

## 2023-08-30 DIAGNOSIS — R809 Proteinuria, unspecified: Secondary | ICD-10-CM

## 2023-08-30 DIAGNOSIS — E1129 Type 2 diabetes mellitus with other diabetic kidney complication: Secondary | ICD-10-CM | POA: Diagnosis not present

## 2023-08-30 DIAGNOSIS — I779 Disorder of arteries and arterioles, unspecified: Secondary | ICD-10-CM | POA: Diagnosis not present

## 2023-08-30 DIAGNOSIS — I152 Hypertension secondary to endocrine disorders: Secondary | ICD-10-CM

## 2023-08-30 DIAGNOSIS — E785 Hyperlipidemia, unspecified: Secondary | ICD-10-CM | POA: Diagnosis not present

## 2023-08-30 DIAGNOSIS — Z794 Long term (current) use of insulin: Secondary | ICD-10-CM

## 2023-08-30 LAB — POCT GLYCOSYLATED HEMOGLOBIN (HGB A1C): Hemoglobin A1C: 6.9 % — AB (ref 4.0–5.6)

## 2023-08-30 MED ORDER — CARVEDILOL 3.125 MG PO TABS: 3.1250 mg | ORAL_TABLET | Freq: Two times a day (BID) | ORAL | 1 refills | Status: DC

## 2023-08-30 MED ORDER — ATORVASTATIN CALCIUM 80 MG PO TABS: ORAL_TABLET | ORAL | 1 refills | Status: DC

## 2023-08-30 MED ORDER — METFORMIN HCL ER 750 MG PO TB24: 1500.0000 mg | ORAL_TABLET | Freq: Every day | ORAL | 1 refills | Status: DC

## 2023-09-15 ENCOUNTER — Other Ambulatory Visit: Payer: Self-pay | Admitting: Family Medicine

## 2023-09-15 DIAGNOSIS — I10 Essential (primary) hypertension: Secondary | ICD-10-CM

## 2023-09-20 ENCOUNTER — Telehealth: Payer: Self-pay | Admitting: Family Medicine

## 2023-09-20 ENCOUNTER — Other Ambulatory Visit: Payer: Self-pay | Admitting: Family Medicine

## 2023-09-20 DIAGNOSIS — E1159 Type 2 diabetes mellitus with other circulatory complications: Secondary | ICD-10-CM

## 2023-09-20 DIAGNOSIS — Z794 Long term (current) use of insulin: Secondary | ICD-10-CM

## 2023-09-20 NOTE — Telephone Encounter (Signed)
Already requested today by the pharmacy in a separate refill encounter and routed to the provider.

## 2023-09-20 NOTE — Telephone Encounter (Signed)
Copied from CRM [id number]. Topic: General - Other >> Sep 20, 2023 11:15 AM Everette C wrote: Reason for CRM: Medication Refill - Most Recent Primary Care Visit:  Provider: Alba Cory Department: [facility] Visit Type: OFFICE VISIT Date: 08/30/2023  Medication: insulin glargine, 2 Unit Dial, (TOUJEO MAX SOLOSTAR) 300 UNIT/ML Solostar Pen [[id number]]  Has the patient contacted their pharmacy? Yes (Agent: If no, request that the patient contact the pharmacy for the refill. If patient does not wish to contact the pharmacy document the reason why and proceed with request.) (Agent: If yes, when and what did the pharmacy advise?)  Is this the correct pharmacy for this prescription? Yes If no, delete pharmacy and type the correct one.  This is the patient's preferred pharmacy:  [facility] [address], [place] - [address] [place] [address] Phone: [phone] Fax: [phone]   Has the prescription been filled recently? Yes  Is the patient out of the medication? Yes  Has the patient been seen for an appointment in the last year OR does the patient have an upcoming appointment? Yes  Can we respond through MyChart? No  Agent: Please be advised that Rx refills may take up to 3 business days. We ask that you follow-up with your pharmacy.

## 2023-09-23 DIAGNOSIS — E113312 Type 2 diabetes mellitus with moderate nonproliferative diabetic retinopathy with macular edema, left eye: Secondary | ICD-10-CM | POA: Diagnosis not present

## 2023-10-24 ENCOUNTER — Telehealth: Payer: Self-pay | Admitting: Family Medicine

## 2023-10-24 NOTE — Telephone Encounter (Signed)
Copied from CRM [id number]. Topic: General - Other >> Oct 23, 2023  2:47 PM Santiya F wrote: Reason for CRM: Pt is calling in because she received a notification that Dr. Carlynn Purl is no longer accepting [facility] and she wants to know if that is true. Please follow up with pt. Pt would also like to discuss a bill she received in November.

## 2023-11-04 DIAGNOSIS — H43813 Vitreous degeneration, bilateral: Secondary | ICD-10-CM | POA: Diagnosis not present

## 2023-11-04 DIAGNOSIS — H35033 Hypertensive retinopathy, bilateral: Secondary | ICD-10-CM | POA: Diagnosis not present

## 2023-11-04 DIAGNOSIS — E113311 Type 2 diabetes mellitus with moderate nonproliferative diabetic retinopathy with macular edema, right eye: Secondary | ICD-10-CM | POA: Diagnosis not present

## 2023-11-04 DIAGNOSIS — H3582 Retinal ischemia: Secondary | ICD-10-CM | POA: Diagnosis not present

## 2023-11-22 ENCOUNTER — Other Ambulatory Visit: Payer: Self-pay | Admitting: Family Medicine

## 2023-11-22 NOTE — Telephone Encounter (Signed)
Medication Refill -  Most Recent Primary Care Visit:  Provider: Alba Cory  Department: [facility]  Visit Type: OFFICE VISIT  Date: 08/30/2023  Medication: glucose blood (ONETOUCH ULTRA TEST) test strip   Has the patient contacted their pharmacy? Yes Patient states that pharmacy said she needed to contact provider for refills.  Is this the correct pharmacy for this prescription? Yes If no, delete pharmacy and type the correct one.  This is the patient's preferred pharmacy:  [facility] [address] [place] - [address] [place] [address] Phone: [phone] Fax: [phone]   Has the prescription been filled recently? Yes  Is the patient out of the medication? Yes  Has the patient been seen for an appointment in the last year OR does the patient have an upcoming appointment? Yes  Can we respond through MyChart? Yes  Agent: Please be advised that Rx refills may take up to 3 business days. We ask that you follow-up with your pharmacy.

## 2023-11-25 MED ORDER — ONETOUCH ULTRA TEST VI STRP: ORAL_STRIP | 12 refills | Status: DC

## 2023-11-25 NOTE — Telephone Encounter (Signed)
Requested medication (s) are due for refill today: yes  Requested medication (s) are on the active medication list: yes  Last refill:  02/18/23 #100/12  Future visit scheduled: yes  Notes to clinic:  no protocol attaching to refill request. Please review for refill. Thanks!     Requested Prescriptions  Pending Prescriptions Disp Refills   glucose blood (ONETOUCH ULTRA TEST) test strip 100 each 12    Sig: Use as instructed     There is no refill protocol information for this order

## 2023-11-26 ENCOUNTER — Other Ambulatory Visit: Payer: Self-pay

## 2023-11-26 DIAGNOSIS — E785 Hyperlipidemia, unspecified: Secondary | ICD-10-CM

## 2023-11-26 MED ORDER — BLOOD GLUCOSE TEST VI STRP: ORAL_STRIP | 12 refills | Status: DC

## 2023-11-27 DIAGNOSIS — G4733 Obstructive sleep apnea (adult) (pediatric): Secondary | ICD-10-CM | POA: Diagnosis not present

## 2023-11-28 ENCOUNTER — Ambulatory Visit: Payer: Medicare PPO

## 2023-11-28 DIAGNOSIS — Z0000 Encounter for general adult medical examination without abnormal findings: Secondary | ICD-10-CM | POA: Diagnosis not present

## 2023-11-28 NOTE — Patient Instructions (Addendum)
Meagan [name] , Thank you for taking time to come for your Medicare Wellness Visit. I appreciate your ongoing commitment to your health goals. Please review the following plan we discussed and let me know if I can assist you in the future.   Referrals/Orders/Follow-Ups/Clinician Recommendations: NONE  This is a list of the screening recommended for you and due dates:  Health Maintenance  Topic Date Due   COVID-19 Vaccine (4 - 2024-25 season) 06/09/2023   Zoster (Shingles) Vaccine (1 of 2) 11/29/2023*   Hemoglobin A1C  02/27/2024   Yearly kidney function blood test for diabetes  04/04/2024   Yearly kidney health urinalysis for diabetes  04/04/2024   Mammogram  04/11/2024   Eye exam for diabetics  05/16/2024   Complete foot exam   08/29/2024   Medicare Annual Wellness Visit  11/27/2024   DEXA scan (bone density measurement)  03/23/2025   Colon Cancer Screening  01/04/2028   DTaP/Tdap/Td vaccine (3 - Td or Tdap) 03/26/2028   Pneumonia Vaccine  Completed   Flu Shot  Completed   Hepatitis C Screening  Completed   HPV Vaccine  Aged Out  *Topic was postponed. The date shown is not the original due date.    Advanced directives: (ACP Link)Information on Advanced Care Planning can be found at [facility] Advance Health Care Directives Advance Health Care Directives ([email or web address]   Next Medicare Annual Wellness Visit scheduled for next year: Yes   12/03/24 @ 3:10 PM BY PHONE

## 2023-11-28 NOTE — Progress Notes (Signed)
Subjective:   Meagan [name] is a 73 y.o. who presents for a Medicare Wellness preventive visit.  Visit Complete: Virtual I connected with  Meagan [name] on 11/28/23 by a audio enabled telemedicine application and verified that I am speaking with the correct person using two identifiers.  Patient Location: Home  Provider Location: Office/Clinic  I discussed the limitations of evaluation and management by telemedicine. The patient expressed understanding and agreed to proceed.  Vital Signs: Because this visit was a virtual/telehealth visit, some criteria may be missing or patient reported. Any vitals not documented were not able to be obtained and vitals that have been documented are patient reported.  VideoDeclined- This patient declined Librarian, academic. Therefore the visit was completed with audio only.  AWV Questionnaire: No: Patient Medicare AWV questionnaire was not completed prior to this visit.  Cardiac Risk Factors include: advanced age (>86men, >66 women);diabetes mellitus;dyslipidemia;hypertension;obesity (BMI >30kg/m2);microalbuminuria     Objective:    There were no vitals filed for this visit. There is no height or weight on file to calculate BMI.     11/28/2023    4:01 PM 01/04/2023    6:44 AM 11/22/2022    2:49 PM 11/21/2021    3:39 PM 12/02/2019    4:56 PM 11/10/2019    4:19 PM 07/02/2019    8:11 PM  Advanced Directives  Does Patient Have a Medical Advance Directive? No Yes Yes Yes No No Yes  Type of Special educational needs teacher of [place];Living will Healthcare Power of [employer] of [place];Living will   Healthcare Power of Attorney  Does patient want to make changes to medical advance directive?       Yes (Inpatient - patient defers changing a medical advance directive at this time - Information given)  Copy of Healthcare Power of Attorney in Chart?   No - copy requested No - copy requested     Would patient  like information on creating a medical advance directive? No - Patient declined          Current Medications (verified) Outpatient Encounter Medications as of 11/28/2023  Medication Sig   amLODipine (NORVASC) 5 MG tablet TAKE 1 TABLET (5 MG TOTAL) BY MOUTH DAILY.   atorvastatin (LIPITOR) 80 MG tablet TAKE 1 TABLET BY MOUTH DAILY AT 6 PM.   BD PEN NEEDLE NANO 2ND GEN 32G X 4 MM MISC USE AS DIRECTED   Blood Glucose Monitoring Suppl (ACCU-CHEK GUIDE ME) w/Device KIT CHECK IN THE MORNING AND AT BEDTIME. CHECK TWICE DAILY ICD-10 E10.9, E11.9   carvedilol (COREG) 3.125 MG tablet Take 1 tablet (3.125 mg total) by mouth 2 (two) times daily with a meal.   clopidogrel (PLAVIX) 75 MG tablet TAKE 1 TABLET BY MOUTH EVERY DAY   docusate sodium (COLACE) 100 MG capsule Take 1 capsule (100 mg total) by mouth daily as needed for mild constipation.   dorzolamide-timolol (COSOPT) 22.3-6.8 MG/ML ophthalmic solution SMARTSIG:In Eye(s)   Glucose Blood (BLOOD GLUCOSE TEST STRIPS) STRP CHECK IN THE MORNING AND AT BEDTIME. CHECK TWICE DAILY ICD-10 E10.9, E11.9 May substitute to any manufacturer covered by patient's insurance. Patient states Accu-Chek per insurance   glucose blood (ONETOUCH ULTRA TEST) test strip Use as instructed   insulin glargine, 2 Unit Dial, (TOUJEO MAX SOLOSTAR) 300 UNIT/ML Solostar Pen Inject 24-50 Units into the skin every morning. In place of Tresiba   levocetirizine (XYZAL) 5 MG tablet Take 5 mg by mouth daily as needed for  allergies.    metFORMIN (GLUCOPHAGE-XR) 750 MG 24 hr tablet Take 2 tablets (1,500 mg total) by mouth daily with breakfast. Double what you have at home   olmesartan-hydrochlorothiazide (BENICAR HCT) 40-25 MG tablet TAKE 1 TABLET BY MOUTH EVERY DAY   polyethylene glycol (MIRALAX / GLYCOLAX) 17 g packet Take 17 g by mouth daily.   RYBELSUS 14 MG TABS TAKE 1 TABLET (14 MG TOTAL) BY MOUTH DAILY   No facility-administered encounter medications on file as of 11/28/2023.     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   Diffuse cystic mastopathy    Fall 2013   Hypertension 2004   Occlusion and stenosis of carotid artery without mention of cerebral infarction    Sleep apnea    Stroke [facility])    Past Surgical History:  Procedure Laterality Date   APPENDECTOMY  2000s   BIOPSY THYROID  2006   [facility]   BREAST EXCISIONAL BIOPSY Left 2000s   neg   CARPAL TUNNEL RELEASE Left 12/2013   CATARACT EXTRACTION, BILATERAL Bilateral    Right 12/14/21 and Left 01/04/22   COLONOSCOPY  2011   DR.WOHL   COLONOSCOPY WITH PROPOFOL N/A 07/11/2017   Procedure: COLONOSCOPY WITH PROPOFOL;  Surgeon: Wyline Mood, MD;  Location: [facility] ENDOSCOPY;  Service: Gastroenterology;  Laterality: N/A;   COLONOSCOPY WITH PROPOFOL N/A 01/04/2023   Procedure: COLONOSCOPY WITH PROPOFOL;  Surgeon: Wyline Mood, MD;  Location[facility] ENDOSCOPY;  Service: Gastroenterology;  Laterality: N/A;   EYE SURGERY     ganglion cyst removal      KNEE SURGERY Left 08/2009   KNEE SURGERY Right 04/07/2010   REFRACTIVE SURGERY Bilateral    ROTATOR CUFF REPAIR Right 2008   TUBAL LIGATION     Family History  Problem Relation Age of Onset   Breast cancer Maternal Aunt    Hypertension Mother    Kidney Stones Mother    Hypertension Father    Congestive Heart Failure Father    Hypertension Paternal Grandfather    Diabetes Sister    Vision loss Sister    Varicose Veins Sister    Hypertension Sister    Breast cancer Sister 37       Double Mastectomy   Hypertension Sister    Social History   Socioeconomic History   Marital status: Widowed    Spouse name: Not on file   Number of children: 4   Years of education: Not on file   Highest education level:  Master's degree (e.g., MA, MS, MEng, MEd, MSW, MBA)  Occupational History   Occupation: Primary school teacher   Tobacco Use   Smoking status: Never   Smokeless tobacco: Never  Vaping Use   Vaping status: Never Used  Substance and Sexual Activity   Alcohol use: No   Drug use: No   Sexual activity: Not Currently    Partners: Male    Comment: Widow for 14 years  Other Topics Concern   Not on file  Social History Narrative   She had a stroke on 06/2019, she has left peripheral vision loss since stroke   She moved in with her youngest sister    Social Drivers of Corporate investment banker Strain: Low Risk  (11/28/2023)   Overall Financial Resource Strain (CARDIA)    Difficulty  of Paying Living Expenses: Not hard at all  Food Insecurity: No Food Insecurity (11/28/2023)   Hunger Vital Sign    Worried About Running Out of Food in the Last Year: Never true    Ran Out of Food in the Last Year: Never true  Transportation Needs: No Transportation Needs (11/28/2023)   PRAPARE - Administrator, Civil Service (Medical): No    Lack of Transportation (Non-Medical): No  Physical Activity: Sufficiently Active (11/28/2023)   Exercise Vital Sign    Days of Exercise per Week: 7 days    Minutes of Exercise per Session: 30 min  Stress: No Stress Concern Present (11/28/2023)   [employer] of Occupational Health - Occupational Stress Questionnaire    Feeling of Stress : Not at all  Social Connections: Moderately Integrated (11/28/2023)   Social Connection and Isolation Panel [NHANES]    Frequency of Communication with Friends and Family: More than three times a week    Frequency of Social Gatherings with Friends and Family: More than three times a week    Attends Religious Services: More than 4 times per year    Active Member of [employer] or Organizations: Yes    Attends Banker Meetings: More than 4 times per year    Marital Status: Widowed    Tobacco Counseling Counseling  given: Not Answered    Clinical Intake:  Pre-visit preparation completed: Yes  Pain : No/denies pain     BMI - recorded: 34.2 Nutritional Status: BMI > 30  Obese Nutritional Risks: None Diabetes: Yes CBG done?: No Did pt. bring in CBG monitor from home?: No  How often do you need to have someone help you when you read instructions, pamphlets, or other written materials from your doctor or pharmacy?: 1 - Never  Interpreter Needed?: No  Information entered by :: Kennedy Bucker, LPN   Activities of Daily Living     11/28/2023    4:03 PM 08/30/2023    1:13 PM  In your present state of health, do you have any difficulty performing the following activities:  Hearing? 0 0  Vision? 1 0  Comment SINCE STROKE   Difficulty concentrating or making decisions? 0 0  Walking or climbing stairs? 0 1  Dressing or bathing? 0 0  Doing errands, shopping? 0 1  Preparing Food and eating ? N   Using the Toilet? N   In the past six months, have you accidently leaked urine? N   Do you have problems with loss of bowel control? N   Managing your Medications? Y   Comment SISTER HANDLES   Managing your Finances? N   Housekeeping or managing your Housekeeping? Y     Patient Care Team: Alba Cory, MD as PCP - General Debbe Odea, MD as PCP - Cardiology (Cardiology) Lucille Passy, MD as Referring Physician (Ophthalmology) Suanne Marker, MD as Consulting Physician (Neurology) Domingo Madeira, OD (Optometry)  Indicate any recent Medical Services you may have received from other than [facility] providers in the past year (date may be approximate).     Assessment:   This is a routine wellness examination for Chaz.  Hearing/Vision screen Hearing Screening - Comments:: NO AIDS Vision Screening - Comments:: WEARS GLASSES- DR.KEITH NICE   Goals Addressed             This Visit's Progress    DIET - EAT MORE FRUITS AND VEGETABLES         Depression Screen  11/28/2023     3:59 PM 08/30/2023    1:13 PM 04/05/2023    9:02 AM 11/28/2022    2:34 PM 11/22/2022    2:44 PM 08/16/2022    1:44 PM 07/04/2022    2:54 PM  PHQ 2/9 Scores  PHQ - 2 Score 0 0 0 0 0 0 0  PHQ- 9 Score 0 0 0 0   0    Fall Risk     11/28/2023    4:03 PM 08/30/2023    1:13 PM 04/05/2023    9:02 AM 11/28/2022    2:34 PM 11/22/2022    2:40 PM  Fall Risk   Falls in the past year? 0 0 0 0 0  Number falls in past yr: 0 0 0  0  Injury with Fall? 0 0 0  0  Risk for fall due to : No Fall Risks No Fall Risks No Fall Risks No Fall Risks No Fall Risks  Follow up Falls prevention discussed;Falls evaluation completed Falls prevention discussed Falls prevention discussed Falls prevention discussed Education provided;Falls prevention discussed    MEDICARE RISK AT HOME:  Medicare Risk at Home Any stairs in or around the home?: Yes If so, are there any without handrails?: No Home free of loose throw rugs in walkways, pet beds, electrical cords, etc?: Yes Adequate lighting in your home to reduce risk of falls?: Yes Life alert?: No Use of a cane, walker or w/c?: Yes (CANE IF NEEDED) Grab bars in the bathroom?: Yes Shower chair or bench in shower?: Yes Elevated toilet seat or a handicapped toilet?: Yes  TIMED UP AND GO:  Was the test performed?  No  Cognitive Function: 6CIT completed        11/28/2023    4:05 PM 11/22/2022    2:54 PM  6CIT Screen  What Year? 0 points 0 points  What month? 0 points 0 points  What time? 0 points 0 points  Count back from 20 0 points 0 points  Months in reverse 0 points 0 points  Repeat phrase 0 points 2 points  Total Score 0 points 2 points    Immunizations Immunization History  Administered Date(s) Administered   Fluad Quad(high Dose 65+) 07/24/2019, 07/01/2020, 07/04/2022   Fluad Trivalent(High Dose 65+) 08/30/2023   Influenza, High Dose Seasonal PF 07/05/2021   PFIZER(Purple Top)SARS-COV-2 Vaccination 12/06/2019, 12/29/2019, 09/14/2020    Pneumococcal Conjugate-13 10/22/2018   Pneumococcal Polysaccharide-23 10/08/2006, 03/01/2020   Tdap 10/09/2007, 03/26/2018    Screening Tests Health Maintenance  Topic Date Due   COVID-19 Vaccine (4 - 2024-25 season) 06/09/2023   Zoster Vaccines- Shingrix (1 of 2) 11/29/2023 (Originally 05/22/1970)   HEMOGLOBIN A1C  02/27/2024   Diabetic kidney evaluation - eGFR measurement  04/04/2024   Diabetic kidney evaluation - Urine ACR  04/04/2024   MAMMOGRAM  04/11/2024   OPHTHALMOLOGY EXAM  05/16/2024   FOOT EXAM  08/29/2024   Medicare Annual Wellness (AWV)  11/27/2024   DEXA SCAN  03/23/2025   Colonoscopy  01/04/2028   DTaP/Tdap/Td (3 - Td or Tdap) 03/26/2028   Pneumonia Vaccine 6+ Years old  Completed   INFLUENZA VACCINE  Completed   Hepatitis C Screening  Completed   HPV VACCINES  Aged Out    Health Maintenance  Health Maintenance Due  Topic Date Due   COVID-19 Vaccine (4 - 2024-25 season) 06/09/2023   Health Maintenance Items Addressed: UP TO DATE  Additional Screening:  Vision Screening: Recommended annual ophthalmology exams for early detection  of glaucoma and other disorders of the eye.  Dental Screening: Recommended annual dental exams for proper oral hygiene  Community Resource Referral / Chronic Care Management: CRR required this visit?  No   CCM required this visit?  No     Plan:     I have personally reviewed and noted the following in the patient's chart:   Medical and social history Use of alcohol, tobacco or illicit drugs  Current medications and supplements including opioid prescriptions. Patient is not currently taking opioid prescriptions. Functional ability and status Nutritional status Physical activity Advanced directives List of other physicians Hospitalizations, surgeries, and ER visits in previous 12 months Vitals Screenings to include cognitive, depression, and falls Referrals and appointments  In addition, I have reviewed and  discussed with patient certain preventive protocols, quality metrics, and best practice recommendations. A written personalized care plan for preventive services as well as general preventive health recommendations were provided to patient.     Hal Hope, LPN   2/44/0102   After Visit Summary: (MyChart) Due to this being a telephonic visit, the after visit summary with patients personalized plan was offered to patient via MyChart   Notes: Nothing significant to report at this time.

## 2023-12-13 LAB — HM DIABETES EYE EXAM

## 2023-12-15 ENCOUNTER — Other Ambulatory Visit: Payer: Self-pay | Admitting: Family Medicine

## 2023-12-15 DIAGNOSIS — I69359 Hemiplegia and hemiparesis following cerebral infarction affecting unspecified side: Secondary | ICD-10-CM

## 2023-12-15 DIAGNOSIS — I10 Essential (primary) hypertension: Secondary | ICD-10-CM

## 2023-12-15 DIAGNOSIS — E1129 Type 2 diabetes mellitus with other diabetic kidney complication: Secondary | ICD-10-CM

## 2023-12-16 NOTE — Telephone Encounter (Signed)
 Patient seems to come by every 4 months but scheduled out to next month.

## 2024-01-17 ENCOUNTER — Ambulatory Visit: Payer: Medicare PPO | Admitting: Family Medicine

## 2024-01-19 ENCOUNTER — Other Ambulatory Visit: Payer: Self-pay | Admitting: Family Medicine

## 2024-01-19 DIAGNOSIS — E1169 Type 2 diabetes mellitus with other specified complication: Secondary | ICD-10-CM

## 2024-01-20 ENCOUNTER — Ambulatory Visit: Admitting: Internal Medicine

## 2024-01-20 ENCOUNTER — Encounter: Payer: Self-pay | Admitting: Internal Medicine

## 2024-01-20 VITALS — BP 132/76 | HR 90 | Resp 16 | Ht 64.0 in | Wt 195.1 lb

## 2024-01-20 DIAGNOSIS — I152 Hypertension secondary to endocrine disorders: Secondary | ICD-10-CM

## 2024-01-20 DIAGNOSIS — K5909 Other constipation: Secondary | ICD-10-CM | POA: Diagnosis not present

## 2024-01-20 DIAGNOSIS — E1159 Type 2 diabetes mellitus with other circulatory complications: Secondary | ICD-10-CM | POA: Diagnosis not present

## 2024-01-20 DIAGNOSIS — E785 Hyperlipidemia, unspecified: Secondary | ICD-10-CM | POA: Diagnosis not present

## 2024-01-20 DIAGNOSIS — I69359 Hemiplegia and hemiparesis following cerebral infarction affecting unspecified side: Secondary | ICD-10-CM | POA: Diagnosis not present

## 2024-01-20 DIAGNOSIS — E1169 Type 2 diabetes mellitus with other specified complication: Secondary | ICD-10-CM

## 2024-01-20 DIAGNOSIS — G4733 Obstructive sleep apnea (adult) (pediatric): Secondary | ICD-10-CM

## 2024-01-20 DIAGNOSIS — J302 Other seasonal allergic rhinitis: Secondary | ICD-10-CM

## 2024-01-20 DIAGNOSIS — N1831 Chronic kidney disease, stage 3a: Secondary | ICD-10-CM

## 2024-01-20 LAB — POCT GLYCOSYLATED HEMOGLOBIN (HGB A1C): Hemoglobin A1C: 6.7 % — AB (ref 4.0–5.6)

## 2024-01-20 MED ORDER — BLOOD GLUCOSE TEST VI STRP: ORAL_STRIP | 12 refills | Status: AC

## 2024-01-20 MED ORDER — CARVEDILOL 3.125 MG PO TABS: 3.1250 mg | ORAL_TABLET | Freq: Two times a day (BID) | ORAL | 1 refills | Status: DC

## 2024-01-20 MED ORDER — ATORVASTATIN CALCIUM 80 MG PO TABS: ORAL_TABLET | ORAL | 1 refills | Status: DC

## 2024-01-20 MED ORDER — DOCUSATE SODIUM 100 MG PO CAPS: 100.0000 mg | ORAL_CAPSULE | Freq: Every day | ORAL | 0 refills | Status: DC | PRN

## 2024-01-20 MED ORDER — FLUTICASONE PROPIONATE 50 MCG/ACT NA SUSP: 2.0000 | Freq: Every day | NASAL | 6 refills | Status: AC

## 2024-01-20 MED ORDER — LEVOCETIRIZINE DIHYDROCHLORIDE 5 MG PO TABS: 5.0000 mg | ORAL_TABLET | Freq: Every day | ORAL | 0 refills | Status: DC | PRN

## 2024-01-20 NOTE — Telephone Encounter (Signed)
 Pt seen you today for a OV can you fill out handicap form or should we wait on PCP to come back?

## 2024-01-20 NOTE — Progress Notes (Signed)
 Established Patient Office Visit  Subjective   Patient ID: Meagan [name], female    DOB: September 19, 1951  Age: 73 y.o. MRN: [id number]  Chief Complaint  Patient presents with   Medical Management of Chronic Issues    HPI  Patient is here to follow up on chronic medical conditions. She is here with her sister Nellie. This is my first time meeting her.   Discussed the use of AI scribe software for clinical note transcription with the patient, who gave verbal consent to proceed.  History of Present Illness The patient, with a history of diabetes, chronic kidney disease, stroke, and eye problems, presents for a routine check-up. The patient reports no significant changes in health since the last visit. The patient's diabetes is well-managed, with a decrease in A1c from 6.9 to 6.7 since the last visit. The patient has been gradually reducing the dose of Toujeo insulin, currently at 12 units, down from 16 units at the last visit. The patient's chronic kidney disease is stable, and the patient is on medication to protect the kidneys (Benicar). The patient has not seen a nephrologist recently due to the retirement of the previous doctor but will call to schedule. The patient's blood pressure is well-controlled. The patient had a stroke in the past, which has affected her vision. She is under the care of a retina specialist and a primary eye doctor. She is on eye drops for her eye condition and has procedures every five to six weeks. The patient also uses a CPAP machine for sleep apnea and sees a specialist annually. The patient reports no new symptoms or changes in her chronic conditions.    Patient Active Problem List   Diagnosis Date Noted   History of colonic polyps 01/04/2023   Adenomatous polyp of colon 01/04/2023   Anemia in chronic kidney disease 12/21/2021   Chronic kidney disease, stage 3a (HCC) 12/21/2021   History of CVA with residual deficit 07/01/2019   Dyslipidemia associated with type  2 diabetes mellitus (HCC) 12/04/2016   Controlled type 2 diabetes mellitus with microalbuminuria (HCC) 12/04/2016   Seasonal allergies 02/01/2016   OSA (obstructive sleep apnea) 10/06/2015   Hypertensive retinopathy 10/06/2015   Thyroid cyst 10/06/2015   Dyslipidemia 10/06/2015   Controlled type 2 diabetes with neuropathy (HCC) 10/06/2015   History of pneumonia 10/06/2015   Chronic neck pain 10/06/2015   Bilateral carotid artery disease, unspecified type (HCC)    Hypertension, benign    Past Medical History:  Diagnosis Date   Cellulitis    left leg   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   Diffuse cystic mastopathy    Fall 2013   Hypertension 2004   Occlusion and stenosis of carotid artery without mention of cerebral infarction    Sleep apnea    Stroke [facility])    Past Surgical History:  Procedure Laterality Date   APPENDECTOMY  2000s   BIOPSY THYROID  2006   [facility]   BREAST EXCISIONAL BIOPSY Left 2000s   neg   CARPAL TUNNEL RELEASE Left 12/2013   CATARACT EXTRACTION, BILATERAL Bilateral    Right 12/14/21 and Left 01/04/22   COLONOSCOPY  2011   DR.WOHL   COLONOSCOPY WITH PROPOFOL N/A 07/11/2017   Procedure: COLONOSCOPY WITH PROPOFOL;  Surgeon: Wyline Mood, MD;  Location: [facility] ENDOSCOPY;  Service: Gastroenterology;  Laterality: N/A;   COLONOSCOPY WITH PROPOFOL N/A 01/04/2023   Procedure: COLONOSCOPY WITH PROPOFOL;  Surgeon: Wyline Mood, MD;  Location: [facility] ENDOSCOPY;  Service: Gastroenterology;  Laterality: N/A;   EYE SURGERY     ganglion cyst removal      KNEE SURGERY Left 08/2009   KNEE SURGERY Right 04/07/2010   REFRACTIVE SURGERY Bilateral    ROTATOR CUFF REPAIR Right 2008   TUBAL LIGATION     Social History   Tobacco Use   Smoking status: Never   Smokeless tobacco: Never  Vaping Use   Vaping status: Never Used  Substance Use Topics   Alcohol use: No   Drug use: No   Social History   Socioeconomic History   Marital status:  Widowed    Spouse name: Not on file   Number of children: 4   Years of education: Not on file   Highest education level: Master's degree (e.g., MA, MS, MEng, MEd, MSW, MBA)  Occupational History   Occupation: Primary school teacher   Tobacco Use   Smoking status: Never   Smokeless tobacco: Never  Vaping Use   Vaping status: Never Used  Substance and Sexual Activity   Alcohol use: No   Drug use: No   Sexual activity: Not Currently    Partners: Male    Comment: Widow for 14 years  Other Topics Concern   Not on file  Social History Narrative   She had a stroke on 06/2019, she has left peripheral vision loss since stroke   She moved in with her youngest sister    Social Drivers of Corporate investment banker Strain: Low Risk  (11/28/2023)   Overall Financial Resource Strain (CARDIA)    Difficulty of Paying Living Expenses: Not hard at all  Food Insecurity: No Food Insecurity (11/28/2023)   Hunger Vital Sign    Worried About Running Out of Food in the Last Year: Never true    Ran Out of Food in the Last Year: Never true  Transportation Needs: No Transportation Needs (11/28/2023)   PRAPARE - Administrator, Civil Service (Medical): No    Lack of Transportation (Non-Medical): No  Physical Activity: Sufficiently Active (11/28/2023)   Exercise Vital Sign    Days of Exercise per Week: 7 days    Minutes of Exercise per Session: 30 min  Stress: No Stress Concern Present (11/28/2023)   [employer] of Occupational Health - Occupational Stress Questionnaire    Feeling of Stress : Not at all  Social Connections: Moderately Integrated (11/28/2023)   Social Connection and Isolation Panel [NHANES]    Frequency of Communication with Friends and Family: More than three times a week    Frequency of Social Gatherings with Friends and Family: More than three times a week    Attends Religious Services: More than 4 times per year    Active Member of [employer] or Organizations: Yes    Attends  Banker Meetings: More than 4 times per year    Marital Status: Widowed  Intimate Partner Violence: Not At Risk (11/28/2023)   Humiliation, Afraid, Rape, and Kick questionnaire    Fear of Current or Ex-Partner: No    Emotionally Abused: No    Physically Abused: No    Sexually Abused: No   Family Status  Relation Name Status   Sister Patsy Alive   Mat Aunt  Deceased   Mother  Deceased at age 46   Father  Deceased   MGM  Deceased at age 57   MGF  Deceased   PGM  Deceased at age 62   PGF  Deceased   Sister  Maxine Alive   Sister [place] Alive   Sister [place] Alive   Sister [name] Alive   Sister Nellie Alive   Sister [place] Alive   Daughter Lucky Alive   Son [place] Alive   Son Melodie Spry Alive   Daughter Nellie Banas Alive  No partnership data on file   Family History  Problem Relation Age of Onset   Breast cancer Maternal Aunt    Hypertension Mother    Kidney Stones Mother    Hypertension Father    Congestive Heart Failure Father    Hypertension Paternal Grandfather    Diabetes Sister    Vision loss Sister    Varicose Veins Sister    Hypertension Sister    Breast cancer Sister 77       Double Mastectomy   Hypertension Sister    Allergies  Allergen Reactions   Jardiance [Empagliflozin] Rash   Amoxicillin     rash   Bee Venom Swelling and Other (See Comments)    Immediately takes Benadryl to counteract   Trulicity [Dulaglutide] Hives   Farxiga [Dapagliflozin] Rash    Pt reports that she developed a rash when she took Farxiga and she would like it to be added as an allergy   Keflex [Cephalexin] Rash      Review of Systems  All other systems reviewed and are negative.     Objective:     BP 132/76   Pulse 90   Resp 16   Ht 5\' 4"  (1.626 m)   Wt 195 lb 1.6 oz (88.5 kg)   SpO2 94%   BMI 33.49 kg/m  BP Readings from Last 3 Encounters:  01/20/24 132/76  08/30/23 138/70  08/19/23 135/70   Wt Readings from Last 3 Encounters:  01/20/24 195  lb 1.6 oz (88.5 kg)  08/30/23 199 lb (90.3 kg)  08/19/23 200 lb 6.4 oz (90.9 kg)      Physical Exam Constitutional:      Appearance: Normal appearance.  HENT:     Head: Normocephalic and atraumatic.  Eyes:     Conjunctiva/sclera: Conjunctivae normal.  Cardiovascular:     Rate and Rhythm: Normal rate and regular rhythm.  Pulmonary:     Effort: Pulmonary effort is normal.     Breath sounds: Normal breath sounds.  Skin:    General: Skin is warm and dry.  Neurological:     General: No focal deficit present.     Mental Status: She is alert. Mental status is at baseline.  Psychiatric:        Mood and Affect: Mood normal.        Behavior: Behavior normal.      Results for orders placed or performed in visit on 01/20/24  POCT glycosylated hemoglobin (Hb A1C)  Result Value Ref Range   Hemoglobin A1C 6.7 (A) 4.0 - 5.6 %   HbA1c POC (<> result, manual entry)     HbA1c, POC (prediabetic range)     HbA1c, POC (controlled diabetic range)      Last CBC Lab Results  Component Value Date   WBC 8.7 04/05/2023   HGB 10.9 (L) 04/05/2023   HCT 32.1 (L) 04/05/2023   MCV 87.9 04/05/2023   MCH 29.9 04/05/2023   RDW 13.3 04/05/2023   PLT 344 04/05/2023   Last metabolic panel Lab Results  Component Value Date   GLUCOSE 99 04/05/2023   NA 144 04/05/2023   K 3.4 (L) 04/05/2023   CL 104 04/05/2023   CO2 28 04/05/2023  BUN 11 04/05/2023   CREATININE 0.95 04/05/2023   EGFR 65 04/05/2023   CALCIUM 9.5 04/05/2023   PROT 6.9 04/05/2023   ALBUMIN 3.8 12/02/2019   LABGLOB 3.2 01/31/2016   AGRATIO 1.3 01/31/2016   BILITOT 0.4 04/05/2023   ALKPHOS 86 12/02/2019   AST 21 04/05/2023   ALT 17 04/05/2023   ANIONGAP 9 12/02/2019   Last lipids Lab Results  Component Value Date   CHOL 95 04/05/2023   HDL 33 (L) 04/05/2023   LDLCALC 44 04/05/2023   TRIG 95 04/05/2023   CHOLHDL 2.9 04/05/2023   Last hemoglobin A1c Lab Results  Component Value Date   HGBA1C 6.7 (A) 01/20/2024    Last thyroid functions Lab Results  Component Value Date   TSH 0.92 11/06/2016   Last vitamin D Lab Results  Component Value Date   VD25OH 46 04/05/2023   Last vitamin B12 and Folate Lab Results  Component Value Date   VITAMINB12 701 07/04/2022      The ASCVD Risk score (Arnett DK, et al., 2019) failed to calculate for the following reasons:   Risk score cannot be calculated because patient has a medical history suggesting prior/existing ASCVD    Assessment & Plan:   Assessment & Plan Type 2 Diabetes Mellitus Diabetes well-controlled with A1c of 6.7. Continue  Toujeo 12 units and is continuing to reduce based on her low glucose readings. Aiming to reduce insulin dependency. - Continue metformin, Rybelsus, and Toujeo as prescribed. - Monitor blood glucose levels regularly. - Refill Accu-Check strips and remove OneTouch from prescription list.  Chronic Kidney Disease On a ARB. Plans to follow up with nephrologist. - Continue current kidney protective medication. - Follow up with a nephrologist for ongoing kidney care.  Hypertension Blood pressure well-controlled at 132/76 mmHg on amlodipine, Olmesartan-hydrochlorothiazide and carvedilol. - Refills ordered.  Hx of Stroke with Visual Impairment Vision affected by stroke. Ongoing eye procedures with retina specialist showing progress. - Continue regular follow-ups with retina specialist. - Continue prescribed eye drops including Flarex. - Attend scheduled eye procedure.  Hyperlipidemia On Lipitor for cholesterol management. - Continue Lipitor as prescribed. - Refill Lipitor prescription.  Obstructive Sleep Apnea Uses CPAP machine effectively. Annual follow-up with sleep specialist. - Continue using CPAP machine as prescribed. - Follow up with sleep specialist in November 2025.  Allergies Sinus drainage and congestion likely due to pollen. Uses Xyzal and Flonase. - Refill Xyzal and advise taking at night if  drowsiness occurs. - Prescribe Flonase for sinus congestion.  Constipation Uses Colace as preferred stool softener. - Refill Colace prescription.   Return in about 4 months (around 05/21/2024).    Rockney Cid, DO

## 2024-01-20 NOTE — Telephone Encounter (Signed)
 Copied from CRM [id number]. Topic: General - Other >> Jan 20, 2024  3:41 PM Lizabeth Riggs wrote: Reason for CRM:  Meagan [name] just left office and she forgot to pick up a copy of the visit today. Also, she needs an order to renew her handicap sticker. She will come back tomorrow after 1 PM to pick up paperwork and order. Please call her if this is a problem. Thanks

## 2024-01-23 ENCOUNTER — Telehealth: Payer: Self-pay

## 2024-01-23 NOTE — Telephone Encounter (Signed)
 Pt would like a call back once her handicap placard form is ready when dr Ava Lei gets back.

## 2024-01-24 NOTE — Telephone Encounter (Signed)
 In PCP folder to sign it.

## 2024-02-25 ENCOUNTER — Encounter (INDEPENDENT_AMBULATORY_CARE_PROVIDER_SITE_OTHER): Payer: Self-pay

## 2024-03-12 ENCOUNTER — Other Ambulatory Visit: Payer: Self-pay | Admitting: Family Medicine

## 2024-03-12 DIAGNOSIS — R809 Proteinuria, unspecified: Secondary | ICD-10-CM

## 2024-03-12 DIAGNOSIS — I69359 Hemiplegia and hemiparesis following cerebral infarction affecting unspecified side: Secondary | ICD-10-CM

## 2024-03-12 DIAGNOSIS — I10 Essential (primary) hypertension: Secondary | ICD-10-CM

## 2024-03-17 ENCOUNTER — Other Ambulatory Visit: Payer: Self-pay | Admitting: Internal Medicine

## 2024-03-17 DIAGNOSIS — K5909 Other constipation: Secondary | ICD-10-CM

## 2024-03-17 NOTE — Telephone Encounter (Signed)
 Requested Prescriptions  Pending Prescriptions Disp Refills   docusate sodium  (COLACE) 100 MG capsule [Pharmacy Med Name: DOCUSATE SODIUM  100 MG SOFTGEL] 60 capsule 0    Sig: TAKE 1 CAPSULE (100 MG TOTAL) BY MOUTH DAILY AS NEEDED FOR MILD CONSTIPATION.     Over the Counter:  OTC Passed - 03/17/2024  5:37 PM      Passed - Valid encounter within last 12 months    Recent Outpatient Visits           1 month ago Dyslipidemia associated with type 2 diabetes mellitus [facility])   [facility] Rockney Cid, [place]

## 2024-03-31 ENCOUNTER — Other Ambulatory Visit: Payer: Self-pay | Admitting: Family Medicine

## 2024-03-31 DIAGNOSIS — Z1231 Encounter for screening mammogram for malignant neoplasm of breast: Secondary | ICD-10-CM

## 2024-04-16 ENCOUNTER — Other Ambulatory Visit: Payer: Self-pay | Admitting: Internal Medicine

## 2024-04-16 DIAGNOSIS — J302 Other seasonal allergic rhinitis: Secondary | ICD-10-CM

## 2024-04-17 NOTE — Telephone Encounter (Signed)
 Requested Prescriptions  Pending Prescriptions Disp Refills   levocetirizine (XYZAL ) 5 MG tablet [Pharmacy Med Name: LEVOCETIRIZINE 5 MG TABLET] 90 tablet 0    Sig: TAKE 1 TABLET BY MOUTH DAILY AS NEEDED FOR ALLERGIES.     Ear, Nose, and Throat:  Antihistamines - levocetirizine dihydrochloride  Failed - 04/17/2024  3:53 PM      Failed - Cr in normal range and within 360 days    Creat  Date Value Ref Range Status  04/05/2023 0.95 0.50 - 1.05 mg/dL Final   Creatinine, Urine  Date Value Ref Range Status  04/05/2023 40 20 - 275 mg/dL Final         Failed - eGFR is 10 or above and within 360 days    GFR, Est African American  Date Value Ref Range Status  03/07/2021 45 (L) > OR = 60 mL/min/1.55m2 Final   GFR, Est Non African American  Date Value Ref Range Status  03/07/2021 38 (L) > OR = 60 mL/min/1.40m2 Final   eGFR  Date Value Ref Range Status  04/05/2023 65 > OR = 60 mL/min/1.78m2 Final         Passed - Valid encounter within last 12 months    Recent Outpatient Visits           2 months ago Dyslipidemia associated with type 2 diabetes mellitus [facility])   [facility] Bernardo Fend, [place]

## 2024-04-24 ENCOUNTER — Ambulatory Visit
Admission: RE | Admit: 2024-04-24 | Discharge: 2024-04-24 | Disposition: A | Discharge status: Home / Self Care | Source: Ambulatory Visit | Attending: Family Medicine | Admitting: Family Medicine

## 2024-04-24 DIAGNOSIS — Z1231 Encounter for screening mammogram for malignant neoplasm of breast: Secondary | ICD-10-CM | POA: Diagnosis present

## 2024-04-30 ENCOUNTER — Ambulatory Visit (INDEPENDENT_AMBULATORY_CARE_PROVIDER_SITE_OTHER): Payer: Medicare PPO | Admitting: Vascular Surgery

## 2024-04-30 ENCOUNTER — Encounter (INDEPENDENT_AMBULATORY_CARE_PROVIDER_SITE_OTHER): Payer: Medicare PPO

## 2024-05-15 ENCOUNTER — Other Ambulatory Visit: Payer: Self-pay | Admitting: Family Medicine

## 2024-05-15 DIAGNOSIS — E1129 Type 2 diabetes mellitus with other diabetic kidney complication: Secondary | ICD-10-CM

## 2024-05-19 ENCOUNTER — Other Ambulatory Visit (INDEPENDENT_AMBULATORY_CARE_PROVIDER_SITE_OTHER): Payer: Self-pay | Admitting: Nurse Practitioner

## 2024-05-19 DIAGNOSIS — I6523 Occlusion and stenosis of bilateral carotid arteries: Secondary | ICD-10-CM

## 2024-05-19 NOTE — Progress Notes (Unsigned)
 MRN : [id number]  Meagan [name] is a 73 y.o. (November 03, 1950) female who presents with chief complaint of check carotid arteries.  History of Present Illness:   The patient is seen for evaluation of carotid stenosis. The carotid stenosis was identified after she experienced a stroke. The CVA was posterior. Nevertheless, work-up included a CT angiogram which has demonstrated less than 50% bilateral internal carotid artery stenosis. There is a 60% stenosis of the right external carotid artery. At the time of her CVA the patient was in a hypertensive crisis and complaining of an occipital headache   The patient denies amaurosis fugax. There is no recent history of TIA symptoms or focal motor deficits. There is no prior documented CVA.   She denies further severe headaches since her stroke. There is no history of seizures.   The patient is taking enteric-coated aspirin  81 mg daily.   The patient has a history of coronary artery disease, no recent episodes of angina or shortness of breath. The patient denies PAD or claudication symptoms. There is a history of hyperlipidemia which is being treated with a statin.    Duplex Ultrasound of the RICA 40-59% and the LICA <40%, no change from study 03/28/2020  No outpatient medications have been marked as taking for the 05/21/24 encounter (Appointment) with Jama, Cordella MATSU, MD.    Past Medical History:  Diagnosis Date   Cellulitis    left leg   Diabetes mellitus without complication (HCC)    non-insulin  dependent   Diffuse cystic mastopathy    Fall 2013   Hypertension 2004   Occlusion and stenosis of carotid artery without mention of cerebral infarction    Sleep apnea    Stroke [facility])     Past Surgical History:  Procedure Laterality Date   APPENDECTOMY  2000s   BIOPSY THYROID   2006   [facility]   BREAST EXCISIONAL BIOPSY Left 2000s   neg   CARPAL TUNNEL RELEASE Left 12/2013   CATARACT EXTRACTION, BILATERAL Bilateral     Right 12/14/21 and Left 01/04/22   COLONOSCOPY  2011   DR.WOHL   COLONOSCOPY WITH PROPOFOL  N/A 07/11/2017   Procedure: COLONOSCOPY WITH PROPOFOL ;  Surgeon: Therisa Bi, MD;  Location: [facility] ENDOSCOPY;  Service: Gastroenterology;  Laterality: N/A;   COLONOSCOPY WITH PROPOFOL  N/A 01/04/2023   Procedure: COLONOSCOPY WITH PROPOFOL ;  Surgeon: Therisa Bi, MD;  Location: [facility] ENDOSCOPY;  Service: Gastroenterology;  Laterality: N/A;   EYE SURGERY     ganglion cyst removal      KNEE SURGERY Left 08/2009   KNEE SURGERY Right 04/07/2010   REFRACTIVE SURGERY Bilateral    ROTATOR CUFF REPAIR Right 2008   TUBAL LIGATION      Social History Social History   Tobacco Use   Smoking status: Never   Smokeless tobacco: Never  Vaping Use   Vaping status: Never Used  Substance Use Topics   Alcohol  use: No   Drug use: No    Family History Family History  Problem Relation Age of Onset   Breast cancer Maternal Aunt    Hypertension Mother    Kidney Stones Mother    Hypertension Father    Congestive Heart Failure Father    Hypertension Paternal Grandfather    Diabetes Sister    Vision loss Sister    Varicose Veins Sister    Hypertension Sister  Breast cancer Sister 76       Double Mastectomy   Hypertension Sister     Allergies  Allergen Reactions   Jardiance  [Empagliflozin ] Rash   Amoxicillin     rash   Bee Venom Swelling and Other (See Comments)    Immediately takes Benadryl  to counteract   Trulicity  [Dulaglutide ] Hives   Farxiga  [Dapagliflozin ] Rash    Pt reports that she developed a rash when she took Farxiga  and she would like it to be added as an allergy   Keflex [Cephalexin] Rash     REVIEW OF SYSTEMS (Negative unless checked)  Constitutional: [] Weight loss  [] Fever  [] Chills Cardiac: [] Chest pain   [] Chest pressure   [] Palpitations   [] Shortness of breath when laying flat   [] Shortness of breath with exertion. Vascular:  [x] Pain in legs with walking   [] Pain in legs at rest   [] History of DVT   [] Phlebitis   [] Swelling in legs   [] Varicose veins   [] Non-healing ulcers Pulmonary:   [] Uses home oxygen   [] Productive cough   [] Hemoptysis   [] Wheeze  [] COPD   [] Asthma Neurologic:  [] Dizziness   [] Seizures   [] History of stroke   [] History of TIA  [] Aphasia   [] Vissual changes   [] Weakness or numbness in arm   [] Weakness or numbness in leg Musculoskeletal:   [] Joint swelling   [] Joint pain   [] Low back pain Hematologic:  [] Easy bruising  [] Easy bleeding   [] Hypercoagulable state   [] Anemic Gastrointestinal:  [] Diarrhea   [] Vomiting  [] Gastroesophageal reflux/heartburn   [] Difficulty swallowing. Genitourinary:  [] Chronic kidney disease   [] Difficult urination  [] Frequent urination   [] Blood in urine Skin:  [] Rashes   [] Ulcers  Psychological:  [] History of anxiety   []  History of major depression.  Physical Examination  There were no vitals filed for this visit. There is no height or weight on file to calculate BMI. Gen: WD/WN, NAD Head: [place]/AT, No temporalis wasting.  Ear/Nose/Throat: Hearing grossly intact, nares w/o erythema or drainage Eyes: PER, EOMI, sclera nonicteric.  Neck: Supple, no masses.  No bruit or JVD.  Pulmonary:  Good air movement, no audible wheezing, no use of accessory muscles.  Cardiac: RRR, normal S1, S2, no Murmurs. Vascular:  carotid bruit noted Vessel Right Left  Radial Palpable Palpable  Carotid  Palpable  Palpable  Subclav  Palpable Palpable  Gastrointestinal: soft, non-distended. No guarding/no peritoneal signs.  Musculoskeletal: M/S 5/5 throughout.  No visible deformity.  Neurologic: CN 2-12 intact. Pain and light touch intact in extremities.  Symmetrical.  Speech is fluent. Motor exam as listed above. Psychiatric: Judgment intact, Mood & affect appropriate for pt's clinical situation. Dermatologic: No rashes or ulcers noted.  No changes consistent with cellulitis.   CBC Lab Results  Component Value Date   WBC 8.7 04/05/2023    HGB 10.9 (L) 04/05/2023   HCT 32.1 (L) 04/05/2023   MCV 87.9 04/05/2023   PLT 344 04/05/2023    BMET    Component Value Date/Time   NA 144 04/05/2023 0931   NA 138 01/31/2016 1115   K 3.4 (L) 04/05/2023 0931   CL 104 04/05/2023 0931   CO2 28 04/05/2023 0931   GLUCOSE 99 04/05/2023 0931   BUN 11 04/05/2023 0931   BUN 10 01/31/2016 1115   CREATININE 0.95 04/05/2023 0931   CALCIUM  9.5 04/05/2023 0931   GFRNONAA 38 (L) 03/07/2021 1538   GFRAA 45 (L) 03/07/2021 1538   CrCl cannot be calculated (Patient's most  recent lab result is older than the maximum 21 days allowed.).  COAG Lab Results  Component Value Date   INR 1.1 04/21/2009    Radiology MM 3D SCREENING MAMMOGRAM BILATERAL BREAST Result Date: 04/28/2024 CLINICAL DATA:  Screening. EXAM: DIGITAL SCREENING BILATERAL MAMMOGRAM WITH TOMOSYNTHESIS AND CAD TECHNIQUE: Bilateral screening digital craniocaudal and mediolateral oblique mammograms were obtained. Bilateral screening digital breast tomosynthesis was performed. The images were evaluated with computer-aided detection. COMPARISON:  Previous exam(s). ACR Breast Density Category b: There are scattered areas of fibroglandular density. FINDINGS: There are no findings suspicious for malignancy. IMPRESSION: No mammographic evidence of malignancy. A result letter of this screening mammogram will be mailed directly to the patient. RECOMMENDATION: Screening mammogram in one year. (Code:SM-B-01Y) BI-RADS CATEGORY  1: Negative. Electronically Signed   By: Debby Satterfield M.D.   On: 04/28/2024 14:54     Assessment/Plan There are no diagnoses linked to this encounter.   Cordella Shawl, MD  05/19/2024 12:29 PM

## 2024-05-20 ENCOUNTER — Ambulatory Visit: Admitting: Family Medicine

## 2024-05-20 ENCOUNTER — Encounter: Payer: Self-pay | Admitting: Family Medicine

## 2024-05-20 VITALS — BP 130/78 | HR 93 | Resp 16 | Ht 64.0 in | Wt 196.2 lb

## 2024-05-20 DIAGNOSIS — E785 Hyperlipidemia, unspecified: Secondary | ICD-10-CM | POA: Diagnosis not present

## 2024-05-20 DIAGNOSIS — E1159 Type 2 diabetes mellitus with other circulatory complications: Secondary | ICD-10-CM | POA: Diagnosis not present

## 2024-05-20 DIAGNOSIS — I152 Hypertension secondary to endocrine disorders: Secondary | ICD-10-CM

## 2024-05-20 DIAGNOSIS — I69359 Hemiplegia and hemiparesis following cerebral infarction affecting unspecified side: Secondary | ICD-10-CM | POA: Diagnosis not present

## 2024-05-20 DIAGNOSIS — E1169 Type 2 diabetes mellitus with other specified complication: Secondary | ICD-10-CM

## 2024-05-20 DIAGNOSIS — Z794 Long term (current) use of insulin: Secondary | ICD-10-CM

## 2024-05-20 DIAGNOSIS — E1129 Type 2 diabetes mellitus with other diabetic kidney complication: Secondary | ICD-10-CM | POA: Diagnosis not present

## 2024-05-20 DIAGNOSIS — G4733 Obstructive sleep apnea (adult) (pediatric): Secondary | ICD-10-CM

## 2024-05-20 DIAGNOSIS — N1831 Chronic kidney disease, stage 3a: Secondary | ICD-10-CM

## 2024-05-20 DIAGNOSIS — N2581 Secondary hyperparathyroidism of renal origin: Secondary | ICD-10-CM | POA: Diagnosis not present

## 2024-05-20 DIAGNOSIS — R809 Proteinuria, unspecified: Secondary | ICD-10-CM

## 2024-05-20 DIAGNOSIS — D631 Anemia in chronic kidney disease: Secondary | ICD-10-CM

## 2024-05-20 LAB — POCT GLYCOSYLATED HEMOGLOBIN (HGB A1C): Hemoglobin A1C: 6.7 % — AB (ref 4.0–5.6)

## 2024-05-20 MED ORDER — PIOGLITAZONE HCL 15 MG PO TABS: 15.0000 mg | ORAL_TABLET | Freq: Every day | ORAL | 1 refills | Status: DC

## 2024-05-20 MED ORDER — RYBELSUS 14 MG PO TABS: 1.0000 | ORAL_TABLET | Freq: Every day | ORAL | 1 refills | Status: DC

## 2024-05-20 MED ORDER — METFORMIN HCL ER 750 MG PO TB24: 1500.0000 mg | ORAL_TABLET | Freq: Every day | ORAL | 1 refills | Status: DC

## 2024-05-20 NOTE — Progress Notes (Signed)
 Name: Meagan [name]   MRN: [id number]    DOB: [birth year]   Date:05/20/2024       Progress Note  Subjective  Chief Complaint  Chief Complaint  Patient presents with   Medical Management of Chronic Issues   Discussed the use of AI scribe software for clinical note transcription with the patient, who gave verbal consent to proceed.  History of Present Illness Meagan [name] is a 73 year old female with a history of stroke and type 2 diabetes who presents for a four-month follow-up.  She continues to experience vision issues following her stroke, with peripheral vision loss on the left side and difficulty seeing below, affecting both eyes. This has resulted in her being unable to drive. The stroke occurred nearly five years ago, initially leaving her unable to perform daily activities independently. Over time, she has regained some independence, although she still experiences left-sided weakness and occasionally uses a cane to prevent falls.  Her type 2 diabetes is complicated by chronic kidney disease stage 3A, dyslipidemia, and proteinuria. Her most recent A1c was 6.7%. She is on Toujeo  at 16 units, Rybelsus  14 mg, and metformin  once daily. She has previously experienced side effects with Ozempic  and is allergic to Jardiance  and Farxiga .  She has chronic kidney disease stage 3A, for which she takes olmesartan . She drinks mostly water or lemon water and has reduced her intake of sugary and carbonated beverages. Her proteinuria has been measured at levels over 1000, and she has a history of secondary hyperparathyroidism.  She has obstructive sleep apnea and is 100% compliant with her CPAP therapy, seeing her sleep specialist annually. Her BMI has been decreasing, and she maintains a mindful diet, avoiding colas and sugary drinks. Her blood pressure is well-controlled with amlodipine , olmesartan , HCTZ, and carvedilol .  She has hypertensive retinopathy and sees an eye doctor every 12 weeks. Her right  eye vision has improved from 20/200 to 20/50, while her left eye remains at 20/2400, though it has improved slightly. She also has anemia related to her chronic kidney disease, which is monitored regularly.    Patient Active Problem List   Diagnosis Date Noted   History of colonic polyps 01/04/2023   Adenomatous polyp of colon 01/04/2023   Anemia in chronic kidney disease 12/21/2021   Chronic kidney disease, stage 3a (HCC) 12/21/2021   History of CVA with residual deficit 07/01/2019   Dyslipidemia associated with type 2 diabetes mellitus (HCC) 12/04/2016   Controlled type 2 diabetes mellitus with microalbuminuria (HCC) 12/04/2016   Seasonal allergies 02/01/2016   OSA (obstructive sleep apnea) 10/06/2015   Hypertensive retinopathy 10/06/2015   Thyroid  cyst 10/06/2015   Controlled type 2 diabetes with neuropathy (HCC) 10/06/2015   History of pneumonia 10/06/2015   Chronic neck pain 10/06/2015   Bilateral carotid artery disease, unspecified type (HCC)    Hypertension, benign     Past Surgical History:  Procedure Laterality Date   APPENDECTOMY  2000s   BIOPSY THYROID   2006   [facility]   BREAST EXCISIONAL BIOPSY Left 2000s   neg   CARPAL TUNNEL RELEASE Left 12/2013   CATARACT EXTRACTION, BILATERAL Bilateral    Right 12/14/21 and Left 01/04/22   COLONOSCOPY  2011   DR[facility]   COLONOSCOPY WITH PROPOFOL  N/A 07/11/2017   Procedure: COLONOSCOPY WITH PROPOFOL ;  Surgeon: Therisa Bi, MD;  Location: [facility] ENDOSCOPY;  Service: Gastroenterology;  Laterality: N/A;   COLONOSCOPY WITH PROPOFOL  N/A 01/04/2023   Procedure: COLONOSCOPY WITH PROPOFOL ;  Surgeon: Therisa Bi,  MD;  Location: [facility] ENDOSCOPY;  Service: Gastroenterology;  Laterality: N/A;   EYE SURGERY     ganglion cyst removal      KNEE SURGERY Left 08/2009   KNEE SURGERY Right 04/07/2010   REFRACTIVE SURGERY Bilateral    ROTATOR CUFF REPAIR Right 2008   TUBAL LIGATION      Family History  Problem Relation Age of Onset   Breast cancer  Maternal Aunt    Hypertension Mother    Kidney Stones Mother    Hypertension Father    Congestive Heart Failure Father    Hypertension Paternal Grandfather    Diabetes Sister    Vision loss Sister    Varicose Veins Sister    Hypertension Sister    Breast cancer Sister 45       Double Mastectomy   Hypertension Sister     Social History   Tobacco Use   Smoking status: Never   Smokeless tobacco: Never  Substance Use Topics   Alcohol  use: No     Current Outpatient Medications:    amLODipine  (NORVASC ) 5 MG tablet, TAKE 1 TABLET (5 MG TOTAL) BY MOUTH DAILY., Disp: 90 tablet, Rfl: 0   atorvastatin  (LIPITOR ) 80 MG tablet, TAKE 1 TABLET BY MOUTH DAILY AT 6 PM., Disp: 90 tablet, Rfl: 1   BD PEN NEEDLE NANO 2ND GEN 32G X 4 MM MISC, USE AS DIRECTED, Disp: 100 each, Rfl: 1   Blood Glucose Monitoring Suppl (ACCU-CHEK GUIDE ME) w/Device KIT, CHECK IN THE MORNING AND AT BEDTIME. CHECK TWICE DAILY ICD-10 E10.9, E11.9, Disp: 1 kit, Rfl: 0   carvedilol  (COREG ) 3.125 MG tablet, Take 1 tablet (3.125 mg total) by mouth 2 (two) times daily with a meal., Disp: 180 tablet, Rfl: 1   clopidogrel  (PLAVIX ) 75 MG tablet, TAKE 1 TABLET BY MOUTH EVERY DAY, Disp: 90 tablet, Rfl: 0   docusate sodium  (COLACE) 100 MG capsule, TAKE 1 CAPSULE (100 MG TOTAL) BY MOUTH DAILY AS NEEDED FOR MILD CONSTIPATION., Disp: 60 capsule, Rfl: 0   dorzolamide-timolol (COSOPT) 22.3-6.8 MG/ML ophthalmic solution, SMARTSIG:In Eye(s), Disp: , Rfl:    fluticasone  (FLONASE ) 50 MCG/ACT nasal spray, Place 2 sprays into both nostrils daily., Disp: 16 g, Rfl: 6   Glucose Blood (BLOOD GLUCOSE TEST STRIPS) STRP, CHECK IN THE MORNING AND AT BEDTIME. CHECK TWICE DAILY ICD-10 E10.9, E11.9 May substitute to any manufacturer covered by patient's insurance. Patient states Accu-Chek per insurance, Disp: 100 strip, Rfl: 12   levocetirizine (XYZAL ) 5 MG tablet, TAKE 1 TABLET BY MOUTH DAILY AS NEEDED FOR ALLERGIES., Disp: 90 tablet, Rfl: 0   metFORMIN   (GLUCOPHAGE -XR) 750 MG 24 hr tablet, TAKE 1 TABLET BY MOUTH EVERY DAY WITH BREAKFAST, Disp: 30 tablet, Rfl: 0   olmesartan -hydrochlorothiazide  (BENICAR  HCT) 40-25 MG tablet, TAKE 1 TABLET BY MOUTH EVERY DAY, Disp: 90 tablet, Rfl: 0   polyethylene glycol (MIRALAX  / GLYCOLAX ) 17 g packet, Take 17 g by mouth daily., Disp: 14 each, Rfl: 0   RYBELSUS  14 MG TABS, TAKE 1 TABLET (14 MG TOTAL) BY MOUTH DAILY, Disp: 90 tablet, Rfl: 0  Allergies  Allergen Reactions   Jardiance  [Empagliflozin ] Rash   Amoxicillin     rash   Bee Venom Swelling and Other (See Comments)    Immediately takes Benadryl  to counteract   Trulicity  [Dulaglutide ] Hives   Farxiga  [Dapagliflozin ] Rash    Pt reports that she developed a rash when she took Farxiga  and she would like it to be added as an allergy  Keflex [Cephalexin] Rash    I personally reviewed active problem list, medication list, allergies with the patient/caregiver today.   ROS  Ten systems reviewed and is negative except as mentioned in HPI    Objective Physical Exam  CONSTITUTIONAL: Patient appears well-developed and well-nourished. No distress. HEENT: Head atraumatic, normocephalic, neck supple. CARDIOVASCULAR: Normal rate, regular rhythm and normal heart sounds. No murmur heard. No BLE edema. PULMONARY: Effort normal and breath sounds normal. No respiratory distress. MUSCULOSKELETAL: slow gait, using a cane due to left side hemiparesis strength slightly weaker on left side. PSYCHIATRIC: Patient has a normal mood and affect. Behavior is normal. Judgment and thought content normal.  Vitals:   05/20/24 1425  BP: 130/78  Pulse: 93  Resp: 16  SpO2: 99%  Weight: 196 lb 3.2 oz (89 kg)  Height: 5' 4 (1.626 m)    Body mass index is 33.68 kg/m.  Recent Results (from the past 2160 hours)  POCT glycosylated hemoglobin (Hb A1C)     Status: Abnormal   Collection Time: 05/20/24  2:34 PM  Result Value Ref Range   Hemoglobin A1C 6.7 (A) 4.0 - 5.6 %    HbA1c POC (<> result, manual entry)     HbA1c, POC (prediabetic range)     HbA1c, POC (controlled diabetic range)      Diabetic Foot Exam:     PHQ2/9:    05/20/2024    2:18 PM 11/28/2023    3:59 PM 08/30/2023    1:13 PM 04/05/2023    9:02 AM 11/28/2022    2:34 PM  Depression screen PHQ 2/9  Decreased Interest 0 0 0 0 0  Down, Depressed, Hopeless 0 0 0 0 0  PHQ - 2 Score 0 0 0 0 0  Altered sleeping  0 0 0 0  Tired, decreased energy  0 0 0 0  Change in appetite  0 0 0 0  Feeling bad or failure about yourself   0 0 0 0  Trouble concentrating  0 0 0 0  Moving slowly or fidgety/restless  0 0 0 0  Suicidal thoughts  0 0 0 0  PHQ-9 Score  0 0 0 0  Difficult doing work/chores  Not difficult at all       phq 9 is negative  Fall Risk:    05/20/2024    2:18 PM 11/28/2023    4:03 PM 08/30/2023    1:13 PM 04/05/2023    9:02 AM 11/28/2022    2:34 PM  Fall Risk   Falls in the past year? 0 0 0 0 0  Number falls in past yr: 0 0 0 0   Injury with Fall? 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 She uses a cane for mobility and cannot drive due to peripheral vision loss. Quality of life has improved with increased independence.  Type 2 diabetes mellitus with chronic kidney disease, proteinuria, and hyperlipidemia Type 2 diabetes with stage 3A chronic kidney disease, proteinuria, and hyperlipidemia. Hemoglobin A1c is 6.7%. Allergic to Jardiance  and Farxiga .  -  Increase metformin  750 to two tablets daily. - Add pioglitazone  15 mg daily. - Reduce Toujeo   from 16 units to 14 units, then gradually decrease further. - Check CBC, cholesterol panel, vitamin D , and parathyroid  hormone levels. - Monitor proteinuria and kidney function. - Continue Rybelsus  14 mg daily. - Encourage dietary modifications to reduce sugary beverages and increase water intake.  Hypertension associated with DM Hypertension well controlled with current medications. Blood pressure is 130/78 mmHg. - Continue amlodipine , olmesartan , HCTZ, and carvedilol .  Hypertensive retinopathy Hypertensive retinopathy with improved visual acuity. Right eye improved to 20/50, left eye to 20/2300. - Continue follow-up with ophthalmologist every 12 weeks.  Obstructive sleep apnea on CPAP Obstructive sleep apnea with 100% CPAP compliance. Annual follow-up with sleep specialist scheduled. - Follow up with sleep specialist on November 10th.  Anemia of chronic kidney disease stage 3a  Anemia secondary to chronic kidney disease. Not iron deficiency anemia. - Check hemoglobin levels. - discussed going back to nephrologist due to macroalbuminuria but she wants to hold off for now   Secondary hyperparathyroidism of chronic kidney disease Secondary hyperparathyroidism related to chronic kidney disease. - Check parathyroid  hormone levels.

## 2024-05-21 ENCOUNTER — Encounter (INDEPENDENT_AMBULATORY_CARE_PROVIDER_SITE_OTHER): Payer: Self-pay | Admitting: Vascular Surgery

## 2024-05-21 ENCOUNTER — Ambulatory Visit (INDEPENDENT_AMBULATORY_CARE_PROVIDER_SITE_OTHER)

## 2024-05-21 ENCOUNTER — Ambulatory Visit: Payer: Self-pay | Admitting: Family Medicine

## 2024-05-21 ENCOUNTER — Ambulatory Visit (INDEPENDENT_AMBULATORY_CARE_PROVIDER_SITE_OTHER): Admitting: Vascular Surgery

## 2024-05-21 VITALS — BP 128/79 | HR 72 | Ht 64.0 in | Wt 192.0 lb

## 2024-05-21 DIAGNOSIS — I10 Essential (primary) hypertension: Secondary | ICD-10-CM

## 2024-05-21 DIAGNOSIS — E785 Hyperlipidemia, unspecified: Secondary | ICD-10-CM | POA: Diagnosis not present

## 2024-05-21 DIAGNOSIS — I6523 Occlusion and stenosis of bilateral carotid arteries: Secondary | ICD-10-CM

## 2024-05-21 DIAGNOSIS — E1140 Type 2 diabetes mellitus with diabetic neuropathy, unspecified: Secondary | ICD-10-CM | POA: Diagnosis not present

## 2024-05-21 DIAGNOSIS — I779 Disorder of arteries and arterioles, unspecified: Secondary | ICD-10-CM

## 2024-05-21 LAB — CBC WITH DIFFERENTIAL/PLATELET
Absolute Lymphocytes: 2228 {cells}/uL (ref 850–3900)
Absolute Monocytes: 591 {cells}/uL (ref 200–950)
Basophils Absolute: 41 {cells}/uL (ref 0–200)
Basophils Relative: 0.5 %
Eosinophils Absolute: 113 {cells}/uL (ref 15–500)
Eosinophils Relative: 1.4 %
HCT: 32.4 % — ABNORMAL LOW (ref 35.0–45.0)
Hemoglobin: 10.7 g/dL — ABNORMAL LOW (ref 11.7–15.5)
MCH: 30.1 pg (ref 27.0–33.0)
MCHC: 33 g/dL (ref 32.0–36.0)
MCV: 91.3 fL (ref 80.0–100.0)
MPV: 10.1 fL (ref 7.5–12.5)
Monocytes Relative: 7.3 %
Neutro Abs: 5127 {cells}/uL (ref 1500–7800)
Neutrophils Relative %: 63.3 %
Platelets: 277 Thousand/uL (ref 140–400)
RBC: 3.55 Million/uL — ABNORMAL LOW (ref 3.80–5.10)
RDW: 12.8 % (ref 11.0–15.0)
Total Lymphocyte: 27.5 %
WBC: 8.1 Thousand/uL (ref 3.8–10.8)

## 2024-05-21 LAB — COMPREHENSIVE METABOLIC PANEL WITH GFR
AG Ratio: 1.3 (calc) (ref 1.0–2.5)
ALT: 17 U/L (ref 6–29)
AST: 21 U/L (ref 10–35)
Albumin: 4 g/dL (ref 3.6–5.1)
Alkaline phosphatase (APISO): 104 U/L (ref 37–153)
BUN/Creatinine Ratio: 17 (calc) (ref 6–22)
BUN: 19 mg/dL (ref 7–25)
CO2: 32 mmol/L (ref 20–32)
Calcium: 9.5 mg/dL (ref 8.6–10.4)
Chloride: 104 mmol/L (ref 98–110)
Creat: 1.14 mg/dL — ABNORMAL HIGH (ref 0.60–1.00)
Globulin: 3 g/dL (ref 1.9–3.7)
Glucose, Bld: 119 mg/dL — ABNORMAL HIGH (ref 65–99)
Potassium: 3.9 mmol/L (ref 3.5–5.3)
Sodium: 145 mmol/L (ref 135–146)
Total Bilirubin: 0.4 mg/dL (ref 0.2–1.2)
Total Protein: 7 g/dL (ref 6.1–8.1)
eGFR: 52 mL/min/1.73m2 — ABNORMAL LOW (ref >=60)

## 2024-05-21 LAB — MICROALBUMIN / CREATININE URINE RATIO
Creatinine, Urine: 48 mg/dL (ref 20–275)
Microalb Creat Ratio: 421 mg/g{creat} — ABNORMAL HIGH (ref <30)
Microalb, Ur: 20.2 mg/dL

## 2024-05-21 LAB — VITAMIN D 25 HYDROXY (VIT D DEFICIENCY, FRACTURES): Vit D, 25-Hydroxy: 53 ng/mL (ref 30–100)

## 2024-05-21 LAB — LIPID PANEL
Cholesterol: 95 mg/dL (ref <200)
HDL: 29 mg/dL — ABNORMAL LOW (ref >=50)
LDL Cholesterol (Calc): 44 mg/dL
Non-HDL Cholesterol (Calc): 66 mg/dL (ref <130)
Total CHOL/HDL Ratio: 3.3 (calc) (ref <5.0)
Triglycerides: 137 mg/dL (ref <150)

## 2024-05-21 LAB — PARATHYROID HORMONE, INTACT (NO CA): PTH: 34 pg/mL (ref 16–77)

## 2024-05-23 ENCOUNTER — Other Ambulatory Visit: Payer: Self-pay | Admitting: Internal Medicine

## 2024-05-23 DIAGNOSIS — I69359 Hemiplegia and hemiparesis following cerebral infarction affecting unspecified side: Secondary | ICD-10-CM

## 2024-05-26 ENCOUNTER — Encounter (INDEPENDENT_AMBULATORY_CARE_PROVIDER_SITE_OTHER): Payer: Self-pay | Admitting: Vascular Surgery

## 2024-05-26 NOTE — Telephone Encounter (Signed)
 Requested Prescriptions  Refused Prescriptions Disp Refills   atorvastatin  (LIPITOR ) 80 MG tablet [Pharmacy Med Name: ATORVASTATIN  80 MG TABLET] 90 tablet 1    Sig: TAKE 1 TABLET BY MOUTH DAILY AT 6 PM.     Cardiovascular:  Antilipid - Statins Failed - 05/26/2024  2:41 PM      Failed - Lipid Panel in normal range within the last 12 months    Cholesterol, Total  Date Value Ref Range Status  01/31/2016 200 (H) 100 - 199 mg/dL Final   Cholesterol  Date Value Ref Range Status  05/20/2024 95 <200 mg/dL Final   LDL Cholesterol (Calc)  Date Value Ref Range Status  05/20/2024 44 mg/dL (calc) Final    Comment:    Reference range: <100 . Desirable range <100 mg/dL for primary prevention;   <70 mg/dL for patients with CHD or diabetic patients  with > or = 2 CHD risk factors. SABRA LDL-C is now calculated using the Martin-Hopkins  calculation, which is a validated novel method providing  better accuracy than the Friedewald equation in the  estimation of LDL-C.  Gladis APPLETHWAITE et al. SANDREA. 7986;689(80): 2061-2068  (http://education.[facility].com/faq/FAQ164)    HDL  Date Value Ref Range Status  05/20/2024 29 (L) > OR = 50 mg/dL Final  95/74/7982 34 (L) >39 mg/dL Final   Triglycerides  Date Value Ref Range Status  05/20/2024 137 <150 mg/dL Final         Passed - Patient is not pregnant      Passed - Valid encounter within last 12 months    Recent Outpatient Visits           6 days ago Dyslipidemia associated with type 2 diabetes mellitus [facility])   [facility] Glenard Mire, MD   4 months ago Dyslipidemia associated with type 2 diabetes mellitus [facility])   [facility] Bernardo Fend, DO       Future Appointments             In 3 months Glenard, Krichna, MD [facility], [facility]

## 2024-06-07 ENCOUNTER — Other Ambulatory Visit: Payer: Self-pay | Admitting: Family Medicine

## 2024-06-07 DIAGNOSIS — I10 Essential (primary) hypertension: Secondary | ICD-10-CM

## 2024-06-07 DIAGNOSIS — I69359 Hemiplegia and hemiparesis following cerebral infarction affecting unspecified side: Secondary | ICD-10-CM

## 2024-06-11 DIAGNOSIS — E113312 Type 2 diabetes mellitus with moderate nonproliferative diabetic retinopathy with macular edema, left eye: Secondary | ICD-10-CM | POA: Diagnosis not present

## 2024-07-02 DIAGNOSIS — G4733 Obstructive sleep apnea (adult) (pediatric): Secondary | ICD-10-CM | POA: Diagnosis not present

## 2024-07-10 DIAGNOSIS — I6930 Unspecified sequelae of cerebral infarction: Secondary | ICD-10-CM | POA: Diagnosis not present

## 2024-07-10 DIAGNOSIS — H40013 Open angle with borderline findings, low risk, bilateral: Secondary | ICD-10-CM | POA: Diagnosis not present

## 2024-07-10 DIAGNOSIS — H35033 Hypertensive retinopathy, bilateral: Secondary | ICD-10-CM | POA: Diagnosis not present

## 2024-07-10 DIAGNOSIS — H40053 Ocular hypertension, bilateral: Secondary | ICD-10-CM | POA: Diagnosis not present

## 2024-07-10 DIAGNOSIS — E113313 Type 2 diabetes mellitus with moderate nonproliferative diabetic retinopathy with macular edema, bilateral: Secondary | ICD-10-CM | POA: Diagnosis not present

## 2024-07-10 DIAGNOSIS — H21233 Degeneration of iris (pigmentary), bilateral: Secondary | ICD-10-CM | POA: Diagnosis not present

## 2024-07-17 ENCOUNTER — Other Ambulatory Visit: Payer: Self-pay | Admitting: Family Medicine

## 2024-07-17 DIAGNOSIS — E1169 Type 2 diabetes mellitus with other specified complication: Secondary | ICD-10-CM

## 2024-07-18 ENCOUNTER — Other Ambulatory Visit: Payer: Self-pay | Admitting: Internal Medicine

## 2024-07-18 DIAGNOSIS — J302 Other seasonal allergic rhinitis: Secondary | ICD-10-CM

## 2024-07-21 NOTE — Telephone Encounter (Signed)
 Too soon for refill.  Requested Prescriptions  Pending Prescriptions Disp Refills   fluticasone  (FLONASE ) 50 MCG/ACT nasal spray [Pharmacy Med Name: FLUTICASONE  PROP 50 MCG SPRAY] 48 mL 2    Sig: SPRAY 2 SPRAYS INTO EACH NOSTRIL EVERY DAY     Ear, Nose, and Throat: Nasal Preparations - Corticosteroids Passed - 07/21/2024 11:57 AM      Passed - Valid encounter within last 12 months    Recent Outpatient Visits           2 months ago Dyslipidemia associated with type 2 diabetes mellitus [facility])    [facility] Glenard Mire, MD   6 months ago Dyslipidemia associated with type 2 diabetes mellitus [facility])   [facility] Bernardo Fend, DO       Future Appointments             In 2 months Glenard, Krichna, MD [facility], [place]

## 2024-07-23 DIAGNOSIS — H3582 Retinal ischemia: Secondary | ICD-10-CM | POA: Diagnosis not present

## 2024-07-23 DIAGNOSIS — E113313 Type 2 diabetes mellitus with moderate nonproliferative diabetic retinopathy with macular edema, bilateral: Secondary | ICD-10-CM | POA: Diagnosis not present

## 2024-07-23 DIAGNOSIS — H43813 Vitreous degeneration, bilateral: Secondary | ICD-10-CM | POA: Diagnosis not present

## 2024-07-23 DIAGNOSIS — H35033 Hypertensive retinopathy, bilateral: Secondary | ICD-10-CM | POA: Diagnosis not present

## 2024-08-14 DIAGNOSIS — E113313 Type 2 diabetes mellitus with moderate nonproliferative diabetic retinopathy with macular edema, bilateral: Secondary | ICD-10-CM | POA: Diagnosis not present

## 2024-08-14 DIAGNOSIS — I6930 Unspecified sequelae of cerebral infarction: Secondary | ICD-10-CM | POA: Diagnosis not present

## 2024-08-14 DIAGNOSIS — H40013 Open angle with borderline findings, low risk, bilateral: Secondary | ICD-10-CM | POA: Diagnosis not present

## 2024-08-14 DIAGNOSIS — H35033 Hypertensive retinopathy, bilateral: Secondary | ICD-10-CM | POA: Diagnosis not present

## 2024-08-14 DIAGNOSIS — H21233 Degeneration of iris (pigmentary), bilateral: Secondary | ICD-10-CM | POA: Diagnosis not present

## 2024-08-17 ENCOUNTER — Telehealth: Payer: Self-pay | Admitting: Internal Medicine

## 2024-08-17 ENCOUNTER — Encounter: Payer: Self-pay | Admitting: Internal Medicine

## 2024-08-17 ENCOUNTER — Ambulatory Visit: Admitting: Internal Medicine

## 2024-08-17 VITALS — BP 133/60 | HR 94 | Temp 98.0°F | Resp 16 | Ht 64.0 in | Wt 192.0 lb

## 2024-08-17 DIAGNOSIS — G4733 Obstructive sleep apnea (adult) (pediatric): Secondary | ICD-10-CM

## 2024-08-17 NOTE — Telephone Encounter (Signed)
 Awaiting 08/17/24 notes for SS-Toni

## 2024-08-17 NOTE — Progress Notes (Signed)
 [facility] [address] [place], [place] [address]  Pulmonary Sleep Medicine   Office Visit Note  Patient Name: Meagan [name] DOB: 02-05-1951 MRN [id number]  Date of Service: 08/17/2024  Complaints/HPI: She is doing well with her CPAP excellent compliance. Her machine is over 71 years old and has been showing signs of failure. She needs a new machine. Her last HST was in 2020. Patient will need a follow up test as there has been change in weight and currently her PAP device may be failing  Office Spirometry Results:     ROS  General: (-) fever, (-) chills, (-) night sweats, (-) weakness Skin: (-) rashes, (-) itching,. Eyes: (-) visual changes, (-) redness, (-) itching. Nose and Sinuses: (-) nasal stuffiness or itchiness, (-) postnasal drip, (-) nosebleeds, (-) sinus trouble. Mouth and Throat: (-) sore throat, (-) hoarseness. Neck: (-) swollen glands, (-) enlarged thyroid , (-) neck pain. Respiratory: - cough, (-) bloody sputum, - shortness of breath, - wheezing. Cardiovascular: - ankle swelling, (-) chest pain. Lymphatic: (-) lymph node enlargement. Neurologic: (-) numbness, (-) tingling. Psychiatric: (-) anxiety, (-) depression   Current Medication: Outpatient Encounter Medications as of 08/17/2024  Medication Sig Note   amLODipine  (NORVASC ) 5 MG tablet TAKE 1 TABLET (5 MG TOTAL) BY MOUTH DAILY.    atorvastatin  (LIPITOR ) 80 MG tablet TAKE 1 TABLET BY MOUTH DAILY AT 6 PM.    Blood Glucose Monitoring Suppl (ACCU-CHEK GUIDE ME) w/Device KIT CHECK IN THE MORNING AND AT BEDTIME. CHECK TWICE DAILY ICD-10 E10.9, E11.9    carvedilol  (COREG ) 3.125 MG tablet Take 1 tablet (3.125 mg total) by mouth 2 (two) times daily with a meal.    clopidogrel  (PLAVIX ) 75 MG tablet TAKE 1 TABLET BY MOUTH EVERY DAY    docusate sodium  (COLACE) 100 MG capsule TAKE 1 CAPSULE (100 MG TOTAL) BY MOUTH DAILY AS NEEDED FOR MILD CONSTIPATION.    dorzolamide-timolol (COSOPT) 22.3-6.8 MG/ML  ophthalmic solution SMARTSIG:In Eye(s)    EMBECTA PEN NEEDLE NANO 2 GEN 32G X 4 MM MISC USE AS DIRECTED    fluticasone  (FLONASE ) 50 MCG/ACT nasal spray Place 2 sprays into both nostrils daily.    Glucose Blood (BLOOD GLUCOSE TEST STRIPS) STRP CHECK IN THE MORNING AND AT BEDTIME. CHECK TWICE DAILY ICD-10 E10.9, E11.9 May substitute to any manufacturer covered by patient's insurance. Patient states Accu-Chek per insurance    levocetirizine (XYZAL ) 5 MG tablet TAKE 1 TABLET BY MOUTH DAILY AS NEEDED FOR ALLERGIES.    metFORMIN  (GLUCOPHAGE -XR) 750 MG 24 hr tablet Take 2 tablets (1,500 mg total) by mouth daily with breakfast.    olmesartan -hydrochlorothiazide  (BENICAR  HCT) 40-25 MG tablet TAKE 1 TABLET BY MOUTH EVERY DAY    pioglitazone  (ACTOS ) 15 MG tablet Take 1 tablet (15 mg total) by mouth daily.    polyethylene glycol (MIRALAX  / GLYCOLAX ) 17 g packet Take 17 g by mouth daily. 11/28/2023: TAKES PRN   Semaglutide  (RYBELSUS ) 14 MG TABS Take 1 tablet (14 mg total) by mouth daily.    No facility-administered encounter medications on file as of 08/17/2024.    Surgical History: Past Surgical History:  Procedure Laterality Date   APPENDECTOMY  2000s   BIOPSY THYROID   2006   [facility]   BREAST EXCISIONAL BIOPSY Left 2000s   neg   CARPAL TUNNEL RELEASE Left 12/2013   CATARACT EXTRACTION, BILATERAL Bilateral    Right 12/14/21 and Left 01/04/22   COLONOSCOPY  2011   DR.WOHL   COLONOSCOPY WITH PROPOFOL  N/A 07/11/2017  Procedure: COLONOSCOPY WITH PROPOFOL ;  Surgeon: Therisa Bi, MD;  Location: [facility] ENDOSCOPY;  Service: Gastroenterology;  Laterality: N/A;   COLONOSCOPY WITH PROPOFOL  N/A 01/04/2023   Procedure: COLONOSCOPY WITH PROPOFOL ;  Surgeon: Therisa Bi, MD;  Location: [facility] ENDOSCOPY;  Service: Gastroenterology;  Laterality: N/A;   EYE SURGERY     ganglion cyst removal      KNEE SURGERY Left 08/2009   KNEE SURGERY Right 04/07/2010   REFRACTIVE SURGERY Bilateral    ROTATOR CUFF REPAIR Right 2008    TUBAL LIGATION      Medical History: Past Medical History:  Diagnosis Date   Cellulitis    left leg   Diabetes mellitus without complication (HCC)    non-insulin  dependent   Diffuse cystic mastopathy    Fall 2013   Hypertension 2004   Occlusion and stenosis of carotid artery without mention of cerebral infarction    Sleep apnea    Stroke [facility])     Family History: Family History  Problem Relation Age of Onset   Breast cancer Maternal Aunt    Hypertension Mother    Kidney Stones Mother    Hypertension Father    Congestive Heart Failure Father    Hypertension Paternal Grandfather    Diabetes Sister    Vision loss Sister    Varicose Veins Sister    Hypertension Sister    Breast cancer Sister 29       Double Mastectomy   Hypertension Sister     Social History: Social History   Socioeconomic History   Marital status: Widowed    Spouse name: Not on file   Number of children: 4   Years of education: Not on file   Highest education level: Master's degree (e.g., MA, MS, MEng, MEd, MSW, MBA)  Occupational History   Occupation: primary school teacher   Tobacco Use   Smoking status: Never   Smokeless tobacco: Never  Vaping Use   Vaping status: Never Used  Substance and Sexual Activity   Alcohol  use: No   Drug use: No   Sexual activity: Not Currently    Partners: Male    Comment: Widow for 14 years  Other Topics Concern   Not on file  Social History Narrative   She had a stroke on 06/2019, she has left peripheral vision loss since stroke   She moved in with her youngest sister    Social Drivers of Corporate Investment Banker Strain: Low Risk  (11/28/2023)   Overall Financial Resource Strain (CARDIA)    Difficulty of Paying Living Expenses: Not hard at all  Food Insecurity: No Food Insecurity (11/28/2023)   Hunger Vital Sign    Worried About Running Out of Food in the Last Year: Never true    Ran Out of Food in the Last Year: Never true  Transportation Needs: No  Transportation Needs (11/28/2023)   PRAPARE - Administrator, Civil Service (Medical): No    Lack of Transportation (Non-Medical): No  Physical Activity: Sufficiently Active (11/28/2023)   Exercise Vital Sign    Days of Exercise per Week: 7 days    Minutes of Exercise per Session: 30 min  Stress: No Stress Concern Present (11/28/2023)   [employer] of Occupational Health - Occupational Stress Questionnaire    Feeling of Stress : Not at all  Social Connections: Moderately Integrated (11/28/2023)   Social Connection and Isolation Panel    Frequency of Communication with Friends and Family: More than three times a week  Frequency of Social Gatherings with Friends and Family: More than three times a week    Attends Religious Services: More than 4 times per year    Active Member of Clubs or Organizations: Yes    Attends Banker Meetings: More than 4 times per year    Marital Status: Widowed  Intimate Partner Violence: Not At Risk (11/28/2023)   Humiliation, Afraid, Rape, and Kick questionnaire    Fear of Current or Ex-Partner: No    Emotionally Abused: No    Physically Abused: No    Sexually Abused: No    Vital Signs: Blood pressure 133/60, pulse 94, temperature 98 F (36.7 C), resp. rate 16, height 5' 4 (1.626 m), weight 192 lb (87.1 kg), SpO2 100%.  Examination: General Appearance: The patient is well-developed, well-nourished, and in no distress. Skin: Gross inspection of skin unremarkable. Head: normocephalic, no gross deformities. Eyes: no gross deformities noted. ENT: ears appear grossly normal no exudates. Neck: Supple. No thyromegaly. No LAD. Respiratory: no rhonch note. Cardiovascular: Normal S1 and S2 without murmur or rub. Extremities: No cyanosis. pulses are equal. Neurologic: Alert and oriented. No involuntary movements.  LABS: Recent Results (from the past 2160 hours)  POCT glycosylated hemoglobin (Hb A1C)     Status: Abnormal    Collection Time: 05/20/24  2:34 PM  Result Value Ref Range   Hemoglobin A1C 6.7 (A) 4.0 - 5.6 %   HbA1c POC (<> result, manual entry)     HbA1c, POC (prediabetic range)     HbA1c, POC (controlled diabetic range)    Comprehensive metabolic panel with GFR     Status: Abnormal   Collection Time: 05/20/24  3:08 PM  Result Value Ref Range   Glucose, Bld 119 (H) 65 - 99 mg/dL    Comment: .            Fasting reference interval . For someone without known diabetes, a glucose value between 100 and 125 mg/dL is consistent with prediabetes and should be confirmed with a follow-up test. .    BUN 19 7 - 25 mg/dL   Creat 8.85 (H) 9.39 - 1.00 mg/dL   eGFR 52 (L) > OR = 60 mL/min/1.58m2   BUN/Creatinine Ratio 17 6 - 22 (calc)   Sodium 145 135 - 146 mmol/L   Potassium 3.9 3.5 - 5.3 mmol/L   Chloride 104 98 - 110 mmol/L   CO2 32 20 - 32 mmol/L   Calcium  9.5 8.6 - 10.4 mg/dL   Total Protein 7.0 6.1 - 8.1 g/dL   Albumin 4.0 3.6 - 5.1 g/dL   Globulin 3.0 1.9 - 3.7 g/dL (calc)   AG Ratio 1.3 1.0 - 2.5 (calc)   Total Bilirubin 0.4 0.2 - 1.2 mg/dL   Alkaline phosphatase (APISO) 104 37 - 153 U/L   AST 21 10 - 35 U/L   ALT 17 6 - 29 U/L  CBC with Differential/Platelet     Status: Abnormal   Collection Time: 05/20/24  3:08 PM  Result Value Ref Range   WBC 8.1 3.8 - 10.8 Thousand/uL   RBC 3.55 (L) 3.80 - 5.10 Million/uL   Hemoglobin 10.7 (L) 11.7 - 15.5 g/dL   HCT 67.5 (L) 64.9 - 54.9 %   MCV 91.3 80.0 - 100.0 fL   MCH 30.1 27.0 - 33.0 pg   MCHC 33.0 32.0 - 36.0 g/dL    Comment: For adults, a slight decrease in the calculated MCHC value (in the range of 30 to 32  g/dL) is most likely not clinically significant; however, it should be interpreted with caution in correlation with other red cell parameters and the patient's clinical condition.    RDW 12.8 11.0 - 15.0 %   Platelets 277 140 - 400 Thousand/uL   MPV 10.1 7.5 - 12.5 fL   Neutro Abs 5,127 1,500 - 7,800 cells/uL   Absolute  Lymphocytes 2,228 850 - 3,900 cells/uL   Absolute Monocytes 591 200 - 950 cells/uL   Eosinophils Absolute 113 15 - 500 cells/uL   Basophils Absolute 41 0 - 200 cells/uL   Neutrophils Relative % 63.3 %   Total Lymphocyte 27.5 %   Monocytes Relative 7.3 %   Eosinophils Relative 1.4 %   Basophils Relative 0.5 %  Lipid panel     Status: Abnormal   Collection Time: 05/20/24  3:08 PM  Result Value Ref Range   Cholesterol 95 <200 mg/dL   HDL 29 (L) > OR = 50 mg/dL   Triglycerides 862 <849 mg/dL   LDL Cholesterol (Calc) 44 mg/dL (calc)    Comment: Reference range: <100 . Desirable range <100 mg/dL for primary prevention;   <70 mg/dL for patients with CHD or diabetic patients  with > or = 2 CHD risk factors. SABRA LDL-C is now calculated using the Martin-Hopkins  calculation, which is a validated novel method providing  better accuracy than the Friedewald equation in the  estimation of LDL-C.  Gladis APPLETHWAITE et al. SANDREA. 7986;689(80): 2061-2068  (http://education.[facility].com/faq/FAQ164)    Total CHOL/HDL Ratio 3.3 <5.0 (calc)   Non-HDL Cholesterol (Calc) 66 <869 mg/dL (calc)    Comment: For patients with diabetes plus 1 major ASCVD risk  factor, treating to a non-HDL-C goal of <100 mg/dL  (LDL-C of <29 mg/dL) is considered a therapeutic  option.   VITAMIN D  25 Hydroxy (Vit-D Deficiency, Fractures)     Status: None   Collection Time: 05/20/24  3:08 PM  Result Value Ref Range   Vit D, 25-Hydroxy 53 30 - 100 ng/mL    Comment: Vitamin D  Status         25-OH Vitamin D : . Deficiency:                    <20 ng/mL Insufficiency:             20 - 29 ng/mL Optimal:                 > or = 30 ng/mL . For 25-OH Vitamin D  testing on patients on  D2-supplementation and patients for whom quantitation  of D2 and D3 fractions is required, the QuestAssureD(TM) 25-OH VIT D, (D2,D3), LC/MS/MS is recommended: order  code 07111 (patients >14yrs). . See Note 1 . Note 1 . For additional  information, please refer to  http://education.[facility].com/faq/FAQ199  (This link is being provided for informational/ educational purposes only.)   Parathyroid  hormone, intact (no Ca)     Status: None   Collection Time: 05/20/24  3:08 PM  Result Value Ref Range   PTH 34 16 - 77 pg/mL    Comment: . Interpretive Guide    Intact PTH           Calcium  ------------------    ----------           ------- Normal Parathyroid     Normal               Normal Hypoparathyroidism    Low or Low Normal    Low Hyperparathyroidism  Primary            Normal or High       High    Secondary          High                 Normal or Low    Tertiary           High                 High Non-Parathyroid     Hypercalcemia      Low or Low Normal    High .   Urine Microalbumin w/creat. ratio     Status: Abnormal   Collection Time: 05/20/24  3:31 PM  Result Value Ref Range   Creatinine, Urine 48 20 - 275 mg/dL   Microalb, Ur 79.7 mg/dL    Comment: Reference Range Not established    Microalb Creat Ratio 421 (H) <30 mg/g creat    Comment: . The ADA defines abnormalities in albumin excretion as follows: SABRA Albuminuria Category        Result (mg/g creatinine) . Normal to Mildly increased   <30 Moderately increased         30-299  Severely increased           > OR = 300 . The ADA recommends that at least two of three specimens collected within a 3-6 month period be abnormal before considering a patient to be within a diagnostic category.     Radiology: MM 3D SCREENING MAMMOGRAM BILATERAL BREAST Result Date: 04/28/2024 CLINICAL DATA:  Screening. EXAM: DIGITAL SCREENING BILATERAL MAMMOGRAM WITH TOMOSYNTHESIS AND CAD TECHNIQUE: Bilateral screening digital craniocaudal and mediolateral oblique mammograms were obtained. Bilateral screening digital breast tomosynthesis was performed. The images were evaluated with computer-aided detection. COMPARISON:  Previous exam(s). ACR Breast Density Category  b: There are scattered areas of fibroglandular density. FINDINGS: There are no findings suspicious for malignancy. IMPRESSION: No mammographic evidence of malignancy. A result letter of this screening mammogram will be mailed directly to the patient. RECOMMENDATION: Screening mammogram in one year. (Code:SM-B-01Y) BI-RADS CATEGORY  1: Negative. Electronically Signed   By: Debby Satterfield M.D.   On: 04/28/2024 14:54    No results found.  No results found.  Assessment and Plan: Patient Active Problem List   Diagnosis Date Noted   History of colonic polyps 01/04/2023   Adenomatous polyp of colon 01/04/2023   Anemia in chronic kidney disease 12/21/2021   Chronic kidney disease, stage 3a (HCC) 12/21/2021   History of CVA with residual deficit 07/01/2019   Dyslipidemia associated with type 2 diabetes mellitus (HCC) 12/04/2016   Controlled type 2 diabetes mellitus with microalbuminuria (HCC) 12/04/2016   Seasonal allergies 02/01/2016   OSA (obstructive sleep apnea) 10/06/2015   Hypertensive retinopathy 10/06/2015   Thyroid  cyst 10/06/2015   Controlled type 2 diabetes with neuropathy (HCC) 10/06/2015   History of pneumonia 10/06/2015   Chronic neck pain 10/06/2015   Bilateral carotid artery disease, unspecified type    Hypertension, benign     1. OSA on CPAP (Primary) Her machine is near end-of-life actually beyond expected life so therefore she needs to get a new machine and has been more than 5 years also she has not had a PSG done in that same timeframe.  Will get her scheduled for a PSG baseline and then also try to get her prescription for the CPAP - PSG Sleep Study; Future  2. Morbid obesity (HCC) Obesity Counseling:  Had a lengthy discussion regarding patients BMI and weight issues. Patient was instructed on portion control as well as increased activity. Also discussed caloric restrictions with trying to maintain intake less than 2000 Kcal. Discussions were made in accordance with the  5As of weight management. Simple actions such as not eating late and if able to, taking a walk is suggested.     General Counseling: I have discussed the findings of the evaluation and examination with [place].  I have also discussed any further diagnostic evaluation thatmay be needed or ordered today. Euleta verbalizes understanding of the findings of todays visit. We also reviewed her medications today and discussed drug interactions and side effects including but not limited excessive drowsiness and altered mental states. We also discussed that there is always a risk not just to her but also people around her. she has been encouraged to call the office with any questions or concerns that should arise related to todays visit.  No orders of the defined types were placed in this encounter.    Time spent: 43  I have personally obtained a history, examined the patient, evaluated laboratory and imaging results, formulated the assessment and plan and placed orders.    Elfreda DELENA Bathe, MD [facility] Pulmonary and Critical Care Sleep medicine

## 2024-08-18 ENCOUNTER — Ambulatory Visit: Payer: Medicare PPO | Admitting: Internal Medicine

## 2024-08-18 ENCOUNTER — Telehealth: Payer: Self-pay | Admitting: Internal Medicine

## 2024-08-18 NOTE — Telephone Encounter (Signed)
 SS order emailed to [facility]

## 2024-08-26 DIAGNOSIS — M25532 Pain in left wrist: Secondary | ICD-10-CM | POA: Diagnosis not present

## 2024-08-26 DIAGNOSIS — S63502A Unspecified sprain of left wrist, initial encounter: Secondary | ICD-10-CM | POA: Diagnosis not present

## 2024-08-30 ENCOUNTER — Other Ambulatory Visit: Payer: Self-pay | Admitting: Internal Medicine

## 2024-08-30 DIAGNOSIS — I152 Hypertension secondary to endocrine disorders: Secondary | ICD-10-CM

## 2024-08-31 NOTE — Telephone Encounter (Signed)
 Requested Prescriptions  Pending Prescriptions Disp Refills   carvedilol  (COREG ) 3.125 MG tablet [Pharmacy Med Name: CARVEDILOL  3.125 MG TABLET] 180 tablet 1    Sig: TAKE 1 TABLET BY MOUTH TWICE A DAY WITH A MEAL     Cardiovascular: Beta Blockers 3 Failed - 08/31/2024  5:04 PM      Failed - Cr in normal range and within 360 days    Creat  Date Value Ref Range Status  05/20/2024 1.14 (H) 0.60 - 1.00 mg/dL Final   Creatinine, Urine  Date Value Ref Range Status  05/20/2024 48 20 - 275 mg/dL Final         Passed - AST in normal range and within 360 days    AST  Date Value Ref Range Status  05/20/2024 21 10 - 35 U/L Final         Passed - ALT in normal range and within 360 days    ALT  Date Value Ref Range Status  05/20/2024 17 6 - 29 U/L Final         Passed - Last BP in normal range    BP Readings from Last 1 Encounters:  08/17/24 133/60         Passed - Last Heart Rate in normal range    Pulse Readings from Last 1 Encounters:  08/17/24 94         Passed - Valid encounter within last 6 months    Recent Outpatient Visits           3 months ago Dyslipidemia associated with type 2 diabetes mellitus [facility])   [facility] Glenard Mire, MD   7 months ago Dyslipidemia associated with type 2 diabetes mellitus [facility])   [facility] Bernardo Fend, DO       Future Appointments             In 3 weeks Sowles, Krichna, MD [facility], [place]

## 2024-09-05 ENCOUNTER — Other Ambulatory Visit: Payer: Self-pay | Admitting: Family Medicine

## 2024-09-05 DIAGNOSIS — I10 Essential (primary) hypertension: Secondary | ICD-10-CM

## 2024-09-06 ENCOUNTER — Other Ambulatory Visit: Payer: Self-pay | Admitting: Family Medicine

## 2024-09-06 DIAGNOSIS — I69359 Hemiplegia and hemiparesis following cerebral infarction affecting unspecified side: Secondary | ICD-10-CM

## 2024-09-09 NOTE — Telephone Encounter (Signed)
 Requested Prescriptions  Pending Prescriptions Disp Refills   olmesartan -hydrochlorothiazide  (BENICAR  HCT) 40-25 MG tablet [Pharmacy Med Name: OLMESARTAN -HCTZ 40-25 MG TAB] 90 tablet 0    Sig: TAKE 1 TABLET BY MOUTH EVERY DAY     Cardiovascular: ARB + Diuretic Combos Failed - 09/09/2024 11:03 AM      Failed - Cr in normal range and within 180 days    Creat  Date Value Ref Range Status  05/20/2024 1.14 (H) 0.60 - 1.00 mg/dL Final   Creatinine, Urine  Date Value Ref Range Status  05/20/2024 48 20 - 275 mg/dL Final         Passed - K in normal range and within 180 days    Potassium  Date Value Ref Range Status  05/20/2024 3.9 3.5 - 5.3 mmol/L Final         Passed - Na in normal range and within 180 days    Sodium  Date Value Ref Range Status  05/20/2024 145 135 - 146 mmol/L Final  01/31/2016 138 134 - 144 mmol/L Final         Passed - eGFR is 10 or above and within 180 days    GFR, Est African American  Date Value Ref Range Status  03/07/2021 45 (L) > OR = 60 mL/min/1.73m2 Final   GFR, Est Non African American  Date Value Ref Range Status  03/07/2021 38 (L) > OR = 60 mL/min/1.87m2 Final   eGFR  Date Value Ref Range Status  05/20/2024 52 (L) > OR = 60 mL/min/1.17m2 Final         Passed - Patient is not pregnant      Passed - Last BP in normal range    BP Readings from Last 1 Encounters:  08/17/24 133/60         Passed - Valid encounter within last 6 months    Recent Outpatient Visits           3 months ago Dyslipidemia associated with type 2 diabetes mellitus [facility])   [facility] Glenard Mire, MD   7 months ago Dyslipidemia associated with type 2 diabetes mellitus [facility])   [facility] Bernardo Fend, DO       Future Appointments             In 1 week Sowles, Krichna, MD [facility], [place]             amLODipine  (NORVASC ) 5 MG tablet [Pharmacy Med Name:  AMLODIPINE  BESYLATE 5 MG TAB] 90 tablet 0    Sig: TAKE 1 TABLET (5 MG TOTAL) BY MOUTH DAILY.     Cardiovascular: Calcium  Channel Blockers 2 Passed - 09/09/2024 11:03 AM      Passed - Last BP in normal range    BP Readings from Last 1 Encounters:  08/17/24 133/60         Passed - Last Heart Rate in normal range    Pulse Readings from Last 1 Encounters:  08/17/24 94         Passed - Valid encounter within last 6 months    Recent Outpatient Visits           3 months ago Dyslipidemia associated with type 2 diabetes mellitus [facility])   [facility] Glenard Mire, MD   7 months ago Dyslipidemia associated with type 2 diabetes mellitus [facility])   [facility] Bernardo Fend, [place]  Future Appointments             In 1 week Sowles, Krichna, MD [facility], [place]

## 2024-09-09 NOTE — Telephone Encounter (Signed)
 Requested Prescriptions  Pending Prescriptions Disp Refills   clopidogrel  (PLAVIX ) 75 MG tablet [Pharmacy Med Name: CLOPIDOGREL  75 MG TABLET] 90 tablet 0    Sig: TAKE 1 TABLET BY MOUTH EVERY DAY     Hematology: Antiplatelets - clopidogrel  Failed - 09/09/2024  3:29 PM      Failed - HCT in normal range and within 180 days    HCT  Date Value Ref Range Status  05/20/2024 32.4 (L) 35.0 - 45.0 % Final         Failed - HGB in normal range and within 180 days    Hemoglobin  Date Value Ref Range Status  05/20/2024 10.7 (L) 11.7 - 15.5 g/dL Final         Failed - Cr in normal range and within 360 days    Creat  Date Value Ref Range Status  05/20/2024 1.14 (H) 0.60 - 1.00 mg/dL Final   Creatinine, Urine  Date Value Ref Range Status  05/20/2024 48 20 - 275 mg/dL Final         Passed - PLT in normal range and within 180 days    Platelets  Date Value Ref Range Status  05/20/2024 277 140 - 400 Thousand/uL Final         Passed - Valid encounter within last 6 months    Recent Outpatient Visits           3 months ago Dyslipidemia associated with type 2 diabetes mellitus [facility])   [facility] Glenard Mire, MD   7 months ago Dyslipidemia associated with type 2 diabetes mellitus [facility])   [place] [facility] Bernardo Fend, DO       Future Appointments             In 1 week Sowles, Krichna, MD [facility], [place]

## 2024-09-10 DIAGNOSIS — E113312 Type 2 diabetes mellitus with moderate nonproliferative diabetic retinopathy with macular edema, left eye: Secondary | ICD-10-CM | POA: Diagnosis not present

## 2024-09-11 ENCOUNTER — Encounter (INDEPENDENT_AMBULATORY_CARE_PROVIDER_SITE_OTHER): Admitting: Internal Medicine

## 2024-09-18 ENCOUNTER — Telehealth: Payer: Self-pay

## 2024-09-18 DIAGNOSIS — I69359 Hemiplegia and hemiparesis following cerebral infarction affecting unspecified side: Secondary | ICD-10-CM

## 2024-09-18 MED ORDER — ATORVASTATIN CALCIUM 80 MG PO TABS: ORAL_TABLET | ORAL | 0 refills | Status: DC

## 2024-09-18 NOTE — Telephone Encounter (Signed)
 Pharmacy Quality Measure Review  This patient is appearing on a report for being at risk of failing the adherence measure for cholesterol (statin) medications this calendar year.   Medication: atorvastatin  80 mg  Last fill date: 05/01/24 for 90 day supply  No refills left at the pharmacy at this time. Will collaborate with provider to facilitate refill needs.   Dakisha Schoof E. Marsh, PharmD, CPP Clinical Pharmacist [facility] [phone]

## 2024-09-21 ENCOUNTER — Encounter: Payer: Self-pay | Admitting: Family Medicine

## 2024-09-21 ENCOUNTER — Ambulatory Visit: Admitting: Family Medicine

## 2024-09-21 VITALS — BP 144/80 | HR 87 | Resp 18 | Ht 64.0 in | Wt 211.7 lb

## 2024-09-21 DIAGNOSIS — I152 Hypertension secondary to endocrine disorders: Secondary | ICD-10-CM

## 2024-09-21 DIAGNOSIS — Z23 Encounter for immunization: Secondary | ICD-10-CM

## 2024-09-21 DIAGNOSIS — M25532 Pain in left wrist: Secondary | ICD-10-CM | POA: Diagnosis not present

## 2024-09-21 DIAGNOSIS — I129 Hypertensive chronic kidney disease with stage 1 through stage 4 chronic kidney disease, or unspecified chronic kidney disease: Secondary | ICD-10-CM

## 2024-09-21 DIAGNOSIS — N1831 Chronic kidney disease, stage 3a: Secondary | ICD-10-CM

## 2024-09-21 DIAGNOSIS — I69359 Hemiplegia and hemiparesis following cerebral infarction affecting unspecified side: Secondary | ICD-10-CM | POA: Insufficient documentation

## 2024-09-21 DIAGNOSIS — E1129 Type 2 diabetes mellitus with other diabetic kidney complication: Secondary | ICD-10-CM | POA: Diagnosis not present

## 2024-09-21 DIAGNOSIS — G4733 Obstructive sleep apnea (adult) (pediatric): Secondary | ICD-10-CM | POA: Diagnosis not present

## 2024-09-21 DIAGNOSIS — Z794 Long term (current) use of insulin: Secondary | ICD-10-CM

## 2024-09-21 DIAGNOSIS — E1169 Type 2 diabetes mellitus with other specified complication: Secondary | ICD-10-CM

## 2024-09-21 DIAGNOSIS — G8929 Other chronic pain: Secondary | ICD-10-CM

## 2024-09-21 DIAGNOSIS — E1159 Type 2 diabetes mellitus with other circulatory complications: Secondary | ICD-10-CM

## 2024-09-21 DIAGNOSIS — I10 Essential (primary) hypertension: Secondary | ICD-10-CM

## 2024-09-21 LAB — POCT GLYCOSYLATED HEMOGLOBIN (HGB A1C): Hemoglobin A1C: 6.2 % — AB (ref 4.0–5.6)

## 2024-09-21 MED ORDER — ATORVASTATIN CALCIUM 80 MG PO TABS: 80.0000 mg | ORAL_TABLET | Freq: Every day | ORAL | 1 refills | Status: AC

## 2024-09-21 MED ORDER — RYBELSUS 14 MG PO TABS: 1.0000 | ORAL_TABLET | Freq: Every day | ORAL | 1 refills | Status: AC

## 2024-09-21 MED ORDER — CARVEDILOL 3.125 MG PO TABS: 3.1250 mg | ORAL_TABLET | Freq: Two times a day (BID) | ORAL | 1 refills | Status: AC

## 2024-09-21 MED ORDER — AMLODIPINE BESYLATE 5 MG PO TABS: 5.0000 mg | ORAL_TABLET | Freq: Every day | ORAL | 1 refills | Status: AC

## 2024-09-21 MED ORDER — OLMESARTAN MEDOXOMIL-HCTZ 40-25 MG PO TABS: 1.0000 | ORAL_TABLET | Freq: Every day | ORAL | 1 refills | Status: AC

## 2024-09-21 MED ORDER — CLOPIDOGREL BISULFATE 75 MG PO TABS: 75.0000 mg | ORAL_TABLET | Freq: Every day | ORAL | 1 refills | Status: AC

## 2024-09-21 MED ORDER — METFORMIN HCL ER 750 MG PO TB24: 1500.0000 mg | ORAL_TABLET | Freq: Every day | ORAL | 1 refills | Status: AC

## 2024-09-21 NOTE — Progress Notes (Signed)
 Name: Meagan [name]   MRN: [id number]    DOB: November 15, 1950   Date:09/21/2024       Progress Note  Subjective  Chief Complaint  Chief Complaint  Patient presents with   Medical Management of Chronic Issues   Discussed the use of AI scribe software for clinical note transcription with the patient, who gave verbal consent to proceed.  History of Present Illness Meagan [name] is a 73 year old female who presents with chronic left wrist pain. Her seven-month-old grandchild is in the car.  Chronic left wrist pain began in October after carrying packages down the steps at her daughter's house. She felt a 'snap and pull' in her wrist but did not fall. An x-ray at urgent care showed no fracture. She has been using a wrist brace but reports ongoing pain, especially with movement. No recent trauma to the wrist.  Her blood pressure at home has been well-controlled, ranging from 112/60 to 139/70. However, she experienced a high reading of 158/86 today, which she attributes to recent activity without her cane. She is on amlodipine , carvedilol , and olmesartan /hydrochlorothiazide   for hypertension.  She has a history of diabetes, currently managed with metformin , Actos   and Rybelsus . Her A1c has improved from 6.7 in August to 6.2. She has stopped taking Toujeo  and due to some episodes of hypoglycemia also stopped metformin  recently. She is still taking Rybelsus  and Actos .  She has gained 12 lbs since last visit back in  August, which she attributes to increased carbohydrate intake  She has chronic kidney disease stage 3A with a history of proteinuria, which has improved. She is on atorvastatin  for dyslipidemia, with good control of triglycerides and LDL levels.  She has a history of a stroke, resulting in left-sided weakness, for which she uses a cane. She engages in daily exercises to maintain strength.  She uses a CPAP machine for sleep apnea and recently underwent a sleep study. She is compliant with her  CPAP therapy.  She has hypertensive retinopathy and sees an eye specialist every 12 weeks. She uses Brimonidine eye drops as prescribed by her retina specialist.    Patient Active Problem List   Diagnosis Date Noted   Benign hypertension with stage 3a chronic kidney disease (HCC) 09/21/2024   Hemiparesis affecting nondominant side as late effect of cerebrovascular accident (CVA) (HCC) 09/21/2024   History of colonic polyps 01/04/2023   Adenomatous polyp of colon 01/04/2023   Anemia in chronic kidney disease 12/21/2021   Chronic kidney disease, stage 3a (HCC) 12/21/2021   History of CVA with residual deficit 07/01/2019   Morbid obesity (HCC) 10/22/2018   Dyslipidemia associated with type 2 diabetes mellitus (HCC) 12/04/2016   Controlled type 2 diabetes mellitus with microalbuminuria (HCC) 12/04/2016   Seasonal allergies 02/01/2016   OSA (obstructive sleep apnea) 10/06/2015   Hypertensive retinopathy 10/06/2015   Thyroid  cyst 10/06/2015   Controlled type 2 diabetes with neuropathy (HCC) 10/06/2015   History of pneumonia 10/06/2015   Chronic neck pain 10/06/2015   Bilateral carotid artery disease, unspecified type    Hypertension, benign     Past Surgical History:  Procedure Laterality Date   APPENDECTOMY  2000s   BIOPSY THYROID   2006   [facility]   BREAST EXCISIONAL BIOPSY Left 2000s   neg   CARPAL TUNNEL RELEASE Left 12/2013   CATARACT EXTRACTION, BILATERAL Bilateral    Right 12/14/21 and Left 01/04/22   COLONOSCOPY  2011   [facility]   COLONOSCOPY WITH PROPOFOL  N/A 07/11/2017  Procedure: COLONOSCOPY WITH PROPOFOL ;  Surgeon: Therisa Bi, MD;  Location: [facility] ENDOSCOPY;  Service: Gastroenterology;  Laterality: N/A;   COLONOSCOPY WITH PROPOFOL  N/A 01/04/2023   Procedure: COLONOSCOPY WITH PROPOFOL ;  Surgeon: Therisa Bi, MD;  Location: [facility] ENDOSCOPY;  Service: Gastroenterology;  Laterality: N/A;   EYE SURGERY     ganglion cyst removal      KNEE SURGERY Left 08/2009   KNEE SURGERY Right  04/07/2010   REFRACTIVE SURGERY Bilateral    ROTATOR CUFF REPAIR Right 2008   TUBAL LIGATION      Family History  Problem Relation Age of Onset   Breast cancer Maternal Aunt    Hypertension Mother    Kidney Stones Mother    Hypertension Father    Congestive Heart Failure Father    Hypertension Paternal Grandfather    Diabetes Sister    Vision loss Sister    Varicose Veins Sister    Hypertension Sister    Breast cancer Sister 66       Double Mastectomy   Hypertension Sister     Social History   Tobacco Use   Smoking status: Never   Smokeless tobacco: Never  Substance Use Topics   Alcohol  use: No    Current Medications[1]  Allergies[2]  I personally reviewed active problem list, medication list, allergies, family history with the patient/caregiver today.   ROS  Ten systems reviewed and is negative except as mentioned in HPI    Objective Physical Exam VITALS: BP- 158/86 MEASUREMENTS: Weight- 211.7, BMI- 35.0. CONSTITUTIONAL: Patient appears well-developed and well-nourished. No distress. HEENT: Head atraumatic, normocephalic, neck supple. CARDIOVASCULAR: Normal rate, regular rhythm and normal heart sounds. No murmur heard. No BLE edema. PULMONARY: Effort normal and breath sounds normal. Lungs clear to auscultation bilaterally. No respiratory distress. ABDOMINAL: There is no tenderness or distention. MUSCULOSKELETAL: Normal gait. Without gross motor or sensory deficit. PSYCHIATRIC: Patient has a normal mood and affect. Behavior is normal. Judgment and thought content normal. NEUROLOGICAL: Sensation symmetric and intact.  Vitals:   09/21/24 1451 09/21/24 1550  BP: (!) 158/86 (!) 144/80  Pulse: 87   Resp: 18   SpO2: 98%   Weight: 211 lb 11.2 oz (96 kg)   Height: 5' 4 (1.626 m)     Body mass index is 36.34 kg/m.  Recent Results (from the past 2160 hours)  POCT glycosylated hemoglobin (Hb A1C)     Status: Abnormal   Collection Time: 09/21/24  2:59 PM   Result Value Ref Range   Hemoglobin A1C 6.2 (A) 4.0 - 5.6 %   HbA1c POC (<> result, manual entry)     HbA1c, POC (prediabetic range)     HbA1c, POC (controlled diabetic range)      Diabetic Foot Exam:     PHQ2/9:    09/21/2024    2:46 PM 05/20/2024    2:18 PM 11/28/2023    3:59 PM 08/30/2023    1:13 PM 04/05/2023    9:02 AM  Depression screen PHQ 2/9  Decreased Interest 0 0 0 0 0  Down, Depressed, Hopeless 0 0 0 0 0  PHQ - 2 Score 0 0 0 0 0  Altered sleeping   0 0 0  Tired, decreased energy   0 0 0  Change in appetite   0 0 0  Feeling bad or failure about yourself    0 0 0  Trouble concentrating   0 0 0  Moving slowly or fidgety/restless   0 0 0  Suicidal thoughts  0 0 0  PHQ-9 Score   0  0  0   Difficult doing work/chores   Not difficult at all       Data saved with a previous flowsheet row definition    phq 9 is negative  Fall Risk:    09/21/2024    2:46 PM 05/21/2024    3:23 PM 05/20/2024    2:18 PM 11/28/2023    4:03 PM 08/30/2023    1:13 PM  Fall Risk   Falls in the past year? 0 0 0 0 0  Number falls in past yr: 0 0 0 0 0  Injury with Fall? 0 0  0  0  0   Risk for fall due to : No Fall Risks No Fall Risks No Fall Risks No Fall Risks No Fall Risks  Follow up Falls evaluation completed Falls evaluation completed Falls evaluation completed Falls prevention discussed;Falls evaluation completed Falls prevention discussed     Data saved with a previous flowsheet row definition      Assessment & Plan Chronic left wrist pain Persistent pain with movement, likely soft tissue injury. X-ray negative for fracture. - Ordered MRI of left wrist. - Referred to hand specialist.  Type 2 diabetes mellitus with stage 3a chronic kidney disease , HTN and dyslipidemia A1c improved to 6.2. Weight gain likely due to pioglitazone . Blood glucose well-controlled. Proteinuria reduced. Dyslipidemia with low HDL, LDL and triglycerides controlled. - Discontinued pioglitazone  15  mg. - Resumed metformin  750 mg daily. - Continue Rybelsus  14 mg daily. - Monitor blood glucose; if >130 mg/dL, take additional metformin . - Continue atorvastatin  80 mg daily.  Hypertensive chronic kidney disease stage 3a Blood pressure well-controlled with current medications. No recent cardiovascular symptoms. - Continue amlodipine , carvedilol , and olmesartan  hydrochlorothiazide  . - Rechecked blood pressure before leaving clinic.  Morbid obesity BMI over 35, increased by 12 pounds since August - Encouraged weight loss through dietary changes and increased physical activity. - Advised to avoid excessive carbohydrate intake. - Stop Actos  - Resume Metformin    Hemiparesis following cerebral infarction Residual left-sided weakness. Uses cane and performs regular physical therapy. - Continue physical therapy exercises. - Encouraged daily exercise.  Obstructive sleep apnea Awaiting results from recent sleep study. - Await results of recent sleep study.  General health maintenance Due for flu shot. Previous adverse reaction to pneumonia vaccine, but flu shot safe. - Administered flu shot. - Performed foot exam.        [1]  Current Outpatient Medications:    Blood Glucose Monitoring Suppl (ACCU-CHEK GUIDE ME) w/Device KIT, CHECK IN THE MORNING AND AT BEDTIME. CHECK TWICE DAILY ICD-10 E10.9, E11.9, Disp: 1 kit, Rfl: 0   brimonidine (ALPHAGAN) 0.2 % ophthalmic solution, 1 drop 2 (two) times daily., Disp: , Rfl:    docusate sodium  (COLACE) 100 MG capsule, TAKE 1 CAPSULE (100 MG TOTAL) BY MOUTH DAILY AS NEEDED FOR MILD CONSTIPATION., Disp: 60 capsule, Rfl: 0   dorzolamide-timolol (COSOPT) 22.3-6.8 MG/ML ophthalmic solution, SMARTSIG:In Eye(s), Disp: , Rfl:    EMBECTA PEN NEEDLE NANO 2 GEN 32G X 4 MM MISC, USE AS DIRECTED, Disp: 100 each, Rfl: 1   fluticasone  (FLONASE ) 50 MCG/ACT nasal spray, Place 2 sprays into both nostrils daily., Disp: 16 g, Rfl: 6   Glucose Blood (BLOOD GLUCOSE  TEST STRIPS) STRP, CHECK IN THE MORNING AND AT BEDTIME. CHECK TWICE DAILY ICD-10 E10.9, E11.9 May substitute to any manufacturer covered by patient's insurance. Patient states Accu-Chek per insurance, Disp: 100 strip, Rfl:  12   levocetirizine (XYZAL ) 5 MG tablet, TAKE 1 TABLET BY MOUTH DAILY AS NEEDED FOR ALLERGIES., Disp: 90 tablet, Rfl: 0   polyethylene glycol (MIRALAX  / GLYCOLAX ) 17 g packet, Take 17 g by mouth daily., Disp: 14 each, Rfl: 0   amLODipine  (NORVASC ) 5 MG tablet, Take 1 tablet (5 mg total) by mouth daily., Disp: 90 tablet, Rfl: 1   atorvastatin  (LIPITOR ) 80 MG tablet, Take 1 tablet (80 mg total) by mouth daily., Disp: 90 tablet, Rfl: 1   carvedilol  (COREG ) 3.125 MG tablet, Take 1 tablet (3.125 mg total) by mouth 2 (two) times daily with a meal., Disp: 180 tablet, Rfl: 1   clopidogrel  (PLAVIX ) 75 MG tablet, Take 1 tablet (75 mg total) by mouth daily., Disp: 90 tablet, Rfl: 1   metFORMIN  (GLUCOPHAGE -XR) 750 MG 24 hr tablet, Take 2 tablets (1,500 mg total) by mouth daily with breakfast., Disp: 180 tablet, Rfl: 1   olmesartan -hydrochlorothiazide  (BENICAR  HCT) 40-25 MG tablet, Take 1 tablet by mouth daily., Disp: 90 tablet, Rfl: 1   Semaglutide  (RYBELSUS ) 14 MG TABS, Take 1 tablet (14 mg total) by mouth daily., Disp: 90 tablet, Rfl: 1 [2]  Allergies Allergen Reactions   Jardiance  [Empagliflozin ] Rash   Amoxicillin     rash   Bee Venom Swelling and Other (See Comments)    Immediately takes Benadryl  to counteract   Trulicity  [Dulaglutide ] Hives   Farxiga  [Dapagliflozin ] Rash    Pt reports that she developed a rash when she took Farxiga  and she would like it to be added as an allergy   Keflex [Cephalexin] Rash

## 2024-09-21 NOTE — Patient Instructions (Signed)
 Resume Metformin  750 mg daily and stop Pioglitazone  15 mg, continue Rybelsus  14 mg for DM Monitor glucose and if glucose goes above 130 without Pioglitazone  15 mg take one extra metformin  daily

## 2024-09-22 ENCOUNTER — Other Ambulatory Visit: Payer: Self-pay | Admitting: Family Medicine

## 2024-09-22 ENCOUNTER — Telehealth: Payer: Self-pay

## 2024-09-22 DIAGNOSIS — G8929 Other chronic pain: Secondary | ICD-10-CM

## 2024-09-22 NOTE — Procedures (Signed)
 [facility]  Polysomnogram Report Part I                                                               Phone: [phone] Fax: [phone]  Patient Name: Meagan [name], Meagan [name] Acquisition Number: [id number]  Date of Birth: 02-09-1951 Acquisition Date: 09/11/2024  Referring Physician: Elfreda Bathe, MD     History: The patient is a 73 year old  who was referred for evaluation of . Medical History: Cellulitis, diabetes mellitus without complication,diffuse cystic mastopathy, prone to falling, hypertension, occlusion and stenosis of carotid artery without mention of cerebral infarction, OSA, stroke.  Medications: amlodipine , atorvastatin , blood glucose monitoring, carvedilol , clopidogrel , docusate sodium , dorzolamide-timolol, Embecta Pen Needle, fluticasone , glucose blood, levocetirizine, metformin , olmesartan -hydrochlorothiazide , pioglitazone , polyethylene glycol, semaglutide .  Procedure: This routine overnight polysomnogram was performed on the [facility] 5 using the standard diagnostic protocol. This included 6 channels of EEG, 2 channels of EOG, chin EMG, bilateral anterior tibialis EMG, nasal/oral thermistor, PTAF (nasal pressure transducer), chest and abdominal wall movements, EKG, and pulse oximetry.  Description: The total recording time was 409.5 minutes. The total sleep time was 333.5 minutes. There were a total of 73.5 minutes of wakefulness after sleep onset for areducedsleep efficiency of 81.4%. The latency to sleep onset was shortat 2.5 minutes. The R sleep onset latency was within normal limits at 79.5 minutes. Sleep parameters, as a percentage of the total sleep time, demonstrated 11.8% of sleep was in N1 sleep, 59.4% N2, 0.4% N3 and 28.3% R sleep. There were a total of 53 arousals for an arousal index of 9.5 arousals per hour of sleep that was normal.  Respiratory monitoring demonstrated   snoring . There were 25 apneas and hypopneas for an Apnea Hypopnea Index of 4.5 apneas and hypopneas  per hour of sleep. The REM related apnea hypopnea index was 12.1/hr of REM sleep compared to a NREM AHI of 1.5/hr.  The average duration of the respiratory events was 21.1 seconds with a maximum duration of 33.0 seconds. The respiratory events occurred . The respiratory events were associated with peripheral oxygen desaturations on the average to 92%. The lowest oxygen desaturation associated with a respiratory event was 81%. Additionally, the baseline oxygen saturation during wakefulness was 99%, during NREM sleep averaged 97%, and during REM sleep averaged 97%. The total duration of oxygen < 90% was 1.0 minutes.  Cardiac monitoring-  demonstrate transient cardiac decelerations associated with the apneas.  significant cardiac rhythm irregularities.   Periodic limb movement monitoring- did not demonstrate periodic limb movements.   Impression: This routine overnight polysomnogram demonstrated REM dependent obstructive sleep apnea with an Apnea Hypopnea Index of 12.1 apneas and hypopneas during REM sleep. However, the overall Apnea Hypopnea Index was low, 4.5/hr. The lowest desaturation was to 81%. As the patient is using CPAP regularly at home, the findings likely underestimate the severity of the sleep apnea.  reduced sleep efficiency withincreased awakeningsand virtually no slow wave sleep. These findings would appear to be due to the increased upper airway resistance.  Recommendations:     Would recommend a repeat study after withholding CPAP for a minimum of 4 nights. weight loss in a patient with a BMI of 36.6.  Under current guidelines, this patient would not qualify for CPAP coverage due to  the overall low apnea hypopnea index.     Elfreda RONAL Bathe, MD, [facility] Diplomate ABMS-Pulmonary, Critical Care and Sleep Medicine  Electronically reviewed and digitally signed  [facility] Polysomn[facility] Report Part II  Phone: [phone] Fax: [phone]  Patient last name [name]  Neck Size 14.0 in. Acquisition [id number]  Patient first name Meagan Weight 213.0 lbs. Started 09/11/2024 at 10:24:01 PM  Birth date [birth year] Height 64.0 in. Stopped 09/12/2024 at 5:28:31 AM  Age 19 BMI 36.6 lb/in2 Duration 409.5  Study Type Adult      Raford Sprang. RPSGT. // Daril Burr   Reviewed by: Marval MATSU. Henke, PhD, ABSM, FAASM Sleep Data: Lights Out: 10:36:01 PM Sleep Onset: 10:38:31 PM  Lights On: 5:25:31 AM Sleep Efficiency: 81.4 %  Total Recording Time: 409.5 min Sleep Latency (from Lights Off) 2.5 min  Total Sleep Time (TST): 333.5 min R Latency (from Sleep Onset): 79.5 min  Sleep Period Time: 406.5 min Total number of awakenings: 26  Wake during sleep: 73.0 min Wake After Sleep Onset (WASO): 73.5 min   Sleep Data:         Arousal Summary: Stage  Latency from lights out (min) Latency from sleep onset (min) Duration (min) % Total Sleep Time  Normal values  N 1 2.5 0.0 39.5 11.8 (5%)  N 2 4.0 1.5 198.0 59.4 (50%)  N 3 30.5 28.0 1.5 0.4 (20%)  R 82.0 79.5 94.5 28.3 (25%)   Number Index  Spontaneous 55 9.9  Apneas & Hypopneas 1 0.2  RERAs 0 0.0       (Apneas & Hypopneas & RERAs)  (1) (0.2)  Limb Movement 0 0.0  Snore 0 0.0  TOTAL 56 10.1     Respiratory Data:  CA OA MA Apnea Hypopnea* A+ H RERA Total  Number 0 2 0 2 23 25  0 25  Mean Dur (sec) 0.0 17.3 0.0 17.3 21.4 21.1 0.0 21.1  Max Dur (sec) 0.0 17.5 0.0 17.5 33.0 33.0 0.0 33.0  Total Dur (min) 0.0 0.6 0.0 0.6 8.2 8.8 0.0 8.8  % of TST 0.0 0.2 0.0 0.2 2.5 2.6 0.0 2.6  Index (#/h TST) 0.0 0.4 0.0 0.4 4.1 4.5 0.0 4.5  *Hypopneas scored based on 4% or greater desaturation.  Sleep Stage:        REM NREM TST  AHI 12.1 1.5 4.5  RDI 12.1 1.5 4.5           Body Position Data:  Sleep (min) TST (%) REM (min) NREM (min) CA (#) OA (#) MA (#) HYP (#) AHI (#/h) RERA (#) RDI (#/h) Desat (#)  Supine 126.5 37.93 10.0 116.5 0 1 0 5 2.8 0 2.8 10  Non-Supine 207.00 62.07 84.50 122.50 0.00 1.00 0.00 18.00  5.51 0 5.51 31.00  Left: 207.0 62.07 84.5 122.5 0 1 0 18 5.5 0 5.5 31     Snoring: Total number of snoring episodes  0  Total time with snoring    min (   % of sleep)   Oximetry Distribution:             WK REM NREM TOTAL  Average (%)   99 97 97 97  < 90% 0.0 1.0 0.0 1.0  < 80% 0.0 0.0 0.0 0.0  < 70% 0.0 0.0 0.0 0.0  # of Desaturations* 1 34 6 41  Desat Index (#/hour) 0.9 21.6 1.5 7.4  Desat Max (%) 5 14 12 14   Desat Max Dur (sec) 15.0  100.0 44.0 100.0  Approx Min O2 during sleep 81  Approx min O2 during a respiratory event 81  Was Oxygen added (Y/N) and final rate :    LPM  *Desaturations based on 4% or greater drop from baseline.   Cheyne Stokes Breathing: None Present   Heart Rate Summary:  Average Heart Rate During Sleep 73.5 bpm      Highest Heart Rate During Sleep (95th %) 77.0 bpm      Highest Heart Rate During Sleep 175 bpm (artifact)  Highest Heart Rate During Recording (TIB) 209 bpm (artifact)   Heart Rate Observations: Event Type # Events   Bradycardia 0 Lowest HR Scored: N/A  Sinus Tachycardia During Sleep 0 Highest HR Scored: N/A  Narrow Complex Tachycardia 0 Highest HR Scored: N/A  Wide Complex Tachycardia 0 Highest HR Scored: N/A  Asystole 0 Longest Pause: N/A  Atrial Fibrillation 0 Duration Longest Event: N/A  Other Arrythmias   Type:    Periodic Limb Movement Data: (Primary legs unless otherwise noted) Total # Limb Movement 0 Limb Movement Index 0.0  Total # PLMS    PLMS Index     Total # PLMS Arousals    PLMS Arousal Index     Percentage Sleep Time with PLMS   min (   % sleep)  Mean Duration limb movements (secs)

## 2024-09-22 NOTE — Telephone Encounter (Signed)
 Pt states she still wants to do MRI and still get referred to Ortho local. I gave pt scheduling number [phone] to schedule MRI.   Advised pt bp second reading was documented and it was not on AVS due to being printed before documenting it in system.

## 2024-09-22 NOTE — Telephone Encounter (Signed)
 Copied from CRM #[id number]. Topic: General - Other >> Sep 21, 2024  4:53 PM Meagan [name] wrote: Reason for CRM: Patient is calling to report corrections to AVS from office visit today blood pressure was retaken toward the end of visit - and BP 144/80. Blood Pressure not documented.   Patient also requesting a referral to ortho for left wrist.

## 2024-09-22 NOTE — Telephone Encounter (Signed)
 BP second reading was documented but AVS was printed before the second reading was documented.  Regards the referral would you place it?

## 2024-09-25 ENCOUNTER — Ambulatory Visit: Admitting: Family Medicine

## 2024-09-29 ENCOUNTER — Other Ambulatory Visit: Payer: Self-pay

## 2024-09-29 DIAGNOSIS — G8929 Other chronic pain: Secondary | ICD-10-CM

## 2024-10-02 ENCOUNTER — Ambulatory Visit

## 2024-10-05 ENCOUNTER — Ambulatory Visit
Admission: RE | Admit: 2024-10-05 | Discharge: 2024-10-05 | Disposition: A | Source: Ambulatory Visit | Attending: Family Medicine | Admitting: Family Medicine

## 2024-10-05 DIAGNOSIS — M25532 Pain in left wrist: Secondary | ICD-10-CM | POA: Diagnosis present

## 2024-10-05 DIAGNOSIS — G8929 Other chronic pain: Secondary | ICD-10-CM | POA: Insufficient documentation

## 2024-10-12 ENCOUNTER — Ambulatory Visit: Admitting: Internal Medicine

## 2024-10-16 ENCOUNTER — Ambulatory Visit: Payer: Self-pay | Admitting: Family Medicine

## 2024-10-19 ENCOUNTER — Encounter: Payer: Self-pay | Admitting: Internal Medicine

## 2024-10-19 ENCOUNTER — Ambulatory Visit: Admitting: Internal Medicine

## 2024-10-19 VITALS — BP 140/78 | HR 81 | Temp 98.0°F | Resp 16 | Ht 64.0 in | Wt 210.4 lb

## 2024-10-19 DIAGNOSIS — Z7189 Other specified counseling: Secondary | ICD-10-CM

## 2024-10-19 DIAGNOSIS — G4733 Obstructive sleep apnea (adult) (pediatric): Secondary | ICD-10-CM | POA: Diagnosis not present

## 2024-10-19 NOTE — Progress Notes (Unsigned)
 [facility] [address] 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 The overall AHI was 4.5 per hour she is using her machine and it helps her well. She states she has gained weight recently. She states about 12 pounds. Patient states she is working on losing weight. Denies having chest pain etc  Office Spirometry Results:     ROS  General: (-) fever, (-) chills, (-) night sweats, (-) weakness Skin: (-) rashes, (-) itching,. Eyes: (-) visual changes, (-) redness, (-) itching. Nose and Sinuses: (-) nasal stuffiness or itchiness, (-) postnasal drip, (-) nosebleeds, (-) sinus trouble. Mouth and Throat: (-) sore throat, (-) hoarseness. Neck: (-) swollen glands, (-) enlarged thyroid , (-) neck pain. Respiratory: - cough, (-) bloody sputum, - shortness of breath, - wheezing. Cardiovascular: - ankle swelling, (-) chest pain. Lymphatic: (-) lymph node enlargement. Neurologic: (-) numbness, (-) tingling. Psychiatric: (-) anxiety, (-) depression   Current Medication: Outpatient Encounter Medications as of 10/19/2024  Medication Sig Note   amLODipine  (NORVASC ) 5 MG tablet Take 1 tablet (5 mg total) by mouth daily.    atorvastatin  (LIPITOR ) 80 MG tablet Take 1 tablet (80 mg total) by mouth daily.    Blood Glucose Monitoring Suppl (ACCU-CHEK GUIDE ME) w/Device KIT CHECK IN THE MORNING AND AT BEDTIME. CHECK TWICE DAILY ICD-10 E10.9, E11.9    brimonidine (ALPHAGAN) 0.2 % ophthalmic solution 1 drop 2 (two) times daily.    carvedilol  (COREG ) 3.125 MG tablet Take 1 tablet (3.125 mg total) by mouth 2 (two) times daily with a meal.    clopidogrel  (PLAVIX ) 75 MG tablet Take 1 tablet (75 mg total) by mouth daily.    docusate sodium  (COLACE) 100 MG capsule TAKE 1 CAPSULE (100  MG TOTAL) BY MOUTH DAILY AS NEEDED FOR MILD CONSTIPATION.    dorzolamide-timolol (COSOPT) 22.3-6.8 MG/ML ophthalmic solution SMARTSIG:In Eye(s)    EMBECTA PEN NEEDLE NANO 2 GEN 32G X 4 MM MISC USE AS DIRECTED    fluticasone  (FLONASE ) 50 MCG/ACT nasal spray Place 2 sprays into both nostrils daily.    Glucose Blood (BLOOD GLUCOSE TEST STRIPS) STRP CHECK IN THE MORNING AND AT BEDTIME. CHECK TWICE DAILY ICD-10 E10.9, E11.9 May substitute to any manufacturer covered by patient's insurance. Patient states Accu-Chek per insurance    levocetirizine (XYZAL ) 5 MG tablet TAKE 1 TABLET BY MOUTH DAILY AS NEEDED FOR ALLERGIES.    metFORMIN  (GLUCOPHAGE -XR) 750 MG 24 hr tablet Take 2 tablets (1,500 mg total) by mouth daily with breakfast.    olmesartan -hydrochlorothiazide  (BENICAR  HCT) 40-25 MG tablet Take 1 tablet by mouth daily.    polyethylene glycol (MIRALAX  / GLYCOLAX ) 17 g packet Take 17 g by mouth daily. 11/28/2023: TAKES PRN   Semaglutide  (RYBELSUS ) 14 MG TABS Take 1 tablet (14 mg total) by mouth daily.    No facility-administered encounter medications on file as of 10/19/2024.    Surgical History: Past Surgical History:  Procedure Laterality Date   APPENDECTOMY  2000s   BIOPSY THYROID   2006   [facility]   BREAST EXCISIONAL BIOPSY Left 2000s   neg   CARPAL TUNNEL RELEASE Left 12/2013   CATARACT EXTRACTION, BILATERAL Bilateral    Right 12/14/21 and Left 01/04/22   COLONOSCOPY  2011   [facility]   COLONOSCOPY WITH  PROPOFOL  N/A 07/11/2017   Procedure: COLONOSCOPY WITH PROPOFOL ;  Surgeon: Therisa Bi, MD;  Location[facility] ENDOSCOPY;  Service: Gastroenterology;  Laterality: N/A;   COLONOSCOPY WITH PROPOFOL  N/A 01/04/2023   Procedure: COLONOSCOPY WITH PROPOFOL ;  Surgeon: Therisa Bi, MD;  Location: [facility] ENDOSCOPY;  Service: Gastroenterology;  Laterality: N/A;   EYE SURGERY     ganglion cyst removal      KNEE SURGERY Left 08/2009   KNEE SURGERY Right 04/07/2010   REFRACTIVE SURGERY Bilateral    ROTATOR CUFF  REPAIR Right 2008   TUBAL LIGATION      Medical History: Past Medical History:  Diagnosis Date   Cellulitis    left leg   Diabetes mellitus without complication (HCC)    non-insulin  dependent   Diffuse cystic mastopathy    Fall 2013   Hypertension 2004   Occlusion and stenosis of carotid artery without mention of cerebral infarction    Sleep apnea    Stroke [facility])     Family History: Family History  Problem Relation Age of Onset   Breast cancer Maternal Aunt    Hypertension Mother    Kidney Stones Mother    Hypertension Father    Congestive Heart Failure Father    Hypertension Paternal Grandfather    Diabetes Sister    Vision loss Sister    Varicose Veins Sister    Hypertension Sister    Breast cancer Sister 27       Double Mastectomy   Hypertension Sister     Social History: Social History   Socioeconomic History   Marital status: Widowed    Spouse name: Not on file   Number of children: 4   Years of education: Not on file   Highest education level: Master's degree (e.g., MA, MS, MEng, MEd, MSW, MBA)  Occupational History   Occupation: primary school teacher   Tobacco Use   Smoking status: Never   Smokeless tobacco: Never  Vaping Use   Vaping status: Never Used  Substance and Sexual Activity   Alcohol  use: No   Drug use: No   Sexual activity: Not Currently    Partners: Male    Comment: Widow for 14 years  Other Topics Concern   Not on file  Social History Narrative   She had a stroke on 06/2019, she has left peripheral vision loss since stroke   She moved in with her youngest sister    Social Drivers of Health   Tobacco Use: Low Risk (10/19/2024)   Patient History    Smoking Tobacco Use: Never    Smokeless Tobacco Use: Never    Passive Exposure: Not on file  Financial Resource Strain: Low Risk (11/28/2023)   Overall Financial Resource Strain (CARDIA)    Difficulty of Paying Living Expenses: Not hard at all  Food Insecurity: No Food Insecurity (11/28/2023)    Hunger Vital Sign    Worried About Running Out of Food in the Last Year: Never true    Ran Out of Food in the Last Year: Never true  Transportation Needs: No Transportation Needs (11/28/2023)   PRAPARE - Administrator, Civil Service (Medical): No    Lack of Transportation (Non-Medical): No  Physical Activity: Sufficiently Active (11/28/2023)   Exercise Vital Sign    Days of Exercise per Week: 7 days    Minutes of Exercise per Session: 30 min  Stress: No Stress Concern Present (11/28/2023)   [employer] of Occupational Health - Occupational Stress Questionnaire    Feeling  of Stress : Not at all  Social Connections: Moderately Integrated (11/28/2023)   Social Connection and Isolation Panel    Frequency of Communication with Friends and Family: More than three times a week    Frequency of Social Gatherings with Friends and Family: More than three times a week    Attends Religious Services: More than 4 times per year    Active Member of [employer] or Organizations: Yes    Attends Banker Meetings: More than 4 times per year    Marital Status: Widowed  Intimate Partner Violence: Not At Risk (11/28/2023)   Humiliation, Afraid, Rape, and Kick questionnaire    Fear of Current or Ex-Partner: No    Emotionally Abused: No    Physically Abused: No    Sexually Abused: No  Depression (PHQ2-9): Low Risk (09/21/2024)   Depression (PHQ2-9)    PHQ-2 Score: 0  Alcohol  Screen: Low Risk (11/28/2023)   Alcohol  Screen    Last Alcohol  Screening Score (AUDIT): 0  Housing: Unknown (11/28/2023)   Housing Stability Vital Sign    Unable to Pay for Housing in the Last Year: No    Number of Times Moved in the Last Year: Not on file    Homeless in the Last Year: No  Utilities: Not At Risk (11/28/2023)   AHC Utilities    Threatened with loss of utilities: No  Health Literacy: Adequate Health Literacy (11/28/2023)   B1300 Health Literacy    Frequency of need for help with medical  instructions: Never    Vital Signs: Blood pressure (!) 140/78, pulse 81, temperature 98 F (36.7 C), resp. rate 16, height 5' 4 (1.626 m), weight 210 lb 6.4 oz (95.4 kg), SpO2 97%.  Examination: General Appearance: The patient is well-developed, well-nourished, and in no distress. Skin: Gross inspection of skin unremarkable. Head: normocephalic, no gross deformities. Eyes: no gross deformities noted. ENT: ears appear grossly normal no exudates. Neck: Supple. No thyromegaly. No LAD. Respiratory: no rhonchi noted. Cardiovascular: Normal S1 and S2 without murmur or rub. Extremities: No cyanosis. pulses are equal. Neurologic: Alert and oriented. No involuntary movements.  LABS: Recent Results (from the past 2160 hours)  POCT glycosylated hemoglobin (Hb A1C)     Status: Abnormal   Collection Time: 09/21/24  2:59 PM  Result Value Ref Range   Hemoglobin A1C 6.2 (A) 4.0 - 5.6 %   HbA1c POC (<> result, manual entry)     HbA1c, POC (prediabetic range)     HbA1c, POC (controlled diabetic range)      Radiology: MR WRIST LEFT WO CONTRAST Result Date: 10/16/2024 MR WRIST WITHOUT IV CONTRAST LEFT [facility] HISTORY: Medial left wrist pain. . TECHNIQUE: Multiplanar, multisequence MR of the left wrist was performed without intravenous contrast. COMPARISON: None available. FINDINGS: Normal osseous alignment. No fracture. Mild arthrosis of the thumb carpometacarpal joint. Cystic change in the trapezium. No significant carpal effusion. Ganglion cyst volar margin of the radius scaphoid measuring 9 x 13 x 5 mm (axial T2 fat sat image 15 and sagittal PD fat sat image 18). Flexor tendons intact. Median nerve intact. First extensor compartment tenosynovitis in tendinosis with adjacent subcutaneous edema (axial T2 fat sat images 10-20). Remaining extensor tendons intact. Scapholunate and lunotriquetral intervals are intact. TFCC intact. IMPRESSION: 1. First extensor compartment  tenosynovitis and tendinosis with adjacent subcutaneous edema (de Quervain's tenosynovitis). 2.  Mild arthrosis thumb carpometacarpal joint. 3.  Ganglion cyst volar margin of the radioscaphoid. Electronically signed by: Margretta Blanch MD  10/16/2024 04:38 PM EST RP Workstation: [id number]    No results found.  MR WRIST LEFT WO CONTRAST Result Date: 10/16/2024 MR WRIST WITHOUT IV CONTRAST LEFT [facility] HISTORY: Medial left wrist pain. . TECHNIQUE: Multiplanar, multisequence MR of the left wrist was performed without intravenous contrast. COMPARISON: None available. FINDINGS: Normal osseous alignment. No fracture. Mild arthrosis of the thumb carpometacarpal joint. Cystic change in the trapezium. No significant carpal effusion. Ganglion cyst volar margin of the radius scaphoid measuring 9 x 13 x 5 mm (axial T2 fat sat image 15 and sagittal PD fat sat image 18). Flexor tendons intact. Median nerve intact. First extensor compartment tenosynovitis in tendinosis with adjacent subcutaneous edema (axial T2 fat sat images 10-20). Remaining extensor tendons intact. Scapholunate and lunotriquetral intervals are intact. TFCC intact. IMPRESSION: 1. First extensor compartment tenosynovitis and tendinosis with adjacent subcutaneous edema (de Quervain's tenosynovitis). 2.  Mild arthrosis thumb carpometacarpal joint. 3.  Ganglion cyst volar margin of the radioscaphoid. Electronically signed by: Margretta Blanch MD 10/16/2024 04:38 PM EST RP Workstation: [id number]    Assessment and Plan: Patient Active Problem List   Diagnosis Date Noted   Benign hypertension with stage 3a chronic kidney disease (HCC) 09/21/2024   Hemiparesis affecting nondominant side as late effect of cerebrovascular accident (CVA) (HCC) 09/21/2024   History of colonic polyps 01/04/2023   Adenomatous polyp of colon 01/04/2023   Anemia in chronic kidney disease 12/21/2021   Chronic kidney disease, stage 3a (HCC) 12/21/2021   History  of CVA with residual deficit 07/01/2019   Morbid obesity (HCC) 10/22/2018   Dyslipidemia associated with type 2 diabetes mellitus (HCC) 12/04/2016   Controlled type 2 diabetes mellitus with microalbuminuria (HCC) 12/04/2016   Seasonal allergies 02/01/2016   OSA (obstructive sleep apnea) 10/06/2015   Hypertensive retinopathy 10/06/2015   Thyroid  cyst 10/06/2015   Controlled type 2 diabetes with neuropathy (HCC) 10/06/2015   History of pneumonia 10/06/2015   Chronic neck pain 10/06/2015   Bilateral carotid artery disease, unspecified type    Hypertension, benign     1. OSA (obstructive sleep apnea) (Primary) She has been using her CPAP.  The most recent study reveals her apnea hypopnea index to be 4.5/h overall.  She does however have significant increased in her REM apnea-hypopnea index to over 12.  She wants to continue to use her CPAP and I do not think there is anything wrong with that she states that it does help her to sleep at night  2. Morbid obesity (HCC) Of course she needs to do diet exercise and try to work on weight loss she states that she is going to do this.  3. CPAP use counseling Counseling provided on CPAP although the overall index would indicate that her AHI is borderline normal her REM index is significantly worse as discussed above  General Counseling: I have discussed the findings of the evaluation and examination with Maurilio.  I have also discussed any further diagnostic evaluation thatmay be needed or ordered today. Corita verbalizes understanding of the findings of todays visit. We also reviewed her medications today and discussed drug interactions and side effects including but not limited excessive drowsiness and altered mental states. We also discussed that there is always a risk not just to her but also people around her. she has been encouraged to call the office with any questions or concerns that should arise related to todays visit.  No orders of the defined  types were placed in this encounter.  Time spent: 34  I have personally obtained a history, examined the patient, evaluated laboratory and imaging results, formulated the assessment and plan and placed orders.    Elfreda DELENA Bathe, MD [facility] Pulmonary and Critical Care Sleep medicine

## 2024-10-19 NOTE — Patient Instructions (Signed)

## 2024-11-08 ENCOUNTER — Other Ambulatory Visit: Payer: Self-pay | Admitting: Family Medicine

## 2024-11-08 DIAGNOSIS — E1129 Type 2 diabetes mellitus with other diabetic kidney complication: Secondary | ICD-10-CM

## 2024-11-08 DIAGNOSIS — E1169 Type 2 diabetes mellitus with other specified complication: Secondary | ICD-10-CM

## 2024-12-03 ENCOUNTER — Ambulatory Visit: Payer: Medicare PPO

## 2025-01-22 ENCOUNTER — Ambulatory Visit: Admitting: Family Medicine

## 2025-05-03 ENCOUNTER — Ambulatory Visit: Admitting: Internal Medicine

## 2025-06-03 ENCOUNTER — Ambulatory Visit (INDEPENDENT_AMBULATORY_CARE_PROVIDER_SITE_OTHER): Admitting: Vascular Surgery

## 2025-06-03 ENCOUNTER — Encounter (INDEPENDENT_AMBULATORY_CARE_PROVIDER_SITE_OTHER)

## 2025-06-07 ENCOUNTER — Encounter (INDEPENDENT_AMBULATORY_CARE_PROVIDER_SITE_OTHER)

## 2025-06-07 ENCOUNTER — Ambulatory Visit (INDEPENDENT_AMBULATORY_CARE_PROVIDER_SITE_OTHER): Admitting: Nurse Practitioner
